# Patient Record
Sex: Male | Born: 1935 | Race: White | Hispanic: No | Smoking: Former smoker
Health system: Southern US, Community
[De-identification: ages and names within clinical notes are randomized; demographics above are authoritative.]

## PROBLEM LIST (undated history)

## (undated) DIAGNOSIS — I714 Abdominal aortic aneurysm, without rupture, unspecified: Secondary | ICD-10-CM

## (undated) DIAGNOSIS — R911 Solitary pulmonary nodule: Secondary | ICD-10-CM

## (undated) DIAGNOSIS — K219 Gastro-esophageal reflux disease without esophagitis: Secondary | ICD-10-CM

## (undated) DIAGNOSIS — N400 Enlarged prostate without lower urinary tract symptoms: Secondary | ICD-10-CM

## (undated) DIAGNOSIS — E785 Hyperlipidemia, unspecified: Secondary | ICD-10-CM

## (undated) DIAGNOSIS — I499 Cardiac arrhythmia, unspecified: Secondary | ICD-10-CM

## (undated) DIAGNOSIS — D649 Anemia, unspecified: Secondary | ICD-10-CM

## (undated) DIAGNOSIS — C4491 Basal cell carcinoma of skin, unspecified: Secondary | ICD-10-CM

## (undated) DIAGNOSIS — I2510 Atherosclerotic heart disease of native coronary artery without angina pectoris: Secondary | ICD-10-CM

## (undated) DIAGNOSIS — M1990 Unspecified osteoarthritis, unspecified site: Secondary | ICD-10-CM

## (undated) DIAGNOSIS — Z5189 Encounter for other specified aftercare: Secondary | ICD-10-CM

## (undated) DIAGNOSIS — Z950 Presence of cardiac pacemaker: Secondary | ICD-10-CM

## (undated) DIAGNOSIS — IMO0001 Reserved for inherently not codable concepts without codable children: Secondary | ICD-10-CM

## (undated) DIAGNOSIS — T4145XA Adverse effect of unspecified anesthetic, initial encounter: Secondary | ICD-10-CM

## (undated) DIAGNOSIS — Z923 Personal history of irradiation: Secondary | ICD-10-CM

## (undated) DIAGNOSIS — N2889 Other specified disorders of kidney and ureter: Secondary | ICD-10-CM

## (undated) DIAGNOSIS — K649 Unspecified hemorrhoids: Secondary | ICD-10-CM

## (undated) DIAGNOSIS — I350 Nonrheumatic aortic (valve) stenosis: Secondary | ICD-10-CM

## (undated) DIAGNOSIS — Z952 Presence of prosthetic heart valve: Secondary | ICD-10-CM

## (undated) DIAGNOSIS — K635 Polyp of colon: Secondary | ICD-10-CM

## (undated) DIAGNOSIS — T8859XA Other complications of anesthesia, initial encounter: Secondary | ICD-10-CM

## (undated) DIAGNOSIS — I10 Essential (primary) hypertension: Secondary | ICD-10-CM

## (undated) DIAGNOSIS — F32A Depression, unspecified: Secondary | ICD-10-CM

## (undated) HISTORY — DX: Personal history of irradiation: Z92.3

## (undated) HISTORY — PX: CARDIAC CATHETERIZATION: SHX172

## (undated) HISTORY — DX: Nonrheumatic aortic (valve) stenosis: I35.0

## (undated) HISTORY — DX: Basal cell carcinoma of skin, unspecified: C44.91

## (undated) HISTORY — DX: Encounter for other specified aftercare: Z51.89

## (undated) HISTORY — DX: Essential (primary) hypertension: I10

## (undated) HISTORY — DX: Atherosclerotic heart disease of native coronary artery without angina pectoris: I25.10

## (undated) HISTORY — DX: Gastro-esophageal reflux disease without esophagitis: K21.9

## (undated) HISTORY — DX: Reserved for inherently not codable concepts without codable children: IMO0001

## (undated) HISTORY — DX: Presence of cardiac pacemaker: Z95.0

## (undated) HISTORY — PX: BYPASS GRAFT ANGIOGRAPHY: CATH118229

## (undated) HISTORY — DX: Other specified disorders of kidney and ureter: N28.89

## (undated) HISTORY — DX: Depression, unspecified: F32.A

## (undated) HISTORY — DX: Unspecified osteoarthritis, unspecified site: M19.90

## (undated) HISTORY — DX: Abdominal aortic aneurysm, without rupture: I71.4

## (undated) HISTORY — DX: Hyperlipidemia, unspecified: E78.5

## (undated) HISTORY — DX: Polyp of colon: K63.5

## (undated) HISTORY — DX: Benign prostatic hyperplasia without lower urinary tract symptoms: N40.0

## (undated) HISTORY — PX: TONSILLECTOMY: SUR1361

## (undated) HISTORY — DX: Abdominal aortic aneurysm, without rupture, unspecified: I71.40

## (undated) HISTORY — DX: Solitary pulmonary nodule: R91.1

## (undated) HISTORY — DX: Anemia, unspecified: D64.9

---

## 1979-05-17 HISTORY — PX: NASAL HEMORRHAGE CONTROL: SHX287

## 1998-05-08 ENCOUNTER — Inpatient Hospital Stay (HOSPITAL_COMMUNITY): Admission: AD | Admit: 1998-05-08 | Discharge: 1998-05-10 | Payer: Self-pay | Admitting: Cardiology

## 1998-05-09 HISTORY — PX: CORONARY ANGIOPLASTY WITH STENT PLACEMENT: SHX49

## 1998-05-25 ENCOUNTER — Inpatient Hospital Stay (HOSPITAL_COMMUNITY): Admission: EM | Admit: 1998-05-25 | Discharge: 1998-05-27 | Payer: Self-pay | Admitting: Emergency Medicine

## 1998-12-07 ENCOUNTER — Ambulatory Visit (HOSPITAL_COMMUNITY): Admission: RE | Admit: 1998-12-07 | Discharge: 1998-12-08 | Payer: Self-pay | Admitting: Cardiology

## 1998-12-11 ENCOUNTER — Ambulatory Visit (HOSPITAL_COMMUNITY): Admission: RE | Admit: 1998-12-11 | Discharge: 1998-12-11 | Payer: Self-pay | Admitting: Pulmonary Disease

## 2001-01-14 ENCOUNTER — Encounter: Admission: RE | Admit: 2001-01-14 | Discharge: 2001-01-14 | Payer: Self-pay | Admitting: *Deleted

## 2002-01-08 ENCOUNTER — Emergency Department (HOSPITAL_COMMUNITY): Admission: EM | Admit: 2002-01-08 | Discharge: 2002-01-08 | Payer: Self-pay | Admitting: *Deleted

## 2002-01-08 ENCOUNTER — Encounter: Payer: Self-pay | Admitting: *Deleted

## 2002-09-15 HISTORY — PX: CARDIAC CATHETERIZATION: SHX172

## 2002-09-15 HISTORY — PX: INSERT / REPLACE / REMOVE PACEMAKER: SUR710

## 2003-04-20 ENCOUNTER — Inpatient Hospital Stay (HOSPITAL_COMMUNITY): Admission: EM | Admit: 2003-04-20 | Discharge: 2003-04-27 | Payer: Self-pay

## 2003-04-21 ENCOUNTER — Encounter: Payer: Self-pay | Admitting: Cardiology

## 2003-04-24 ENCOUNTER — Encounter: Payer: Self-pay | Admitting: Cardiology

## 2003-04-25 ENCOUNTER — Encounter: Payer: Self-pay | Admitting: Cardiology

## 2003-04-27 ENCOUNTER — Encounter: Payer: Self-pay | Admitting: Cardiology

## 2004-10-04 ENCOUNTER — Encounter: Admission: RE | Admit: 2004-10-04 | Discharge: 2004-10-04 | Payer: Self-pay | Admitting: Cardiology

## 2005-07-05 ENCOUNTER — Inpatient Hospital Stay (HOSPITAL_COMMUNITY): Admission: EM | Admit: 2005-07-05 | Discharge: 2005-07-06 | Payer: Self-pay | Admitting: Emergency Medicine

## 2006-01-14 ENCOUNTER — Encounter: Payer: Self-pay | Admitting: Emergency Medicine

## 2006-11-13 ENCOUNTER — Emergency Department (HOSPITAL_COMMUNITY): Admission: EM | Admit: 2006-11-13 | Discharge: 2006-11-13 | Payer: Self-pay | Admitting: Emergency Medicine

## 2008-05-03 ENCOUNTER — Emergency Department (HOSPITAL_COMMUNITY): Admission: EM | Admit: 2008-05-03 | Discharge: 2008-05-03 | Payer: Self-pay | Admitting: Emergency Medicine

## 2008-06-19 DIAGNOSIS — I2510 Atherosclerotic heart disease of native coronary artery without angina pectoris: Secondary | ICD-10-CM

## 2008-06-19 DIAGNOSIS — E785 Hyperlipidemia, unspecified: Secondary | ICD-10-CM

## 2008-06-19 DIAGNOSIS — D649 Anemia, unspecified: Secondary | ICD-10-CM | POA: Insufficient documentation

## 2008-06-19 DIAGNOSIS — E782 Mixed hyperlipidemia: Secondary | ICD-10-CM | POA: Insufficient documentation

## 2008-06-21 ENCOUNTER — Ambulatory Visit: Payer: Self-pay | Admitting: Internal Medicine

## 2008-06-21 DIAGNOSIS — Z860100 Personal history of colon polyps, unspecified: Secondary | ICD-10-CM | POA: Insufficient documentation

## 2008-06-21 DIAGNOSIS — R1012 Left upper quadrant pain: Secondary | ICD-10-CM | POA: Insufficient documentation

## 2008-06-21 LAB — CONVERTED CEMR LAB
Ferritin: 108.2 ng/mL (ref 22.0–322.0)
Iron: 99 ug/dL (ref 42–165)

## 2008-06-22 ENCOUNTER — Telehealth: Payer: Self-pay | Admitting: Internal Medicine

## 2008-06-27 ENCOUNTER — Ambulatory Visit: Payer: Self-pay | Admitting: Cardiovascular Disease

## 2009-02-20 ENCOUNTER — Inpatient Hospital Stay (HOSPITAL_COMMUNITY): Admission: EM | Admit: 2009-02-20 | Discharge: 2009-02-22 | Payer: Self-pay | Admitting: Emergency Medicine

## 2009-02-21 ENCOUNTER — Encounter: Payer: Self-pay | Admitting: Cardiology

## 2009-12-07 ENCOUNTER — Emergency Department (HOSPITAL_COMMUNITY)
Admission: EM | Admit: 2009-12-07 | Discharge: 2009-12-08 | Payer: Self-pay | Source: Home / Self Care | Admitting: Emergency Medicine

## 2010-04-30 ENCOUNTER — Ambulatory Visit: Payer: Self-pay | Admitting: Cardiology

## 2010-06-07 ENCOUNTER — Ambulatory Visit: Payer: Self-pay | Admitting: Cardiology

## 2010-07-20 ENCOUNTER — Encounter (INDEPENDENT_AMBULATORY_CARE_PROVIDER_SITE_OTHER): Payer: Self-pay | Admitting: *Deleted

## 2010-07-29 ENCOUNTER — Encounter: Payer: Self-pay | Admitting: Internal Medicine

## 2010-07-29 ENCOUNTER — Ambulatory Visit: Payer: Self-pay

## 2010-10-17 NOTE — Assessment & Plan Note (Signed)
Summary: fatigue   Current Medications (verified): 1)  Cardura 4 Mg Tabs (Doxazosin Mesylate) .Marland Kitchen.. 1 Tablet By Mouth Once Daily 2)  Plavix 75 Mg Tabs (Clopidogrel Bisulfate) .Marland Kitchen.. 1 Tablet By Mouth Once Daily 3)  Ferrous Sulfate 325 (65 Fe) Mg Tbec (Ferrous Sulfate) .Marland Kitchen.. 1 Tablet By Mouth Once Daily 4)  Crestor 5 Mg Tabs (Rosuvastatin Calcium) .Marland Kitchen.. 1 Tablet By Mouth Once Daily 5)  Norvasc 5 Mg Tabs (Amlodipine Besylate) .Marland Kitchen.. 1 Tablet By Mouth Once Daily 6)  Benicar 20 Mg Tabs (Olmesartan Medoxomil) .Marland Kitchen.. 1 Tablet By Mouth Once Daily 7)  Nexium 40 Mg Cpdr (Esomeprazole Magnesium) .... Take 1 Capsule By Mouth Once Daily  Allergies (verified): 1)  ! Aspirin   PPM Specifications Following MD:  [name], MD     Referring MD:  [name] PPM Vendor:  Medtronic     PPM Model Number:  [id number]     PPM Serial Number:  [id number] PPM DOI:  04/26/2003     PPM Implanting MD:  [name]  Lead 1    Location: RA     DOI: 04/26/2003     Model #: [id number]     Serial #: [id number]     Status: active Lead 2    Location: RV     DOI: 04/26/2003     Model #: 4470     Serial #: [id number]     Status: active  Magnet Response Rate:  BOL 85 ERI 65  Indications:  [name]   PPM Follow Up Battery Voltage:  2.69 V     Battery Est. Longevity:  14 mths       PPM Device Measurements Atrium  Amplitude: 2.00 mV, Impedance: 518 ohms, Threshold: 0.50 V at 0.40 msec Right Ventricle  Amplitude: 11.20 mV, Impedance: 487 ohms, Threshold: 0.750 V at 0.40 msec  Episodes MS Episodes:  138     Percent Mode Switch:  <0.1%     Ventricular High Rate:  0     Atrial Pacing:  46.1%     Ventricular Pacing:  99.2%  Parameters Mode:  DDDR     Lower Rate Limit:  60     Upper Rate Limit:  130 Paced AV Delay:  250     Sensed AV Delay:  140 Next Cardiology Appt Due:  10/16/2010 Tech Comments:  PT HAVING PROBLEMS W/FATIGUE TODAY.  PT HAD BEEN VERY ACTIVE DOING SEVERAL CHORES. PT FEELS LIKE BEFORE STENT WAS PLACED.  INFORMED PT  TO CALL [place] CARD IF SYMPTOMS PERSIST.  138 AHR EPISODES--LONGEST WAS 12 MINUTES 50 SECONDS.  NORMAL DEVICE FUNCTION.  NO CHANGES MADE. ROV IN 3 MTHS W/GT. PT HAS CARELINK TRANSMITTER AND WILL START USING AFTER GT Theadora Rama  July 29, 2010 2:17 PM

## 2010-10-17 NOTE — Cardiovascular Report (Signed)
Summary: Office Visit   Office Visit   Imported By: Roderic Ovens 07/30/2010 16:59:52  _____________________________________________________________________  External Attachment:    Type:   Image     Comment:   External Document

## 2010-10-17 NOTE — Miscellaneous (Signed)
Summary: Device preload  Clinical Lists Changes  Observations: Added new observation of PPM INDICATN: Brady (07/20/2010 11:15) Added new observation of MAGNET RTE: BOL 85 ERI 65 (07/20/2010 11:15) Added new observation of PPMLEADSTAT2: active (07/20/2010 11:15) Added new observation of PPMLEADSER2: [id number]  (07/20/2010 11:15) Added new observation of PPMLEADMOD2: 4470  (07/20/2010 11:15) Added new observation of PPMLEADDOI2: 04/26/2003  (07/20/2010 11:15) Added new observation of PPMLEADLOC2: RV  (07/20/2010 11:15) Added new observation of PPMLEADSTAT1: active  (07/20/2010 11:15) Added new observation of PPMLEADSER1: 811914  (07/20/2010 11:15) Added new observation of PPMLEADMOD1: 4469  (07/20/2010 11:15) Added new observation of PPMLEADDOI1: 04/26/2003  (07/20/2010 11:15) Added new observation of PPMLEADLOC1: RA  (07/20/2010 11:15) Added new observation of PPM DOI: 04/26/2003  (07/20/2010 11:15) Added new observation of PPM SERL#: [id number]  (07/20/2010 11:15) Added new observation of PPM MODL#: [id number]  (07/20/2010 46:96) Added new observation of PACEMAKERMFG: Medtronic  (07/20/2010 11:15) Added new observation of PPM IMP MD: Duffy Rhody Tennant,MD  (07/20/2010 11:15) Added new observation of PPM REFER MD: [name]  (07/20/2010 11:15) Added new observation of PACEMAKER MD: [name], MD  (07/20/2010 11:15)      PPM Specifications Following MD:  [name], MD     Referring MD:  [name] PPM Vendor:  Medtronic     PPM Model Number:  [id number]     PPM Serial Number:  [id number] H PPM DOI:  04/26/2003     PPM Implanting MD:  [name]  Lead 1    Location: RA     DOI: 04/26/2003     Model #: 4469     Serial #: [id number]     Status: active Lead 2    Location: RV     DOI: 04/26/2003     Model #: 4470     Serial #: [id number]     Status: active  Magnet Response Rate:  BOL 85 ERI 65  Indications:  Jordan [name]

## 2010-10-29 ENCOUNTER — Encounter: Payer: Self-pay | Admitting: Internal Medicine

## 2010-10-29 ENCOUNTER — Encounter (INDEPENDENT_AMBULATORY_CARE_PROVIDER_SITE_OTHER): Payer: Medicare Other | Admitting: Internal Medicine

## 2010-10-29 DIAGNOSIS — I495 Sick sinus syndrome: Secondary | ICD-10-CM

## 2010-10-29 DIAGNOSIS — E782 Mixed hyperlipidemia: Secondary | ICD-10-CM

## 2010-10-29 DIAGNOSIS — Z950 Presence of cardiac pacemaker: Secondary | ICD-10-CM | POA: Insufficient documentation

## 2010-10-29 DIAGNOSIS — I10 Essential (primary) hypertension: Secondary | ICD-10-CM

## 2010-11-06 NOTE — Assessment & Plan Note (Signed)
Summary: PC2 PER CK OUT/ FROM PACER ON 11/14/LG=MJ   Visit Type:  device check Primary Provider:  Jarome Matin, MD  CC:  pt has no complaints today.  History of Present Illness: Jordan [name] is referred today for ongoing PPM followup. He is a pleasant elderly man who remains quite active despite his advanced age. He has CAD and is s/p stenting. 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 He denies c/p or sob. No edema. He continues to shoot less than his age in golf. No syncope. He notes that he cannont go up a hill fast.  Current Medications (verified): 1)  Cardura 4 Mg Tabs (Doxazosin Mesylate) .Marland Kitchen.. 1 Tablet By Mouth Once Daily 2)  Plavix 75 Mg Tabs (Clopidogrel Bisulfate) .Marland Kitchen.. 1 Tablet By Mouth Once Daily 3)  Ferrous Sulfate 325 (65 Fe) Mg Tbec (Ferrous Sulfate) .Marland Kitchen.. 1 Tablet By Mouth Once Daily 4)  Crestor 5 Mg Tabs (Rosuvastatin Calcium) .Marland Kitchen.. 1 Tablet By Mouth Once Daily 5)  Norvasc 5 Mg Tabs (Amlodipine Besylate) .Marland Kitchen.. 1 Tablet By Mouth Once Daily 6)  Benicar 20 Mg Tabs (Olmesartan Medoxomil) .Marland Kitchen.. 1 Tablet By Mouth Once Daily 7)  Nexium 40 Mg Cpdr (Esomeprazole Magnesium) .... Take 1 Capsule By Mouth Once Daily  Allergies (verified): 1)  ! Aspirin  Past History:  Past Medical History: Last updated: 06/19/2008 Current Problems:  CAD (ICD-414.00) ANEMIA (ICD-285.9) HYPERLIPIDEMIA (ICD-272.4)  Past Surgical History: Last updated: 06/21/2008 Pacemaker PTCA-Stent  Family History: Last updated: 06/21/2008 Family History of Stomach Cancer:Father? Family History of Heart Disease: Brothers x2  Social History: Last updated: 06/21/2008 Occupation: Retired Patient is a former smoker. quit 1975 Alcohol Use - no Daily Caffeine Use 2 weekly Illicit Drug Use - no  Risk Factors: Smoking Status: quit (06/21/2008)  Review of Systems       All systems reviewed and negative except as noted in the HPI.  Vital Signs:  Patient profile:   77 year old male Height:      64  inches Weight:      170 pounds BMI:     29.29 Pulse rate:   96 / minute Resp:     18 per minute BP sitting:   132 / 71  (left arm) Cuff size:   regular  Vitals Entered By: Celestia Khat, CMA (October 29, 2010 4:01 PM)  Physical Exam  General:  Well developed, well nourished, no acute distress. Head:  Normocephalic and atraumatic. Eyes:  PERRLA, no icterus. Mouth:  No deformity or lesions, dentures present Neck:  Supple; no masses or thyromegaly. Chest Wall:  Well healed PPM incision. Lungs:  Clear throughout with no wheezes, rales, or rhonchi. Heart:  RRR with normal S1 and S2. He has both an aortic sclerosis and mitral regurge murmur. Abdomen:  Soft, nontender and nondistended. No masses, hepatosplenomegaly or hernias noted. Normal bowel sounds. Msk:  Symmetrical with no gross deformities. Normal posture. Pulses:  Normal pulses noted. Extremities:  No clubbing, cyanosis, edema or deformities noted. Neurologic:  Alert and  oriented x4;  grossly normal neurologically.   PPM Specifications Following MD:  [name], MD     Referring MD:  [name] PPM Vendor:  Medtronic     PPM Model Number:  [name]     PPM Serial Number:  [name] PPM DOI:  04/26/2003     PPM Implanting MD:  [name]  Lead 1    Location: RA     DOI: 04/26/2003     Model #: [id number]  Serial #: [id number]     Status: active Lead 2    Location: RV     DOI: 04/26/2003     Model #: 4470     Serial #: [id number]     Status: active  Magnet Response Rate:  BOL 85 ERI 65  Indications:  [name]   Parameters Mode:  DDDR     Lower Rate Limit:  60     Upper Rate Limit:  130 Paced AV Delay:  250     Sensed AV Delay:  140 MD Comments:  Normal device function.  Impression & Recommendations:  Problem # 1:  CARDIAC PACEMAKER IN SITU (ICD-V45.01) His device is working normally though he is approaching ERI in 6-12 months.  Problem # 2:  CAD (ICD-414.00) He denies anginal symptoms. Will follow. His  updated medication list for this problem includes:    Plavix 75 Mg Tabs (Clopidogrel bisulfate) .Marland Kitchen... 1 tablet by mouth once daily    Norvasc 5 Mg Tabs (Amlodipine besylate) .Marland Kitchen... 1 tablet by mouth once daily  Problem # 3:  HYPERLIPIDEMIA (ICD-272.4) He will continue on low dose crestor and a low fat diet. His updated medication list for this problem includes:    Crestor 5 Mg Tabs (Rosuvastatin calcium) .Marland Kitchen... 1 tablet by mouth once daily  Patient Instructions: 1)  Your physician wants you to follow-up in:  12 months with Dr Court Joy will receive a reminder letter in the mail two months in advance. If you don't receive a letter, please call our office to schedule the follow-up appointment. 2)  [facility] transmisssion on 01/30/11 3)  Your physician recommends that you continue on your current medications as directed. Please refer to the Current Medication list given to you today.

## 2010-11-07 ENCOUNTER — Ambulatory Visit (INDEPENDENT_AMBULATORY_CARE_PROVIDER_SITE_OTHER): Payer: Medicare Other | Admitting: Cardiology

## 2010-11-07 DIAGNOSIS — Z9861 Coronary angioplasty status: Secondary | ICD-10-CM

## 2010-11-07 DIAGNOSIS — I359 Nonrheumatic aortic valve disorder, unspecified: Secondary | ICD-10-CM

## 2010-11-07 DIAGNOSIS — I2510 Atherosclerotic heart disease of native coronary artery without angina pectoris: Secondary | ICD-10-CM

## 2010-11-12 NOTE — Cardiovascular Report (Signed)
Summary: Office Visit   Office Visit   Imported By: Roderic Ovens 11/06/2010 15:49:46  _____________________________________________________________________  External Attachment:    Type:   Image     Comment:   External Document

## 2010-12-23 LAB — POCT CARDIAC MARKERS
CKMB, poc: <1 ng/mL — ABNORMAL LOW (ref 1.0–8.0)
Troponin i, poc: <0.05 ng/mL (ref 0.00–0.09)

## 2010-12-23 LAB — CBC
HCT: 29.7 % — ABNORMAL LOW (ref 39.0–52.0)
HCT: 30.6 % — ABNORMAL LOW (ref 39.0–52.0)
Hemoglobin: 10.1 g/dL — ABNORMAL LOW (ref 13.0–17.0)
Hemoglobin: 11.4 g/dL — ABNORMAL LOW (ref 13.0–17.0)
MCHC: 33.7 g/dL (ref 30.0–36.0)
MCHC: 34.1 g/dL (ref 30.0–36.0)
MCV: 92.4 fL (ref 78.0–100.0)
MCV: 93.2 fL (ref 78.0–100.0)
MCV: 93.9 fL (ref 78.0–100.0)
Platelets: 158 K/uL (ref 150–400)
Platelets: 166 10*3/uL (ref 150–400)
Platelets: 182 10*3/uL (ref 150–400)
RBC: 3.18 MIL/uL — ABNORMAL LOW (ref 4.22–5.81)
RDW: 13.1 % (ref 11.5–15.5)
WBC: 6.1 10*3/uL (ref 4.0–10.5)
WBC: 8.2 K/uL (ref 4.0–10.5)

## 2010-12-23 LAB — DIFFERENTIAL
Basophils Relative: 0 % (ref 0–1)
Monocytes Absolute: 0.4 10*3/uL (ref 0.1–1.0)
Monocytes Relative: 6 % (ref 3–12)
Neutro Abs: 4.3 10*3/uL (ref 1.7–7.7)
Neutrophils Relative %: 65 % (ref 43–77)

## 2010-12-23 LAB — COMPREHENSIVE METABOLIC PANEL
ALT: 17 U/L (ref 0–53)
AST: 20 U/L (ref 0–37)
Albumin: 3.4 g/dL — ABNORMAL LOW (ref 3.5–5.2)
Alkaline Phosphatase: 39 U/L (ref 39–117)
Calcium: 8.9 mg/dL (ref 8.4–10.5)
GFR calc Af Amer: >60 mL/min (ref >60)
GFR calc non Af Amer: >60 mL/min (ref >60)
Potassium: 4.6 mEq/L (ref 3.5–5.1)
Sodium: 140 mEq/L (ref 135–145)
Total Bilirubin: 0.6 mg/dL (ref 0.3–1.2)
Total Protein: 7.3 g/dL (ref 6.0–8.3)

## 2010-12-23 LAB — CARDIAC PANEL(CRET KIN+CKTOT+MB+TROPI)
CK, MB: 1.1 ng/mL (ref 0.3–4.0)
Relative Index: INVALID (ref 0.0–2.5)
Total CK: 45 U/L (ref 7–232)

## 2010-12-23 LAB — POCT I-STAT, CHEM 8
BUN: 24 mg/dL — ABNORMAL HIGH (ref 6–23)
Creatinine, Ser: 1.4 mg/dL (ref 0.4–1.5)

## 2010-12-23 LAB — CK TOTAL AND CKMB (NOT AT ARMC)
CK, MB: 1.4 ng/mL (ref 0.3–4.0)
Relative Index: INVALID (ref 0.0–2.5)

## 2010-12-23 LAB — TSH: TSH: 0.747 u[IU]/mL (ref 0.350–4.500)

## 2010-12-23 LAB — LIPASE, BLOOD: Lipase: 16 U/L (ref 11–59)

## 2010-12-23 LAB — PROTIME-INR: Prothrombin Time: 14.5 seconds (ref 11.6–15.2)

## 2010-12-23 LAB — APTT: aPTT: 34 seconds (ref 24–37)

## 2010-12-23 LAB — MAGNESIUM: Magnesium: 2.3 mg/dL (ref 1.5–2.5)

## 2011-01-28 NOTE — H&P (Signed)
Jordan [name], Jordan [name]             ACCOUNT NO.:  [id number]   MEDICAL RECORD NO.:  [id number]          PATIENT TYPE:  INP   LOCATION:  3705                         FACILITY:  [facility]   PHYSICIAN:  Colleen Can. Deborah Chalk, M.D.DATE OF BIRTH:  05/29/1918   DATE OF ADMISSION:  02/20/2009  DATE OF DISCHARGE:                              HISTORY & PHYSICAL   CHIEF COMPLAINT:  Chest pain.   HISTORY OF PRESENT ILLNESS:  Mr. Holderness is a very pleasant 77 year old  white male who presents to the emergency room with chest pain.  He does  have multiple medical problems.  He presents to the emergency room  department with complaints of a pressure-like sensation over the left  side of his chest.  He notes that it started at about midnight and it  lasted for only 4-6 seconds.  He was able to sleep okay.  When he got up  to eat breakfast this morning, he felt a little nauseated.  He had to  put a cold rag to the back of his neck.  He was not really lightheaded  or dizzy.  He continued to have this chest discomfort and overall just  did not feel right.  He came to the emergency room where he was  subsequently seen and admitted for further evaluation.   PAST MEDICAL HISTORY:  1. Known coronary artery disease.  He had remote stent placement to      the LAD in August 1999.  He had catheterization in 1999 with      question of a thrombus versus a spasm of the LAD with a focal      dissection.  His last catheterization was in 2004.  At that time,      his ejection fraction was 65%.  The stent in the proximal LAD was      patent, the first diagonal was small with a 95% stenosis and not      suitable for PCI, there was a 30% proximal left circumflex stenosis      and a 50% proximal right coronary artery stenosis.  He has been      managed medically since that time.  He had his last stress study in      October 2006 which was normal.  2. Hypertension.  3. BPH.  4. Hyperlipidemia.  5. Aortic stenosis.  His  last 2-D echocardiogram was in August 2009      which showed an aortic valve area of 1.07 cm2.  6. Colonic polyps.  7. History of anemia, on iron replacement.  8. Basal cell skin cancer.   ALLERGIES:  ASPIRIN causes GI upset.   CURRENT MEDICINES:  1. Benicar 20 mg a day.  2. Iron tablet daily.  3. Crestor 5 mg a day.  4. Norvasc 5 mg a day.  5. Plavix 75 mg a day.  6. Cardura 4 mg a day.   FAMILY HISTORY:  His father died at 67 and had a pacemaker.  His mother  lived up into her 38s.  He has had 2 brothers, both have had bypass  surgery.   SOCIAL  HISTORY:  He is married.  He has had no tobacco products for over  35 years and no alcohol use.  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   REVIEW OF SYSTEMS:  Previously had been doing well with really no  complaints.  No recent fever, flu, chills, or cough.  He has had no  problems with his appetite.  No dysphasia.  He denies any belching or  burping.  No real abdominal discomfort.  No trouble passing his urine.  He is not really short of breath.  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  All other review of systems are  negative.   PHYSICAL EXAMINATION:  GENERAL:  He is a very pleasant elderly white  male who looks younger than his stated age.  He is in no acute distress  and he is currently pain free.  SKIN:  Warm and dry.  Color is unremarkable.  VITAL SIGNS:  His blood pressure was 130/70, heart rate was 66, O2  saturation was 99%.  HEENT:  Normocephalic, atraumatic.  Pupils are equal and reactive.  Oropharynx was clear.  NECK:  Supple without masses and no JVD.  LUNGS:  Clear.  He had a little congestion in the right base.  CARDIAC:  Regular rhythm with outflow murmur.  ABDOMEN:  Soft.  Positive bowel sounds.  Nontender.  EXTREMITIES:  Without edema.  NEUROLOGIC:  No gross focal deficits.  MUSCULOSKELETAL:  The strength to be equal and symmetric.  His range of  motion was intact.   Gait was not tested.   PERTINENT LABORATORY DATA:  CBC shows hemoglobin of 10, hematocrit is  30, white count is 6000, platelets are 166.  Chemistry showed sodium  135, potassium 3.5, chloride 96, BUN 6, creatinine 0.4, and a glucose of  70.  His EKG shows a paced rhythm.  His cardiac markers were negative.  His chest x-ray, which was a portable film, showed a small left pleural  effusion with basilar airspace disease, likely compressed atelectasis  with cardiomegaly and pulmonary vascular congestion.   OVERALL IMPRESSION:  1. Chest pain, atypical.  2. Coronary artery disease with remote stent placement.  3. Aortic stenosis.  4. Hyperlipidemia.  5. Hypertension.  6. Advanced age.   PLAN:  He will be admitted.  We will place him on IV nitroglycerin and  heparin.  We will check serial enzymes.  We will continue his home  medicines and will add proton pump inhibitor.  He will have a  gallbladder ultrasound as well as a 2-D echocardiogram.  Hopefully, we  will be able to avoid cardiac catheterization. Further treatment plan  per Dr. Deborah Chalk and Dr. Elvis Coil discretion.      Sharlee Blew, N.P.      Colleen Can. Deborah Chalk, M.D.  Electronically Signed    LC/MEDQ  D:  02/20/2009  T:  02/21/2009  Job:  [id number]

## 2011-01-30 ENCOUNTER — Other Ambulatory Visit: Payer: Self-pay | Admitting: Internal Medicine

## 2011-01-30 ENCOUNTER — Ambulatory Visit (INDEPENDENT_AMBULATORY_CARE_PROVIDER_SITE_OTHER): Payer: Medicare Other | Admitting: *Deleted

## 2011-01-30 DIAGNOSIS — I441 Atrioventricular block, second degree: Secondary | ICD-10-CM

## 2011-01-31 NOTE — H&P (Signed)
Jordan [name], Jordan [name]                         ACCOUNT NO.:  [id number]   MEDICAL RECORD NO.:  [id number]                   PATIENT TYPE:  INP   LOCATION:  3705                                 FACILITY:  [facility]   PHYSICIAN:  Gwen Pounds, M.D.                 DATE OF BIRTH:  [birth year]   DATE OF ADMISSION:  04/20/2003  DATE OF DISCHARGE:                                HISTORY & PHYSICAL   CHIEF COMPLAINT:  Unstable angina, fatigue, and dizzy.   PHYSICIANS:  1. Colleen Can. Deborah Chalk, M.D., primary cardiologist.  2. Ulyess Mort, M.D., primary gastroenterologist.   HISTORY OF PRESENT ILLNESS:  An 77 year old male with coronary artery  disease with percutaneous intervention in 1999 by Dr. Colleen Can. Deborah Chalk  with a LAD catheterization in March of 2000.  Patent LAD but there is a 60%  narrowing of the stent felt to be stable.  He had a stress Cardiolite which  was negative in September of 2002 with an EF of 69%.  He presented today to  the office with three to four weeks of worsening symptoms of easy fatigue.  He now describes getting tired with worsening feeling in his chest when he  exerts himself of approximately 10 minutes or more.  This is highly unusual  for him. The last time he had this he required the catheter percutaneous  intervention.  He also describes some dizziness.   PAST MEDICAL HISTORY:  1. Coronary artery disease, status post percutaneous intervention of the     left anterior descending artery.  2. Hyperlipidemia.  3. Benign prostatic hypertrophy.  4. Aortic outflow murmur.  5. Colon polyps.  6. Carotid bruit with negative workup in 2002.  7. Anemia.  8. Hypertension.  9. Basal cell skin cancer.   SOCIAL HISTORY:  He is married.  He is retired.  He quit smoking in 1975.  He quit chewing in May of 2004.   ALLERGIES:  ASPIRIN gives him a GI upset.   MEDICATIONS:  1. Prinivil 10 mg q.d.  2. Cardura 8.5 mg p.o. q.h.s.  3. Lipitor 10 mg q.d.  4. Imdur 60  mg q.d.   FAMILY HISTORY:  Colon cancer, longevity, and coronary disease.   REVIEW OF SYMPTOMS:  He denies any nausea, vomiting, diarrhea, melena.  He  has some left chest soreness with exertion.  Mild diaphoresis.  No shortness  of breath noted.  No dyspnea on exertion.  No depression.  No anxiety.  He  is getting his face worked on by Dr. Elinor Parkinson. Lupton for basal cell skin  cancer.   PHYSICAL EXAMINATION:  VITAL SIGNS:  Weight 169 pounds.  Blood pressure  96/50, heart rate is 100.  HEENT:  PERRL.  EOMI.  Oropharynx moist.  CARDIOVASCULAR:  Regular with a murmur noted.  NECK:  Positive bruit.  No JVD.  ABDOMEN:  Soft.  EXTREMITIES:  No edema.  PULSES:  Dorsalis pedis pulse is 2+ bilaterally.   LABORATORY DATA:  Currently pending.  EKG shows it is difficult to determine  the rhythm.  It appears either as a significant first degree AV block versus  Wenckebach.  No acute ST changes consistent with an acute MI.   ASSESSMENT:  1. This is an elderly male with a history of coronary disease who presents     to the office with progressive unstable angina.  We will send him to the     emergency room for evaluation by cardiology to see if he needs to go to     the catheter lab.  2. We will place him on heparin drip.  3. We will give Plavix.  4. We will give bed side service with cardiology consultation.  5. Rule out myocardial infarction.  6. May need heart catheterization.  7. Oxygen.                                                Gwen Pounds, M.D.    JMR/MEDQ  D:  04/20/2003  T:  04/20/2003  Job:  [id number]   cc:   Colleen Can. Deborah Chalk, M.D.  [address], [address]  [place]  [place] [address]  Fax: [phone]   Ulyess Mort, M.D. [facility]

## 2011-01-31 NOTE — Consult Note (Signed)
Jordan [name], Jordan [name] NO.:  [id number]   MEDICAL RECORD NO.:  [id number]                   PATIENT TYPE:  INP   LOCATION:  1844                                 FACILITY:  [facility]   PHYSICIAN:  Peter M. Swaziland, M.D.               DATE OF BIRTH:  Apr 30, 1918   DATE OF CONSULTATION:  04/20/2003  DATE OF DISCHARGE:                                   CONSULTATION   HISTORY:  Mr. Buntrock is an 77 year old white male with a history of  coronary artery disease, status post stenting of the proximal LAD, September  1999.  He had a repeat cardiac catheterization approximately one week later  after he presented with recurrent chest pain with mild ST elevation; there  was a focal edge detection noted but no flow-limiting lesions.  He  subsequently underwent repeat catheterization in March 2000, which showed a  40 to 60% restenosis within the stent; there was also a 50% stenosis in the  proximal right coronary artery.  He has been treated medically since then,  and his last stress Cardiolite study in September 2002 was negative.  He now  presents with a three-week history of progressive exertional symptoms with  tightness across his chest; this is relieved with rest.  He notices it when  playing golf, mowing his yard, or walking.  He had similar symptoms prior to  his stent.  No true pain.  ECG at [facility] today showed  Wenckebach AV block without acute ST- or T-wave changes.   PAST MEDICAL HISTORY:  1. ASCAD, status post stenting of the LAD.  2. Hypercholesterolemia.  3. Hypertension.  4. History of anemia.  5. BPH.  6. History of colon polyps.   ALLERGIES:  ASPIRIN, which causes GI upset with bleeding.   CURRENT MEDICATIONS:  1. Prinivil 10 mg per day.  2. Lipitor 10 mg per day.  3. Cardura 4 mg q.h.s.  4. Isosorbide 60 mg per day.   SOCIAL HISTORY:  The patient is married.  He is retired.  He is a remote  smoker, quitting  approximately 40 years ago.  No history of alcohol use.   FAMILY HISTORY:  Father died and had a pacemaker.  He has two brothers who  have had coronary artery bypass surgery.   REVIEW OF SYSTEMS:  Otherwise unremarkable.   PHYSICAL EXAMINATION:  GENERAL:  This is a pleasant white male in no  apparent distress.  VITAL SIGNS:  Blood pressure 150/100, pulse 71 and slightly irregular,  respirations are normal.  HEENT EXAM:  Conjunctivae are clear.  Oropharynx is clear.  NECK:  Supple without JVD, adenopathy, thyromegaly, or bruits.  LUNGS:  Clear.  CARDIAC EXAM:  Reveals a soft systolic ejection murmur in the aortic area  without S3.  ABDOMEN:  Obese, soft, and nontender without masses or bruits.  EXTREMITIES:  Without edema.  Pulses are 2+ and symmetric.  NEUROLOGIC EXAM:  Intact.   LABORATORY DATA:  ECG shows normal sinus rhythm with a second-degree AV  block in a Wenckebach pattern; otherwise normal.   IMPRESSION:  1. Progressive angina.  2. A second-degree atrioventricular block, Wenckebach.  3. Atherosclerotic coronary artery disease, status post stenting of the left     anterior descending.  4. Hypertension.  5. Hypercholesterolemia.  6. ASPIRIN intolerance.   PLAN:  Admit and rule out for myocardial infarction.  He will be treated  with IV heparin.  I would avoid beta blockers due to his AV block.  We will  start Plavix.  We may need to consider repeat cardiac catheterization  tomorrow.                                               Peter M. [place], M.D.    PMJ/MEDQ  D:  04/20/2003  T:  04/21/2003  Job:  [id number]   cc:   Colleen Can. Deborah Chalk, M.D.  [address], [address]  [place]  [place] [address]  Fax: [phone]   Barry Dienes. Eloise Harman, M.D.  [address]  [place]  [place] [address]  Fax: [phone]   Gwen Pounds, M.D.  [address]  [place]  [place] [address]  Fax: [phone]

## 2011-01-31 NOTE — H&P (Signed)
Jordan [name]             ACCOUNT NO.:  [id number]   MEDICAL RECORD NO.:  [id number]          PATIENT TYPE:  INP   LOCATION:  2027                         FACILITY:  [facility]   PHYSICIAN:  Colleen Can. Deborah Chalk, M.D.DATE OF BIRTH:  Aug 28, 1918   DATE OF ADMISSION:  07/05/2005  DATE OF DISCHARGE:  07/06/2005                                HISTORY & PHYSICAL   CHIEF COMPLAINT:  Chest pain.   HISTORY OF PRESENT ILLNESS:  Jordan [name] is an 77 year old white male.  He  has a known history of ischemic heart disease.  He has functioning pacemaker  in place and he has known mild aortic stenosis.  He presents to the [facility]  Emergency Room Department on July 05, 2005 per EMS after having an  episode of chest pain.  He has basically been in his usual state of health  up until earlier today when he went outside to get the newspaper.  At that  time he had chest tightness, diaphoresis.  There was no radiation of the  discomfort.  He did have some slight nausea.  He went back into the house  and felt the need to have a bowel movement and his symptoms basically  abated, yet only to return.  He subsequently called EMS.  He was noted to be  tachycardic upon their arrival.  He is currently painfree in the emergency  room.   PAST MEDICAL HISTORY:  1.  Known ischemic heart disease.  He had previous angioplasty to the LAD in      March 2000.  His last catheterization was in August of 2004 with a 40%      narrowing in the right coronary.  The left main was normal.  He had      irregularities of the LAD and left circumflex.  There was a 10 mm      gradient across the aortic valve.  2.  Hyperlipidemia.  3.  Mild aortic stenosis.  4.  Anemia.  5.  Hypertension.  6.  History of AV block status post dual chamber pacemaker implantation in      August of 2004.  7.  History of carotid bruit.  8.  History of colonic polyps.  9.  History of basal cell skin cancer.  10. BPH.  11. Lymphedema of the right  arm.   ALLERGIES:  ASPIRIN causes GI upset.   CURRENT MEDICATIONS:  1.  Lipitor 10 mg a day.  2.  Cardura 4 mg a day.  3.  Prinivil 10 mg a day.  4.  Plavix 75 mg a day.   FAMILY HISTORY:  Noncontributory.   SOCIAL HISTORY:  He is married.  He is retired.  He has had no smoking  history since 1975.   REVIEW OF SYSTEMS:  He has basically been doing well up until the day of  admission.  He did have a cortisone injection in his right knee on Friday.  He has had no fever, no chills, no abdominal pain, no constipation, no  diarrhea.   PHYSICAL EXAMINATION:  GENERAL:  He is currently in no acute distress.  He  is alert and oriented and painfree.  VITAL SIGNS:  Blood pressure 130/68, heart rate is in the 90s, temperature  is 98.5.  SKIN:  Warm and dry.  Color is unremarkable.  LUNGS:  Show a few rales.  CARDIAC:  Grade 2/6 murmur of aortic stenosis.  ABDOMEN:  Soft, positive bowel sounds, nontender.  EXTREMITIES:  Without edema.  NEUROLOGIC:  Intact.  There are no gross focal deficits.   His cardiac markers are basically negative; however, the myoglobins are  slightly elevated in the 300 range.  Chemistries show a BUN of 20,  creatinine is 1.5, potassium 3.7, glucose is 186.  CBC is normal except for  a white count of 16,000.   EKG shows paced rhythm.   OVERALL IMPRESSION:  1.  Atypical chest pain, currently painfree.  2.  Known history of ischemic heart disease.  3.  History of aortic stenosis.  4.  Hypertension.  5.  Hyperlipidemia.  6.  Benign prostatic hypertrophy.  7.  Gastroesophageal reflux disease.   PLAN:  He will be admitted to telemetry.  Will check serial cardiac panels.  Will start him on PPI therapy.  His home medicines will be continued.  The  further treatment plan to follow up per Dr. Ronnald [name] discretion.      Jordan [name], N.P.      Colleen Can. Deborah Chalk, M.D.  Electronically Signed    LC/MEDQ  D:  07/06/2005  T:  07/06/2005  Job:  [id number]    cc:   Gwen Pounds, MD  Fax: [phone]

## 2011-01-31 NOTE — Discharge Summary (Signed)
NAMEHULAN, SZUMSKI NO.:  [id number]   MEDICAL RECORD NO.:  [id number]          PATIENT TYPE:  INP   LOCATION:  3730                         FACILITY:  [facility]   PHYSICIAN:  Peter M. Swaziland, M.D.  DATE OF BIRTH:  [birth year]   DATE OF ADMISSION:  02/20/2009  DATE OF DISCHARGE:  02/22/2009                               DISCHARGE SUMMARY   HISTORY OF PRESENT ILLNESS:  [name] is an 77 year old white male  with multiple medical problems who presented for evaluation of chest  pain.  He describes as a pressure buildup.  It lasts only 4-5 seconds  and associated with some nausea.  For details of his past medical  history, social history, family history and physical exam please see  admission history and physical.   LABORATORY DATA:  White count of 6100, hemoglobin 10.6, hematocrit 30.6,  platelets 166,000.  Coags were normal.  Sodium 141, potassium 4.3,  chloride 106, glucose of 122, BUN 24, creatinine 1.12, calcium is 8.9.  Liver function studies were all normal.  Serial cardiac enzymes were  negative x3.  TSH was 0.747.  Chest x-ray showed stable cardiomegaly  with mild pulmonary vascular congestion.  ECG showed electronic  ventricular pacemaker with sinus rhythm.   HOSPITAL COURSE:  The patient was admitted to telemetry monitor.  He was  initially started on IV nitroglycerin and heparin.  He had no subsequent  chest pain.  He did have some atypical gas and belching.  After he ruled  out for myocardial infarctions nitroglycerin and heparin were  discontinued.  He did have an abdominal ultrasound which demonstrated no  gallstones or ductal dilatation.  His kidneys showed chronic changes  consistent with chronic renal medical disease.  There was no  hydronephrosis.  He had a stable infrarenal abdominal aortic aneurysm  measuring 3.7 x 3.5 cm.  He also had an echocardiogram which  demonstrated mild concentric LVH with normal systolic function ejection  fraction of  60-65%.  There was moderate aortic stenosis with a valve  area of 1.5 cm2, mean gradient was 20 mmHg.  This was unchanged from his  prior evaluation.  The patient was ambulated.  He had no subsequent  problems and was discharged home on February 22, 2009 in stable condition.  We recommended addition of Protonix and continuation of his other  medications.   IMPRESSION:  1. Atypical chest pain, myocardial infarction ruled out.  2. Coronary artery disease status post coronary artery bypass grafting      and prior coronary stents.  3. Moderate aortic stenosis.  4. Hypertension.  5. Benign prostatic hypertrophy.  6. Hypercholesterolemia.  7. History of chronic anemia.   DISCHARGE MEDICATIONS:  1. Benicar 20 mg per day.  2. Iron supplement daily.  3. Crestor 5 mg daily.  4. Norvasc 5 mg daily.  5. Plavix 75 mg per day.  6. Cardura 4 mg daily.  7. Protonix 40 mg daily.  8. Nitroglycerin sublingual p.r.n.   DISCHARGE INSTRUCTIONS:  The patient is to continue with a low-sodium  heart-healthy diet.  He is to increase his activity slowly.  He has a  followup visit in our office in 1 week.   DISCHARGE STATUS:  Improved.           ______________________________  Peter M. Swaziland, M.D.     PMJ/MEDQ  D:  03/29/2009  T:  03/30/2009  Job:  [id number]   cc:   Colleen Can. Deborah Chalk, M.D.

## 2011-01-31 NOTE — Discharge Summary (Signed)
Jordan [name], Jordan [name]                         ACCOUNT NO.:  [id number]   MEDICAL RECORD NO.:  [id number]                   PATIENT TYPE:  INP   LOCATION:  3705                                 FACILITY:  [facility]   PHYSICIAN:  Colleen Can. Deborah Chalk, M.D.            DATE OF BIRTH:  10/11/1917   DATE OF ADMISSION:  04/20/2003  DATE OF DISCHARGE:  04/27/2003                                 DISCHARGE SUMMARY   PRIMARY DISCHARGE DIAGNOSES:  1. Significant weakness and fatigue with subsequent elective cardiac     catheterization revealing 40% proximal irregularities of the right     coronary, normal left main, irregularities of the left anterior     descending artery with a small first diagonal, irregularities of the left     circumflex, a 10 mm gradient across the aortic valve.  The patient is     felt to best be treated medically.  2. Arteriovenous block with subsequent implantation of a Medtronic KAPPA KDR     901 dual-chamber, rate-responsive pacemaker, Serial Number [id number].  3. Fever with one positive blood culture for gram-positive cocci in     clusters.   SECONDARY DISCHARGE DIAGNOSES:  1. Atherosclerotic cardiovascular disease with previous pecutaneous coronary     intervention to the left anterior descending artery in March 2000.  2. Hyperlipidemia.  3. Mild aortic stenosis with the most recent 2-D echocardiogram this     admission.  4. History of anemia.  5. Hypertension.   HISTORY OF PRESENT ILLNESS:  The patient is an 77 year old white male who  has known coronary disease, hyperlipidemia, and hypertension.  He presented  to primary care with complaints of a three to four-week history of worsening  symptoms of easy fatigability, especially with exertion.  These symptoms  were highly unusual for him.  He had no frank syncope. They were somewhat  similar to his previous chest pain syndrome.  He was subsequently seen and  referred to the hospital for admission and for further  evaluation.   Please see dictated History and Physical per Dr. Gwen Pounds for further  patient presentation and profile.   ADMISSION LABORATORY DATA:  Cardiac enzymes were negative.  Chemistries were  normal.  CBC showed a hemoglobin of 11.  Hematocrit was 33.   Chest x-ray showed chronic bibasilar scarring.   A 12-lead electrocardiogram initially showed Wenckebach and then  subsequently sinus rhythm with a very prolonged first degree A-V block.   HOSPITAL COURSE:  The patient was admitted to the service of Dr. Roger Shelter.  His cardiac enzymes were negative.  It was felt that we needed to  proceed on with cardiac catheterization which was performed on 04/21/2003.  Those results are as noted above.  The patient was subsequently transferred  back to telemetry and was stable post catheterization.  He continued to  exhibit a very prolonged first degree A-V  block and possible second  degree  A-V block by the following day.  He was watched on telemetry throughout the  remainder of the weekend at which time he demonstrated significant  bradycardia with a heart rate down into the 30s.  Plans for discharge were  placed on hold.  His Plavix was discontinued, and we made plans to proceed  on with pacemaker implantation.  We originally planned to do that on  Tuesday,  August 10; however, the patient began suffering from fevers of  unknown etiology.  Significant cultures were drawn.  IV sites were changed.  By 04/26/2003, the patient was feeling better.  T-max at that time was just  99.  One blood culture was positive for gram-positive cocci in clusters.  The second one showed no growth.  Urine culture was unremarkable.  Followup  chest x-ray was negative as well.  CBC was satisfactory.  Chemistries were  satisfactory as well.  Clearly, he was clinically improved, and we proceeded  on 04/26/2003 with pacemaker implantation.   The patient was implanted with a dual-chamber, rate-responsive  permanent  pacemaker.  It was a Medtronic KAPPA KDR 901, Serial Number [id number].  The  procedure was tolerated well.  He received 2 doses of vancomycin  preoperatively and one dose postoperatively.  Today, 04/27/2003, he is doing  well without complaints.  His T-max has been 100.0.  His overall physical  exam is unremarkable, and he is felt to be a stable candidate for discharge.  He will be discharged on a short course of antibiotics.   CONDITION ON DISCHARGE:  Stable.   DISCHARGE MEDICATIONS:  We will resume Lipitor at 10 mg a day.  We will stop  the isosorbide.  Will decrease the Cardura to 2 mg a day, decrease Prinivil  to 5 mg a day.  He will be placed on Plavix 75 mg a day as well as Levaquin  500 mg for one week.   FOLLOW UP:  He is to call our office for any problems.  Otherwise, we will  plan on seeing him back in approximately 7 to 10 days.      Jordan [name], N.P.                 Colleen Can. Deborah Chalk, M.D.    LCO/MEDQ  D:  04/27/2003  T:  04/27/2003  Job:  [id number]   cc:   Gwen Pounds, M.D.  [address]  [place]  [place] [address]  Fax: [phone]

## 2011-01-31 NOTE — Discharge Summary (Signed)
NAMEAZZAM, MEHRA             ACCOUNT NO.:  [id number]   MEDICAL RECORD NO.:  [id number]          PATIENT TYPE:  INP   LOCATION:  2027                         FACILITY:  [facility]   PHYSICIAN:  Colleen Can. Deborah Chalk, M.D.DATE OF BIRTH:  [birth year]   DATE OF ADMISSION:  07/05/2005  DATE OF DISCHARGE:  07/06/2005                                 DISCHARGE SUMMARY   PRIMARY DISCHARGE DIAGNOSIS:  Atypical chest pain with negative cardiac  enzymes.   SECONDARY DISCHARGE DIAGNOSES:  1.  Known atherosclerotic cardiovascular disease with previous intervention      to the LAD in 2000.  2.  Known aortic stenosis felt to be mild in nature.  3.  History of anemia.  4.  Hypertension.  5.  Functioning dual-chamber pacemaker.  6.  Hyperlipidemia.  7.  Elevated white count of questionable etiology.   HISTORY OF PRESENT ILLNESS:  The patient is a very pleasant 77 year old  white male who presents to the emergency room per EMS with complaints of  chest discomfort after he went outside to get the newspaper. He had some  diaphoresis and some slight nausea. He felt better after having a bowel  movement. He did call EMS and was brought in for further evaluation.   Please see dictated history and physical for further patient presentation  and profile.   LABORATORY DATA:  EKG showed paced rhythm. Chemistries were normal except  for BUN of 29, creatinine of 1.5, glucose 186. White count was elevated at  16,000, but otherwise unremarkable.   HOSPITAL COURSE:  The patient was admitted and he was placed on IV  nitroglycerin and IV heparin. He ruled out negative for myocardial  infarction. Blood pressure did drop with IV nitroglycerin. This was  subsequently discontinued and he remained pain free. He had a repeat CBC the  following morning which showed a white count of 14,000. His chemistries have  remained stable. His TSH is normal. Urinalysis is unremarkable. It is our  plan at this time to discharge  home with further outpatient evaluation to  occur.   DISCHARGE CONDITION:  Stable.   DISCHARGE MEDICATIONS:  Plavix 75 mg daily, Lipitor 10 mg daily, Cardura 4  mg, and Prinivil 10 mg, all he was taking before. We will add Protonix 40 mg  daily.   Our office will call him on Monday for further instructions regarding plans  for 2-D echocardiogram as well as Cardiolite testing. He is to call if any  problems arise in the interim.      Sharlee Blew, N.P.      Colleen Can. Deborah Chalk, M.D.  Electronically Signed    LC/MEDQ  D:  07/06/2005  T:  07/06/2005  Job:  [id number]   cc:   Gwen Pounds, MD  Fax: [phone]

## 2011-01-31 NOTE — Cardiovascular Report (Signed)
   NAMECYAN, MOULTRIE                         ACCOUNT NO.:  [id number]   MEDICAL RECORD NO.:  [id number]                   PATIENT TYPE:  INP   LOCATION:  3705                                 FACILITY:  [facility]   PHYSICIAN:  Colleen Can. Deborah Chalk, M.D.            DATE OF BIRTH:  12/18/1917   DATE OF PROCEDURE:  04/26/2003  DATE OF DISCHARGE:                              CARDIAC CATHETERIZATION   PROCEDURE:  Implantation of dual chamber pacing system with atrial and  ventricular leads and pulse generator under fluoroscopy.   INDICATIONS:  Second degree AV block.   PROCEDURE:  The patient was prepped and draped.  The right subclavicular  area was infiltrated with 1% Xylocaine.  Two punctures were made into the  right subclavian vein.  Using 7-[employer] [facility] introducers, the atrial and  ventricular lead was introduced.  The ventricular lead the guiding model  4470 serial [phone].  The lead was a bipolar active fixation lead 52 cm  in length.  The measurements showed R-waves of 19.4 millivolts with a slew  rate of 4 volts per second.  The amp was two thresholds measured at 0.5  volts, a pulse width of 0.5 milliseconds of current and 0.7 MA.  The  impedence was 774 ohms.  Diaphragmatic testing was negative.  The atrial  lead was a guiding model 4469 serial [phone].  Lead was bipolar active  fixation lead 45 cm in length.  The atrial thresholds were P-waves of 2.8  millivolts with a slew rate of 1.1 volts per second.  The amplitude was 0.4  volts at a pulse width of 0.5 milliseconds with a current of 1.1 MA.  Impedence was 417 ohms.  Diaphragmatic testing was negative.  Leads were  sutured in place.  The wound was flushed with kanamycin solution.  The leads  were connected to a Medtronic Kappa KVR 91 pulse generator system serial  #[id number].  The unit was sutured in place.  Wound was closed with 2-0 and  subsequently 5-0 Dexon and Steri-Strips were applied.  The patient tolerated  procedure well.                                                Colleen Can. Deborah Chalk, M.D.    SNT/MEDQ  D:  04/26/2003  T:  04/27/2003  Job:  [id number]

## 2011-02-04 ENCOUNTER — Encounter: Payer: Self-pay | Admitting: *Deleted

## 2011-02-05 ENCOUNTER — Ambulatory Visit (INDEPENDENT_AMBULATORY_CARE_PROVIDER_SITE_OTHER): Payer: Medicare Other | Admitting: Internal Medicine

## 2011-02-05 ENCOUNTER — Encounter: Payer: Self-pay | Admitting: *Deleted

## 2011-02-05 ENCOUNTER — Telehealth: Payer: Self-pay | Admitting: Internal Medicine

## 2011-02-05 ENCOUNTER — Encounter: Payer: Self-pay | Admitting: Internal Medicine

## 2011-02-05 DIAGNOSIS — E785 Hyperlipidemia, unspecified: Secondary | ICD-10-CM

## 2011-02-05 DIAGNOSIS — I10 Essential (primary) hypertension: Secondary | ICD-10-CM

## 2011-02-05 DIAGNOSIS — I2510 Atherosclerotic heart disease of native coronary artery without angina pectoris: Secondary | ICD-10-CM

## 2011-02-05 DIAGNOSIS — I442 Atrioventricular block, complete: Secondary | ICD-10-CM

## 2011-02-05 DIAGNOSIS — Z950 Presence of cardiac pacemaker: Secondary | ICD-10-CM

## 2011-02-05 DIAGNOSIS — Z79899 Other long term (current) drug therapy: Secondary | ICD-10-CM

## 2011-02-05 LAB — CBC WITH DIFFERENTIAL/PLATELET
Basophils Relative: 0.3 % (ref 0.0–3.0)
Eosinophils Absolute: 0.2 10*3/uL (ref 0.0–0.7)
HCT: 31 % — ABNORMAL LOW (ref 39.0–52.0)
Lymphs Abs: 2.8 10*3/uL (ref 0.7–4.0)
MCHC: 33.7 g/dL (ref 30.0–36.0)
MCV: 92.2 fl (ref 78.0–100.0)
Monocytes Absolute: 0.5 10*3/uL (ref 0.1–1.0)
Neutrophils Relative %: 51.6 % (ref 43.0–77.0)
Platelets: 168 10*3/uL (ref 150.0–400.0)

## 2011-02-05 LAB — BASIC METABOLIC PANEL
BUN: 28 mg/dL — ABNORMAL HIGH (ref 6–23)
Calcium: 9 mg/dL (ref 8.4–10.5)
GFR: 61.57 mL/min (ref >60.00)
Glucose, Bld: 116 mg/dL — ABNORMAL HIGH (ref 70–99)

## 2011-02-05 NOTE — Telephone Encounter (Signed)
Pt calling to set up procedure discussed with dr taylor today

## 2011-02-05 NOTE — Telephone Encounter (Signed)
Set up for 02/14/11

## 2011-02-05 NOTE — Patient Instructions (Addendum)
Please call our office to schedule procedure after reviewing your schedule.  Possible days for procedure are: June 1, June 4, June 18, June 21, June 25 and June 28.

## 2011-02-05 NOTE — Progress Notes (Signed)
Pacer remote check  

## 2011-02-06 NOTE — Assessment & Plan Note (Signed)
He will continue his current medications and maintain a low-sodium, low-fat diet.

## 2011-02-06 NOTE — Progress Notes (Signed)
HPI Jordan [name] he is referred today for ongoing evaluation and management of his permanent pacemaker. He is a patient of Dr. Ronnald Nian. He has a long history of bradycardia and status post permanent pacemaker insertion in 2004. The patient has done well since then despite his advanced age. He continues to play golf. He denies chest pain, shortness of breath, or peripheral edema. He walks on a regular basis. He has not had syncope. He has a history of coronary disease but denies any symptoms with this at the present time. Allergies  Allergen Reactions  . Aspirin      Current Outpatient Prescriptions  Medication Sig Dispense Refill  . clopidogrel (PLAVIX) 75 MG tablet Take 75 mg by mouth daily.        Marland Kitchen doxazosin (CARDURA) 4 MG tablet Take 4 mg by mouth at bedtime.        Marland Kitchen esomeprazole (NEXIUM) 40 MG capsule Take 40 mg by mouth daily before breakfast.        . ferrous sulfate 325 (65 FE) MG tablet Take 325 mg by mouth daily with breakfast.        . olmesartan (BENICAR) 20 MG tablet Take 20 mg by mouth daily.        . rosuvastatin (CRESTOR) 5 MG tablet Take 5 mg by mouth daily.           Past Medical History  Diagnosis Date  . CAD (coronary artery disease)   . Anemia   . Hyperlipidemia   . Pacemaker     ROS:   All systems reviewed and negative except as noted in the HPI.   Past Surgical History  Procedure Date  . Insert / replace / remove pacemaker     Medtronic  . Ptca     stent     Family History  Problem Relation Age of Onset  . Cancer Father     stomach  . Heart disease Brother   . Heart disease Brother      History   Social History  . Marital Status: Married    Spouse Name: N/A    Number of Children: N/A  . Years of Education: N/A   Occupational History  . Retired    Social History Main Topics  . Smoking status: Former Smoker    Types: Cigarettes    Quit date: 09/15/1973  . Smokeless tobacco: Not on file  . Alcohol Use: No  . Drug Use: No  .  Sexually Active: Not on file   Other Topics Concern  . Not on file   Social History Narrative  . No narrative on file     BP 132/66  Pulse 65  Ht 5\' 5"  (1.651 m)  Physical Exam:  Well appearing elderly man NAD HEENT: Unremarkable Neck:  No JVD, no thyromegally Lymphatics:  No adenopathy Back:  No CVA tenderness Lungs:  Clear. Well-healed pacemaker incision HEART:  Regular rate rhythm, no murmurs, no rubs, no clicks Abd:  Flat, positive bowel sounds, no organomegally, no rebound, no guarding Ext:  2 plus pulses, no edema, no cyanosis, no clubbing Skin:  No rashes no nodules Neuro:  CN II through XII intact, motor grossly intact DEVICE  Normal device function.  See PaceArt for details. Device is at [facility].  Assess/Plan:

## 2011-02-06 NOTE — Assessment & Plan Note (Signed)
His device is working normally but is at [employer]. We'll schedule pacemaker generator change in the next several weeks. I have discussed the risks, benefits, goals, and expectations of pacemaker generator change and he wishes to proceed.

## 2011-02-06 NOTE — Assessment & Plan Note (Signed)
He denies anginal symptoms. He will continue his current medications. 

## 2011-02-08 ENCOUNTER — Other Ambulatory Visit: Payer: Self-pay | Admitting: Cardiology

## 2011-02-12 ENCOUNTER — Other Ambulatory Visit: Payer: Self-pay | Admitting: *Deleted

## 2011-02-12 ENCOUNTER — Telehealth: Payer: Self-pay | Admitting: Internal Medicine

## 2011-02-12 MED ORDER — DOXAZOSIN MESYLATE 4 MG PO TABS: 4.0000 mg | ORAL_TABLET | Freq: Every day | ORAL | Status: DC

## 2011-02-12 NOTE — Telephone Encounter (Signed)
Pt having pacemaker replaced Friday-has questions re eating and taking meds

## 2011-02-12 NOTE — Telephone Encounter (Signed)
Spoke with patient  He is aware of what to take and dot take  We went over his instruction sheet again

## 2011-02-12 NOTE — Telephone Encounter (Signed)
Refill done.  

## 2011-02-14 ENCOUNTER — Ambulatory Visit (HOSPITAL_COMMUNITY): Payer: Medicare Other

## 2011-02-14 ENCOUNTER — Ambulatory Visit (HOSPITAL_COMMUNITY)
Admission: RE | Admit: 2011-02-14 | Discharge: 2011-02-14 | Disposition: A | Discharge status: Home / Self Care | Payer: Medicare Other | Source: Ambulatory Visit | Attending: Internal Medicine | Admitting: Internal Medicine

## 2011-02-14 DIAGNOSIS — I498 Other specified cardiac arrhythmias: Secondary | ICD-10-CM

## 2011-02-14 DIAGNOSIS — Z7902 Long term (current) use of antithrombotics/antiplatelets: Secondary | ICD-10-CM | POA: Insufficient documentation

## 2011-02-14 DIAGNOSIS — Z79899 Other long term (current) drug therapy: Secondary | ICD-10-CM | POA: Insufficient documentation

## 2011-02-14 DIAGNOSIS — I2510 Atherosclerotic heart disease of native coronary artery without angina pectoris: Secondary | ICD-10-CM | POA: Insufficient documentation

## 2011-02-14 DIAGNOSIS — Z45018 Encounter for adjustment and management of other part of cardiac pacemaker: Secondary | ICD-10-CM | POA: Insufficient documentation

## 2011-02-14 DIAGNOSIS — E785 Hyperlipidemia, unspecified: Secondary | ICD-10-CM | POA: Insufficient documentation

## 2011-02-14 HISTORY — PX: INSERT / REPLACE / REMOVE PACEMAKER: SUR710

## 2011-02-14 LAB — SURGICAL PCR SCREEN
MRSA, PCR: NEGATIVE
Staphylococcus aureus: NEGATIVE

## 2011-02-17 ENCOUNTER — Other Ambulatory Visit: Payer: Self-pay | Admitting: Internal Medicine

## 2011-02-26 ENCOUNTER — Ambulatory Visit (INDEPENDENT_AMBULATORY_CARE_PROVIDER_SITE_OTHER): Payer: Medicare Other | Admitting: *Deleted

## 2011-02-26 DIAGNOSIS — I441 Atrioventricular block, second degree: Secondary | ICD-10-CM

## 2011-03-01 ENCOUNTER — Other Ambulatory Visit: Payer: Self-pay | Admitting: Cardiology

## 2011-03-04 ENCOUNTER — Other Ambulatory Visit: Payer: Self-pay | Admitting: Cardiology

## 2011-03-04 NOTE — Telephone Encounter (Signed)
Pt called he wanted refill of crestor

## 2011-04-03 NOTE — Op Note (Signed)
  NAMEWALDON, SHEERIN             ACCOUNT NO.:  [id number]  MEDICAL RECORD NO.:  [id number]           PATIENT TYPE:  O  LOCATION:  [facility]                         FACILITY:  [facility]  PHYSICIAN:  Doylene Canning. Ladona Ridgel, MD    DATE OF BIRTH:  1918/02/01  DATE OF PROCEDURE:  02/14/2011 DATE OF DISCHARGE:  02/14/2011                              OPERATIVE REPORT   PROCEDURE PERFORMED:  Removal of a previously implanted dual-chamber pacemaker which had reached elective replacement indication and insertion of a new dual-chamber pacemaker.  INTRODUCTION:  The patient is a 77 year old man who has done amazingly well over  the years.  He has remained active.  He has a history of symptomatic bradycardia status post pacemaker insertion and has reached device elective replacement indication.  He is now referred for pacemaker generator change.  PROCEDURE:  After informed consent was obtained, the patient was taken to the diagnostic EP lab in a fasting state.  After usual preparation and draping, intravenous fentanyl and midazolam was given for sedation. 30 mL of lidocaine was infiltrated into the right infraclavicular region.  A 5-cm incision was carried out over this region. Electrocautery was utilized to dissect down to the pacemaker pocket. The device was removed without difficulty.  The leads were evaluated. These were Guidant leads.  The atrial lead was a 4469 - 45 cm lead, serial number [id number] and the ventricular lead was a Guidant 4470-52 lead, serial number [id number] both were found to be working satisfactorily. The R-waves were decreased chronically on the RV lead at approximately 3 mV.  Pacing thresholds impedances were however stable.  With these satisfactory parameters the new Medtronic Sensia dual-chamber pacemaker serial number [id number] was connected to the atrial and ventricular leads and placed back in the subcutaneous pocket.  Pocket was irrigated with antibiotic irrigation and the  incision was closed with 2-0 and 3-0 Vicryl.  Benzoin and Steri-Strips were painted on the skin.  Pressure dressing was applied and the patient was returned to his room in satisfactory condition.  COMPLICATIONS:  There were no immediate procedure complications.  RESULTS:  Demonstrate successful removal of a previously implanted dual- chamber pacemaker and insertion of a new dual-chamber device in a patient with symptomatic bradycardia.     Doylene Canning. Ladona Ridgel, MD     GWT/MEDQ  D:  02/14/2011  T:  02/14/2011  Job:  [id number]  Electronically Signed by [name] MD on 04/03/2011 08:30:47 AM

## 2011-05-06 ENCOUNTER — Ambulatory Visit (INDEPENDENT_AMBULATORY_CARE_PROVIDER_SITE_OTHER): Payer: Medicare Other | Admitting: Nurse Practitioner

## 2011-05-06 ENCOUNTER — Encounter: Payer: Self-pay | Admitting: Nurse Practitioner

## 2011-05-06 VITALS — BP 148/80 | HR 90 | Ht 65.0 in | Wt 169.8 lb

## 2011-05-06 DIAGNOSIS — I350 Nonrheumatic aortic (valve) stenosis: Secondary | ICD-10-CM | POA: Insufficient documentation

## 2011-05-06 DIAGNOSIS — I2510 Atherosclerotic heart disease of native coronary artery without angina pectoris: Secondary | ICD-10-CM

## 2011-05-06 DIAGNOSIS — Z950 Presence of cardiac pacemaker: Secondary | ICD-10-CM

## 2011-05-06 DIAGNOSIS — I359 Nonrheumatic aortic valve disorder, unspecified: Secondary | ICD-10-CM

## 2011-05-06 DIAGNOSIS — E785 Hyperlipidemia, unspecified: Secondary | ICD-10-CM

## 2011-05-06 NOTE — Assessment & Plan Note (Signed)
Doing well. He is to continue with medical management. We will see him again in 6 months. Patient is agreeable to this plan and will call if any problems develop in the interim.

## 2011-05-06 NOTE — Patient Instructions (Signed)
Continue with your current medicines I think you look good from your heart standpoint. We will see you back in 6 months. Call for any problems.

## 2011-05-06 NOTE — Progress Notes (Signed)
    Jordan [name] Date of Birth: 04-14-1918   History of Present Illness: Jordan [name] is seen today for his 6 month visit. He is seen for Dr. Ladona Ridgel. He is a former patient of Dr. Ronnald Nian. He has had a good 6 months. He will be 89 this November. He continues to golf every Saturday. No chest pain, shortness of breath or syncope reported. He feels good on his medicines. No falls. Labs are checked by Dr. Jarold Motto.    Current Outpatient Prescriptions on File Prior to Visit  Medication Sig Dispense Refill  . amLODipine (NORVASC) 5 MG tablet Take 5 mg by mouth daily.        . clopidogrel (PLAVIX) 75 MG tablet Take 75 mg by mouth daily.        . CRESTOR 5 MG tablet TAKE 1 TABLET BY MOUTH EVERY DAY  30 tablet  6  . doxazosin (CARDURA) 4 MG tablet Take 1 tablet (4 mg total) by mouth at bedtime.  30 tablet  5  . esomeprazole (NEXIUM) 40 MG capsule Take 40 mg by mouth daily before breakfast.        . ferrous sulfate 325 (65 FE) MG tablet Take 325 mg by mouth daily with breakfast.        . olmesartan (BENICAR) 20 MG tablet Take 20 mg by mouth daily.          Allergies  Allergen Reactions  . Aspirin     Past Medical History  Diagnosis Date  . CAD (coronary artery disease)     Remote stent to LAD in 1999. Last cath in 2004 and he is managed medically  . Anemia   . Hyperlipidemia   . Pacemaker     due to bradycardia; placed in 2004  . Aortic stenosis, moderate     Per echo in 2010  . BPH (benign prostatic hyperplasia)   . HTN (hypertension)   . Colon polyps   . Basal cell carcinoma of skin     Past Surgical History  Procedure Date  . Pacemaker insertion 2004    Medtronic  . Coronary stent placement 1999    LAD  . Cardiac catheterization 2004    Managed medically    History  Smoking status  . Former Smoker  . Types: Cigarettes  . Quit date: 09/15/1973  Smokeless tobacco  . Not on file    History  Alcohol Use No    Family History  Problem Relation Age of Onset  .  Cancer Father     stomach  . Heart disease Brother   . Heart disease Brother     Review of Systems: The review of systems is positive for mild fatigue.  He is followed in the pacer clinic. All other systems were reviewed and are negative.  Physical Exam: BP 148/80  Pulse 90  Ht 5\' 5"  (1.651 m)  Wt 169 lb 12.8 oz (77.021 kg)  BMI 28.26 kg/m2 Patient is very pleasant and in no acute distress. He looks younger than his stated age. Skin is warm and dry. Color is normal.  HEENT is unremarkable. Normocephalic/atraumatic. PERRL. Sclera are nonicteric. Neck is supple. No masses. No JVD. Lungs are clear. Cardiac exam shows a regular rate and rhythm. He has a 2/6 outflow murmur. Abdomen is soft. Extremities are without edema. Gait and ROM are intact. No gross neurologic deficits noted.   LABORATORY DATA:   Assessment / Plan:

## 2011-05-06 NOTE — Assessment & Plan Note (Signed)
Followed in device clinic. 

## 2011-05-06 NOTE — Assessment & Plan Note (Signed)
Labs are checked by primary care.

## 2011-05-06 NOTE — Assessment & Plan Note (Signed)
No cardinal symptoms reported. Last echo was in June of 2010.

## 2011-05-27 ENCOUNTER — Ambulatory Visit (INDEPENDENT_AMBULATORY_CARE_PROVIDER_SITE_OTHER): Payer: Medicare Other | Admitting: Internal Medicine

## 2011-05-27 ENCOUNTER — Encounter: Payer: Self-pay | Admitting: Internal Medicine

## 2011-05-27 DIAGNOSIS — I359 Nonrheumatic aortic valve disorder, unspecified: Secondary | ICD-10-CM

## 2011-05-27 DIAGNOSIS — Z950 Presence of cardiac pacemaker: Secondary | ICD-10-CM

## 2011-05-27 DIAGNOSIS — I350 Nonrheumatic aortic (valve) stenosis: Secondary | ICD-10-CM

## 2011-05-27 DIAGNOSIS — I2510 Atherosclerotic heart disease of native coronary artery without angina pectoris: Secondary | ICD-10-CM

## 2011-05-27 DIAGNOSIS — I441 Atrioventricular block, second degree: Secondary | ICD-10-CM

## 2011-05-27 LAB — PACEMAKER DEVICE OBSERVATION
AL IMPEDENCE PM: 453 Ohm
BAMS-0001: 150 {beats}/min
BATTERY VOLTAGE: 2.78 V
RV LEAD AMPLITUDE: 4 mv
VENTRICULAR PACING PM: 78

## 2011-05-27 NOTE — Patient Instructions (Signed)
Your physician wants you to follow-up in: 02/2012 with Dr Court Joy will receive a reminder letter in the mail two months in advance. If you don't receive a letter, please call our office to schedule the follow-up appointment.

## 2011-05-27 NOTE — Assessment & Plan Note (Signed)
He has at least modest aortic stenosis. He will be managed conservatively based on his advanced age.

## 2011-05-27 NOTE — Assessment & Plan Note (Signed)
His device is working normally. We'll plan to recheck in several months. 

## 2011-05-27 NOTE — Progress Notes (Signed)
HPI Jordan [name] returns today for followup. 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 He has hypertension. He has dyslipidemia. He has aortic stenosis which has been moderate in the past. The patient complains of weakness and fatigue though this is not severe. He is able to complete his activities of daily living. He has not had frank syncope and denies chest pain. Allergies  Allergen Reactions  . Aspirin      Current Outpatient Prescriptions  Medication Sig Dispense Refill  . amLODipine (NORVASC) 5 MG tablet Take 5 mg by mouth daily.        . clopidogrel (PLAVIX) 75 MG tablet Take 75 mg by mouth daily.        . CRESTOR 5 MG tablet TAKE 1 TABLET BY MOUTH EVERY DAY  30 tablet  6  . doxazosin (CARDURA) 4 MG tablet Take 1 tablet (4 mg total) by mouth at bedtime.  30 tablet  5  . esomeprazole (NEXIUM) 40 MG capsule Take 40 mg by mouth daily before breakfast.        . ferrous sulfate 325 (65 FE) MG tablet Take 325 mg by mouth daily with breakfast.        . olmesartan (BENICAR) 20 MG tablet Take 20 mg by mouth daily.           Past Medical History  Diagnosis Date  . CAD (coronary artery disease)     Remote stent to LAD in 1999. Last cath in 2004 and he is managed medically  . Anemia   . Hyperlipidemia   . Pacemaker     due to bradycardia; placed in 2004  . Aortic stenosis, moderate     Per echo in 2010  . BPH (benign prostatic hyperplasia)   . HTN (hypertension)   . Colon polyps   . Basal cell carcinoma of skin     ROS:   All systems reviewed and negative except as noted in the HPI.   Past Surgical History  Procedure Date  . Pacemaker insertion 2004    Medtronic  . Coronary stent placement 1999    LAD  . Cardiac catheterization 2004    Managed medically     Family History  Problem Relation Age of Onset  . Cancer Father     stomach  . Heart disease Brother   . Heart disease Brother       History   Social History  . Marital Status: Married    Spouse Name: N/A    Number of Children: N/A  . Years of Education: N/A   Occupational History  . Retired    Social History Main Topics  . Smoking status: Former Smoker    Types: Cigarettes    Quit date: 09/15/1973  . Smokeless tobacco: Not on file  . Alcohol Use: No  . Drug Use: No  . Sexually Active: Not on file   Other Topics Concern  . Not on file   Social History Narrative  . No narrative on file     BP 122/58  Pulse 71  Ht 5\' 5"  (1.651 m)  Wt 169 lb 6.4 oz (76.839 kg)  BMI 28.19 kg/m2  Physical Exam:  Well appearing elderly man,  NAD HEENT: Unremarkable Neck:  No JVD, no thyromegally Lymphatics:  No adenopathy Back:  No CVA tenderness Lungs:  Clear. Well-healed pacemaker incision. HEART:  Regular rate rhythm, with a grade 2/6 systolic murmur at the right  upper sternal border. S2 is reduced but present Abd:  soft, positive bowel sounds, no organomegally, no rebound, no guarding Ext:  2 plus pulses, no edema, no cyanosis, no clubbing Skin:  No rashes no nodules Neuro:  CN II through XII intact, motor grossly intact  DEVICE  Normal device function.  See PaceArt for details.   Assess/Plan:

## 2011-05-27 NOTE — Assessment & Plan Note (Signed)
He has minimal anginal symptoms. He will continue his current medical therapy and maintain as much activity as possible.

## 2011-05-31 ENCOUNTER — Other Ambulatory Visit: Payer: Self-pay | Admitting: Cardiology

## 2011-06-02 ENCOUNTER — Telehealth: Payer: Self-pay | Admitting: Internal Medicine

## 2011-06-02 MED ORDER — AMLODIPINE BESYLATE 5 MG PO TABS: 5.0000 mg | ORAL_TABLET | Freq: Every day | ORAL | Status: DC

## 2011-06-02 NOTE — Telephone Encounter (Signed)
Pt wants refill norvasc to go [facility] on [address] [phone]

## 2011-06-02 NOTE — Telephone Encounter (Signed)
Refilled Meds from fax  

## 2011-06-26 ENCOUNTER — Encounter: Payer: Self-pay | Admitting: Internal Medicine

## 2011-07-29 ENCOUNTER — Encounter: Payer: Self-pay | Admitting: Internal Medicine

## 2011-08-02 ENCOUNTER — Other Ambulatory Visit: Payer: Self-pay | Admitting: Cardiology

## 2011-08-04 ENCOUNTER — Ambulatory Visit (INDEPENDENT_AMBULATORY_CARE_PROVIDER_SITE_OTHER): Payer: Medicare Other | Admitting: Internal Medicine

## 2011-08-04 ENCOUNTER — Encounter: Payer: Self-pay | Admitting: Internal Medicine

## 2011-08-04 VITALS — BP 108/60 | HR 60 | Ht 65.0 in | Wt 165.4 lb

## 2011-08-04 DIAGNOSIS — Z860100 Personal history of colon polyps, unspecified: Secondary | ICD-10-CM

## 2011-08-04 DIAGNOSIS — D509 Iron deficiency anemia, unspecified: Secondary | ICD-10-CM

## 2011-08-04 DIAGNOSIS — K219 Gastro-esophageal reflux disease without esophagitis: Secondary | ICD-10-CM

## 2011-08-04 DIAGNOSIS — R195 Other fecal abnormalities: Secondary | ICD-10-CM

## 2011-08-04 MED ORDER — PEG-KCL-NACL-NASULF-NA ASC-C 100 G PO SOLR: 1.0000 | Freq: Once | ORAL | Status: DC

## 2011-08-04 NOTE — Patient Instructions (Signed)
You have been scheduled for an endoscopy and colonoscopy with propofol. Please follow the written instructions given to you at your visit today. Please pick up yourprep at the pharmacy within the next 2-3 days.  We will contact you to let you know about holding your plavix.

## 2011-08-04 NOTE — Progress Notes (Signed)
HISTORY OF PRESENT ILLNESS:  Jordan [name] is a 77 y.o. male with multiple medical problems including hypertension, hyperlipidemia, coronary artery disease status post remote coronary artery stent placement, prior pacemaker placement, aortic stenosis, adenomatous colon polyps, and chronic anemia for which he remains on chronic iron therapy. The patient was last evaluated in the office October 2009 regarding transient abdominal pain. See that dictation for details. He has undergone multiple prior colonoscopies with Dr. Terrial Rhodes, including 1991, 1992, 1994, in 1999. There is a family history of colon cancer in his father at age 53. Patient is sent at this time regarding Hemoccult-positive stool performed on routine testing as part of his annual physical exam and evaluation. He remains anemic with hemoglobin of 10.4. Normal MCV. Normal white blood cell count and platelets. His only GI complaint is GERD for which she takes Nexium. Asymptomatic on medication. His prior history of NSAID-induced ulcers. He does take Plavix therapy daily. His GI review of systems is otherwise negative. He is quite active and plays golf at least one day per week, shooting his age or better!  REVIEW OF SYSTEMS:  All non-GI ROS negative except for arthritis. Recent pacemaker ReVision without difficulty  Past Medical History  Diagnosis Date  . CAD (coronary artery disease)     Remote stent to LAD in 1999. Last cath in 2004 and he is managed medically  . Anemia   . Hyperlipidemia   . Pacemaker     due to bradycardia; placed in 2004  . Aortic stenosis, moderate     Per echo in 2010  . BPH (benign prostatic hyperplasia)   . HTN (hypertension)   . Colon polyps   . Basal cell carcinoma of skin     Past Surgical History  Procedure Date  . Pacemaker insertion 2004    Medtronic  . Coronary stent placement 1999    LAD  . Cardiac catheterization 2004    Managed medically    Social History Jordan [name]   reports that he quit smoking about 37 years ago. His smoking use included Cigarettes. He has never used smokeless tobacco. He reports that he does not drink alcohol or use illicit drugs.  family history includes Cancer in his father; Colon cancer in his father; and Heart disease in his brothers.  Allergies  Allergen Reactions  . Aspirin        PHYSICAL EXAMINATION: Vital signs: BP 108/60  Pulse 60  Ht 5\' 5"  (1.651 m)  Wt 165 lb 6.4 oz (75.025 kg)  BMI 27.52 kg/m2  Constitutional: generally well-appearing, no acute distress Psychiatric: alert and oriented x3, cooperative Eyes: extraocular movements intact, anicteric, conjunctiva pink Mouth: oral pharynx moist, no lesions Neck: supple no lymphadenopathy Cardiovascular: heart regular rate and rhythm, 3/6 holosystolic murmur Lungs: clear to auscultation bilaterally Abdomen: soft, nontender, nondistended, no obvious ascites, no peritoneal signs, normal bowel sounds, no organomegaly Rectal: Deferred until colonoscopy Extremities: no lower extremity edema bilaterally Skin: no lesions on visible extremities Neuro: No focal deficits.   ASSESSMENT:  #1. Hemoccult-positive stool in a 77 year old with chronic anemia, chronic Plavix therapy, and a personal history of ulcer disease and colonic adenomas. Rule out underlying mucosal disease #2. GERD #3. History of colon polyps #4. Family history of colon cancer #5. Chronic anemia, normocytic. On iron #6. Multiple medical problems   PLAN:  #1. I had a long and candid discussions with the patient regarding the options for evaluating his Hemoccult-positive stool. These include observation only, radiology studies, or  endoscopic evaluation. We discussed the possibility of no significant underlying mucosal pathology or more significant pathology, such as colonic neoplasia. We also discussed the risks and benefits of the different strategies. To this end, with patient being well informed, it was  his preference to undergo endoscopic evaluations in the form of colonoscopy and upper endoscopy. His feeling was that he is quite active, his parents lived to be about 100, and he would like to be proactive. As well, he pointed out that it has been over 10 years since his last colonoscopy.The nature of the procedure, as well as the risks, benefits, and alternatives were carefully and thoroughly reviewed with the patient. Ample time for discussion and questions allowed. The patient understood, was satisfied, and agreed to proceed. The prep prescribed was Movi prep. The patient instructed on its use. #2. CRNA supervise propofol administration given his age and comorbidities #3. Check with his cardiologist, Dr. Ladona Ridgel, the feasibility of holding Plavix 1 week prior to the examinations, should he need endoscopic therapy.

## 2011-08-05 NOTE — Progress Notes (Signed)
Ok

## 2011-08-06 ENCOUNTER — Telehealth: Payer: Self-pay

## 2011-08-06 NOTE — Telephone Encounter (Signed)
Spoke to pt and told them they could hold their plavix 1 week before their procedure per Dr. Ladona Ridgel and Dr. Marina Goodell

## 2011-08-06 NOTE — Telephone Encounter (Signed)
Message copied by Jeanine Luz on Wed Aug 06, 2011 11:03 AM ------      Message from: Hilarie Fredrickson      Created: Wed Aug 06, 2011 10:14 AM       Verlon Au, Dr. Ladona Ridgel approved holding Mr. Grainger Plavix 1 week prior to his procedures. Please confirm this with the patient. Thank you      ----- Message -----         From: [name], MD         Sent: 08/05/2011  10:06 PM           To: Yancey Flemings, MD            Tammy Sours, would like to hold Mr. Gillson Plavix 1 week prior to his procedures, if deemed medically reasonable by you. Thank you

## 2011-09-04 ENCOUNTER — Telehealth: Payer: Self-pay

## 2011-09-04 NOTE — Telephone Encounter (Signed)
patient wanted to confirm when to hold his Plavix before his procedure;  i reminded him to hold it for one week before procedure; he acknowledged understanding

## 2011-09-15 ENCOUNTER — Ambulatory Visit (AMBULATORY_SURGERY_CENTER): Payer: Medicare Other | Admitting: Internal Medicine

## 2011-09-15 ENCOUNTER — Encounter: Payer: Self-pay | Admitting: Internal Medicine

## 2011-09-15 DIAGNOSIS — D509 Iron deficiency anemia, unspecified: Secondary | ICD-10-CM

## 2011-09-15 DIAGNOSIS — D126 Benign neoplasm of colon, unspecified: Secondary | ICD-10-CM

## 2011-09-15 DIAGNOSIS — Z860100 Personal history of colon polyps, unspecified: Secondary | ICD-10-CM

## 2011-09-15 DIAGNOSIS — D649 Anemia, unspecified: Secondary | ICD-10-CM

## 2011-09-15 DIAGNOSIS — Z1211 Encounter for screening for malignant neoplasm of colon: Secondary | ICD-10-CM

## 2011-09-15 DIAGNOSIS — R195 Other fecal abnormalities: Secondary | ICD-10-CM

## 2011-09-15 DIAGNOSIS — K219 Gastro-esophageal reflux disease without esophagitis: Secondary | ICD-10-CM

## 2011-09-15 MED ORDER — SODIUM CHLORIDE 0.9 % IV SOLN: 500.0000 mL | INTRAVENOUS | Status: DC

## 2011-09-15 NOTE — Op Note (Signed)
[facility] [address] [place], [place]  [address]  ENDOSCOPY PROCEDURE REPORT  PATIENT:  Jordan, [name]  MR#:  [id number] BIRTHDATE:  Mar 04, 1918, 89 yrs. old  GENDER:  male  ENDOSCOPIST:  Wilhemina Bonito. Eda Keys, MD Referred by:  Jarome Matin, M.D.  PROCEDURE DATE:  09/15/2011 PROCEDURE:  EGD, diagnostic 43235 ASA CLASS:  Class II INDICATIONS:  GERD, hemoccult positive stool, anemia  MEDICATIONS:   MAC sedation, administered by CRNA, propofol (Diprivan) 50 mg IV TOPICAL ANESTHETIC:  none  DESCRIPTION OF PROCEDURE:   After the risks benefits and alternatives of the procedure were thoroughly explained, informed consent was obtained.  The LB GIF-H180 [id number] endoscope was introduced through the mouth and advanced to the third portion of the duodenum, without limitations.  The instrument was slowly withdrawn as the mucosa was fully examined. <<PROCEDUREIMAGES>>  The upper, middle, and distal third of the esophagus were carefully inspected and no abnormalities were noted. The z-line was well seen at the GEJ. The endoscope was pushed into the fundus which was normal including a retroflexed view. The antrum,gastric body, first and second part of the duodenum were unremarkable. Retroflexed views revealed no abnormalities.    The scope was then withdrawn from the patient and the procedure completed.  COMPLICATIONS:  None  ENDOSCOPIC IMPRESSION: 1) Normal EGD 2) Gerd  RECOMMENDATIONS: 1) continue current medications (RESUME PLAVIX TODAY) 2) FOLLOW UP PRN  ______________________________ Wilhemina Bonito. Eda Keys, MD  CC:  Jarome Matin, MD;  The Patient  n. eSIGNED:   Wilhemina Bonito. Eda Keys at 09/15/2011 10:01 AM  Gracy Racer, [phone]

## 2011-09-15 NOTE — Op Note (Signed)
[facility] [address] [place], [place]  [address]  COLONOSCOPY PROCEDURE REPORT  PATIENT:  Jordan [name], Jordan [name]  MR#:  [id number] BIRTHDATE:  [birth year], 89 yrs. old  GENDER:  male ENDOSCOPIST:  Wilhemina Bonito. Eda Keys, MD REF. BY:  Jarome Matin, M.D. PROCEDURE DATE:  09/15/2011 PROCEDURE:  Colonoscopy with snare polypectomy x 3 ASA CLASS:  Class II INDICATIONS:  history of pre-cancerous (adenomatous) colon polyps, FOBT positive stool, family history of colon cancer, surveillance and high-risk screening ; multiple prior exams - last 1999 MEDICATIONS:   MAC sedation, administered by CRNA, propofol (Diprivan) 100 mg IV  DESCRIPTION OF PROCEDURE:   After the risks benefits and alternatives of the procedure were thoroughly explained, informed consent was obtained.  Digital rectal exam was performed and revealed no abnormalities.   The LB [id number] endoscope was introduced through the anus and advanced to the cecum, which was identified by both the appendix and ileocecal valve, without limitations.  The quality of the prep was excellent, using MoviPrep.  The instrument was then slowly withdrawn as the colon was fully examined. <<PROCEDUREIMAGES>>  FINDINGS:  Three polyps, < 5mm, were found in the ascending, transverse, and descending colon. Polyps were snared without cautery. Retrieval was successful. A nonbleeding single arteriovenous malformations was seen in the in the ascending colon. Moderate diverticulosis was found in the sigmoid colon. Retroflexed views in the rectum revealed no abnormalities.    The time to cecum = 2:08  minutes. The scope was then withdrawn in 10:57  minutes from the cecum and the procedure completed.  COMPLICATIONS:  None  ENDOSCOPIC IMPRESSION: 1) Three polyps in the  colon - removed 2) AVM in the ascending colon 3) Moderate diverticulosis in the sigmoid colon  RECOMMENDATIONS: 1) Upper endoscopy today 2) RESUME PLAVIX TODAY 3) NO  ROUTINE FOLLOW UP COLONOSCOPY RECOMMENDED AT YOUR AGE  ______________________________ Wilhemina Bonito. Eda Keys, MD  CC:  Jarome Matin, MD; The Patient  n. eSIGNED:   Wilhemina Bonito. Eda Keys at 09/15/2011 09:57 AM  Gracy Racer, [phone]

## 2011-09-15 NOTE — Patient Instructions (Signed)
FOLLOW THE DISCHARGE INSTRUCTIONS ON THE GREEN AND BLUE DISCHARGE INSTRUCTION SHEETS.  RESUME YOUR MEDICATIONS INCLUDING PLAVIX TODAY.  AWAIT PATHOLOGY RESULTS.

## 2011-09-15 NOTE — Progress Notes (Signed)
Patient did not experience any of the following events: a burn prior to discharge; a fall within the facility; wrong site/side/patient/procedure/implant event; or a hospital transfer or hospital admission upon discharge from the facility. (G8907) Patient did not have preoperative order for IV antibiotic SSI prophylaxis. (G8918)  

## 2011-09-17 ENCOUNTER — Telehealth: Payer: Self-pay

## 2011-09-17 NOTE — Telephone Encounter (Signed)
Follow up Call- Patient questions:  Do you have a fever, pain , or abdominal swelling? no Pain Score  0 *  Have you tolerated food without any problems? yes  Have you been able to return to your normal activities? yes  Do you have any questions about your discharge instructions: Diet   no Medications  no Follow up visit  no  Do you have questions or concerns about your Care? no  Actions: * If pain score is 4 or above: No action needed, pain <4.  Per the pt the first bowel movement he had he did have some little blood clots pass with the bowel movement.  He has not seen any since.  No other sx noted.  I advised him to call if he sees any more clots of blood or if any other sx or concerns. maw

## 2011-09-18 ENCOUNTER — Inpatient Hospital Stay (HOSPITAL_COMMUNITY)
Admission: EM | Admit: 2011-09-18 | Discharge: 2011-09-20 | DRG: 287 | Disposition: A | Discharge status: Home / Self Care | Payer: Medicare Other | Attending: Cardiology | Admitting: Cardiology

## 2011-09-18 ENCOUNTER — Emergency Department (HOSPITAL_COMMUNITY): Payer: Medicare Other

## 2011-09-18 ENCOUNTER — Other Ambulatory Visit: Payer: Self-pay

## 2011-09-18 ENCOUNTER — Other Ambulatory Visit: Payer: Self-pay | Admitting: Physician Assistant

## 2011-09-18 ENCOUNTER — Encounter (HOSPITAL_COMMUNITY): Payer: Self-pay | Admitting: Emergency Medicine

## 2011-09-18 DIAGNOSIS — I350 Nonrheumatic aortic (valve) stenosis: Secondary | ICD-10-CM

## 2011-09-18 DIAGNOSIS — Z87891 Personal history of nicotine dependence: Secondary | ICD-10-CM

## 2011-09-18 DIAGNOSIS — Z950 Presence of cardiac pacemaker: Secondary | ICD-10-CM

## 2011-09-18 DIAGNOSIS — R079 Chest pain, unspecified: Secondary | ICD-10-CM

## 2011-09-18 DIAGNOSIS — I359 Nonrheumatic aortic valve disorder, unspecified: Principal | ICD-10-CM | POA: Diagnosis present

## 2011-09-18 DIAGNOSIS — N400 Enlarged prostate without lower urinary tract symptoms: Secondary | ICD-10-CM | POA: Diagnosis present

## 2011-09-18 DIAGNOSIS — I2510 Atherosclerotic heart disease of native coronary artery without angina pectoris: Secondary | ICD-10-CM | POA: Diagnosis present

## 2011-09-18 DIAGNOSIS — E785 Hyperlipidemia, unspecified: Secondary | ICD-10-CM | POA: Diagnosis present

## 2011-09-18 DIAGNOSIS — M1990 Unspecified osteoarthritis, unspecified site: Secondary | ICD-10-CM

## 2011-09-18 DIAGNOSIS — D509 Iron deficiency anemia, unspecified: Secondary | ICD-10-CM | POA: Diagnosis present

## 2011-09-18 DIAGNOSIS — R9431 Abnormal electrocardiogram [ECG] [EKG]: Secondary | ICD-10-CM

## 2011-09-18 HISTORY — DX: Other complications of anesthesia, initial encounter: T88.59XA

## 2011-09-18 HISTORY — DX: Unspecified hemorrhoids: K64.9

## 2011-09-18 HISTORY — DX: Adverse effect of unspecified anesthetic, initial encounter: T41.45XA

## 2011-09-18 HISTORY — DX: Unspecified osteoarthritis, unspecified site: M19.90

## 2011-09-18 HISTORY — PX: SKIN CANCER EXCISION: SHX779

## 2011-09-18 HISTORY — DX: Cardiac arrhythmia, unspecified: I49.9

## 2011-09-18 LAB — DIFFERENTIAL
Basophils Absolute: 0 10*3/uL (ref 0.0–0.1)
Basophils Relative: 1 % (ref 0–1)
Lymphocytes Relative: 22 % (ref 12–46)
Monocytes Absolute: 0.6 10*3/uL (ref 0.1–1.0)
Neutro Abs: 5 10*3/uL (ref 1.7–7.7)

## 2011-09-18 LAB — COMPREHENSIVE METABOLIC PANEL
ALT: 16 U/L (ref 0–53)
Alkaline Phosphatase: 55 U/L (ref 39–117)
BUN: 23 mg/dL (ref 6–23)
CO2: 26 mEq/L (ref 19–32)
Chloride: 104 mEq/L (ref 96–112)
GFR calc Af Amer: 54 mL/min — ABNORMAL LOW (ref >90)
GFR calc non Af Amer: 46 mL/min — ABNORMAL LOW (ref >90)
Glucose, Bld: 125 mg/dL — ABNORMAL HIGH (ref 70–99)
Potassium: 4.3 mEq/L (ref 3.5–5.1)
Sodium: 139 mEq/L (ref 135–145)
Total Bilirubin: 0.2 mg/dL — ABNORMAL LOW (ref 0.3–1.2)
Total Protein: 7.1 g/dL (ref 6.0–8.3)

## 2011-09-18 LAB — CBC
HCT: 28.5 % — ABNORMAL LOW (ref 39.0–52.0)
MCHC: 32.3 g/dL (ref 30.0–36.0)
Platelets: 177 10*3/uL (ref 150–400)
RDW: 12.7 % (ref 11.5–15.5)
WBC: 7.5 10*3/uL (ref 4.0–10.5)

## 2011-09-18 LAB — PROTIME-INR: INR: 1.1 (ref 0.00–1.49)

## 2011-09-18 LAB — GLUCOSE, CAPILLARY: Glucose-Capillary: 129 mg/dL — ABNORMAL HIGH (ref 70–99)

## 2011-09-18 LAB — MAGNESIUM: Magnesium: 1.9 mg/dL (ref 1.5–2.5)

## 2011-09-18 LAB — CARDIAC PANEL(CRET KIN+CKTOT+MB+TROPI): CK, MB: 2.3 ng/mL (ref 0.3–4.0)

## 2011-09-18 MED ORDER — ALPRAZOLAM 0.25 MG PO TABS: 0.2500 mg | ORAL_TABLET | Freq: Two times a day (BID) | ORAL | Status: DC | PRN

## 2011-09-18 MED ORDER — DIAZEPAM 5 MG PO TABS
5.0000 mg | ORAL_TABLET | ORAL | Status: DC
  Filled 2011-09-18: qty 1

## 2011-09-18 MED ORDER — MUPIROCIN 2 % EX OINT
TOPICAL_OINTMENT | Freq: Two times a day (BID) | CUTANEOUS | Status: DC
  Administered 2011-09-19: 01:00:00 via NASAL
  Filled 2011-09-18: qty 22

## 2011-09-18 MED ORDER — ASPIRIN 81 MG PO CHEW: 324.0000 mg | CHEWABLE_TABLET | ORAL | Status: DC

## 2011-09-18 MED ORDER — MORPHINE SULFATE 2 MG/ML IJ SOLN: 2.0000 mg | INTRAMUSCULAR | Status: DC | PRN

## 2011-09-18 MED ORDER — SODIUM CHLORIDE 0.9 % IJ SOLN: 3.0000 mL | INTRAMUSCULAR | Status: DC | PRN

## 2011-09-18 MED ORDER — SODIUM CHLORIDE 0.9 % IV SOLN: 250.0000 mL | INTRAVENOUS | Status: DC | PRN

## 2011-09-18 MED ORDER — HEPARIN BOLUS VIA INFUSION
3000.0000 [IU] | Freq: Once | INTRAVENOUS | Status: AC
  Administered 2011-09-18: 3000 [IU] via INTRAVENOUS
  Filled 2011-09-18: qty 3000

## 2011-09-18 MED ORDER — ASPIRIN 300 MG RE SUPP: 300.0000 mg | RECTAL | Status: AC

## 2011-09-18 MED ORDER — SODIUM CHLORIDE 0.9 % IV SOLN: INTRAVENOUS | Status: DC

## 2011-09-18 MED ORDER — ACETAMINOPHEN 325 MG PO TABS
650.0000 mg | ORAL_TABLET | ORAL | Status: DC | PRN
  Administered 2011-09-19: 650 mg via ORAL

## 2011-09-18 MED ORDER — SODIUM CHLORIDE 0.9 % IJ SOLN
3.0000 mL | Freq: Two times a day (BID) | INTRAMUSCULAR | Status: DC
  Administered 2011-09-19: 3 mL via INTRAVENOUS

## 2011-09-18 MED ORDER — ASPIRIN 81 MG PO CHEW: 324.0000 mg | CHEWABLE_TABLET | ORAL | Status: AC

## 2011-09-18 MED ORDER — HEPARIN SOD (PORCINE) IN D5W 100 UNIT/ML IV SOLN
1000.0000 [IU]/h | INTRAVENOUS | Status: DC
  Administered 2011-09-18: 1000 [IU]/h via INTRAVENOUS
  Filled 2011-09-18 (×2): qty 250

## 2011-09-18 MED ORDER — ONDANSETRON HCL 4 MG/2ML IJ SOLN: 4.0000 mg | Freq: Four times a day (QID) | INTRAMUSCULAR | Status: DC | PRN

## 2011-09-18 MED ORDER — ZOLPIDEM TARTRATE 5 MG PO TABS: 5.0000 mg | ORAL_TABLET | Freq: Every evening | ORAL | Status: DC | PRN

## 2011-09-18 MED ORDER — ASPIRIN EC 81 MG PO TBEC
81.0000 mg | DELAYED_RELEASE_TABLET | Freq: Every day | ORAL | Status: DC
  Filled 2011-09-18 (×2): qty 1

## 2011-09-18 NOTE — ED Notes (Signed)
[id number] Ready 

## 2011-09-18 NOTE — ED Provider Notes (Signed)
History     CSN: [id number]  Arrival date & time 09/18/11  1520   First MD Initiated Contact with Patient 09/18/11 1530      Chief Complaint  Patient presents with  . Chest Pain    (Consider location/radiation/quality/duration/timing/severity/associated sxs/prior treatment) HPI Comments: Patient presents to the ED from [facility] cardiology office.Patient has a history of CAD with stent placement, aortic stenosis, hyperlipidemia and a pacemaker. Patient was found to have inferior lead changes on EKG while in office.  Patient was complaining of chest pain that began about 10 o'clock this morning and lasted about 2 hours of location of pain is substernal.  Patient denies radiation.  Patient currently is not having any chest pain.  Patient received nitroglycerin times one by EMS in route and blood pressure dropped to systolic 90.  Patient did not receive aspirin because he is allergic.    Cardiologist is Dr. Ladona Ridgel from [facility]  ASA allergy   Patient is a 78 y.o. male presenting with chest pain.  Chest Pain Pertinent negatives for primary symptoms include no fever, no fatigue, no shortness of breath, no cough, no wheezing, no palpitations, no abdominal pain, no nausea, no vomiting and no dizziness.  Pertinent negatives for associated symptoms include no diaphoresis.     Past Medical History  Diagnosis Date  . CAD (coronary artery disease)     Remote stent to LAD in 1999. Last cath in 2004 and he is managed medically  . Anemia   . Hyperlipidemia   . Pacemaker     due to bradycardia; placed in 2004  . Aortic stenosis, moderate     Per echo in 2010  . BPH (benign prostatic hyperplasia)   . HTN (hypertension)   . Colon polyps   . Basal cell carcinoma of skin   . Arthritis   . Blood transfusion   . GERD (gastroesophageal reflux disease)   . Heart murmur     Past Surgical History  Procedure Date  . Pacemaker insertion 2004    [employer]tronic  . Coronary stent placement 1999    LAD   . Cardiac catheterization 2004    Managed medically  . Nasal hemorrhage control     Family History  Problem Relation Age of Onset  . Cancer Father     stomach  . Colon cancer Father   . Heart disease Brother   . Heart disease Brother     History  Substance Use Topics  . Smoking status: Former Smoker    Types: Cigarettes    Quit date: 09/15/1973  . Smokeless tobacco: Never Used  . Alcohol Use: No      Review of Systems  Constitutional: Negative for fever, chills, diaphoresis, activity change, fatigue and unexpected weight change.  HENT: Negative for congestion, neck pain and neck stiffness.   Eyes: Negative for visual disturbance.  Respiratory: Positive for chest tightness. Negative for apnea, cough, shortness of breath, wheezing and stridor.   Cardiovascular: Positive for chest pain. Negative for palpitations and leg swelling.  Gastrointestinal: Negative for nausea, vomiting, abdominal pain, diarrhea and blood in stool.  Genitourinary: Negative for dysuria, urgency, hematuria and flank pain.  Musculoskeletal: Negative for myalgias, back pain and gait problem.  Skin: Negative for pallor.  Neurological: Negative for dizziness, syncope, light-headedness and headaches.  All other systems reviewed and are negative.    Allergies  Aspirin  Home Medications   Current Outpatient Rx  Name Route Sig Dispense Refill  . ACETAMINOPHEN 325 MG  PO TABS Oral Take 650 mg by mouth every 6 (six) hours as needed.      Marland Kitchen AMLODIPINE BESYLATE 5 MG PO TABS Oral Take 1 tablet (5 mg total) by mouth daily. 30 tablet 4    Further refills by Dr. [name]  . CARDURA 4 MG PO TABS  TAKE 1 TABLET (4 MG TOTAL) BY MOUTH AT BEDTIME. 30 tablet 5  . CLOPIDOGREL BISULFATE 75 MG PO TABS Oral Take 75 mg by mouth daily.      . CRESTOR 5 MG PO TABS  TAKE 1 TABLET BY MOUTH EVERY DAY 30 tablet 6  . ESOMEPRAZOLE MAGNESIUM 40 MG PO CPDR Oral Take 40 mg by mouth daily before breakfast.      . FERROUS  SULFATE 325 (65 FE) MG PO TABS Oral Take 325 mg by mouth daily with breakfast.      . OLMESARTAN MEDOXOMIL 20 MG PO TABS Oral Take 20 mg by mouth daily.        BP 121/66  Pulse 77  Temp(Src) 96.7 F (35.9 C) (Oral)  Resp 19  SpO2 100%  Physical Exam  Nursing note and vitals reviewed. Constitutional: He is oriented to person, place, and time. He appears well-developed and well-nourished. No distress.  HENT:  Head: Normocephalic and atraumatic.  Eyes: EOM are normal. Pupils are equal, round, and reactive to light.       Normal appearance  Neck: Normal range of motion.  Cardiovascular: Normal rate, regular rhythm, intact distal pulses and normal pulses.  PMI is not displaced.  Exam reveals no S3, no S4, no distant heart sounds and no friction rub.   Murmur heard.  Systolic murmur is present with a grade of 4/6  Pulmonary/Chest: Effort normal and breath sounds normal. No respiratory distress. He exhibits no tenderness.  Abdominal: Soft. Bowel sounds are normal. He exhibits no distension. There is no tenderness. There is no guarding.  Musculoskeletal: He exhibits no edema and no tenderness.       2+ Dorsalis Pedis pulses  Neurological: He is alert and oriented to person, place, and time.  Skin: Skin is warm and dry. No rash noted. He is not diaphoretic.  Psychiatric: He has a normal mood and affect. His behavior is normal.    ED Course  Procedures (including critical care time)   Labs Reviewed  CARDIAC PANEL(CRET KIN+CKTOT+MB+TROPI)  COMPREHENSIVE METABOLIC PANEL  CBC  DIFFERENTIAL   No results found.   No diagnosis found.   Date: 09/18/2011  Rate: 84  Rhythm: normal sinus rhythm  QRS Axis: normal  Intervals: normal  ST/T Wave abnormalities: nonspecific T wave changes of inferior leads, no ST elevation   Conduction Disutrbances:none  Narrative Interpretation:   Old EKG Reviewed: changes noted; comparison EKG from 2000 does not show depressed T waves   Pt being  admitted by [facility]ology   MDM  Chest Pain to admit for new t wave inversion located in inferior leads        [place], [place] 09/18/11 1607

## 2011-09-18 NOTE — ED Provider Notes (Signed)
History     CSN: [id number]  Arrival date & time 09/18/11  1520   First MD Initiated Contact with Patient 09/18/11 1530      Chief Complaint  Patient presents with  . Chest Pain    (Consider location/radiation/quality/duration/timing/severity/associated sxs/prior treatment) HPI  Past Medical History  Diagnosis Date  . CAD (coronary artery disease)     Remote stent to LAD in 1999. Last cath in 2004 and he is managed medically  . Anemia   . Hyperlipidemia   . Pacemaker     due to bradycardia; placed in 2004  . Aortic stenosis, moderate     Per echo in 2010  . BPH (benign prostatic hyperplasia)   . HTN (hypertension)   . Colon polyps   . Basal cell carcinoma of skin   . Blood transfusion   . GERD (gastroesophageal reflux disease)   . Heart murmur   . Hemorrhoid   . Complication of anesthesia   . Angina   . Dysrhythmia     paced  . Arthritis 09/18/11    "in my knees"    Past Surgical History  Procedure Date  . Nasal hemorrhage control 1980's  . Insert / replace / remove pacemaker 2004    initial placement; [employer]tr[employer]  . Insert / replace / remove pacemaker 02/14/2011  . Cardiac catheterization 2004    Managed medically  . Coronary angioplasty with stent placement 05/09/1998    "1"; LAD  . Tonsillectomy     "when I was a teenager"  . Skin cancer excision 09/18/11    "have had a couple hundred of them removed since I was in my 40's"    Family History  Problem Relation Age of Onset  . Cancer Father     stomach  . Colon cancer Father   . Heart disease Brother   . Heart disease Brother     History  Substance Use Topics  . Smoking status: Former Smoker -- 1.0 packs/day for 42 years    Types: Cigarettes    Quit date: 09/15/1973  . Smokeless tobacco: Former Neurosurgeon    Types: Chew  . Alcohol Use: No      Review of Systems  Allergies  Aspirin  Home Medications  No current outpatient prescriptions on file.  BP 124/74  Pulse 60  Temp(Src) 98.2 F (36.8 C)  (Oral)  Resp 13  Ht 5\' 5"  (1.651 m)  Wt 165 lb (74.844 kg)  BMI 27.46 kg/m2  SpO2 98%  Physical Exam  ED Course  Procedures (including critical care time)  Labs Reviewed  COMPREHENSIVE METABOLIC PANEL - Abnormal; Notable for the following:    Glucose, Bld 125 (*)    Albumin 3.3 (*)    Total Bilirubin 0.2 (*)    GFR calc non Af Amer 46 (*)    GFR calc Af Amer 54 (*)    All other components within normal limits  CBC - Abnormal; Notable for the following:    RBC 3.06 (*)    Hemoglobin 9.2 (*)    HCT 28.5 (*)    All other components within normal limits  GLUCOSE, CAPILLARY - Abnormal; Notable for the following:    Glucose-Capillary 129 (*)    All other components within normal limits  CARDIAC PANEL(CRET KIN+CKTOT+MB+TROPI)  DIFFERENTIAL  PROTIME-INR  APTT  TSH  MAGNESIUM  BRAIN NATRIURETIC PEPTIDE  HEMOGLOBIN A1C  HEPARIN LEVEL (UNFRACTIONATED)  CBC  BASIC METABOLIC PANEL  LIPID PANEL  MRSA PCR SCREENING  MRSA  PCR SCREENING   X-ray Chest Pa And Lateral  09/18/2011  *RADIOLOGY REPORT*  Clinical Data: Chest pain.  Symptoms since 10 o'clock this morning.  CHEST - 2 VIEW  Comparison: 02/14/2011  Findings: The patient has right-sided transvenous pacemaker.  Leads overlie the right atrium and right ventricle.  The heart is mildly enlarged.  The film is made with shallow inflation.  The there is subsegmental atelectasis at both lung bases, left greater than right.  No focal consolidations or pleural effusions.  No overt edema.  IMPRESSION:  1.  Cardiomegaly. 2.  Bibasilar atelectasis, left greater than right.  Original Report Authenticated By: Patterson Hammersmith, M.D.     1. Chest pain   2. ECG abnormal       MDM   Medical screening examination/treatment/procedure(s) were conducted as a shared visit with non-physician practitioner(s) and myself.  I personally evaluated the patient during the encounter Patient with new-onset chest pain he was seen in the cardiology clinic  with T-wave inversion inferiorly. Well-appearing and cardiology coming to see patient        Gwyneth Sprout, MD 09/18/11 [id number]

## 2011-09-18 NOTE — Progress Notes (Signed)
Patient ID: Jordan [name], male   DOB: 12-14-1917, 78 y.o.   MRN: [id number] HPI Jordan [name] is a 78 year old gentleman who came to the office today with substernal chest pressure since 10 AM. When he arrived the office, he was still having discomfort.  We sat him down and gave him a nitroglycerin. He thought that his blood pressure. He was resuscitated with fluids and oxygen. His symptoms are relieved at present.  His past medical history is significant for coronary artery disease. Remote stent placed in his LAD in 1999. His last catheterization was in 2004. He has been managed medically.  He also has moderate aortic stenosis. His last echocardiogram was in 2010. He also is a pacemaker placed by Dr. Ladona Ridgel for bradycardia. This was put in and 2004.  Though he is 78, he is remarkably active and still plays golf every Saturday.  Past Medical History  Diagnosis Date  . CAD (coronary artery disease)     Remote stent to LAD in 1999. Last cath in 2004 and he is managed medically  . Anemia   . Hyperlipidemia   . Pacemaker     due to bradycardia; placed in 2004  . Aortic stenosis, moderate     Per echo in 2010  . BPH (benign prostatic hyperplasia)   . HTN (hypertension)   . Colon polyps   . Basal cell carcinoma of skin   . Blood transfusion   . GERD (gastroesophageal reflux disease)   . Heart murmur   . Hemorrhoid   . Complication of anesthesia   . Angina   . Dysrhythmia     paced  . Arthritis 09/18/11    "in my knees"    Current Facility-Administered Medications  Medication Dose Route Frequency Provider Last Rate Last Dose  . acetaminophen (TYLENOL) tablet 650 mg  650 mg Oral Q4H PRN Darrol Jump, PA      . ALPRAZolam Prudy Feeler) tablet 0.25 mg  0.25 mg Oral BID PRN Darrol Jump, PA      . aspirin chewable tablet 324 mg  324 mg Oral NOW Darrol Jump, PA       Or  . aspirin suppository 300 mg  300 mg Rectal NOW Darrol Jump, PA      . aspirin EC tablet 81 mg   81 mg Oral Daily Joline Salt Barrett, PA      . heparin ADULT infusion 100 units/ml (25000 units/250 ml)  1,000 Units/hr Intravenous Continuous Eugene Garnet, PHARMD      . heparin bolus via infusion 3,000 Units  3,000 Units Intravenous Once Eugene Garnet, PHARMD      . morphine 2 MG/ML injection 2 mg  2 mg Intravenous Q1H PRN Darrol Jump, PA      . ondansetron (ZOFRAN) injection 4 mg  4 mg Intravenous Q6H PRN Darrol Jump, PA      . zolpidem (AMBIEN) tablet 5 mg  5 mg Oral QHS PRN Darrol Jump, PA       Current Outpatient Prescriptions  Medication Sig Dispense Refill  . acetaminophen (TYLENOL) 325 MG tablet Take 650 mg by mouth every 6 (six) hours as needed. For fever      . amLODipine (NORVASC) 5 MG tablet Take 1 tablet (5 mg total) by mouth daily.  30 tablet  4  . clopidogrel (PLAVIX) 75 MG tablet Take 75 mg by mouth daily.        Marland Kitchen doxazosin (CARDURA) 4 MG tablet  Take 4 mg by mouth at bedtime.        Marland Kitchen esomeprazole (NEXIUM) 40 MG capsule Take 40 mg by mouth daily before breakfast.        . ferrous sulfate 325 (65 FE) MG tablet Take 325 mg by mouth daily with breakfast.        . olmesartan (BENICAR) 20 MG tablet Take 20 mg by mouth daily.        . rosuvastatin (CRESTOR) 5 MG tablet Take 5 mg by mouth at bedtime.          Allergies  Allergen Reactions  . Aspirin     Stomach bleeds    Family History  Problem Relation Age of Onset  . Cancer Father     stomach  . Colon cancer Father   . Heart disease Brother   . Heart disease Brother     History   Social History  . Marital Status: Married    Spouse Name: N/A    Number of Children: 2  . Years of Education: N/A   Occupational History  . Retired    Social History Main Topics  . Smoking status: Former Smoker -- 1.0 packs/day for 42 years    Types: Cigarettes    Quit date: 09/15/1973  . Smokeless tobacco: Former Neurosurgeon    Types: Chew  . Alcohol Use: No  . Drug Use: No  . Sexually Active: Yes    Other Topics Concern  . Not on file   Social History Narrative  . No narrative on file    ROS ALL NEGATIVE EXCEPT THOSE NOTED IN HPI  PE  General Appearance: well developed, well nourished in distress., he is pale, looks younger than stated age. HEENT: symmetrical face, PERRLA, upper plate  Neck: no JVD, thyromegaly, or adenopathy, trachea midline Chest: symmetric without deformity Cardiac: PMI non-displaced, RRR, normal S1, S2, no gallopcomment 3/6 aortic stenosis murmur Lung: clear to ausculation and percussion Vascular: all pulses full without bruits  Abdominal: nondistended, nontender, good bowel sounds, no HSM, no bruits Extremities: no cyanosis, clubbing or edema, no sign of DVT, no varicosities  Skin: normal color, no rashes Neuro: alert and oriented x 3, non-focal Pysch: normal affect  EKG Normal sinus rhythm, first-degree AV block, new T-wave inversion in 2, 3 and aVF. BMET    Component Value Date/Time   NA 139 09/18/2011 1609   K 4.3 09/18/2011 1609   CL 104 09/18/2011 1609   CO2 26 09/18/2011 1609   GLUCOSE 125* 09/18/2011 1609   BUN 23 09/18/2011 1609   CREATININE 1.30 09/18/2011 1609   CALCIUM 8.9 09/18/2011 1609   GFRNONAA 46* 09/18/2011 1609   GFRAA 54* 09/18/2011 1609    Lipid Panel  No results found for this basename: chol, trig, hdl, cholhdl, vldl, ldlcalc    CBC    Component Value Date/Time   WBC 7.5 09/18/2011 1609   RBC 3.06* 09/18/2011 1609   HGB 9.2* 09/18/2011 1609   HCT 28.5* 09/18/2011 1609   PLT 177 09/18/2011 1609   MCV 93.1 09/18/2011 1609   MCH 30.1 09/18/2011 1609   MCHC 32.3 09/18/2011 1609   RDW 12.7 09/18/2011 1609   LYMPHSABS 1.7 09/18/2011 1609   MONOABS 0.6 09/18/2011 1609   EOSABS 0.2 09/18/2011 1609   BASOSABS 0.0 09/18/2011 1609   Assessment and Plan:  1) Acute Coronary Syndrome, rule out non-Q-wave infarction. 2) History of Coronary Artery Disease, status post LAD stent in 1999 3) Moderate aortic stenosis by  echocardiogram 2000 , suspect  progression 4) Chronic Anemia, with history of iron deficiency. 5) History of Bradycardia, status post pacemaker in 2004.  The patient will be [facility] to [facility] CCU. I transferred him from the office by EMS. Will place on IV heparin, check serial enzymes, and tentatively placed on the catheter schedule for tomorrow. He has been managed medically at this point however we may be able to find the culprit lesion that we can help him with. We'll also obtain a 2-D echocardiogram to reassess his aortic stenosis.  I discussed this with the patient. He agrees with being admitted and understands the circumstances. He left the office in stable condition.

## 2011-09-18 NOTE — Progress Notes (Signed)
ANTICOAGULATION CONSULT NOTE - Initial Consult  Pharmacy Consult for heparin Indication: chest pain/ACS  Allergies  Allergen Reactions  . Aspirin     Stomach bleeds    Patient Measurements: Height: 5\' 5"  (165.1 cm) Weight: 165 lb (74.844 kg) IBW/kg (Calculated) : 61.5  Adjusted Body Weight:   Vital Signs: Temp: 96.7 F (35.9 C) (01/03 1540) Temp src: Oral (01/03 1540) BP: 121/66 mmHg (01/03 1540) Pulse Rate: 77  (01/03 1540)  Labs:  Basename 09/18/11 1609  HGB 9.2*  HCT 28.5*  PLT 177  APTT --  LABPROT --  INR --  HEPARINUNFRC --  CREATININE --  CKTOTAL --  CKMB --  TROPONINI --   Estimated Creatinine Clearance: 37.9 ml/min (by C-G formula based on Cr of 1.2).  Medical History: Past Medical History  Diagnosis Date  . CAD (coronary artery disease)     Remote stent to LAD in 1999. Last cath in 2004 and he is managed medically  . Anemia   . Hyperlipidemia   . Pacemaker     due to bradycardia; placed in 2004  . Aortic stenosis, moderate     Per echo in 2010  . BPH (benign prostatic hyperplasia)   . HTN (hypertension)   . Colon polyps   . Basal cell carcinoma of skin   . Blood transfusion   . GERD (gastroesophageal reflux disease)   . Heart murmur   . Hemorrhoid   . Complication of anesthesia   . Angina   . Dysrhythmia     paced  . Arthritis 09/18/11    "in my knees"    Medications:  Scheduled:    . aspirin  324 mg Oral NOW   Or  . aspirin  300 mg Rectal NOW  . aspirin EC  81 mg Oral Daily  . heparin  3,000 Units Intravenous Once    Assessment: 78 yr old male admitted from MD office with chest pain. Goal of Therapy:  Heparin level 0.3-0.7 units/ml   Plan:  3000 units bolus, 1000 units/hr. Heparin level in 8 hrs, then daily heparin level and CBC.  Eugene Garnet 09/18/2011,4:52 PM

## 2011-09-18 NOTE — ED Notes (Signed)
Environmental education officer paged

## 2011-09-18 NOTE — ED Notes (Signed)
Pt has had indigestion for the last 4 days and then at 1000 started having chest pain and went to office.  Treated with nitro times one and dropped bp to 90s.  Pt is allergic to asa.  Sent here for inferior lateral ekg changes.  Pt is alert and pain free.

## 2011-09-18 NOTE — ED Notes (Signed)
[id number] Ready

## 2011-09-19 ENCOUNTER — Encounter (HOSPITAL_COMMUNITY)
Admission: EM | Disposition: A | Discharge status: Home / Self Care | Payer: Self-pay | Source: Home / Self Care | Attending: Cardiology

## 2011-09-19 DIAGNOSIS — I2510 Atherosclerotic heart disease of native coronary artery without angina pectoris: Secondary | ICD-10-CM

## 2011-09-19 HISTORY — PX: CORONARY ANGIOGRAM: SHX5466

## 2011-09-19 LAB — GLUCOSE, CAPILLARY: Glucose-Capillary: 119 mg/dL — ABNORMAL HIGH (ref 70–99)

## 2011-09-19 LAB — HEMOGLOBIN A1C
Hgb A1c MFr Bld: 5.5 % (ref <5.7)
Mean Plasma Glucose: 111 mg/dL (ref <117)

## 2011-09-19 LAB — LIPID PANEL
HDL: 38 mg/dL — ABNORMAL LOW (ref >39)
LDL Cholesterol: 53 mg/dL (ref 0–99)
Triglycerides: 66 mg/dL (ref <150)

## 2011-09-19 LAB — BASIC METABOLIC PANEL
Calcium: 9 mg/dL (ref 8.4–10.5)
Creatinine, Ser: 1.17 mg/dL (ref 0.50–1.35)
GFR calc Af Amer: 61 mL/min — ABNORMAL LOW (ref >90)
Sodium: 139 mEq/L (ref 135–145)

## 2011-09-19 LAB — CBC
MCH: 29.4 pg (ref 26.0–34.0)
MCV: 93 fL (ref 78.0–100.0)
Platelets: 166 10*3/uL (ref 150–400)
RDW: 12.7 % (ref 11.5–15.5)
WBC: 8.2 10*3/uL (ref 4.0–10.5)

## 2011-09-19 LAB — ABO/RH: ABO/RH(D): O NEG

## 2011-09-19 SURGERY — CORONARY ANGIOGRAM

## 2011-09-19 MED ORDER — ACETAMINOPHEN 325 MG PO TABS
ORAL_TABLET | ORAL | Status: AC
  Filled 2011-09-19: qty 2

## 2011-09-19 MED ORDER — FUROSEMIDE 10 MG/ML IJ SOLN
INTRAMUSCULAR | Status: AC
  Filled 2011-09-19: qty 4

## 2011-09-19 MED ORDER — DIAZEPAM 5 MG PO TABS
5.0000 mg | ORAL_TABLET | Freq: Once | ORAL | Status: AC
  Administered 2011-09-19: 5 mg via ORAL

## 2011-09-19 MED ORDER — ROSUVASTATIN CALCIUM 5 MG PO TABS
5.0000 mg | ORAL_TABLET | Freq: Every day | ORAL | Status: DC
  Administered 2011-09-19: 5 mg via ORAL
  Filled 2011-09-19 (×2): qty 1

## 2011-09-19 MED ORDER — SODIUM CHLORIDE 0.9 % IJ SOLN
3.0000 mL | Freq: Two times a day (BID) | INTRAMUSCULAR | Status: DC
  Administered 2011-09-19 – 2011-09-20 (×2): 3 mL via INTRAVENOUS

## 2011-09-19 MED ORDER — FUROSEMIDE 10 MG/ML IJ SOLN
20.0000 mg | Freq: Once | INTRAMUSCULAR | Status: AC
  Administered 2011-09-19: 20 mg via INTRAVENOUS
  Filled 2011-09-19: qty 2

## 2011-09-19 MED ORDER — ONDANSETRON HCL 4 MG/2ML IJ SOLN: 4.0000 mg | Freq: Four times a day (QID) | INTRAMUSCULAR | Status: DC | PRN

## 2011-09-19 MED ORDER — FERROUS SULFATE 325 (65 FE) MG PO TABS
325.0000 mg | ORAL_TABLET | Freq: Every day | ORAL | Status: DC
  Administered 2011-09-20: 325 mg via ORAL
  Filled 2011-09-19 (×2): qty 1

## 2011-09-19 MED ORDER — CLOPIDOGREL BISULFATE 75 MG PO TABS
75.0000 mg | ORAL_TABLET | Freq: Every day | ORAL | Status: DC
Start: 2011-09-19 — End: 2011-09-20
  Administered 2011-09-19 – 2011-09-20 (×2): 75 mg via ORAL
  Filled 2011-09-19 (×2): qty 1

## 2011-09-19 MED ORDER — SODIUM CHLORIDE 0.9 % IJ SOLN: 3.0000 mL | INTRAMUSCULAR | Status: DC | PRN

## 2011-09-19 MED ORDER — MUPIROCIN 2 % EX OINT
1.0000 "application " | TOPICAL_OINTMENT | Freq: Two times a day (BID) | CUTANEOUS | Status: DC
  Administered 2011-09-19 (×2): 1 via NASAL
  Filled 2011-09-19: qty 22

## 2011-09-19 MED ORDER — HEPARIN (PORCINE) IN NACL 2-0.9 UNIT/ML-% IJ SOLN
INTRAMUSCULAR | Status: AC
  Filled 2011-09-19: qty 2000

## 2011-09-19 MED ORDER — CHLORHEXIDINE GLUCONATE CLOTH 2 % EX PADS
6.0000 | MEDICATED_PAD | Freq: Every day | CUTANEOUS | Status: DC
  Administered 2011-09-20: 6 via TOPICAL

## 2011-09-19 MED ORDER — AMLODIPINE BESYLATE 5 MG PO TABS
5.0000 mg | ORAL_TABLET | Freq: Every day | ORAL | Status: DC
  Administered 2011-09-19 – 2011-09-20 (×2): 5 mg via ORAL
  Filled 2011-09-19 (×2): qty 1

## 2011-09-19 MED ORDER — PANTOPRAZOLE SODIUM 40 MG PO TBEC
40.0000 mg | DELAYED_RELEASE_TABLET | Freq: Every day | ORAL | Status: DC
  Administered 2011-09-19 – 2011-09-20 (×2): 40 mg via ORAL
  Filled 2011-09-19 (×2): qty 1

## 2011-09-19 MED ORDER — NITROGLYCERIN 0.2 MG/ML ON CALL CATH LAB
INTRAVENOUS | Status: AC
  Filled 2011-09-19: qty 1

## 2011-09-19 MED ORDER — FENTANYL CITRATE 0.05 MG/ML IJ SOLN
INTRAMUSCULAR | Status: AC
  Filled 2011-09-19: qty 2

## 2011-09-19 MED ORDER — LIDOCAINE HCL (PF) 1 % IJ SOLN
INTRAMUSCULAR | Status: AC
  Filled 2011-09-19: qty 30

## 2011-09-19 MED ORDER — SODIUM CHLORIDE 0.9 % IV SOLN: 250.0000 mL | INTRAVENOUS | Status: DC | PRN

## 2011-09-19 MED ORDER — OXYCODONE-ACETAMINOPHEN 5-325 MG PO TABS: 1.0000 | ORAL_TABLET | ORAL | Status: DC | PRN

## 2011-09-19 NOTE — Progress Notes (Signed)
ANTICOAGULATION CONSULT NOTE - Follow Up Consult  Pharmacy Consult for heparin Indication: chest pain/ACS  Allergies  Allergen Reactions  . Aspirin     Stomach bleeds    Patient Measurements: Height: 5\' 5"  (165.1 cm) Weight: 165 lb (74.844 kg) IBW/kg (Calculated) : 61.5   Vital Signs: Temp: 97.5 F (36.4 C) (01/04 0016) Temp src: Oral (01/04 0016) BP: 139/60 mmHg (01/04 0200) Pulse Rate: 60  (01/04 0200)  Labs:  Basename 09/19/11 0135 09/18/11 1609 09/18/11 1603  HGB 8.8* 9.2* --  HCT 27.8* 28.5* --  PLT 166 177 --  APTT -- 36 --  LABPROT -- 14.4 --  INR -- 1.10 --  HEPARINUNFRC 0.50 -- --  CREATININE 1.17 1.30 --  CKTOTAL -- -- 57  CKMB -- -- 2.3  TROPONINI -- -- <0.30   Estimated Creatinine Clearance: 38.9 ml/min (by C-G formula based on Cr of 1.17).   Medications:  Scheduled:    . aspirin  324 mg Oral NOW   Or  . aspirin  300 mg Rectal NOW  . aspirin  324 mg Oral Pre-Cath  . aspirin EC  81 mg Oral Daily  . diazepam  5 mg Oral On Call  . heparin  3,000 Units Intravenous Once  . mupirocin ointment   Nasal BID  . sodium chloride  3 mL Intravenous Q12H   Infusions:    . sodium chloride    . heparin 1,000 Units/hr (09/18/11 2300)    Assessment: 78yo male therapeutic on heparin with initial dosing for CP.  Goal of Therapy:  Heparin level 0.3-0.7 units/ml   Plan:  Will continue heparin at current rate and confirm stable with additional level.  Colleen Can PharmD BCPS 09/19/2011,2:49 AM

## 2011-09-19 NOTE — Progress Notes (Signed)
Pt received 2 units of PRBCs in Cath lab. Upon arrival to 2900, pt had 2nd unit of blood infusing. Previous documentation of blood administration was done via cath lab documentation system according to Abby, RN (nurse from cath lab). 2nd unit of PRBC completed at 1800. Ending V/S: T 97.8, 142/68, HR 75, RR 18. No s/s of blood-transfusion reaction. Total of 350cc infused. Jordan [name]

## 2011-09-19 NOTE — Progress Notes (Signed)
Patient ID: Jordan [name], male   DOB: June 02, 1918, 78 y.o.   MRN: [id number] @ Subjective:  Denies SSCP, palpitations or Dyspnea Had fall out in office after getting nitro.  Increasing SSCP at home Not a surgical candidate for severe AS  Objective:  Filed Vitals:   09/19/11 0500 09/19/11 0600 09/19/11 0700 09/19/11 0735  BP: 133/60 122/50 131/55   Pulse: 65 60 65   Temp:    97.9 F (36.6 C)  TempSrc:    Oral  Resp: 15 13 16    Height:      Weight:      SpO2: 99% 99% 99%     Intake/Output from previous day:  Intake/Output Summary (Last 24 hours) at 09/19/11 0830 Last data filed at 09/19/11 0700  Gross per 24 hour  Intake    240 ml  Output   1250 ml  Net  -1010 ml    Physical Exam: General appearance: alert and no distress Lungs: clear to auscultation bilaterally Heart: Severe AS Abdomen: soft, non-tender; bowel sounds normal; no masses,  no organomegaly Extremities: extremities normal, atraumatic, no cyanosis or edema Pulses: 2+ and symmetric Neurologic: Grossly normal  Lab Results: Basic Metabolic Panel:  Basename 09/19/11 0135 09/18/11 1609  NA 139 139  K 4.1 4.3  CL 105 104  CO2 29 26  GLUCOSE 97 125*  BUN 20 23  CREATININE 1.17 1.30  CALCIUM 9.0 8.9  MG -- 1.9  PHOS -- --   Liver Function Tests:  Basename 09/18/11 1609  AST 22  ALT 16  ALKPHOS 55  BILITOT 0.2*  PROT 7.1  ALBUMIN 3.3*   No results found for this basename: LIPASE:2,AMYLASE:2 in the last 72 hours CBC:  Basename 09/19/11 0135 09/18/11 1609  WBC 8.2 7.5  NEUTROABS -- 5.0  HGB 8.8* 9.2*  HCT 27.8* 28.5*  MCV 93.0 93.1  PLT 166 177   Cardiac Enzymes:  Basename 09/18/11 1603  CKTOTAL 57  CKMB 2.3  CKMBINDEX --  TROPONINI <0.30   BNP: No components found with this basename: POCBNP:3 D-Dimer: No results found for this basename: DDIMER:2 in the last 72 hours Hemoglobin A1C: No results found for this basename: HGBA1C in the last 72 hours Fasting Lipid  Panel:  Basename 09/19/11 0135  CHOL 104  HDL 38*  LDLCALC 53  TRIG 66  CHOLHDL 2.7  LDLDIRECT --   Thyroid Function Tests:  Basename 09/18/11 1609  TSH 0.797  T4TOTAL --  T3FREE --  THYROIDAB --   Anemia Panel: No results found for this basename: VITAMINB12,FOLATE,FERRITIN,TIBC,IRON,RETICCTPCT in the last 72 hours  Imaging: X-ray Chest Pa And Lateral  09/18/2011  *RADIOLOGY REPORT*  Clinical Data: Chest pain.  Symptoms since 10 o'clock this morning.  CHEST - 2 VIEW  Comparison: 02/14/2011  Findings: The patient has right-sided transvenous pacemaker.  Leads overlie the right atrium and right ventricle.  The heart is mildly enlarged.  The film is made with shallow inflation.  The there is subsegmental atelectasis at both lung bases, left greater than right.  No focal consolidations or pleural effusions.  No overt edema.  IMPRESSION:  1.  Cardiomegaly. 2.  Bibasilar atelectasis, left greater than right.  Original Report Authenticated By: Patterson Hammersmith, M.D.    Cardiac Studies:   Telemetry:  V pacing    Medications:     . aspirin  324 mg Oral NOW   Or  . aspirin  300 mg Rectal NOW  . aspirin  324 mg Oral Pre-Cath  .  aspirin EC  81 mg Oral Daily  . diazepam  5 mg Oral On Call  . heparin  3,000 Units Intravenous Once  . mupirocin ointment   Nasal BID  . sodium chloride  3 mL Intravenous Q12H       . sodium chloride    . heparin 1,000 Units/hr (09/18/11 2300)    Assessment/Plan:  AS:  Not a surgical candidate  Likely contributed to passing out when given nitro in office SSCP:  History of CAD  Cath today  Cors only No need to cross valve  Charlton Haws 09/19/2011, 8:30 AM

## 2011-09-19 NOTE — Op Note (Signed)
Catheterization  Indication: Aortric stenosis with chest pain.  Given nitro in office yesterday and passed out.  Not a candidate for AVR  Procedure: After informed consent and clinical "time out" the right groin was prepped and draped in a sterile fashion.  A 5Fr sheath was placed in the right femoral artery using seldinger technique and local lidocaine.  There was a sheath disection so we had to put a sheath in the left femoral artery as well  Standard JL4,  And JR4 catheters were used to engage the coronary arteries.  Coronary arteries were visualized in orthogonal views using caudal and cranial angulation.    Medications:   Versed: 0 mg's  Fentanyl: 12.5  ug's  Coronary Arteries: Right dominant with no anomalies  LM: 20% distal  LAD:  Calcified.  30% proximal  40% mid  Normal distal wraps apex  IM: large and normal  D1: 80% ostial calcified and small vessel  D2: normal large vessel  Circumflex:  Normal    OM1- normal  OM2: Normal  RCA: Dominant 30% proximal   Hemodynamics:  Aortic Pressure: 1178 65  mmHg    Impression:  Coronary arteries look fairly good.  Ostial lesion in small diagonal is not amenable to PCI/Stent Medical Rx  Chest pains more likely from AS.  Avoid nitro in future with severe AS and history of "syncope" When using it  Charlton Haws 09/19/2011 11:09 AM

## 2011-09-19 NOTE — Brief Op Note (Signed)
See operative note  Chrisean Kloth  

## 2011-09-19 NOTE — Progress Notes (Signed)
MD Turner notified of patient allergy to ASA, per MD ok to administer morning dose pre cath lab.

## 2011-09-20 ENCOUNTER — Encounter (HOSPITAL_COMMUNITY): Payer: Self-pay | Admitting: Internal Medicine

## 2011-09-20 DIAGNOSIS — I2510 Atherosclerotic heart disease of native coronary artery without angina pectoris: Secondary | ICD-10-CM

## 2011-09-20 LAB — CBC
Hemoglobin: 10.8 g/dL — ABNORMAL LOW (ref 13.0–17.0)
MCH: 29.6 pg (ref 26.0–34.0)
MCV: 90.4 fL (ref 78.0–100.0)
RBC: 3.65 MIL/uL — ABNORMAL LOW (ref 4.22–5.81)
WBC: 9.4 10*3/uL (ref 4.0–10.5)

## 2011-09-20 LAB — TYPE AND SCREEN
ABO/RH(D): O NEG
Unit division: 0

## 2011-09-20 LAB — BASIC METABOLIC PANEL
CO2: 29 mEq/L (ref 19–32)
Calcium: 9 mg/dL (ref 8.4–10.5)
Chloride: 101 mEq/L (ref 96–112)
Glucose, Bld: 91 mg/dL (ref 70–99)
Sodium: 138 mEq/L (ref 135–145)

## 2011-09-20 MED ORDER — ASPIRIN 81 MG PO TBEC: 81.0000 mg | DELAYED_RELEASE_TABLET | Freq: Every day | ORAL | Status: DC

## 2011-09-20 NOTE — Discharge Summary (Signed)
Physician Discharge Summary  Patient ID: Jordan [name] MRN: [id number] DOB/AGE: [birth year] 76 y.o.  Admit date: 09/18/2011 Discharge date: 09/20/2011  Primary Cardiologist: Jordan Dach, MD  Primary Discharge Diagnosis: 1  CAD  A  nonobstructive disease by cardiac catheterization 2  Presyncope/syncope  A  secondary to nitroglycerin  3  Aortic stenosis, severe  A  not suitable surgical candidate  4  AVM, ascending colon  A  by recent upper endoscopy   Secondary Discharge Diagnoses: CAD (coronary artery disease)   Remote stent to LAD in 1999. 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  Procedures: Cardiac catheterization  Hospital Course: Following direct admission, patient had no further complaint of chest pain. Recommendation was to proceed with coronary angiography (cors only), which yielded nonobstructive CAD with a small 80% ostial diagonal lesion, not amenable to PCI/stenting. Continue medical therapy was recommended. Dr. Eden [name] concluded that the patient's CP with most likely secondary to severe AS, and recommended not using NTG in the future, given history of "syncope". Continue medical therapy was recommended.  At time of discharge, Dr. Graciela [name] question as to why patient remains on Plavix, now nearly 10 years post stenting. Patient does have documented history of aspirin "allergy", secondary to history of upper  GIB. It was also noted that patient had a recent upper endoscopy, revealing AVMs.   Patient was cleared for discharge in hemodynamically stable condition, with no noted complications of the right groin incision site. He was cleared with instructions to resume all previous home medications.  Discharge Vitals: Blood pressure 125/54, pulse 71, temperature 98.1 F (36.7 C), temperature source Oral, resp. rate 19, height 5\' 5"  (1.651 m), weight 160 lb 0.9 oz (72.6 kg), SpO2 92.00%.  Labs:   Lab Results  Component Value Date   WBC 9.4 09/20/2011   HGB 10.8* 09/20/2011   HCT 33.0* 09/20/2011   MCV 90.4 09/20/2011   PLT 170 09/20/2011    Lab 09/20/11 0625 09/18/11 1609  NA 138 --  K 4.1 --  CL 101 --  CO2 29 --  BUN 19 --  CREATININE 1.34 --  CALCIUM 9.0 --  PROT -- 7.1  BILITOT -- 0.2*  ALKPHOS -- 55  ALT -- 16  AST -- 22  GLUCOSE 91 --   Lab Results  Component Value Date   CKTOTAL 57 09/18/2011   CKMB 2.3 09/18/2011   TROPONINI <0.30 09/18/2011    Lab Results  Component Value Date   CHOL 104 09/19/2011   Lab Results  Component Value Date   HDL 38* 09/19/2011   Lab Results  Component Value Date   LDLCALC 53 09/19/2011   Lab Results  Component Value Date   TRIG 66 09/19/2011   Lab Results  Component Value Date   CHOLHDL 2.7 09/19/2011   No results found for this basename: LDLDIRECT  DISPOSITION: Stable condition  FOLLOW UP PLANS AND APPOINTMENTS: Discharge Orders    Future Orders Please Complete By Expires   Diet - low sodium heart healthy      Increase activity slowly        Follow-up Information    Follow up with [name], Jordan. Make an appointment in 1 week.      Follow up with Jordan Bollman, MD in 2 weeks. (office will call with appointment)    Contact information:   [address], [address] [place] [address] [phone]          DISCHARGE MEDICATIONS: Current Discharge Medication List    START taking these  medications   Details  aspirin EC 81 MG EC tablet Take 1 tablet (81 mg total) by mouth daily.      CONTINUE these medications which have NOT CHANGED   Details  acetaminophen (TYLENOL) 325 MG tablet Take 650 mg by mouth every 6 (six) hours as needed. For fever    amLODipine (NORVASC) 5 MG tablet Take 1 tablet (5 mg total) by mouth daily. Qty: 30 tablet, Refills: 4    clopidogrel (PLAVIX) 75 MG tablet Take 75 mg by mouth daily.      esomeprazole (NEXIUM) 40 MG capsule Take 40 mg by mouth daily before breakfast.      ferrous sulfate 325 (65 FE) MG tablet Take 325 mg by mouth daily with breakfast.      rosuvastatin (CRESTOR) 5 MG tablet Take 5 mg by mouth at bedtime.        STOP taking these medications     doxazosin (CARDURA) 4 MG tablet      olmesartan (BENICAR) 20 MG tablet         BRING ALL MEDICATIONS WITH YOU TO FOLLOW UP APPOINTMENTS  Time spent with patient to include physician time: Greater than 30 minutes, including physician time.  Signed: Gene [name] 09/20/2011, 12:10 PM Co-Sign MD  I have reviewed assessment and discharge plans. No additional input or changes.

## 2011-09-20 NOTE — Progress Notes (Signed)
Patient Name: Jordan [name]      SUBJECTIVE: Without complaints.  Past Medical History  Diagnosis Date  . CAD (coronary artery disease)     Remote stent to LAD in 1999. Last cath in 2004 and he is managed medically  . Anemia   . Hyperlipidemia   . Pacemaker     due to bradycardia; placed in 2004  . Aortic stenosis, moderate     Per echo in 2010  . BPH (benign prostatic hyperplasia)   . HTN (hypertension)   . Colon polyps   . Basal cell carcinoma of skin   . Blood transfusion   . GERD (gastroesophageal reflux disease)   . Heart murmur   . Hemorrhoid   . Complication of anesthesia   . Angina   . Dysrhythmia     paced  . Arthritis 09/18/11    "in my knees"    PHYSICAL EXAM Filed Vitals:   09/20/11 0400 09/20/11 0500 09/20/11 0730 09/20/11 0731  BP: 113/54   125/54  Pulse: 71   71  Temp: 98.1 F (36.7 C)  99.4 F (37.4 C)   TempSrc: Oral  Oral   Resp: 18   19  Height:      Weight:  160 lb 0.9 oz (72.6 kg)    SpO2: 95%   92%    Well developed and nourished in no acute distress HENT normal Neck supple with JVP-flat Carotids delayed before with transmitted murmur  Clear Regular rate and rhythm, 2/6 musical murmur with radiation into the neck  Abd-soft with active BS without hepatomegaly No Clubbing cyanosis edema Skin-warm and dry A & Oriented  Grossly normal sensory and motor function   TELEMETRY: Reviewed telemetry pt in nsr :    Intake/Output Summary (Last 24 hours) at 09/20/11 1040 Last data filed at 09/20/11 0800  Gross per 24 hour  Intake    480 ml  Output   1000 ml  Net   -520 ml    LABS: Basic Metabolic Panel:  Lab 09/20/11 7829 09/19/11 0135 09/18/11 1609  NA 138 139 139  K 4.1 4.1 4.3  CL 101 105 104  CO2 29 29 26   GLUCOSE 91 97 125*  BUN 19 20 23   CREATININE 1.34 1.17 1.30  CALCIUM 9.0 9.0 --  MG -- -- 1.9  PHOS -- -- --   Cardiac Enzymes:  Basename 09/18/11 1603  CKTOTAL 57  CKMB 2.3  CKMBINDEX --  TROPONINI <0.30     CBC:  Lab 09/20/11 0625 09/19/11 0135 09/18/11 1609  WBC 9.4 8.2 7.5  NEUTROABS -- -- 5.0  HGB 10.8* 8.8* 9.2*  HCT 33.0* 27.8* 28.5*  MCV 90.4 93.0 93.1  PLT 170 166 177   PROTIME:  Basename 09/18/11 1609  LABPROT 14.4  INR 1.10   Liver Function Tests:  Basename 09/18/11 1609  AST 22  ALT 16  ALKPHOS 55  BILITOT 0.2*  PROT 7.1  ALBUMIN 3.3*   No results found for this basename: LIPASE:2,AMYLASE:2 in the last 72 hours BNP: No components found with this basename: POCBNP:3 D-Dimer: No results found for this basename: DDIMER:2 in the last 72 hours Hemoglobin A1C:  Basename 09/18/11 1609  HGBA1C 5.5   Fasting Lipid Panel:  Basename 09/19/11 0135  CHOL 104  HDL 38*  LDLCALC 53  TRIG 66  CHOLHDL 2.7  LDLDIRECT --   Thyroid Function Tests:  Basename 09/18/11 1609  TSH 0.797  T4TOTAL --  T3FREE --  THYROIDAB --  ASSESSMENT AND PLAN: Patient with inoperable severe aortic stenosis who presents to the office with chest pain. He received nitroglycerin on the nose to that his aortic stenosis has severely worsened in the interim. There is loss of blood pressure and presyncope/syncope. He was admitted, and ruled out for myocardial infarction. He underwent catheterization demonstrating no significant major epicardial disease  He is feeling quite well. We'll plan to discharge him. I recommend that we set up an appointment to have him see Dr. Excell Seltzer to consider options regarding his valve.. It is worth asking primary cardiology as to why he remains on Plavix now almost 10 years status post stenting. His endoscopy showed AVMs.   Signed, Sherryl Manges MD  09/20/2011

## 2011-09-20 NOTE — Progress Notes (Signed)
Pt dc home with son via wc. Pt and son state understanding of dc instructions and followup.

## 2011-09-21 ENCOUNTER — Emergency Department (HOSPITAL_COMMUNITY)
Admission: EM | Admit: 2011-09-21 | Discharge: 2011-09-21 | Disposition: A | Discharge status: Home / Self Care | Payer: Medicare Other | Attending: Emergency Medicine | Admitting: Emergency Medicine

## 2011-09-21 ENCOUNTER — Encounter (HOSPITAL_COMMUNITY): Payer: Self-pay | Admitting: Emergency Medicine

## 2011-09-21 ENCOUNTER — Other Ambulatory Visit: Payer: Self-pay

## 2011-09-21 DIAGNOSIS — I10 Essential (primary) hypertension: Secondary | ICD-10-CM | POA: Insufficient documentation

## 2011-09-21 DIAGNOSIS — R011 Cardiac murmur, unspecified: Secondary | ICD-10-CM | POA: Insufficient documentation

## 2011-09-21 DIAGNOSIS — R9431 Abnormal electrocardiogram [ECG] [EKG]: Secondary | ICD-10-CM | POA: Insufficient documentation

## 2011-09-21 DIAGNOSIS — K5900 Constipation, unspecified: Secondary | ICD-10-CM | POA: Insufficient documentation

## 2011-09-21 DIAGNOSIS — Z950 Presence of cardiac pacemaker: Secondary | ICD-10-CM | POA: Insufficient documentation

## 2011-09-21 DIAGNOSIS — E785 Hyperlipidemia, unspecified: Secondary | ICD-10-CM | POA: Insufficient documentation

## 2011-09-21 DIAGNOSIS — R300 Dysuria: Secondary | ICD-10-CM | POA: Insufficient documentation

## 2011-09-21 DIAGNOSIS — K219 Gastro-esophageal reflux disease without esophagitis: Secondary | ICD-10-CM | POA: Insufficient documentation

## 2011-09-21 DIAGNOSIS — K649 Unspecified hemorrhoids: Secondary | ICD-10-CM | POA: Insufficient documentation

## 2011-09-21 DIAGNOSIS — F17200 Nicotine dependence, unspecified, uncomplicated: Secondary | ICD-10-CM | POA: Insufficient documentation

## 2011-09-21 DIAGNOSIS — I2510 Atherosclerotic heart disease of native coronary artery without angina pectoris: Secondary | ICD-10-CM | POA: Insufficient documentation

## 2011-09-21 DIAGNOSIS — I440 Atrioventricular block, first degree: Secondary | ICD-10-CM | POA: Insufficient documentation

## 2011-09-21 DIAGNOSIS — R339 Retention of urine, unspecified: Secondary | ICD-10-CM | POA: Insufficient documentation

## 2011-09-21 LAB — URINALYSIS, ROUTINE W REFLEX MICROSCOPIC
Glucose, UA: NEGATIVE mg/dL
Specific Gravity, Urine: 1.025 (ref 1.005–1.030)

## 2011-09-21 MED ORDER — MAGNESIUM CITRATE PO SOLN
1.0000 | Freq: Once | ORAL | Status: AC
  Administered 2011-09-21: 1 via ORAL
  Filled 2011-09-21: qty 296

## 2011-09-21 MED ORDER — HYDROCORTISONE ACETATE 25 MG RE SUPP: 25.0000 mg | Freq: Two times a day (BID) | RECTAL | Status: AC

## 2011-09-21 NOTE — ED Notes (Signed)
Pt undressed, in gown, on monitor, continuous pulse oximetry and blood pressure cuff; sheet provided; EKG performed

## 2011-09-21 NOTE — ED Notes (Signed)
Urine specimen collected and sent to lab for testing; 100cc sent

## 2011-09-21 NOTE — ED Notes (Signed)
MD at bedside. 

## 2011-09-21 NOTE — ED Notes (Signed)
Pt from home. C/o difficulty urinating and hard time having bowel movements. Pt discharge from here yesterday after having a heart catheretization.

## 2011-09-21 NOTE — ED Provider Notes (Signed)
History     CSN: [id number]  Arrival date & time 09/21/11  0840   First MD Initiated Contact with Patient 09/21/11 [id number]      Chief Complaint  Patient presents with  .  urinary retention         (Consider location/radiation/quality/duration/timing/severity/associated sxs/prior treatment) The history is provided by the patient.   78 year old male had a cardiac catheterization 2 days ago. Since then, he has not been able to have a bowel movement. He has not been able to urinate since yesterday morning. He at one prior episode of urinary retention about 25 years ago. He states he is not on any painkillers following the heart catheterization. Symptoms are severe. Nothing makes it better, nothing makes them worse. He has not taken anything at home for either problem. He denies any abdominal pain, nausea, vomiting, fever, chills, sweats  Past Medical History  Diagnosis Date  . CAD (coronary artery disease)     Remote stent to LAD in 1999. Last cath in 2004 and he is managed medically  . Anemia   . Hyperlipidemia   . Pacemaker     due to bradycardia; placed in 2004  . Aortic stenosis, severe     Per echo in 2012  . BPH (benign prostatic hyperplasia)   . HTN (hypertension)   . Colon polyps   . Basal cell carcinoma of skin   . Blood transfusion   . GERD (gastroesophageal reflux disease)   . Heart murmur   . Hemorrhoid   . Complication of anesthesia   . Dysrhythmia     paced  . Arthritis 09/18/11    "in my knees"  . Pacemaker     [employer]ONIC   DDD    Past Surgical History  Procedure Date  . Nasal hemorrhage control 1980's  . Insert / replace / remove pacemaker 2004    initial placement; [employer]  . Insert / replace / remove pacemaker 02/14/2011  . Cardiac catheterization 2004    Managed medically  . Coronary angioplasty with stent placement 05/09/1998    "1"; LAD  . Tonsillectomy     "when I was a teenager"  . Skin cancer excision 09/18/11    "have had a couple hundred of them  removed since I was in my 40's"  . Cardiac catheterization     Family History  Problem Relation Age of Onset  . Cancer Father     stomach  . Colon cancer Father   . Heart disease Brother   . Heart disease Brother     History  Substance Use Topics  . Smoking status: Former Smoker -- 1.0 packs/day for 42 years    Types: Cigarettes    Quit date: 09/15/1973  . Smokeless tobacco: Former Neurosurgeon    Types: Chew  . Alcohol Use: No      Review of Systems  Gastrointestinal: Positive for constipation.  Genitourinary: Positive for dysuria.  All other systems reviewed and are negative.    Allergies  Aspirin  Home Medications   Current Outpatient Rx  Name Route Sig Dispense Refill  . ACETAMINOPHEN 325 MG PO TABS Oral Take 650 mg by mouth every 6 (six) hours as needed. For fever    . AMLODIPINE BESYLATE 5 MG PO TABS Oral Take 1 tablet (5 mg total) by mouth daily. 30 tablet 4    Further refills by Dr. [name]  . ASPIRIN 81 MG PO TBEC Oral Take 1 tablet (81 mg total) by mouth daily.    Marland Kitchen  CLOPIDOGREL BISULFATE 75 MG PO TABS Oral Take 75 mg by mouth daily.      Marland Kitchen ESOMEPRAZOLE MAGNESIUM 40 MG PO CPDR Oral Take 40 mg by mouth daily before breakfast.      . FERROUS SULFATE 325 (65 FE) MG PO TABS Oral Take 325 mg by mouth daily with breakfast.      . ROSUVASTATIN CALCIUM 5 MG PO TABS Oral Take 5 mg by mouth at bedtime.        BP 122/59  Pulse 92  Temp(Src) 98 F (36.7 C) (Oral)  Resp 16  SpO2 94%  Physical Exam  Nursing note and vitals reviewed.  78 year old male who is resting comfortably and in no acute distress. Vital signs are normal. Oxygen saturation is 98% which is normal. Head is normocephalic and atraumatic. PERRLA, EOMI. Oropharynx is clear. Neck is supple without adenopathy or JVD. Lungs are clear without rales, wheezes, rhonchi. Heart has regular rate and rhythm with a 3/6 harsh systolic ejection type murmur heard at the cardiac base consistent with aortic stenosis.  Abdomen is soft with a distended bladder noted. There is no hepatosplenomegaly. Extremities have no cyanosis or edema. Skin is warm and moist without rash. Neurologic: Mental status is normal, cranial nerves are intact, there are no focal motor or sensory deficits. Psychiatric: No abnormalities of mood or affect.  ED Course  Procedures (including critical care time)   Labs Reviewed  URINALYSIS, ROUTINE W REFLEX MICROSCOPIC  URINE CULTURE   Results for orders placed during the hospital encounter of 09/21/11  URINALYSIS, ROUTINE W REFLEX MICROSCOPIC      Component Value Range   Color, Urine YELLOW  YELLOW    APPearance CLEAR  CLEAR    Specific Gravity, Urine 1.025  1.005 - 1.030    pH 5.5  5.0 - 8.0    Glucose, UA NEGATIVE  NEGATIVE (mg/dL)   Hgb urine dipstick MODERATE (*) NEGATIVE    Bilirubin Urine NEGATIVE  NEGATIVE    Ketones, ur NEGATIVE  NEGATIVE (mg/dL)   Protein, ur NEGATIVE  NEGATIVE (mg/dL)   Urobilinogen, UA 0.2  0.0 - 1.0 (mg/dL)   Nitrite NEGATIVE  NEGATIVE    Leukocytes, UA NEGATIVE  NEGATIVE   URINE MICROSCOPIC-ADD ON      Component Value Range   Squamous Epithelial / LPF RARE  RARE    WBC, UA 0-2  <3 (WBC/hpf)   RBC / HPF 21-50  <3 (RBC/hpf)   X-ray Chest Pa And Lateral  09/18/2011  *RADIOLOGY REPORT*  Clinical Data: Chest pain.  Symptoms since 10 o'clock this morning.  CHEST - 2 VIEW  Comparison: 02/14/2011  Findings: The patient has right-sided transvenous pacemaker.  Leads overlie the right atrium and right ventricle.  The heart is mildly enlarged.  The film is made with shallow inflation.  The there is subsegmental atelectasis at both lung bases, left greater than right.  No focal consolidations or pleural effusions.  No overt edema.  IMPRESSION:  1.  Cardiomegaly. 2.  Bibasilar atelectasis, left greater than right.  Original Report Authenticated By: Patterson Hammersmith, M.D.     No results found.  Date: 09/21/2011  Rate: 90  Rhythm: normal sinus rhythm  QRS  Axis: normal  Intervals: PR prolonged  ST/T Wave abnormalities: nonspecific ST/T changes  Conduction Disutrbances:first-degree A-V block   Narrative Interpretation: First degree AV block, nonspecific ST-T changes. When compared with ECG of 09/19/2010, no significant changes are seen.  Old EKG Reviewed: unchanged    No  diagnosis found.  Foley catheter was placed and he had about 1500 mL of urine drained from his bladder. Because of the large amount of urine, it is elected to keep his Foley catheter in place. He is to follow up with urology tomorrow. He is also complaining of hemorrhoids and will be given a prescription for Anusol HC suppositories and will be given a dose of magnesium citrate for his constipation.  Impressions: 1. Urinary retention 2. Constipation 3. Hemorrhoids  MDM  Urinary retention and constipation possibly related to medications given during cardiac catheterization. I reviewed his ED and hospital records from his recent hospitalization. He was admitted for chest pain which was felt to be due to his aortic stenosis and not to coronary disease, although he did have an 80% lesion of his first diagonal off of the LAD at the ostium.        Dione Booze, MD 09/21/11 1044

## 2011-09-22 ENCOUNTER — Encounter (HOSPITAL_COMMUNITY): Payer: Self-pay

## 2011-09-22 LAB — URINE CULTURE
Colony Count: NO GROWTH
Culture: NO GROWTH

## 2011-09-23 ENCOUNTER — Telehealth: Payer: Self-pay | Admitting: Internal Medicine

## 2011-09-23 ENCOUNTER — Telehealth: Payer: Self-pay | Admitting: Nurse Practitioner

## 2011-09-23 NOTE — Telephone Encounter (Signed)
New problem Pt's son wanted to talk to you about several issues he is having. Please call back at this number. He said he hasnt seen physician since seeing Dr Deborah Chalk.

## 2011-09-23 NOTE — Telephone Encounter (Signed)
[id number] Spoke with pt regarding several issues. As noted in the pts chart and provided also by the pt. He has recently been hospitalized and underwent a cardiac catheterization.  He recalls seeing Dr Graciela Husbands during his hospitalization at the time of discharge and at that time he was told to stop his Benicar and Cardura. The pt has since had some urinary retention that he was seen in the emergency room for and a foley cath was placed this past Sunday. The pt followed-up with Dr Margarita Grizzle regarding his urinary retention earlier today. He was given samples of Rapaflow 8mg  and asked by the urologist to clarify if this medication is OK for him to take per cardiology. The pt was also inquiring when he will be notified with his 2 wk follow-up with a cardiologist since he is a former Dr. Deborah Chalk pt. He was told to see Dr Excell Seltzer but his wife sees Dr Swaziland.  After speaking to Dr Graciela Husbands about cardura vs rapaflow he stated he would be glad to speak with Dr Margarita Grizzle. [facility] notified at [phone]. Spoke to [employer]. Dr Margarita Grizzle is at the hospital but will be back later this afternoon. Phone number provided so he may speak with Dr Graciela Husbands.  Dr Swaziland has agreed to see this pt and scheduling made aware that pt needs a two week follow-up. Melissa Hethcox to call pt with appt.   1350 The pt has been informed that Dr Graciela Husbands is waiting to speak with Dr Margarita Grizzle before determining which medication the pt should start cardura or rapaflow. Pt also aware that Dr Swaziland will see him and he will be notified by scheduling for this appt.

## 2011-09-23 NOTE — Telephone Encounter (Signed)
Per Dr Graciela Husbands. He spoke to Dr Margarita Grizzle regarding the cardura vs rapaflow. Dr Margarita Grizzle will call the pt and will advise the pt to start Rapaflow 4mg .

## 2011-09-24 ENCOUNTER — Telehealth: Payer: Self-pay | Admitting: Cardiology

## 2011-09-24 NOTE — Telephone Encounter (Signed)
Patient's son was called and told patient has appointment with Dr.Jordan 10/10/11 at 10:00 am.

## 2011-09-24 NOTE — Telephone Encounter (Signed)
New Problem  Patient son Akon Reinoso returning 09/23/11 call from Nurse Elnita Maxwell.

## 2011-09-27 ENCOUNTER — Other Ambulatory Visit: Payer: Self-pay | Admitting: Nurse Practitioner

## 2011-10-10 ENCOUNTER — Ambulatory Visit: Payer: Medicare Other | Admitting: Cardiology

## 2011-10-16 ENCOUNTER — Encounter: Payer: Self-pay | Admitting: *Deleted

## 2011-10-21 ENCOUNTER — Ambulatory Visit (INDEPENDENT_AMBULATORY_CARE_PROVIDER_SITE_OTHER): Payer: Medicare Other | Admitting: Cardiology

## 2011-10-21 ENCOUNTER — Encounter: Payer: Self-pay | Admitting: Cardiology

## 2011-10-21 VITALS — BP 142/80 | HR 60 | Ht 65.0 in | Wt 160.6 lb

## 2011-10-21 DIAGNOSIS — I498 Other specified cardiac arrhythmias: Secondary | ICD-10-CM

## 2011-10-21 DIAGNOSIS — I350 Nonrheumatic aortic (valve) stenosis: Secondary | ICD-10-CM

## 2011-10-21 DIAGNOSIS — I359 Nonrheumatic aortic valve disorder, unspecified: Secondary | ICD-10-CM

## 2011-10-21 DIAGNOSIS — I2510 Atherosclerotic heart disease of native coronary artery without angina pectoris: Secondary | ICD-10-CM

## 2011-10-21 DIAGNOSIS — R001 Bradycardia, unspecified: Secondary | ICD-10-CM

## 2011-10-21 NOTE — Patient Instructions (Signed)
We will schedule you for an echocardiogram.  I will see you again in 3 months.

## 2011-10-21 NOTE — Assessment & Plan Note (Signed)
He status post remote stenting of the LAD in 1999. Recent cardiac catheterization showed nonobstructive disease. We will continue medical management.

## 2011-10-21 NOTE — Assessment & Plan Note (Signed)
His last echocardiogram in June of 2010 showed moderate aortic stenosis. I have no documentation that this has gotten worse. We will repeat an echocardiogram. At his advanced age he would probably not be a good surgical candidate and we could consider TAVR but at this point I don't feel he is significantly symptomatic.

## 2011-10-21 NOTE — Progress Notes (Signed)
Jordan [name] Date of Birth: 05/24/1918   History of Present Illness: Jordan [name] is seen today for a followup visit. He was hospitalized in early January with chest pain and had a syncopal episode after receiving sublingual nitroglycerin. He underwent a cardiac catheterization with coronary angiography only which showed no significant obstruction. His Cardura and Benicar were held. 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 He does have a history of moderate aortic stenosis by echocardiogram with his last study in June of 2010. There is mention in his hospital records with him having severe aortic stenosis by echocardiogram in 2012. However, I reviewed all her data basis including E. chart, Epic, and his old paper chart and I see no evidence of an echocardiogram since 2010. Following his hospitalization he did develop a urinary retention. He is now on Rapaflo with improvement.  Current Outpatient Prescriptions on File Prior to Visit  Medication Sig Dispense Refill  . acetaminophen (TYLENOL) 325 MG tablet Take 650 mg by mouth every 6 (six) hours as needed. For fever      . amLODipine (NORVASC) 5 MG tablet Take 1 tablet (5 mg total) by mouth daily.  30 tablet  4  . clopidogrel (PLAVIX) 75 MG tablet Take 75 mg by mouth daily.        . CRESTOR 5 MG tablet TAKE 1 TABLET BY MOUTH EVERY DAY  30 tablet  6  . esomeprazole (NEXIUM) 40 MG capsule Take 40 mg by mouth daily before breakfast.        . ferrous sulfate 325 (65 FE) MG tablet Take 325 mg by mouth daily with breakfast.         Allergies  Allergen Reactions  . Aspirin     Stomach bleeds    Past Medical History  Diagnosis Date  . CAD (coronary artery disease)     Remote stent to LAD in 1999. Last cath in 2004 and he is managed medically  . Anemia   . Hyperlipidemia   . Pacemaker     due to bradycardia; placed in 2004  . Aortic stenosis   . BPH (benign prostatic hyperplasia)   .  HTN (hypertension)   . Colon polyps   . Blood transfusion   . GERD (gastroesophageal reflux disease)   . Hemorrhoid   . Complication of anesthesia   . Dysrhythmia     paced  . Arthritis 09/18/11    "in my knees"  . Basal cell carcinoma of skin     Past Surgical History  Procedure Date  . Nasal hemorrhage control 1980's  . Insert / replace / remove pacemaker 2004    initial placement; Medtronic  . Insert / replace / remove pacemaker 02/14/2011  . Cardiac catheterization 2004    Managed medically  . Coronary angioplasty with stent placement 05/09/1998    "1"; LAD  . Tonsillectomy     "when I was a teenager"  . Skin cancer excision 09/18/11    "have had a couple hundred of them removed since I was in my 40's"  . Cardiac catheterization     History  Smoking status  . Former Smoker -- 1.0 packs/day for 42 years  . Types: Cigarettes  . Quit date: 09/15/1973  Smokeless tobacco  . Former Neurosurgeon  . Types: Chew    History  Alcohol Use No    Family History  Problem Relation Age of Onset  . Cancer Father  stomach  . Colon cancer Father   . Heart disease Brother   . Heart disease Brother     Review of Systems: The review of systems is positive for mild fatigue.  He is followed in the pacer clinic. He denies any symptoms of dyspnea, chest pain, or dizziness currently. All other systems were reviewed and are negative.  Physical Exam: BP 142/80  Pulse 60  Ht 5\' 5"  (1.651 m)  Wt 160 lb 9.6 oz (72.848 kg)  BMI 26.73 kg/m2 Patient is very pleasant and in no acute distress. He looks younger than his stated age. Skin is warm and dry. Color is normal.  HEENT is unremarkable. Normocephalic/atraumatic. PERRL. Sclera are nonicteric. Neck is supple. No masses. No JVD. Lungs are clear. Cardiac exam shows a regular rate and rhythm. He has a harsh 3/6 outflow murmur. Abdomen is soft. Extremities are without edema. Gait and ROM are intact. No gross neurologic deficits noted.   LABORATORY  DATA:   Assessment / Plan:

## 2011-10-28 ENCOUNTER — Telehealth: Payer: Self-pay | Admitting: Cardiology

## 2011-10-28 ENCOUNTER — Ambulatory Visit (HOSPITAL_COMMUNITY): Payer: Medicare Other | Attending: Cardiology

## 2011-10-28 ENCOUNTER — Other Ambulatory Visit: Payer: Self-pay

## 2011-10-28 DIAGNOSIS — R001 Bradycardia, unspecified: Secondary | ICD-10-CM

## 2011-10-28 DIAGNOSIS — R55 Syncope and collapse: Secondary | ICD-10-CM | POA: Insufficient documentation

## 2011-10-28 DIAGNOSIS — I2510 Atherosclerotic heart disease of native coronary artery without angina pectoris: Secondary | ICD-10-CM

## 2011-10-28 DIAGNOSIS — I498 Other specified cardiac arrhythmias: Secondary | ICD-10-CM | POA: Insufficient documentation

## 2011-10-28 DIAGNOSIS — I350 Nonrheumatic aortic (valve) stenosis: Secondary | ICD-10-CM

## 2011-10-28 DIAGNOSIS — I359 Nonrheumatic aortic valve disorder, unspecified: Secondary | ICD-10-CM

## 2011-10-28 NOTE — Telephone Encounter (Signed)
Follow-up:     Patient's sone is calling to speak with you about what kind of test was performed on his father and if he could receive the results of that test. Please call back.

## 2011-10-28 NOTE — Telephone Encounter (Signed)
FU Call: pt returning call to our office. Please return pt call to discuss further.

## 2011-10-28 NOTE — Telephone Encounter (Signed)
Patient's son called and was told of patient's echo results.

## 2011-11-04 ENCOUNTER — Other Ambulatory Visit: Payer: Self-pay | Admitting: Cardiology

## 2011-11-04 MED ORDER — AMLODIPINE BESYLATE 5 MG PO TABS: 5.0000 mg | ORAL_TABLET | Freq: Every day | ORAL | Status: DC

## 2011-11-13 ENCOUNTER — Telehealth: Payer: Self-pay | Admitting: Cardiology

## 2011-11-13 NOTE — Telephone Encounter (Signed)
Pt calling to see if ok that he get a shingle shot?

## 2011-11-13 NOTE — Telephone Encounter (Signed)
He is ok to get the shingles vaccine Ocean Schildt [place] MD, [facility]

## 2011-11-13 NOTE — Telephone Encounter (Signed)
Pt was notified that Dr [place] is not in the [address] office today but message will be forwarded to him.

## 2011-11-14 NOTE — Telephone Encounter (Signed)
Patient called was told ok with Dr.Jordan to get shingles vaccine.

## 2011-11-19 ENCOUNTER — Other Ambulatory Visit: Payer: Self-pay | Admitting: Dermatology

## 2011-11-27 ENCOUNTER — Telehealth: Payer: Self-pay | Admitting: Cardiology

## 2011-11-28 ENCOUNTER — Other Ambulatory Visit: Payer: Self-pay

## 2011-11-28 ENCOUNTER — Other Ambulatory Visit: Payer: Self-pay | Admitting: Cardiology

## 2011-11-28 MED ORDER — ESOMEPRAZOLE MAGNESIUM 40 MG PO CPDR: 40.0000 mg | DELAYED_RELEASE_CAPSULE | Freq: Every day | ORAL | Status: DC

## 2011-11-28 NOTE — Telephone Encounter (Signed)
Refill - Nexium   Verified Pref[facility] as [facility] [address]

## 2011-12-20 ENCOUNTER — Other Ambulatory Visit: Payer: Self-pay | Admitting: Cardiology

## 2012-01-28 ENCOUNTER — Ambulatory Visit (INDEPENDENT_AMBULATORY_CARE_PROVIDER_SITE_OTHER): Payer: Medicare Other | Admitting: Cardiology

## 2012-01-28 ENCOUNTER — Encounter: Payer: Self-pay | Admitting: Cardiology

## 2012-01-28 VITALS — BP 147/83 | HR 86 | Ht 65.0 in | Wt 163.2 lb

## 2012-01-28 DIAGNOSIS — E785 Hyperlipidemia, unspecified: Secondary | ICD-10-CM

## 2012-01-28 DIAGNOSIS — I498 Other specified cardiac arrhythmias: Secondary | ICD-10-CM

## 2012-01-28 DIAGNOSIS — I350 Nonrheumatic aortic (valve) stenosis: Secondary | ICD-10-CM

## 2012-01-28 DIAGNOSIS — M25422 Effusion, left elbow: Secondary | ICD-10-CM

## 2012-01-28 DIAGNOSIS — I2510 Atherosclerotic heart disease of native coronary artery without angina pectoris: Secondary | ICD-10-CM

## 2012-01-28 DIAGNOSIS — R001 Bradycardia, unspecified: Secondary | ICD-10-CM

## 2012-01-28 DIAGNOSIS — I359 Nonrheumatic aortic valve disorder, unspecified: Secondary | ICD-10-CM

## 2012-01-28 NOTE — Patient Instructions (Signed)
Continue your current therapy.  Reduce your salt intake.  I will see you again in 6 months.

## 2012-01-28 NOTE — Assessment & Plan Note (Signed)
Related to prolonged leaning on his elbow yesterday. It is not inflamed. We'll monitor closely.

## 2012-01-28 NOTE — Progress Notes (Signed)
Jordan [name] Date of Birth: 1918-05-10   History of Present Illness: Jordan [name] is seen today for a followup visit. He was hospitalized in early January with chest pain and had a syncopal episode after receiving sublingual nitroglycerin. He underwent a cardiac catheterization with coronary angiography only which showed no significant obstruction.  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 He reports that he is doing very well from a cardiac standpoint. He's had no recurrent dizziness or syncope. He denies any chest pain or shortness of breath. He remains active. He does note swelling in his left elbow region today after leaning on his elbow yesterday to plant flowers.  Current Outpatient Prescriptions on File Prior to Visit  Medication Sig Dispense Refill  . acetaminophen (TYLENOL) 325 MG tablet Take 650 mg by mouth every 6 (six) hours as needed. For fever      . amLODipine (NORVASC) 5 MG tablet Take 1 tablet (5 mg total) by mouth daily.  30 tablet  5  . clopidogrel (PLAVIX) 75 MG tablet Take 75 mg by mouth daily.        . CRESTOR 5 MG tablet TAKE 1 TABLET BY MOUTH EVERY DAY  30 tablet  6  . ferrous sulfate 325 (65 FE) MG tablet Take 325 mg by mouth daily with breakfast.       . NEXIUM 40 MG capsule TAKE 1 CAPSULE BY MOUTH EVERY DAY  30 capsule  6  . silodosin (RAPAFLO) 4 MG CAPS capsule Take 4 mg by mouth daily with breakfast.        Allergies  Allergen Reactions  . Aspirin     Stomach bleeds    Past Medical History  Diagnosis Date  . CAD (coronary artery disease)     Remote stent to LAD in 1999. Last cath in 2004 and he is managed medically  . Anemia   . Hyperlipidemia   . Pacemaker     due to bradycardia; placed in 2004  . Aortic stenosis   . BPH (benign prostatic hyperplasia)   . HTN (hypertension)   . Colon polyps   . Blood transfusion   . GERD (gastroesophageal reflux disease)   . Hemorrhoid   . Complication of  anesthesia   . Dysrhythmia     paced  . Arthritis 09/18/11    "in my knees"  . Basal cell carcinoma of skin     Past Surgical History  Procedure Date  . Nasal hemorrhage control 1980's  . Insert / replace / remove pacemaker 2004    initial placement; Medtronic  . Insert / replace / remove pacemaker 02/14/2011  . Cardiac catheterization 2004    Managed medically  . Coronary angioplasty with stent placement 05/09/1998    "1"; LAD  . Tonsillectomy     "when I was a teenager"  . Skin cancer excision 09/18/11    "have had a couple hundred of them removed since I was in my 40's"  . Cardiac catheterization     History  Smoking status  . Former Smoker -- 1.0 packs/day for 42 years  . Types: Cigarettes  . Quit date: 09/15/1973  Smokeless tobacco  . Former Neurosurgeon  . Types: Chew    History  Alcohol Use No    Family History  Problem Relation Age of Onset  . Cancer Father     stomach  . Colon cancer Father   . Heart disease Brother   .  Heart disease Brother     Review of Systems: The review of systems is positive for mild fatigue.  He denies any symptoms of dyspnea, chest pain, or dizziness currently. All other systems were reviewed and are negative.  Physical Exam: BP 147/83  Pulse 86  Ht 5\' 5"  (1.651 m)  Wt 163 lb 3.2 oz (74.027 kg)  BMI 27.16 kg/m2 Patient is very pleasant and in no acute distress. He looks younger than his stated age. Skin is warm and dry. Color is normal.  HEENT is unremarkable. Normocephalic/atraumatic. PERRL. Sclera are nonicteric. Neck is supple. No masses. No JVD. Lungs are clear. Cardiac exam shows a regular rate and rhythm. He has a harsh 3/6 outflow murmur. Abdomen is soft. Extremities are without edema. The left olecranon bursa is full fluid. There is no tenderness or erythema. There is no increased warmth. Gait and ROM are intact. No gross neurologic deficits noted.   LABORATORY DATA: Echocardiogram was reviewed from February. This demonstrated  normal left ventricular function with moderate LVH. He has moderate aortic stenosis with a mean gradient of 31 mm of mercury. His last echocardiogram in 2010 showed a mean gradient of 20 mm of mercury.  Assessment / Plan:

## 2012-01-28 NOTE — Assessment & Plan Note (Signed)
He remains asymptomatic. We will continue with risk factor modification and medical management.

## 2012-01-28 NOTE — Assessment & Plan Note (Signed)
He has moderate aortic stenosis based on his echocardiogram. This has progressed some since 2010. At his advanced age and without having significant symptoms we will continue to monitor.

## 2012-03-09 ENCOUNTER — Encounter: Payer: Self-pay | Admitting: Internal Medicine

## 2012-03-09 ENCOUNTER — Ambulatory Visit (INDEPENDENT_AMBULATORY_CARE_PROVIDER_SITE_OTHER): Payer: Medicare Other | Admitting: Internal Medicine

## 2012-03-09 VITALS — BP 140/80 | HR 82 | Resp 18 | Ht 65.0 in | Wt 164.0 lb

## 2012-03-09 DIAGNOSIS — I2510 Atherosclerotic heart disease of native coronary artery without angina pectoris: Secondary | ICD-10-CM

## 2012-03-09 DIAGNOSIS — I441 Atrioventricular block, second degree: Secondary | ICD-10-CM | POA: Insufficient documentation

## 2012-03-09 DIAGNOSIS — I359 Nonrheumatic aortic valve disorder, unspecified: Secondary | ICD-10-CM

## 2012-03-09 DIAGNOSIS — Z950 Presence of cardiac pacemaker: Secondary | ICD-10-CM

## 2012-03-09 DIAGNOSIS — I350 Nonrheumatic aortic (valve) stenosis: Secondary | ICD-10-CM

## 2012-03-09 LAB — PACEMAKER DEVICE OBSERVATION
ATRIAL PACING PM: 53
BAMS-0001: 150 {beats}/min
BATTERY VOLTAGE: 2.78 V
VENTRICULAR PACING PM: 78

## 2012-03-09 NOTE — Assessment & Plan Note (Signed)
He denies anginal symptoms. No change in medical therapy. 

## 2012-03-09 NOTE — Progress Notes (Signed)
HPI Jordan [name] returns today for followup. He is a very pleasant 78 year old man with symptomatic bradycardia, status post permanent pacemaker insertion. The patient denies chest pain or shortness of breath. Despite his advanced age, he continues to do very well. No syncope. He is independent and cares for himself. Allergies  Allergen Reactions  . Aspirin     Stomach bleeds     Current Outpatient Prescriptions  Medication Sig Dispense Refill  . acetaminophen (TYLENOL) 325 MG tablet Take 650 mg by mouth every 6 (six) hours as needed. For fever      . amLODipine (NORVASC) 5 MG tablet Take 1 tablet (5 mg total) by mouth daily.  30 tablet  5  . clopidogrel (PLAVIX) 75 MG tablet Take 75 mg by mouth daily.        . CRESTOR 5 MG tablet TAKE 1 TABLET BY MOUTH EVERY DAY  30 tablet  6  . ferrous sulfate 325 (65 FE) MG tablet Take 325 mg by mouth daily with breakfast.       . NEXIUM 40 MG capsule TAKE 1 CAPSULE BY MOUTH EVERY DAY  30 capsule  6  . silodosin (RAPAFLO) 4 MG CAPS capsule Take 4 mg by mouth daily with breakfast.         Past Medical History  Diagnosis Date  . CAD (coronary artery disease)     Remote stent to LAD in 1999. Last cath in 2004 and he is managed medically  . Anemia   . Hyperlipidemia   . Pacemaker     due to bradycardia; placed in 2004  . Aortic stenosis   . BPH (benign prostatic hyperplasia)   . HTN (hypertension)   . Colon polyps   . Blood transfusion   . GERD (gastroesophageal reflux disease)   . Hemorrhoid   . Complication of anesthesia   . Dysrhythmia     paced  . Arthritis 09/18/11    "in my knees"  . Basal cell carcinoma of skin     ROS:   All systems reviewed and negative except as noted in the HPI.   Past Surgical History  Procedure Date  . Nasal hemorrhage control 1980's  . Insert / replace / remove pacemaker 2004    initial placement; Medtronic  . Insert / replace / remove pacemaker 02/14/2011  . Cardiac catheterization 2004    Managed  medically  . Coronary angioplasty with stent placement 05/09/1998    "1"; LAD  . Tonsillectomy     "when I was a teenager"  . Skin cancer excision 09/18/11    "have had a couple hundred of them removed since I was in my 40's"  . Cardiac catheterization      Family History  Problem Relation Age of Onset  . Cancer Father     stomach  . Colon cancer Father   . Heart disease Brother   . Heart disease Brother      History   Social History  . Marital Status: Married    Spouse Name: N/A    Number of Children: 2  . Years of Education: N/A   Occupational History  . Retired    Social History Main Topics  . Smoking status: Former Smoker -- 1.0 packs/day for 42 years    Types: Cigarettes    Quit date: 09/15/1973  . Smokeless tobacco: Former Neurosurgeon    Types: Chew  . Alcohol Use: No  . Drug Use: No  . Sexually Active: Yes   Other  Topics Concern  . Not on file   Social History Narrative  . No narrative on file     BP 140/80  Pulse 82  Resp 18  Ht 5\' 5"  (1.651 m)  Wt 164 lb (74.39 kg)  BMI 27.29 kg/m2  Physical Exam:  Well appearing 78 year old man, NAD HEENT: Unremarkable Neck:  No JVD, no thyromegally Lungs:  Clear with no wheezes, rales, or rhonchi. HEART:  Regular rate rhythm, grade 2/6 systolic murmur, consistent with aortic stenosis, no rubs, no clicks Abd:  soft, positive bowel sounds, no organomegally, no rebound, no guarding Ext:  2 plus pulses, no edema, no cyanosis, no clubbing Skin:  No rashes no nodules Neuro:  CN II through XII intact, motor grossly intact  DEVICE  Normal device function.  See PaceArt for details.   Assess/Plan:

## 2012-03-09 NOTE — Patient Instructions (Signed)
.  Your physician wants you to follow-up in: 12 months w/ Dr. Court Joy will receive a reminder letter in the mail two months in advance. If you don't receive a letter, please call our office to schedule the follow-up appointment.  .Remote monitoring is used to monitor your Pacemaker of ICD from home. This monitoring reduces the number of office visits required to check your device to one time per year. It allows [place] to keep an eye on the functioning of your device to ensure it is working properly. You are scheduled for a device check from home on 06/14/12. You may send your transmission at any time that day. If you have a wireless device, the transmission will be sent automatically. After your physician reviews your transmission, you will receive a postcard with your next transmission date.

## 2012-03-09 NOTE — Assessment & Plan Note (Signed)
His device is working normally. We'll plan to recheck in several months. 

## 2012-03-09 NOTE — Assessment & Plan Note (Signed)
He is asymptomatic. We'll plan to follow this cautiously and conservatively.

## 2012-04-24 ENCOUNTER — Other Ambulatory Visit: Payer: Self-pay | Admitting: Cardiology

## 2012-05-11 ENCOUNTER — Other Ambulatory Visit: Payer: Self-pay | Admitting: Cardiology

## 2012-05-11 MED ORDER — AMLODIPINE BESYLATE 5 MG PO TABS: 5.0000 mg | ORAL_TABLET | Freq: Every day | ORAL | Status: DC

## 2012-06-14 ENCOUNTER — Encounter: Payer: Medicare Other | Admitting: *Deleted

## 2012-06-18 ENCOUNTER — Encounter: Payer: Self-pay | Admitting: *Deleted

## 2012-06-26 ENCOUNTER — Encounter: Payer: Self-pay | Admitting: Internal Medicine

## 2012-06-29 ENCOUNTER — Ambulatory Visit (INDEPENDENT_AMBULATORY_CARE_PROVIDER_SITE_OTHER): Payer: Medicare Other | Admitting: *Deleted

## 2012-06-29 ENCOUNTER — Other Ambulatory Visit: Payer: Self-pay | Admitting: Dermatology

## 2012-06-29 DIAGNOSIS — Z950 Presence of cardiac pacemaker: Secondary | ICD-10-CM

## 2012-06-29 DIAGNOSIS — I441 Atrioventricular block, second degree: Secondary | ICD-10-CM

## 2012-07-02 LAB — REMOTE PACEMAKER DEVICE
AL AMPLITUDE: 2.8 mv
BAMS-0001: 150 {beats}/min
RV LEAD IMPEDENCE PM: 484 Ohm
VENTRICULAR PACING PM: 72

## 2012-07-07 ENCOUNTER — Other Ambulatory Visit: Payer: Self-pay

## 2012-07-15 ENCOUNTER — Encounter: Payer: Self-pay | Admitting: *Deleted

## 2012-07-17 ENCOUNTER — Other Ambulatory Visit: Payer: Self-pay | Admitting: Cardiology

## 2012-09-06 ENCOUNTER — Encounter: Payer: Self-pay | Admitting: Cardiology

## 2012-09-06 ENCOUNTER — Ambulatory Visit (INDEPENDENT_AMBULATORY_CARE_PROVIDER_SITE_OTHER): Payer: Medicare Other | Admitting: Cardiology

## 2012-09-06 VITALS — BP 147/83 | HR 93 | Ht 65.0 in | Wt 166.1 lb

## 2012-09-06 DIAGNOSIS — I2510 Atherosclerotic heart disease of native coronary artery without angina pectoris: Secondary | ICD-10-CM

## 2012-09-06 DIAGNOSIS — I359 Nonrheumatic aortic valve disorder, unspecified: Secondary | ICD-10-CM

## 2012-09-06 DIAGNOSIS — I350 Nonrheumatic aortic (valve) stenosis: Secondary | ICD-10-CM

## 2012-09-06 DIAGNOSIS — I441 Atrioventricular block, second degree: Secondary | ICD-10-CM

## 2012-09-06 DIAGNOSIS — E785 Hyperlipidemia, unspecified: Secondary | ICD-10-CM

## 2012-09-06 NOTE — Progress Notes (Signed)
Jordan [name] Date of Birth: 1917/12/09   History of Present Illness: Jordan [name] is seen today for a followup visit. He has a history of coronary disease with remote stenting of the LAD in 1999. Cardiac catheterization January 2013 showed nonobstructive disease. He has a history of bradycardia requiring pacemaker implant. He has a history of moderate aortic stenosis. On followup today he is feeling very well. He denies any chest pain or shortness of breath. He stays active playing golf and walking. He's had no dizziness or syncope.  Current Outpatient Prescriptions on File Prior to Visit  Medication Sig Dispense Refill  . acetaminophen (TYLENOL) 325 MG tablet Take 650 mg by mouth every 6 (six) hours as needed. For fever      . amLODipine (NORVASC) 5 MG tablet Take 1 tablet (5 mg total) by mouth daily.  30 tablet  5  . clopidogrel (PLAVIX) 75 MG tablet Take 75 mg by mouth daily.        . CRESTOR 5 MG tablet TAKE 1 TABLET BY MOUTH EVERY DAY  30 tablet  6  . ferrous sulfate 325 (65 FE) MG tablet Take 325 mg by mouth daily with breakfast.       . NEXIUM 40 MG capsule TAKE 1 CAPSULE BY MOUTH EVERY DAY  30 capsule  6  . silodosin (RAPAFLO) 4 MG CAPS capsule Take 4 mg by mouth daily with breakfast.        Allergies  Allergen Reactions  . Aspirin     Stomach bleeds    Past Medical History  Diagnosis Date  . CAD (coronary artery disease)     Remote stent to LAD in 1999. Last cath in 2004 and he is managed medically  . Anemia   . Hyperlipidemia   . Pacemaker     due to bradycardia; placed in 2004  . Aortic stenosis   . BPH (benign prostatic hyperplasia)   . HTN (hypertension)   . Colon polyps   . Blood transfusion   . GERD (gastroesophageal reflux disease)   . Hemorrhoid   . Complication of anesthesia   . Dysrhythmia     paced  . Arthritis 09/18/11    "in my knees"  . Basal cell carcinoma of skin     Past Surgical History  Procedure Date  . Nasal hemorrhage control 1980's   . Insert / replace / remove pacemaker 2004    initial placement; [employer]tronic  . Insert / replace / remove pacemaker 02/14/2011  . Cardiac catheterization 2004    Managed medically  . Coronary angioplasty with stent placement 05/09/1998    "1"; LAD  . Tonsillectomy     "when I was a teenager"  . Skin cancer excision 09/18/11    "have had a couple hundred of them removed since I was in my 40's"  . Cardiac catheterization     History  Smoking status  . Former Smoker -- 1.0 packs/day for 42 years  . Types: Cigarettes  . Quit date: 09/15/1973  Smokeless tobacco  . Former Neurosurgeon  . Types: Chew    History  Alcohol Use No    Family History  Problem Relation Age of Onset  . Cancer Father     stomach  . Colon cancer Father   . Heart disease Brother   . Heart disease Brother     Review of Systems: As noted in history of present illness. All other systems were reviewed and are negative.  Physical Exam: BP  147/83  Pulse 93  Ht 5\' 5"  (1.651 m)  Wt 166 lb 1.9 oz (75.352 kg)  BMI 27.64 kg/m2 Patient is very pleasant and in no acute distress. He looks younger than his stated age. Skin is warm and dry. Color is normal.  HEENT is unremarkable. Normocephalic/atraumatic. PERRL. Sclera are nonicteric. Neck is supple. No masses. No JVD. Lungs are clear. Cardiac exam shows a regular rate and rhythm. He has a harsh 3/6 outflow murmur.  Abdomen is soft. Extremities are without edema.  Gait and ROM are intact. No gross neurologic deficits noted.   LABORATORY DATA: ECG today demonstrates AV sequential pacing with a rate of 87 beats per minute.  Assessment / Plan: 1. Coronary disease with remote stenting of the LAD in 1999. Cardiac catheterization one year ago showed nonobstructive disease. He remains asymptomatic. We will continue with aspirin and statin therapy.  2. Moderate aortic stenosis. Last echocardiogram in February 2013. He remains asymptomatic.  3. Sinus node dysfunction with  bradycardia. Status post pacemaker implant. Pacemaker followup has been satisfactory.  4. Hyperlipidemia. On chronic Crestor therapy. Lab work followed by his primary.

## 2012-09-06 NOTE — Patient Instructions (Signed)
Continue your current therapy  I will see you again in 6 months.   

## 2012-10-04 ENCOUNTER — Encounter: Payer: Medicare Other | Admitting: *Deleted

## 2012-10-08 ENCOUNTER — Encounter: Payer: Self-pay | Admitting: *Deleted

## 2012-11-14 ENCOUNTER — Other Ambulatory Visit: Payer: Self-pay | Admitting: Internal Medicine

## 2012-11-14 ENCOUNTER — Encounter: Payer: Self-pay | Admitting: Internal Medicine

## 2012-11-15 ENCOUNTER — Ambulatory Visit (INDEPENDENT_AMBULATORY_CARE_PROVIDER_SITE_OTHER): Payer: Medicare Other | Admitting: *Deleted

## 2012-11-15 DIAGNOSIS — Z950 Presence of cardiac pacemaker: Secondary | ICD-10-CM

## 2012-11-15 DIAGNOSIS — I441 Atrioventricular block, second degree: Secondary | ICD-10-CM

## 2012-11-17 LAB — REMOTE PACEMAKER DEVICE
ATRIAL PACING PM: 33
BAMS-0001: 150 {beats}/min
RV LEAD IMPEDENCE PM: 461 Ohm
VENTRICULAR PACING PM: 77

## 2012-11-20 ENCOUNTER — Other Ambulatory Visit: Payer: Self-pay | Admitting: Cardiology

## 2012-11-24 ENCOUNTER — Encounter: Payer: Self-pay | Admitting: *Deleted

## 2013-02-08 ENCOUNTER — Encounter: Payer: Medicare Other | Admitting: Vascular Surgery

## 2013-02-09 ENCOUNTER — Encounter: Payer: Self-pay | Admitting: Emergency Medicine

## 2013-02-10 ENCOUNTER — Encounter: Payer: Self-pay | Admitting: Emergency Medicine

## 2013-02-10 ENCOUNTER — Ambulatory Visit (INDEPENDENT_AMBULATORY_CARE_PROVIDER_SITE_OTHER): Payer: Medicare Other | Admitting: Emergency Medicine

## 2013-02-10 VITALS — BP 150/78 | HR 80 | Temp 97.4°F | Ht 64.0 in | Wt 169.8 lb

## 2013-02-10 DIAGNOSIS — R911 Solitary pulmonary nodule: Secondary | ICD-10-CM

## 2013-02-10 DIAGNOSIS — J984 Other disorders of lung: Secondary | ICD-10-CM | POA: Insufficient documentation

## 2013-02-10 NOTE — Patient Instructions (Addendum)
We will defer a CT scan of the chest right now.  We will follow your right lower lobe nodule on your abdominal CT scans that are planned with Dr Margarita Grizzle.  If the lung nodule changes in size, then we will consider whether you would benefit from another procedure to look at it in more detail.  Follow with Dr Delton Coombes if the nodule changes in size or if you have any other issues

## 2013-02-10 NOTE — Assessment & Plan Note (Signed)
RLL nodule that has changed minimally in 5 years. This makes malignancy unlikely. We discussed the pro's and con's of getting a CT chest. I am not sure it will offer much here. Given his age and his other issues I do not think we will necessarily improve his QOL by identifying additional nodules that will need to be followed. I do not think he is a good bx candidate. At this point we agreed to do surveillance on th known nodule since he is going to get CT scan of the abdomen in 6 months anyway to follow his renal mass. i asked him to return if the nodule changes. If so we may decide to be more aggressive

## 2013-02-10 NOTE — Progress Notes (Signed)
Subjective:    Patient ID: Jordan [name], male    DOB: Jan 15, 1918, 79 y.o.   MRN: [id number]  HPI 79 yo former smoker (42-pk-yrs), following with Dr Margarita Grizzle for hematuria and newly discovered renal mass. After good discussion with Dr Margarita Grizzle, the pt and family decided to manage this conservatively. The workup has also revealed a RLL nodule. He denies dyspnea, has remained active. He is referred to discuss the options for following the nodule   Review of Systems  Constitutional: Negative for fever and unexpected weight change.  HENT: Negative for ear pain, nosebleeds, congestion, sore throat, rhinorrhea, sneezing, trouble swallowing, dental problem, postnasal drip and sinus pressure.   Eyes: Negative for redness and itching.  Respiratory: Negative for cough, chest tightness, shortness of breath and wheezing.   Cardiovascular: Negative for palpitations and leg swelling.  Gastrointestinal: Negative for nausea and vomiting.  Genitourinary: Negative for dysuria.  Musculoskeletal: Negative for joint swelling.  Skin: Negative for rash.  Neurological: Negative for headaches.  Hematological: Does not bruise/bleed easily.  Psychiatric/Behavioral: Negative for dysphoric mood. The patient is not nervous/anxious.    Past Medical History  Diagnosis Date  . CAD (coronary artery disease)     Remote stent to LAD in 1999. Last cath in 2004 and he is managed medically  . Anemia   . Hyperlipidemia   . Pacemaker     due to bradycardia; placed in 2004  . Aortic stenosis   . BPH (benign prostatic hyperplasia)   . HTN (hypertension)   . Colon polyps   . Blood transfusion   . GERD (gastroesophageal reflux disease)   . Hemorrhoid   . Complication of anesthesia   . Dysrhythmia     paced  . Arthritis 09/18/11    "in my knees"  . Basal cell carcinoma of skin      Family History  Problem Relation Age of Onset  . Cancer Father     stomach  . Colon cancer Father   . Heart disease Brother   .  Heart disease Brother   . Diabetes Other     granddaughter     History   Social History  . Marital Status: Married    Spouse Name: N/A    Number of Children: 2  . Years of Education: N/A   Occupational History  . Retired     Psychologist, occupational   Social History Main Topics  . Smoking status: Former Smoker -- 1.00 packs/day for 42 years    Types: Cigarettes    Quit date: 09/15/1973  . Smokeless tobacco: Former Neurosurgeon    Types: Chew  . Alcohol Use: No  . Drug Use: No  . Sexually Active: Yes   Other Topics Concern  . Not on file   Social History Narrative  . No narrative on file   worked as a Psychologist, occupational for many yrs  Allergies  Allergen Reactions  . Aspirin     Stomach bleeds     Outpatient Prescriptions Prior to Visit  Medication Sig Dispense Refill  . acetaminophen (TYLENOL) 325 MG tablet Take 650 mg by mouth every 6 (six) hours as needed. For fever      . amLODipine (NORVASC) 5 MG tablet Take 1 tablet (5 mg total) by mouth daily.  30 tablet  5  . clopidogrel (PLAVIX) 75 MG tablet Take 75 mg by mouth daily.        . CRESTOR 5 MG tablet TAKE 1 TABLET BY MOUTH EVERY DAY  30 tablet  6  . ferrous sulfate 325 (65 FE) MG tablet Take 325 mg by mouth daily with breakfast.       . NEXIUM 40 MG capsule TAKE 1 CAPSULE BY MOUTH EVERY DAY  30 capsule  6  . finasteride (PROSCAR) 5 MG tablet Take 5 mg by mouth daily.      . tamsulosin (FLOMAX) 0.4 MG CAPS Take 0.4 mg by mouth daily.       No facility-administered medications prior to visit.         Objective:   Physical Exam Filed Vitals:   02/10/13 1538  BP: 150/78  Pulse: 80  Temp: 97.4 F (36.3 C)  TempSrc: Oral  Height: 5\' 4"  (1.626 m)  Weight: 169 lb 12.8 oz (77.021 kg)  SpO2: 96%   Gen: Pleasant, well-nourished, in no distress,  normal affect  ENT: No lesions,  mouth clear,  oropharynx clear, no postnasal drip  Neck: No JVD, no TMG, no carotid bruits  Lungs: No use of accessory muscles, no dullness to percussion, clear  without rales or rhonchi  Cardiovascular: RRR, heart sounds normal, no murmur or gallops, no peripheral edema  Musculoskeletal: No deformities, no cyanosis or clubbing  Neuro: alert, non focal  Skin: Warm, no lesions or rashes      Assessment & Plan:  Pulmonary nodule, right RLL nodule that has changed minimally in 5 years. This makes malignancy unlikely. We discussed the pro's and con's of getting a CT chest. I am not sure it will offer much here. Given his age and his other issues I do not think we will necessarily improve his QOL by identifying additional nodules that will need to be followed. I do not think he is a good bx candidate. At this point we agreed to do surveillance on th known nodule since he is going to get CT scan of the abdomen in 6 months anyway to follow his renal mass. i asked him to return if the nodule changes. If so we may decide to be more aggressive

## 2013-02-11 ENCOUNTER — Other Ambulatory Visit: Payer: Self-pay | Admitting: Emergency Medicine

## 2013-02-11 MED ORDER — AMLODIPINE BESYLATE 5 MG PO TABS: 5.0000 mg | ORAL_TABLET | Freq: Every day | ORAL | Status: DC

## 2013-02-12 ENCOUNTER — Other Ambulatory Visit: Payer: Self-pay | Admitting: Cardiology

## 2013-02-21 ENCOUNTER — Encounter: Payer: Medicare Other | Admitting: Vascular Surgery

## 2013-02-23 ENCOUNTER — Encounter: Payer: Self-pay | Admitting: Vascular Surgery

## 2013-02-24 ENCOUNTER — Ambulatory Visit (INDEPENDENT_AMBULATORY_CARE_PROVIDER_SITE_OTHER): Payer: Medicare Other | Admitting: Vascular Surgery

## 2013-02-24 ENCOUNTER — Encounter: Payer: Self-pay | Admitting: Vascular Surgery

## 2013-02-24 ENCOUNTER — Other Ambulatory Visit: Payer: Self-pay | Admitting: Internal Medicine

## 2013-02-24 VITALS — BP 178/75 | HR 89 | Resp 16 | Ht 64.0 in | Wt 169.0 lb

## 2013-02-24 DIAGNOSIS — I714 Abdominal aortic aneurysm, without rupture, unspecified: Secondary | ICD-10-CM | POA: Insufficient documentation

## 2013-02-24 NOTE — Progress Notes (Signed)
Subjective:     Patient ID: Jordan [name], male   DOB: 28-Jan-1918, 79 y.o.   MRN: [id number]  HPI this 79 year old male was referred for evaluation of abdominal aortic aneurysm. Patient had a known aneurysm in 2000 and and by CT scan. He had a recent CT scan which revealed a 4.2 cm infrarenal aortic aneurysm. He denies any abdominal flank or back symptoms. He was also found to have a small lesion in the lower pole of his left kidney which could represent a renal cell carcinoma which is being followed by Dr. Margarita Grizzle.  Past Medical History  Diagnosis Date  . CAD (coronary artery disease)     Remote stent to LAD in 1999. Last cath in 2004 and he is managed medically  . Anemia   . Hyperlipidemia   . Pacemaker     due to bradycardia; placed in 2004  . Aortic stenosis   . BPH (benign prostatic hyperplasia)   . HTN (hypertension)   . Colon polyps   . Blood transfusion   . GERD (gastroesophageal reflux disease)   . Hemorrhoid   . Complication of anesthesia   . Dysrhythmia     paced  . Arthritis 09/18/11    "in my knees"  . Basal cell carcinoma of skin     History  Substance Use Topics  . Smoking status: Former Smoker -- 1.00 packs/day for 42 years    Types: Cigarettes    Quit date: 09/15/1973  . Smokeless tobacco: Former Neurosurgeon    Types: Chew  . Alcohol Use: No    Family History  Problem Relation Age of Onset  . Cancer Father     stomach  . Colon cancer Father   . Heart disease Brother   . Heart disease Brother   . Diabetes Other     granddaughter    Allergies  Allergen Reactions  . Aspirin     Stomach bleeds    Current outpatient prescriptions:acetaminophen (TYLENOL) 325 MG tablet, Take 650 mg by mouth every 6 (six) hours as needed. For fever, Disp: , Rfl: ;  amLODipine (NORVASC) 5 MG tablet, Take 1 tablet (5 mg total) by mouth daily., Disp: 30 tablet, Rfl: 5;  clopidogrel (PLAVIX) 75 MG tablet, Take 75 mg by mouth daily.  , Disp: , Rfl: ;  CRESTOR 5 MG tablet, TAKE 1  TABLET BY MOUTH EVERY DAY, Disp: 30 tablet, Rfl: 6 ferrous sulfate 325 (65 FE) MG tablet, Take 325 mg by mouth daily with breakfast. , Disp: , Rfl: ;  IRON PO, Take 65 mg by mouth daily., Disp: , Rfl: ;  NEXIUM 40 MG capsule, TAKE 1 CAPSULE BY MOUTH EVERY DAY, Disp: 30 capsule, Rfl: 0;  silodosin (RAPAFLO) 4 MG CAPS capsule, Take 8 mg by mouth daily with breakfast., Disp: , Rfl:   BP 178/75  Pulse 89  Resp 16  Ht 5\' 4"  (1.626 m)  Wt 169 lb (76.658 kg)  BMI 28.99 kg/m2  Body mass index is 28.99 kg/(m^2).          Review of Systems pannus pacemaker placed by Dr. Sharrell Ku, has history of cardiac stent and is followed by Dr. Peter [place], denies chest pain, dyspnea on exertion, PND, orthopnea, claudication. Plays golf H. week. Does have history of hematuria.    Objective:   Physical Exam blood pressure 170/75 heart rate 89 respirations 16 Gen.-alert and oriented x3 in no apparent distress HEENT normal for age Lungs no rhonchi or wheezing Cardiovascular regular  rhythm no murmurs carotid pulses 3+ palpable -soft bruit on the right  Abdomen soft nontender no palpable masses Musculoskeletal free of  major deformities Skin clear -no rashes Neurologic normal Lower extremities 3+ femoral and dorsalis pedis pulses palpable bilaterally with no edema  Today I reviewed the CT scan computer desk. He does appear to be maximally dilated at 4.2 cm in a very focal fashion in the mid infrarenal aorta. It does appear to be a stent graft candidate if it became necessary in the future.      Assessment:     4.2 cm infrarenal abdominal aortic aneurysm with very little enlargement over past 5 years by CT scan-appears to be stent graft candidate    Plan:     Patient is to be seen by Dr. Margarita Grizzle in 6 months with CT scan of abdomen to follow kidney lesion I will see the patient shortly thereafter to see if there has been any change in size of aneurysm and we will then follow him on an annual  basis.

## 2013-02-25 ENCOUNTER — Encounter: Payer: Self-pay | Admitting: Urology

## 2013-03-02 ENCOUNTER — Ambulatory Visit (INDEPENDENT_AMBULATORY_CARE_PROVIDER_SITE_OTHER): Payer: Medicare Other | Admitting: Cardiology

## 2013-03-02 ENCOUNTER — Encounter: Payer: Self-pay | Admitting: Cardiology

## 2013-03-02 VITALS — BP 128/74 | HR 85 | Ht 64.0 in | Wt 166.0 lb

## 2013-03-02 DIAGNOSIS — I2510 Atherosclerotic heart disease of native coronary artery without angina pectoris: Secondary | ICD-10-CM

## 2013-03-02 DIAGNOSIS — I441 Atrioventricular block, second degree: Secondary | ICD-10-CM

## 2013-03-02 DIAGNOSIS — I350 Nonrheumatic aortic (valve) stenosis: Secondary | ICD-10-CM

## 2013-03-02 DIAGNOSIS — I359 Nonrheumatic aortic valve disorder, unspecified: Secondary | ICD-10-CM

## 2013-03-02 DIAGNOSIS — Z950 Presence of cardiac pacemaker: Secondary | ICD-10-CM

## 2013-03-02 DIAGNOSIS — E785 Hyperlipidemia, unspecified: Secondary | ICD-10-CM

## 2013-03-02 DIAGNOSIS — I714 Abdominal aortic aneurysm, without rupture: Secondary | ICD-10-CM

## 2013-03-02 NOTE — Patient Instructions (Signed)
Continue your current therapy  I will get a copy of your last lab work from Dr. Eloise Harman  I will see you in 6 months.

## 2013-03-05 NOTE — Progress Notes (Signed)
Jordan [name] Date of Birth: 07/31/1918   History of Present Illness: Jordan [name] is seen today for a followup visit. He is now 79 years old. He has a history of coronary disease with remote stenting of the LAD in 1999. Cardiac catheterization January 2013 showed nonobstructive disease. He has a history of bradycardia requiring pacemaker implant. He has a history of moderate aortic stenosis. On followup today he is feeling very well. He denies any chest pain or shortness of breath. He remains active and plays golf at least once a week. He recently had a CT of the abdomen for evaluation of hematuria. He was found to have a left renal lesion as well as a right lower lobe lung nodule. He had a 4.2 cm abdominal aortic aneurysm. He has been seen by Dr. Hart Rochester for evaluation of his aneurysm, Dr. Margarita Grizzle for evaluation of his renal lesion, and Dr. Delton Coombes for evaluation of his lung nodule. All have recommended watchful waiting with repeat CT in 6 months.  Current Outpatient Prescriptions on File Prior to Visit  Medication Sig Dispense Refill  . acetaminophen (TYLENOL) 325 MG tablet Take 650 mg by mouth every 6 (six) hours as needed. For fever      . amLODipine (NORVASC) 5 MG tablet Take 1 tablet (5 mg total) by mouth daily.  30 tablet  5  . clopidogrel (PLAVIX) 75 MG tablet Take 75 mg by mouth daily.        . CRESTOR 5 MG tablet TAKE 1 TABLET BY MOUTH EVERY DAY  30 tablet  6  . ferrous sulfate 325 (65 FE) MG tablet Take 325 mg by mouth daily with breakfast.       . NEXIUM 40 MG capsule TAKE 1 CAPSULE BY MOUTH EVERY DAY  30 capsule  0  . silodosin (RAPAFLO) 4 MG CAPS capsule Take 8 mg by mouth daily with breakfast.       No current facility-administered medications on file prior to visit.    Allergies  Allergen Reactions  . Aspirin     Stomach bleeds    Past Medical History  Diagnosis Date  . CAD (coronary artery disease)     Remote stent to LAD in 1999. Last cath in 2004 and he is managed  medically  . Anemia   . Hyperlipidemia   . Pacemaker     due to bradycardia; placed in 2004  . Aortic stenosis   . BPH (benign prostatic hyperplasia)   . HTN (hypertension)   . Colon polyps   . Blood transfusion   . GERD (gastroesophageal reflux disease)   . Hemorrhoid   . Complication of anesthesia   . Dysrhythmia     paced  . Arthritis 09/18/11    "in my knees"  . Basal cell carcinoma of skin   . AAA (abdominal aortic aneurysm)   . Lung nodule     RLL  . Renal mass, left     Past Surgical History  Procedure Laterality Date  . Nasal hemorrhage control  1980's  . Insert / replace / remove pacemaker  2004    initial placement; [employer]  . Insert / replace / remove pacemaker  02/14/2011  . Cardiac catheterization  2004    Managed medically  . Coronary angioplasty with stent placement  05/09/1998    "1"; LAD  . Tonsillectomy      "when I was a teenager"  . Skin cancer excision  09/18/11    "have had a couple  hundred of them removed since I was in my 40's"  . Cardiac catheterization      History  Smoking status  . Former Smoker -- 1.00 packs/day for 42 years  . Types: Cigarettes  . Quit date: 09/15/1973  Smokeless tobacco  . Former Neurosurgeon  . Types: Chew    History  Alcohol Use No    Family History  Problem Relation Age of Onset  . Cancer Father     stomach  . Colon cancer Father   . Heart disease Brother   . Heart disease Brother   . Diabetes Other     granddaughter    Review of Systems: As noted in history of present illness. All other systems were reviewed and are negative.  Physical Exam: BP 128/74  Pulse 85  Ht 5\' 4"  (1.626 m)  Wt 166 lb (75.297 kg)  BMI 28.48 kg/m2  SpO2 96% Patient is very pleasant and in no acute distress. He looks younger than his stated age. Skin is warm and dry. Color is normal.  HEENT is unremarkable. Normocephalic/atraumatic. PERRL. Sclera are nonicteric. Neck is supple. No masses. No JVD. Lungs are clear. Cardiac exam shows  a regular rate and rhythm. He has a harsh 3/6 outflow murmur.  Abdomen is soft. Extremities are without edema.  Gait and ROM are intact. No gross neurologic deficits noted.   LABORATORY DATA: Lab Results  Component Value Date   WBC 9.4 09/20/2011   HGB 10.8* 09/20/2011   HCT 33.0* 09/20/2011   PLT 170 09/20/2011   GLUCOSE 91 09/20/2011   CHOL 104 09/19/2011   TRIG 66 09/19/2011   HDL 38* 09/19/2011   LDLCALC 53 09/19/2011   ALT 16 09/18/2011   AST 22 09/18/2011   NA 138 09/20/2011   K 4.1 09/20/2011   CL 101 09/20/2011   CREATININE 1.34 09/20/2011   BUN 19 09/20/2011   CO2 29 09/20/2011   TSH 0.797 09/18/2011   INR 1.10 09/18/2011   HGBA1C 5.5 09/18/2011     Assessment / Plan: 1. Coronary disease with remote stenting of the LAD in 1999. Cardiac catheterization Jan 2013 showed nonobstructive disease. He remains asymptomatic. We will continue with aspirin and statin therapy.  2. Moderate aortic stenosis. Last echocardiogram in February 2013. He remains asymptomatic.  3. Sinus node dysfunction with bradycardia. Status post pacemaker implant. Pacemaker followup has been satisfactory.  4. Hyperlipidemia. On chronic Crestor therapy. Lab work followed by his primary.   5. AAA 4.2 cm. Repeat CT 6 months.  6. RLL pulmonary nodule. Repeat CT 6 months.  7. Left renal lesion. Repeat CT 6 months.

## 2013-03-09 ENCOUNTER — Encounter: Payer: Self-pay | Admitting: Internal Medicine

## 2013-03-09 ENCOUNTER — Ambulatory Visit (INDEPENDENT_AMBULATORY_CARE_PROVIDER_SITE_OTHER): Payer: Medicare Other | Admitting: Internal Medicine

## 2013-03-09 VITALS — BP 137/72 | HR 91 | Ht 64.0 in | Wt 166.4 lb

## 2013-03-09 DIAGNOSIS — Z950 Presence of cardiac pacemaker: Secondary | ICD-10-CM

## 2013-03-09 DIAGNOSIS — I2510 Atherosclerotic heart disease of native coronary artery without angina pectoris: Secondary | ICD-10-CM

## 2013-03-09 DIAGNOSIS — I441 Atrioventricular block, second degree: Secondary | ICD-10-CM

## 2013-03-09 NOTE — Patient Instructions (Addendum)
Remote monitoring is used to monitor your Pacemaker of ICD from home. This monitoring reduces the number of office visits required to check your device to one time per year. It allows us to keep an eye on the functioning of your device to ensure it is working properly. You are scheduled for a device check from home on June 06, 2013. You may send your transmission at any time that day. If you have a wireless device, the transmission will be sent automatically. After your physician reviews your transmission, you will receive a postcard with your next transmission date.  Your physician wants you to follow-up in: 1 year with Brooke Edmisten, PA.  You will receive a reminder letter in the mail two months in advance. If you don't receive a letter, please call our office to schedule the follow-up appointment.  

## 2013-03-12 ENCOUNTER — Other Ambulatory Visit: Payer: Self-pay | Admitting: Cardiology

## 2013-03-18 ENCOUNTER — Encounter: Payer: Self-pay | Admitting: Internal Medicine

## 2013-03-18 NOTE — Assessment & Plan Note (Signed)
His DDD PPM is working normally. Will recheck in several months.

## 2013-03-18 NOTE — Progress Notes (Signed)
HPI [name] returns today for followup. He is a pleasant 79 yo man with a h/o symptomatic bradycardia, s/p PPM insertion, HTN, CAD, and dyslipidemia. In the interim, he has been stable. He denies chest pain. Minimal peripheral edema and sob. No syncope.  Allergies  Allergen Reactions  . Aspirin     Stomach bleeds     Current Outpatient Prescriptions  Medication Sig Dispense Refill  . acetaminophen (TYLENOL) 325 MG tablet Take 650 mg by mouth every 6 (six) hours as needed. For fever      . amLODipine (NORVASC) 5 MG tablet Take 1 tablet (5 mg total) by mouth daily.  30 tablet  5  . clopidogrel (PLAVIX) 75 MG tablet Take 75 mg by mouth daily.        . CRESTOR 5 MG tablet TAKE 1 TABLET BY MOUTH EVERY DAY  30 tablet  6  . ferrous sulfate 325 (65 FE) MG tablet Take 325 mg by mouth daily with breakfast.       . silodosin (RAPAFLO) 4 MG CAPS capsule Take 8 mg by mouth daily with breakfast.      . NEXIUM 40 MG capsule TAKE 1 CAPSULE BY MOUTH EVERY DAY  30 capsule  1   No current facility-administered medications for this visit.     Past Medical History  Diagnosis Date  . CAD (coronary artery disease)     Remote stent to LAD in 1999. Last cath in 2004 and he is managed medically  . Anemia   . Hyperlipidemia   . Pacemaker     due to bradycardia; placed in 2004  . Aortic stenosis   . BPH (benign prostatic hyperplasia)   . HTN (hypertension)   . Colon polyps   . Blood transfusion   . GERD (gastroesophageal reflux disease)   . Hemorrhoid   . Complication of anesthesia   . Dysrhythmia     paced  . Arthritis 09/18/11    "in my knees"  . Basal cell carcinoma of skin   . AAA (abdominal aortic aneurysm)   . Lung nodule     RLL  . Renal mass, left     ROS:   All systems reviewed and negative except as noted in the HPI.   Past Surgical History  Procedure Laterality Date  . Nasal hemorrhage control  1980's  . Insert / replace / remove pacemaker  2004    initial placement;  [employer]  . Insert / replace / remove pacemaker  02/14/2011  . Cardiac catheterization  2004    Managed medically  . Coronary angioplasty with stent placement  05/09/1998    "1"; LAD  . Tonsillectomy      "when I was a teenager"  . Skin cancer excision  09/18/11    "have had a couple hundred of them removed since I was in my 40's"  . Cardiac catheterization       Family History  Problem Relation Age of Onset  . Cancer Father     stomach  . Colon cancer Father   . Heart disease Brother   . Heart disease Brother   . Diabetes Other     granddaughter     History   Social History  . Marital Status: Married    Spouse Name: N/A    Number of Children: 2  . Years of Education: N/A   Occupational History  . Retired     Psychologist, occupational   Social History Main Topics  . Smoking status:  Former Smoker -- 1.00 packs/day for 42 years    Types: Cigarettes    Quit date: 09/15/1973  . Smokeless tobacco: Former Neurosurgeon    Types: Chew  . Alcohol Use: No  . Drug Use: No  . Sexually Active: Yes   Other Topics Concern  . Not on file   Social History Narrative  . No narrative on file     BP 137/72  Pulse 91  Ht 5\' 4"  (1.626 m)  Wt 166 lb 6.4 oz (75.479 kg)  BMI 28.55 kg/m2  Physical Exam:  stable appearing 79 yo man, NAD HEENT: Unremarkable Neck:  No JVD, no thyromegally Lungs:  Clear with no wheezes, rales, or rhonchi HEART:  Regular rate rhythm, no murmurs, no rubs, no clicks Abd:  soft, positive bowel sounds, no organomegally, no rebound, no guarding Ext:  2 plus pulses, no edema, no cyanosis, no clubbing Skin:  No rashes no nodules Neuro:  CN II through XII intact, motor grossly intact   DEVICE  Normal device function.  See PaceArt for details.   Assess/Plan:

## 2013-03-18 NOTE — Assessment & Plan Note (Signed)
He denies anginal symptoms. Will continue current meds. 

## 2013-03-26 LAB — PACEMAKER DEVICE OBSERVATION
AL THRESHOLD: 0.75 V
ATRIAL PACING PM: 36.4
RV LEAD IMPEDENCE PM: 476 Ohm
RV LEAD THRESHOLD: 0.75 V

## 2013-04-05 ENCOUNTER — Telehealth: Payer: Self-pay | Admitting: Cardiology

## 2013-04-05 NOTE — Telephone Encounter (Signed)
Patient called no answer.Unable to leave a message voice mail box not set up.Patient's [name] called was told returned a call to his father and no answer unable to leave a message.Son stated he would try to get in touch with his father and call me back.

## 2013-04-05 NOTE — Telephone Encounter (Signed)
[name] returned call he stated he spoke to his father and he was stung by a couple of wasp this morning while trimming bushes.Stated his father is going to see PCP this afternoon.

## 2013-04-05 NOTE — Telephone Encounter (Signed)
New Prob  Pt wants to speak with you. He would not say what it was about but he said its important.

## 2013-05-14 ENCOUNTER — Other Ambulatory Visit: Payer: Self-pay | Admitting: Cardiology

## 2013-06-05 ENCOUNTER — Other Ambulatory Visit: Payer: Self-pay | Admitting: Cardiology

## 2013-06-06 ENCOUNTER — Ambulatory Visit (INDEPENDENT_AMBULATORY_CARE_PROVIDER_SITE_OTHER): Payer: Medicare Other | Admitting: *Deleted

## 2013-06-06 DIAGNOSIS — I441 Atrioventricular block, second degree: Secondary | ICD-10-CM

## 2013-06-06 DIAGNOSIS — Z950 Presence of cardiac pacemaker: Secondary | ICD-10-CM

## 2013-06-11 LAB — REMOTE PACEMAKER DEVICE
AL AMPLITUDE: 2.8 mv
AL IMPEDENCE PM: 459 Ohm
AL THRESHOLD: 0.75 V
ATRIAL PACING PM: 37
BAMS-0001: 150 {beats}/min
RV LEAD IMPEDENCE PM: 467 Ohm
RV LEAD THRESHOLD: 0.75 V

## 2013-06-14 ENCOUNTER — Encounter: Payer: Self-pay | Admitting: *Deleted

## 2013-06-21 ENCOUNTER — Encounter: Payer: Self-pay | Admitting: Internal Medicine

## 2013-07-25 ENCOUNTER — Encounter: Payer: Self-pay | Admitting: Vascular Surgery

## 2013-07-26 ENCOUNTER — Ambulatory Visit (INDEPENDENT_AMBULATORY_CARE_PROVIDER_SITE_OTHER): Payer: Medicare Other | Admitting: Vascular Surgery

## 2013-07-26 ENCOUNTER — Encounter (INDEPENDENT_AMBULATORY_CARE_PROVIDER_SITE_OTHER): Payer: Self-pay

## 2013-07-26 ENCOUNTER — Encounter: Payer: Self-pay | Admitting: Vascular Surgery

## 2013-07-26 VITALS — BP 154/73 | HR 83 | Ht 64.0 in | Wt 167.2 lb

## 2013-07-26 DIAGNOSIS — I714 Abdominal aortic aneurysm, without rupture: Secondary | ICD-10-CM

## 2013-07-26 NOTE — Progress Notes (Signed)
Subjective:     Patient ID: Jordan [name], male   DOB: 1917/10/26, 79 y.o.   MRN: [id number]  HPI this 79 year old male returns for continued followup regarding his abdominal aortic aneurysm. I first saw him in June of 2014 at which time the aneurysm was 4.2 cm by CT scan performed by Dr. Hilario Quarry office. Patient has had no, or back symptoms since that time. He is quite active plays golf once a week and frequently shoots his age. He has had no major health issues since I last saw him.  Past Medical History  Diagnosis Date  . CAD (coronary artery disease)     Remote stent to LAD in 1999. Last cath in 2004 and he is managed medically  . Anemia   . Hyperlipidemia   . Pacemaker     due to bradycardia; placed in 2004  . Aortic stenosis   . BPH (benign prostatic hyperplasia)   . HTN (hypertension)   . Colon polyps   . Blood transfusion   . GERD (gastroesophageal reflux disease)   . Hemorrhoid   . Complication of anesthesia   . Dysrhythmia     paced  . Arthritis 09/18/11    "in my knees"  . Basal cell carcinoma of skin   . AAA (abdominal aortic aneurysm)   . Lung nodule     RLL  . Renal mass, left     History  Substance Use Topics  . Smoking status: Former Smoker -- 1.00 packs/day for 42 years    Types: Cigarettes    Quit date: 09/15/1973  . Smokeless tobacco: Former Neurosurgeon    Types: Chew  . Alcohol Use: No    Family History  Problem Relation Age of Onset  . Cancer Father     stomach  . Colon cancer Father   . Heart disease Brother   . Heart disease Brother   . Diabetes Other     granddaughter    Allergies  Allergen Reactions  . Aspirin     Stomach bleeds    Current outpatient prescriptions:acetaminophen (TYLENOL) 325 MG tablet, Take 650 mg by mouth every 6 (six) hours as needed. For fever, Disp: , Rfl: ;  amLODipine (NORVASC) 5 MG tablet, Take 1 tablet (5 mg total) by mouth daily., Disp: 30 tablet, Rfl: 5;  clopidogrel (PLAVIX) 75 MG tablet, Take 75 mg by mouth  daily.  , Disp: , Rfl: ;  CRESTOR 5 MG tablet, TAKE 1 TABLET BY MOUTH EVERY DAY, Disp: 30 tablet, Rfl: 6 ferrous sulfate 325 (65 FE) MG tablet, Take 325 mg by mouth daily with breakfast. , Disp: , Rfl: ;  Multiple Vitamins-Minerals (ICAPS) TABS, Take 1 tablet by mouth 2 (two) times daily., Disp: , Rfl: ;  NEXIUM 40 MG capsule, TAKE 1 CAPSULE BY MOUTH EVERY DAY, Disp: 30 capsule, Rfl: 6;  silodosin (RAPAFLO) 4 MG CAPS capsule, Take 8 mg by mouth daily with breakfast., Disp: , Rfl:   BP 154/73  Pulse 83  Ht 5\' 4"  (1.626 m)  Wt 167 lb 3.2 oz (75.841 kg)  BMI 28.69 kg/m2  SpO2 96%  Body mass index is 28.69 kg/(m^2).           Review of Systems denies chest pain, dyspnea on exertion, PND, orthopnea, claudication. Does have difficulty voiding on occasion and has had some hematuria. All of systems negative and complete review of systems     Objective:   Physical Exam BP 154/73  Pulse 83  Ht 5'  4" (1.626 m)  Wt 167 lb 3.2 oz (75.841 kg)  BMI 28.69 kg/m2  SpO2 96%  Gen.-alert and oriented x3 in no apparent distress HEENT normal for age Lungs no rhonchi or wheezing Cardiovascular regular rhythm -harsh two over four systolic ejection murmur carotid pulses 3+ palpable no bruits audible Abdomen soft nontender no palpable masses Musculoskeletal free of  major deformities Skin clear -no rashes Neurologic normal Lower extremities 3+ femoral and dorsalis pedis pulses palpable bilaterally with no edema  Today I reviewed the computer the CT scan which was obtained on 07/18/2013. Aneurysm is a good candidate for stent grafting but is not large enough to require that at the present time. His only 43 mm in maximum diameter. This is essentially the same as 6 months ago.        Assessment:     4.3 cm infrarenal abdominal aortic aneurysm-stent graft candidate    Plan:     Return in one year with duplex scan of the aortic aneurysm in our office If he has had a CT scan performed in Dr.  Hilario Quarry office shortly before his appointment we will not need to repeat the ultrasound but we can review the CT scan if the patient or son brings it to the appointment

## 2013-07-28 ENCOUNTER — Encounter: Payer: Self-pay | Admitting: Urology

## 2013-08-10 ENCOUNTER — Other Ambulatory Visit: Payer: Self-pay | Admitting: Dermatology

## 2013-08-24 ENCOUNTER — Encounter: Payer: Self-pay | Admitting: Physician Assistant

## 2013-08-24 ENCOUNTER — Ambulatory Visit (INDEPENDENT_AMBULATORY_CARE_PROVIDER_SITE_OTHER): Payer: Medicare Other | Admitting: Physician Assistant

## 2013-08-24 VITALS — BP 138/70 | HR 78 | Ht 60.0 in | Wt 166.0 lb

## 2013-08-24 DIAGNOSIS — I2510 Atherosclerotic heart disease of native coronary artery without angina pectoris: Secondary | ICD-10-CM

## 2013-08-24 DIAGNOSIS — I350 Nonrheumatic aortic (valve) stenosis: Secondary | ICD-10-CM

## 2013-08-24 DIAGNOSIS — I714 Abdominal aortic aneurysm, without rupture: Secondary | ICD-10-CM

## 2013-08-24 DIAGNOSIS — I359 Nonrheumatic aortic valve disorder, unspecified: Secondary | ICD-10-CM

## 2013-08-24 DIAGNOSIS — Z950 Presence of cardiac pacemaker: Secondary | ICD-10-CM

## 2013-08-24 NOTE — Patient Instructions (Signed)
Your physician wants you to follow-up in: 6 months with Dr. Jordan You will receive a reminder letter in the mail two months in advance. If you don't receive a letter, please call our office to schedule the follow-up appointment.   Your physician recommends that you continue on your current medications as directed. Please refer to the Current Medication list given to you today.  

## 2013-08-24 NOTE — Assessment & Plan Note (Signed)
Followup with Dr. Ladona Ridgel. last seen in 7/14 pacer functioning normally

## 2013-08-24 NOTE — Assessment & Plan Note (Signed)
No chest pain Continue medical management  

## 2013-08-24 NOTE — Assessment & Plan Note (Signed)
Patient has a significant murmur of aortic stenosis. His last 2-D echo was almost 2 years ago. I discussed doing a repeat with the patient but he would like to hold off. He is totally asymptomatic so I think it's reasonable for him to wait until he sees Dr. [place] in 6 months unless he has symptoms.

## 2013-08-24 NOTE — Progress Notes (Signed)
HPI:   This is a 79 year old male patient of Dr. [place]iland and Sharrell Ku who has a history of coronary artery disease status post remote stent to the LAD in 1999, last cath in 2013 managed medically. He also has a 4.3 cm infrarenal abdominal aortic aneurysm is being followed by Dr. Hart Rochester. He has a permanent pacemaker for bradycardia and hypertension as well as hyperlipidemia.  Patient comes in today for routine followup. He denies any chest pain, palpitations, dyspnea, dyspnea on exertion, dizziness, or presyncope. He golfs every Saturday without difficulty. Recent abdominal ultrasound showed his aneurysm is 4.3 cm and he needs followup in 1 year.  Allergies -- Aspirin    --  Stomach bleeds  Current Outpatient Prescriptions on File Prior to Visit: acetaminophen (TYLENOL) 325 MG tablet, Take 650 mg by mouth every 6 (six) hours as needed. For fever, Disp: , Rfl:  amLODipine (NORVASC) 5 MG tablet, Take 1 tablet (5 mg total) by mouth daily., Disp: 30 tablet, Rfl: 5 clopidogrel (PLAVIX) 75 MG tablet, Take 75 mg by mouth daily.  , Disp: , Rfl:  CRESTOR 5 MG tablet, TAKE 1 TABLET BY MOUTH EVERY DAY, Disp: 30 tablet, Rfl: 6 ferrous sulfate 325 (65 FE) MG tablet, Take 325 mg by mouth daily with breakfast. , Disp: , Rfl:  Multiple Vitamins-Minerals (ICAPS) TABS, Take 1 tablet by mouth 2 (two) times daily., Disp: , Rfl:  NEXIUM 40 MG capsule, TAKE 1 CAPSULE BY MOUTH EVERY DAY, Disp: 30 capsule, Rfl: 6 silodosin (RAPAFLO) 4 MG CAPS capsule, Take 8 mg by mouth daily with breakfast., Disp: , Rfl:   No current facility-administered medications on file prior to visit.   Past Medical History:   CAD (coronary artery disease)                                  Comment:Remote stent to LAD in 1999. Last cath in 2004               and he is managed medically   Anemia                                                       Hyperlipidemia                                               Pacemaker                                                       Comment:due to bradycardia; placed in 2004   Aortic stenosis                                              BPH (benign prostatic hyperplasia)  HTN (hypertension)                                           Colon polyps                                                 Blood transfusion                                            GERD (gastroesophageal reflux disease)                       Hemorrhoid                                                   Complication of anesthesia                                   Dysrhythmia                                                    Comment:paced   Arthritis                                       09/18/11         Comment:"in my knees"   Basal cell carcinoma of skin                                 AAA (abdominal aortic aneurysm)                              Lung nodule                                                    Comment:RLL   Renal mass, left                                            Past Surgical History:   NASAL HEMORRHAGE CONTROL                         1980's       INSERT / REPLACE / REMOVE PACEMAKER              2004  Comment:initial placement; [employer]   INSERT / REPLACE / REMOVE PACEMAKER              02/14/2011     CARDIAC CATHETERIZATION                          2004           Comment:Managed medically   CORONARY ANGIOPLASTY WITH STENT PLACEMENT        05/09/1998      Comment:"1"; LAD   TONSILLECTOMY                                                   Comment:"when I was a teenager"   SKIN CANCER EXCISION                             09/18/11         Comment:"have had a couple hundred of them removed               since I was in my 40's"   CARDIAC CATHETERIZATION                                      Review of patient's family history indicates:   Cancer                         Father                     Comment: stomach   Colon cancer                   Father                    Heart disease                  Brother                  Heart disease                  Brother                  Diabetes                       Other                      Comment: granddaughter   Social History   Marital Status: Married             Spouse Name:                      Years of Education:                 Number of children: 2           Occupational History Occupation          Psychiatric nurse              Retired  w[employer]  Social History Main Topics   Smoking Status: Former Smoker                   Packs/Day: 1.00  Years: 42        Types: Cigarettes     Quit date: 09/15/1973   Smokeless Status: Former Neurosurgeon                        Types: Chew   Alcohol Use: No             Drug Use: No             Sexual Activity: Yes                Other Topics            Concern   None on file  Social History Narrative   None on file    ROS: History of present illness otherwise negative   PHYSICAL EXAM: Well-nournished, in no acute distress. Neck: No JVD, HJR, Bruit, or thyroid enlargement  Lungs: No tachypnea, clear without wheezing, rales, or rhonchi  Cardiovascular: RRR, PMI not displaced, 3/6 harsh systolic murmur at the left sternal border with decreased S2, no gallops, bruit, thrill, or heave.  Abdomen: BS normal. Soft without organomegaly, masses, lesions or tenderness.  Extremities: without cyanosis, clubbing or edema. Good distal pulses bilateral  SKin: Warm, no lesions or rashes   Musculoskeletal: No deformities  Neuro: no focal signs  BP 138/70  Pulse 78  Ht 5' (1.524 m)  Wt 166 lb (75.297 kg)  BMI 32.42 kg/m2    EKG: AV paced rhythm 2-D echo 10/2011: Study Conclusions  - Left ventricle: The cavity size was normal. Wall thickness   was increased in a pattern of moderate LVH. The estimated   ejection fraction was 65%. Wall motion was normal; there   were no regional wall motion abnormalities. - Aortic  valve: There are calcified leaflets with moderate   aortic stenosis. Trivial regurgitation. Mean gradient:   31mm Hg (S). Peak gradient: 53mm Hg (S). - Mitral valve: Moderately calcified annulus. Trivial   regurgitation. - Left atrium: The atrium was mildly dilated. - Impressions: By history there is a pacer. Some of the   beats are paced. I did not see the RV lead. Impressions:  - By history there is a pacer. Some of the beats are paced.   I did not see the RV lead.   Cath Impression:  Coronary arteries look fairly good.  Ostial lesion in small diagonal is not amenable to PCI/Stent Medical Rx  Chest pains more likely from AS.  Avoid nitro in future with severe AS and history of "syncope" When using it  Charlton Haws 09/19/2011

## 2013-08-24 NOTE — Assessment & Plan Note (Signed)
4.3 cm a recent ultrasound. Followed closely by Dr. Hart Rochester. Recheck in 1 year.

## 2013-08-30 ENCOUNTER — Ambulatory Visit: Payer: Medicare Other | Admitting: Vascular Surgery

## 2013-09-01 ENCOUNTER — Ambulatory Visit: Payer: Medicare Other | Admitting: Cardiology

## 2013-09-05 ENCOUNTER — Other Ambulatory Visit: Payer: Self-pay | Admitting: Cardiology

## 2013-09-05 ENCOUNTER — Ambulatory Visit (INDEPENDENT_AMBULATORY_CARE_PROVIDER_SITE_OTHER): Payer: Medicare Other | Admitting: *Deleted

## 2013-09-05 ENCOUNTER — Other Ambulatory Visit: Payer: Self-pay

## 2013-09-05 DIAGNOSIS — I441 Atrioventricular block, second degree: Secondary | ICD-10-CM

## 2013-09-05 LAB — MDC_IDC_ENUM_SESS_TYPE_REMOTE
Battery Remaining Longevity: 105 mo
Brady Statistic AP VS Percent: 0 %
Brady Statistic AS VP Percent: 64 %
Date Time Interrogation Session: 20141222175832
Lead Channel Impedance Value: 486 Ohm
Lead Channel Pacing Threshold Amplitude: 0.75 V
Lead Channel Pacing Threshold Amplitude: 0.75 V
Lead Channel Pacing Threshold Pulse Width: 0.4 ms
Lead Channel Pacing Threshold Pulse Width: 0.4 ms
Lead Channel Setting Pacing Amplitude: 2 V
Lead Channel Setting Sensing Sensitivity: 1 mV
MDC IDC MSMT BATTERY IMPEDANCE: 151 Ohm
MDC IDC MSMT BATTERY VOLTAGE: 2.77 V
MDC IDC MSMT LEADCHNL RA IMPEDANCE VALUE: 471 Ohm
MDC IDC MSMT LEADCHNL RA SENSING INTR AMPL: 2.8 mV
MDC IDC SET LEADCHNL RV PACING AMPLITUDE: 2.5 V
MDC IDC SET LEADCHNL RV PACING PULSEWIDTH: 0.4 ms
MDC IDC STAT BRADY AP VP PERCENT: 36 %
MDC IDC STAT BRADY AS VS PERCENT: 0 %

## 2013-09-05 MED ORDER — AMLODIPINE BESYLATE 5 MG PO TABS: 5.0000 mg | ORAL_TABLET | Freq: Every day | ORAL | Status: DC

## 2013-09-28 ENCOUNTER — Encounter: Payer: Self-pay | Admitting: *Deleted

## 2013-10-14 ENCOUNTER — Encounter: Payer: Self-pay | Admitting: Internal Medicine

## 2013-11-15 ENCOUNTER — Other Ambulatory Visit: Payer: Self-pay | Admitting: Dermatology

## 2013-12-07 ENCOUNTER — Ambulatory Visit (INDEPENDENT_AMBULATORY_CARE_PROVIDER_SITE_OTHER): Payer: Medicare Other | Admitting: *Deleted

## 2013-12-07 DIAGNOSIS — I441 Atrioventricular block, second degree: Secondary | ICD-10-CM

## 2013-12-07 LAB — MDC_IDC_ENUM_SESS_TYPE_REMOTE
Battery Remaining Longevity: 8.5
Brady Statistic AP VP Percent: 37.3 %
Brady Statistic AP VS Percent: 0.1 % — CL
Brady Statistic AS VS Percent: 0.2 %
Lead Channel Pacing Threshold Amplitude: 0.75 V
Lead Channel Pacing Threshold Amplitude: 0.875 V
Lead Channel Pacing Threshold Pulse Width: 0.4 ms
Lead Channel Pacing Threshold Pulse Width: 0.4 ms
Lead Channel Sensing Intrinsic Amplitude: 1 mV
Lead Channel Setting Pacing Amplitude: 2.5 V
MDC IDC MSMT BATTERY IMPEDANCE: 175 Ohm
MDC IDC MSMT BATTERY VOLTAGE: 2.77 V
MDC IDC MSMT LEADCHNL RA IMPEDANCE VALUE: 453 Ohm
MDC IDC MSMT LEADCHNL RV IMPEDANCE VALUE: 473 Ohm
MDC IDC SET LEADCHNL RA PACING AMPLITUDE: 2 V
MDC IDC SET LEADCHNL RV PACING PULSEWIDTH: 0.4 ms
MDC IDC SET LEADCHNL RV SENSING SENSITIVITY: 1 mV
MDC IDC STAT BRADY AS VP PERCENT: 62.5 %

## 2013-12-14 ENCOUNTER — Encounter: Payer: Self-pay | Admitting: *Deleted

## 2013-12-22 ENCOUNTER — Encounter: Payer: Self-pay | Admitting: Internal Medicine

## 2013-12-28 ENCOUNTER — Other Ambulatory Visit (HOSPITAL_COMMUNITY): Payer: Self-pay | Admitting: Urology

## 2013-12-28 DIAGNOSIS — D4959 Neoplasm of unspecified behavior of other genitourinary organ: Secondary | ICD-10-CM

## 2014-01-08 ENCOUNTER — Other Ambulatory Visit: Payer: Self-pay | Admitting: Cardiology

## 2014-01-16 ENCOUNTER — Ambulatory Visit (HOSPITAL_COMMUNITY)
Admission: RE | Admit: 2014-01-16 | Discharge: 2014-01-16 | Disposition: A | Discharge status: Home / Self Care | Payer: Medicare Other | Source: Ambulatory Visit | Attending: Urology | Admitting: Urology

## 2014-01-16 DIAGNOSIS — N289 Disorder of kidney and ureter, unspecified: Secondary | ICD-10-CM | POA: Insufficient documentation

## 2014-01-16 DIAGNOSIS — D4959 Neoplasm of unspecified behavior of other genitourinary organ: Secondary | ICD-10-CM

## 2014-01-19 ENCOUNTER — Ambulatory Visit (HOSPITAL_COMMUNITY)
Admission: RE | Admit: 2014-01-19 | Discharge: 2014-01-19 | Disposition: A | Discharge status: Home / Self Care | Payer: Medicare Other | Source: Ambulatory Visit | Attending: Urology | Admitting: Urology

## 2014-01-19 ENCOUNTER — Other Ambulatory Visit (HOSPITAL_COMMUNITY): Payer: Self-pay | Admitting: Urology

## 2014-01-19 DIAGNOSIS — R918 Other nonspecific abnormal finding of lung field: Secondary | ICD-10-CM

## 2014-01-19 DIAGNOSIS — N289 Disorder of kidney and ureter, unspecified: Secondary | ICD-10-CM | POA: Insufficient documentation

## 2014-01-19 DIAGNOSIS — R911 Solitary pulmonary nodule: Secondary | ICD-10-CM | POA: Insufficient documentation

## 2014-02-20 ENCOUNTER — Other Ambulatory Visit: Payer: Self-pay | Admitting: Cardiology

## 2014-02-21 ENCOUNTER — Other Ambulatory Visit: Payer: Self-pay | Admitting: Cardiology

## 2014-03-05 ENCOUNTER — Other Ambulatory Visit: Payer: Self-pay | Admitting: Cardiology

## 2014-03-30 NOTE — Telephone Encounter (Signed)
Close Encounter 

## 2014-04-17 ENCOUNTER — Other Ambulatory Visit: Payer: Self-pay | Admitting: Dermatology

## 2014-04-25 ENCOUNTER — Encounter: Payer: Self-pay | Admitting: *Deleted

## 2014-04-29 ENCOUNTER — Other Ambulatory Visit: Payer: Self-pay | Admitting: Cardiology

## 2014-05-15 ENCOUNTER — Other Ambulatory Visit: Payer: Self-pay | Admitting: Dermatology

## 2014-05-16 ENCOUNTER — Ambulatory Visit (INDEPENDENT_AMBULATORY_CARE_PROVIDER_SITE_OTHER): Payer: Medicare Other | Admitting: Cardiology

## 2014-05-16 ENCOUNTER — Encounter: Payer: Self-pay | Admitting: Cardiology

## 2014-05-16 VITALS — BP 130/70 | HR 72 | Ht 65.0 in | Wt 163.0 lb

## 2014-05-16 DIAGNOSIS — I441 Atrioventricular block, second degree: Secondary | ICD-10-CM

## 2014-05-16 DIAGNOSIS — I359 Nonrheumatic aortic valve disorder, unspecified: Secondary | ICD-10-CM

## 2014-05-16 DIAGNOSIS — I2510 Atherosclerotic heart disease of native coronary artery without angina pectoris: Secondary | ICD-10-CM

## 2014-05-16 DIAGNOSIS — I350 Nonrheumatic aortic (valve) stenosis: Secondary | ICD-10-CM

## 2014-05-16 DIAGNOSIS — Z950 Presence of cardiac pacemaker: Secondary | ICD-10-CM

## 2014-05-16 NOTE — Patient Instructions (Signed)
Continue your current therapy  I will plan to see you in 6 months.

## 2014-05-16 NOTE — Progress Notes (Signed)
[name] Date of Birth: 1917/11/14   History of Present Illness: Jordan [name] is seen today for a followup visit. He is now 80 years old. He has a history of coronary disease with remote stenting of the LAD in 1999. Cardiac catheterization January 2013 showed nonobstructive disease. He has a history of bradycardia requiring pacemaker implant. He has a history of moderate aortic stenosis by Echo in 2013. On followup today he is feeling very well. He denies any chest pain or shortness of breath. He remains active and plays golf at least once a week.  He has a 4.2 cm abdominal aortic aneurysm followed  by Dr. Kellie Simmering. He was seen by pulmonary previously for pulmonary nodule but this was felt to be unchanged from 5 years ago.   Current Outpatient Prescriptions on File Prior to Visit  Medication Sig Dispense Refill  . acetaminophen (TYLENOL) 325 MG tablet Take 650 mg by mouth every 6 (six) hours as needed. For fever      . clopidogrel (PLAVIX) 75 MG tablet Take 75 mg by mouth daily.        . CRESTOR 5 MG tablet TAKE 1 TABLET BY MOUTH EVERY DAY  30 tablet  1  . ferrous sulfate 325 (65 FE) MG tablet Take 325 mg by mouth daily with breakfast.       . Multiple Vitamins-Minerals (ICAPS) TABS Take 1 tablet by mouth 2 (two) times daily.      Marland Kitchen NEXIUM 40 MG capsule TAKE 1 CAPSULE BY MOUTH EVERY DAY  30 capsule  6  . NORVASC 5 MG tablet TAKE 1 TABLET BY MOUTH EVERY DAY  30 tablet  2  . silodosin (RAPAFLO) 4 MG CAPS capsule Take 8 mg by mouth daily with breakfast.       No current facility-administered medications on file prior to visit.    Allergies  Allergen Reactions  . Aspirin     Stomach bleeds    Past Medical History  Diagnosis Date  . CAD (coronary artery disease)     Remote stent to LAD in 1999. Last cath in 2004 and he is managed medically  . Anemia   . Hyperlipidemia   . Pacemaker     due to bradycardia; placed in 2004  . Aortic stenosis   . BPH (benign prostatic hyperplasia)    . HTN (hypertension)   . Colon polyps   . Blood transfusion   . GERD (gastroesophageal reflux disease)   . Hemorrhoid   . Complication of anesthesia   . Dysrhythmia     paced  . Arthritis 09/18/11    "in my knees"  . Basal cell carcinoma of skin   . AAA (abdominal aortic aneurysm)   . Lung nodule     RLL  . Renal mass, left     Past Surgical History  Procedure Laterality Date  . Nasal hemorrhage control  1980's  . Insert / replace / remove pacemaker  2004    initial placement; [employer]  . Insert / replace / remove pacemaker  02/14/2011  . Cardiac catheterization  2004    Managed medically  . Coronary angioplasty with stent placement  05/09/1998    "1"; LAD  . Tonsillectomy      "when I was a teenager"  . Skin cancer excision  09/18/11    "have had a couple hundred of them removed since I was in my 71's"  . Cardiac catheterization      History  Smoking status  .  Former Smoker -- 1.00 packs/day for 42 years  . Types: Cigarettes  . Quit date: 09/15/1973  Smokeless tobacco  . Former Systems developer  . Types: Chew    History  Alcohol Use No    Family History  Problem Relation Age of Onset  . Cancer Father     stomach  . Colon cancer Father   . Heart disease Brother   . Heart disease Brother   . Diabetes Other     granddaughter    Review of Systems: As noted in history of present illness. All other systems were reviewed and are negative.  Physical Exam: BP 130/70  Pulse 72  Ht 5\' 5"  (1.651 m)  Wt 163 lb (73.936 kg)  BMI 27.12 kg/m2 Patient is very pleasant and in no acute distress. He looks younger than his stated age. Skin is warm and dry. Color is normal.  HEENT is unremarkable. Normocephalic/atraumatic. PERRL. Sclera are nonicteric. Neck is supple. No masses. No JVD. Lungs are clear. Cardiac exam shows a regular rate and rhythm. He has a harsh 3/6 systolic outflow murmur. No diastolic murmur. Abdomen is soft. Extremities reveal trace left ankle edema.  Gait and ROM  are intact. No gross neurologic deficits noted.   LABORATORY DATA:   Assessment / Plan: 1. Coronary disease with remote stenting of the LAD in 1999. Cardiac catheterization Jan 2013 showed nonobstructive disease. He remains asymptomatic. We will continue with aspirin and statin therapy.  2. Moderate aortic stenosis. Last echocardiogram in February 2013. Prominent murmur.He remains asymptomatic. At his age I don't think routine Echo is beneficial. I would only order if he is symptomatic.   3. Sinus node dysfunction with bradycardia. Status post pacemaker implant. Pacemaker followup has been satisfactory. Follow up in pacer clinic.  4. Hyperlipidemia. On chronic Crestor therapy. Lab work followed by his primary.   5. AAA 4.2 cm. Followed by Dr. Kellie Simmering.  6. RLL pulmonary nodule. Chronic  I will follow up in 6 months.

## 2014-05-23 ENCOUNTER — Other Ambulatory Visit: Payer: Self-pay | Admitting: Cardiology

## 2014-05-23 ENCOUNTER — Encounter: Payer: Self-pay | Admitting: Internal Medicine

## 2014-05-23 ENCOUNTER — Ambulatory Visit (INDEPENDENT_AMBULATORY_CARE_PROVIDER_SITE_OTHER): Payer: Medicare Other | Admitting: Internal Medicine

## 2014-05-23 VITALS — BP 144/70 | HR 84 | Ht 62.0 in | Wt 161.4 lb

## 2014-05-23 DIAGNOSIS — I441 Atrioventricular block, second degree: Secondary | ICD-10-CM

## 2014-05-23 DIAGNOSIS — I350 Nonrheumatic aortic (valve) stenosis: Secondary | ICD-10-CM

## 2014-05-23 DIAGNOSIS — I359 Nonrheumatic aortic valve disorder, unspecified: Secondary | ICD-10-CM

## 2014-05-23 DIAGNOSIS — Z950 Presence of cardiac pacemaker: Secondary | ICD-10-CM

## 2014-05-23 LAB — MDC_IDC_ENUM_SESS_TYPE_INCLINIC
Battery Remaining Longevity: 90 mo
Battery Voltage: 2.77 V
Brady Statistic AP VP Percent: 37 %
Brady Statistic AS VP Percent: 63 %
Brady Statistic AS VS Percent: 0 %
Lead Channel Pacing Threshold Amplitude: 0.75 V
Lead Channel Pacing Threshold Pulse Width: 0.4 ms
Lead Channel Sensing Intrinsic Amplitude: 1.4 mV
Lead Channel Setting Pacing Amplitude: 2.5 V
Lead Channel Setting Pacing Pulse Width: 0.4 ms
MDC IDC MSMT BATTERY IMPEDANCE: 249 Ohm
MDC IDC MSMT LEADCHNL RA IMPEDANCE VALUE: 454 Ohm
MDC IDC MSMT LEADCHNL RA PACING THRESHOLD PULSEWIDTH: 0.4 ms
MDC IDC MSMT LEADCHNL RV IMPEDANCE VALUE: 462 Ohm
MDC IDC MSMT LEADCHNL RV PACING THRESHOLD AMPLITUDE: 0.5 V
MDC IDC MSMT LEADCHNL RV SENSING INTR AMPL: 5.6 mV
MDC IDC SESS DTM: 20150908151356
MDC IDC SET LEADCHNL RA PACING AMPLITUDE: 2 V
MDC IDC SET LEADCHNL RV SENSING SENSITIVITY: 1 mV
MDC IDC STAT BRADY AP VS PERCENT: 0 %

## 2014-05-23 NOTE — Assessment & Plan Note (Signed)
His medtronic DDD PM is working normally. Will recheck in several months. 

## 2014-05-23 NOTE — Progress Notes (Signed)
HPI Jordan [name] returns today for followup. He is a pleasant 80 yo man with a h/o symptomatic bradycardia, s/p PPM insertion, HTN, CAD, and dyslipidemia. In the interim, he has been stable. He denies chest pain. Minimal peripheral edema and sob. No syncope. He denies chest pain. He is still playing golf.  Allergies  Allergen Reactions  . Aspirin Other (See Comments)    Stomach bleeds     Current Outpatient Prescriptions  Medication Sig Dispense Refill  . acetaminophen (TYLENOL) 325 MG tablet Take 650 mg by mouth every 6 (six) hours as needed. For fever      . clopidogrel (PLAVIX) 75 MG tablet Take 75 mg by mouth daily.        . CRESTOR 5 MG tablet TAKE 1 TABLET BY MOUTH EVERY DAY  30 tablet  1  . ferrous sulfate 325 (65 FE) MG tablet Take 325 mg by mouth daily with breakfast.       . Multiple Vitamins-Minerals (ICAPS) TABS Take 1 tablet by mouth 2 (two) times daily.      Marland Kitchen NEXIUM 40 MG capsule TAKE 1 CAPSULE BY MOUTH EVERY DAY  30 capsule  6  . NORVASC 5 MG tablet TAKE 1 TABLET BY MOUTH EVERY DAY  30 tablet  2  . silodosin (RAPAFLO) 4 MG CAPS capsule Take 8 mg by mouth daily with breakfast.       No current facility-administered medications for this visit.     Past Medical History  Diagnosis Date  . CAD (coronary artery disease)     Remote stent to LAD in 1999. Last cath in 2004 and he is managed medically  . Anemia   . Hyperlipidemia   . Pacemaker     due to bradycardia; placed in 2004  . Aortic stenosis   . BPH (benign prostatic hyperplasia)   . HTN (hypertension)   . Colon polyps   . Blood transfusion   . GERD (gastroesophageal reflux disease)   . Hemorrhoid   . Complication of anesthesia   . Dysrhythmia     paced  . Arthritis 09/18/11    "in my knees"  . Basal cell carcinoma of skin   . AAA (abdominal aortic aneurysm)   . Lung nodule     RLL  . Renal mass, left     ROS:   All systems reviewed and negative except as noted in the HPI.   Past Surgical History   Procedure Laterality Date  . Nasal hemorrhage control  1980's  . Insert / replace / remove pacemaker  2004    initial placement; [employer]  . Insert / replace / remove pacemaker  02/14/2011  . Cardiac catheterization  2004    Managed medically  . Coronary angioplasty with stent placement  05/09/1998    "1"; LAD  . Tonsillectomy      "when I was a teenager"  . Skin cancer excision  09/18/11    "have had a couple hundred of them removed since I was in my 51's"  . Cardiac catheterization       Family History  Problem Relation Age of Onset  . Cancer Father     stomach  . Colon cancer Father   . Heart disease Brother   . Heart disease Brother   . Diabetes Other     granddaughter     History   Social History  . Marital Status: Married    Spouse Name: N/A    Number of Children: 2  .  Years of Education: N/A   Occupational History  . Retired     Building control surveyor   Social History Main Topics  . Smoking status: Former Smoker -- 1.00 packs/day for 42 years    Types: Cigarettes    Quit date: 09/15/1973  . Smokeless tobacco: Former Systems developer    Types: Chew  . Alcohol Use: No  . Drug Use: No  . Sexual Activity: Yes   Other Topics Concern  . Not on file   Social History Narrative  . No narrative on file     BP 144/70  Pulse 84  Ht 5\' 2"  (1.575 m)  Wt 161 lb 6.4 oz (73.211 kg)  BMI 29.51 kg/m2  Physical Exam:  stable appearing 80 yo man, NAD HEENT: Unremarkable Neck:  No JVD, no thyromegally Lungs:  Clear with no wheezes, rales, or rhonchi HEART:  Regular rate rhythm, no murmurs, no rubs, no clicks Abd:  soft, positive bowel sounds, no organomegally, no rebound, no guarding Ext:  2 plus pulses, no edema, no cyanosis, no clubbing Skin:  No rashes no nodules Neuro:  CN II through XII intact, motor grossly intact   DEVICE  Normal device function.  See PaceArt for details.   Assess/Plan:

## 2014-05-23 NOTE — Patient Instructions (Signed)
Your physician wants you to follow-up in: 12 months with Dr Knox Saliva will receive a reminder letter in the mail two months in advance. If you don't receive a letter, please call our office to schedule the follow-up appointment.    Remote monitoring is used to monitor your Pacemaker of ICD from home. This monitoring reduces the number of office visits required to check your device to one time per year. It allows [place] to keep an eye on the functioning of your device to ensure it is working properly. You are scheduled for a device check from home on 08/24/14. You may send your transmission at any time that day. If you have a wireless device, the transmission will be sent automatically. After your physician reviews your transmission, you will receive a postcard with your next transmission date.

## 2014-05-23 NOTE — Assessment & Plan Note (Signed)
Note Dr. Doug Sou note from several days ago. He is asymptomatic. I suspect his AS has progressed based on exam but no symptoms. Will follow.

## 2014-05-29 ENCOUNTER — Telehealth: Payer: Self-pay | Admitting: Cardiology

## 2014-05-29 NOTE — Telephone Encounter (Signed)
Returning your call. °

## 2014-05-29 NOTE — Telephone Encounter (Signed)
Returned call to patient.He stated he needed wife's alprazolam and losartan refilled.Refills sent to pharmacy.

## 2014-06-22 ENCOUNTER — Telehealth: Payer: Self-pay | Admitting: Cardiology

## 2014-06-22 NOTE — Telephone Encounter (Signed)
Returned call to patient he stated he has been having swelling in both feet and ankles for the past 3 to 4 days.No sob.Stated he has been eating a lot of crackers.Advised to eat salt free crackers.Spoke to [place] he agreed.Advised to call back if swelling don't improve.

## 2014-06-22 NOTE — Telephone Encounter (Signed)
Pt called in stating that he was returning Cheryl's call , he did not state what her call was in reference to. Please call back  Thanks

## 2014-06-28 ENCOUNTER — Other Ambulatory Visit: Payer: Self-pay

## 2014-06-28 MED ORDER — ROSUVASTATIN CALCIUM 5 MG PO TABS: ORAL_TABLET | ORAL | Status: DC

## 2014-06-29 ENCOUNTER — Other Ambulatory Visit: Payer: Self-pay

## 2014-06-29 MED ORDER — ROSUVASTATIN CALCIUM 5 MG PO TABS: ORAL_TABLET | ORAL | Status: DC

## 2014-07-11 ENCOUNTER — Telehealth: Payer: Self-pay | Admitting: Cardiology

## 2014-07-11 NOTE — Telephone Encounter (Signed)
Returned call to patient he stated [facility] will not refill [facility] [address] called spoke to pharmacist she stated patient does have 5 more refills left.Stated they forgot to mark the bottle.Stated she will get refill ready.

## 2014-07-11 NOTE — Telephone Encounter (Signed)
Pt called in stating that he was returning Cheryl's call in regards to Alprazolam. Please call  thanks

## 2014-07-31 ENCOUNTER — Encounter: Payer: Self-pay | Admitting: Vascular Surgery

## 2014-08-01 ENCOUNTER — Encounter: Payer: Self-pay | Admitting: Vascular Surgery

## 2014-08-01 ENCOUNTER — Ambulatory Visit (INDEPENDENT_AMBULATORY_CARE_PROVIDER_SITE_OTHER): Payer: Medicare Other | Admitting: Vascular Surgery

## 2014-08-01 ENCOUNTER — Inpatient Hospital Stay (HOSPITAL_COMMUNITY)
Admission: RE | Admit: 2014-08-01 | Discharge: 2014-08-01 | Disposition: A | Discharge status: Home / Self Care | Payer: Medicare Other | Source: Ambulatory Visit

## 2014-08-01 VITALS — BP 168/72 | HR 62 | Resp 16 | Ht 64.0 in | Wt 163.0 lb

## 2014-08-01 DIAGNOSIS — I714 Abdominal aortic aneurysm, without rupture, unspecified: Secondary | ICD-10-CM

## 2014-08-01 NOTE — Addendum Note (Signed)
Addended by: Mena Goes on: 08/01/2014 12:21 PM   Modules accepted: Orders

## 2014-08-01 NOTE — Progress Notes (Signed)
Subjective:     Patient ID: Jordan [name], male   DOB: [birth year], 80 y.o.   MRN: [id number]  HPI this 80 year old male returns for continued follow-up regarding his abdominal aortic aneurysm. I have been following this for the past few years. It has been stable between 4 and 4.3 cm. Patient is being followed for a left renal mass and had CT scan performed 07/11/2014. This revealed the aneurysm to be 4.3 cm-unchanged from last year. Patient denies any abdominal or back symptoms. He continues to have a 2 cm left renal mass which is irregular and cystic and possibly represents a malignancy. He is followed by Dr. Kathie [name]. He continues to be quite active playing golf at least once a week and routinely shooting his age. He denies claudication symptoms.  Past Medical History  Diagnosis Date  . CAD (coronary artery disease)     Remote stent to LAD in 1999. Last cath in 2004 and he is managed medically  . Anemia   . Hyperlipidemia   . Pacemaker     due to bradycardia; placed in 2004  . Aortic stenosis   . BPH (benign prostatic hyperplasia)   . HTN (hypertension)   . Colon polyps   . Blood transfusion   . GERD (gastroesophageal reflux disease)   . Hemorrhoid   . Complication of anesthesia   . Dysrhythmia     paced  . Arthritis 09/18/11    "in my knees"  . Basal cell carcinoma of skin   . AAA (abdominal aortic aneurysm)   . Lung nodule     RLL  . Renal mass, left     History  Substance Use Topics  . Smoking status: Former Smoker -- 1.00 packs/day for 42 years    Types: Cigarettes    Quit date: 09/15/1973  . Smokeless tobacco: Former Systems developer    Types: Chew  . Alcohol Use: No    Family History  Problem Relation Age of Onset  . Cancer Father     stomach  . Colon cancer Father   . Heart disease Brother   . Heart disease Brother   . Diabetes Other     granddaughter    Allergies  Allergen Reactions  . Aspirin Other (See Comments)    Stomach bleeds    Current outpatient  prescriptions: acetaminophen (TYLENOL) 325 MG tablet, Take 650 mg by mouth every 6 (six) hours as needed. For fever, Disp: , Rfl: ;  clopidogrel (PLAVIX) 75 MG tablet, Take 75 mg by mouth daily.  , Disp: , Rfl: ;  ferrous sulfate 325 (65 FE) MG tablet, Take 325 mg by mouth daily with breakfast. , Disp: , Rfl: ;  Multiple Vitamins-Minerals (ICAPS) TABS, Take 1 tablet by mouth 2 (two) times daily., Disp: , Rfl:  NEXIUM 40 MG capsule, TAKE 1 CAPSULE BY MOUTH EVERY DAY, Disp: 30 capsule, Rfl: 6;  NORVASC 5 MG tablet, TAKE 1 TABLET BY MOUTH EVERY DAY, Disp: 30 tablet, Rfl: 5;  rosuvastatin (CRESTOR) 5 MG tablet, TAKE 1 TABLET BY MOUTH EVERY DAY, Disp: 30 tablet, Rfl: 11;  silodosin (RAPAFLO) 4 MG CAPS capsule, Take 8 mg by mouth daily with breakfast., Disp: , Rfl:   BP 168/72 mmHg  Pulse 62  Resp 16  Ht 5\' 4"  (1.626 m)  Wt 163 lb (73.936 kg)  BMI 27.97 kg/m2  Body mass index is 27.97 kg/(m^2).          Review of SystemsDenies chest pain, dyspnea on exertion, PND,  orthopnea, hemoptysis. Patient does have pacemaker in place which she has had since the mid 1980s followed by Dr. Crissie [name]. Also has left renal mass as noted in history of present illness. Complains of mild edema both lower extremities. Other systems negative and a complete review of systems     Objective:   Physical Exam BP 168/72 mmHg  Pulse 62  Resp 16  Ht 5\' 4"  (1.626 m)  Wt 163 lb (73.936 kg)  BMI 27.97 kg/m2  Gen.-alert and oriented x3 in no apparent distress HEENT normal for age Lungs no rhonchi or wheezing Cardiovascular regular rhythm no murmurs carotid pulses 3+ palpable no bruits audible Abdomen soft nontender no palpable masses Musculoskeletal free of  major deformities Skin clear -no rashes Neurologic normal Lower extremities 3+ femoral and dorsalis pedis pulses palpable bilaterally with1+ edema bilaterally.  Patient had CT scan performed on 07/11/2014. I reviewed daily report which reveals aneurysm to  continue to be 4.3 cm in maximum diameter.       Assessment:     Stable 4.3 cm abdominal aortic aneurysm 2 cm left renal mass-possible neoplasm followed by Dr.Ottelin     Plan:     Return in one year. If patient has had CT scan for continued surveillance of the renal mass within a few months of return appointment will not obtain duplex scan in our office-if not we will get duplex scan to monitor size of aneurysm

## 2014-08-24 ENCOUNTER — Encounter (HOSPITAL_COMMUNITY): Payer: Self-pay | Admitting: Cardiovascular Disease

## 2014-08-24 ENCOUNTER — Ambulatory Visit (INDEPENDENT_AMBULATORY_CARE_PROVIDER_SITE_OTHER): Payer: Medicare Other | Admitting: *Deleted

## 2014-08-24 DIAGNOSIS — Z950 Presence of cardiac pacemaker: Secondary | ICD-10-CM

## 2014-08-24 DIAGNOSIS — I441 Atrioventricular block, second degree: Secondary | ICD-10-CM

## 2014-08-24 NOTE — Progress Notes (Signed)
Remote pacemaker transmission.   

## 2014-09-05 LAB — MDC_IDC_ENUM_SESS_TYPE_REMOTE
Battery Remaining Longevity: 91 mo
Battery Voltage: 2.77 V
Brady Statistic AP VP Percent: 38 %
Lead Channel Pacing Threshold Amplitude: 1 V
Lead Channel Pacing Threshold Pulse Width: 0.4 ms
Lead Channel Sensing Intrinsic Amplitude: 2.8 mV
Lead Channel Setting Pacing Amplitude: 2 V
Lead Channel Setting Pacing Amplitude: 2.5 V
Lead Channel Setting Pacing Pulse Width: 0.4 ms
Lead Channel Setting Sensing Sensitivity: 1 mV
MDC IDC MSMT BATTERY IMPEDANCE: 249 Ohm
MDC IDC MSMT LEADCHNL RA IMPEDANCE VALUE: 453 Ohm
MDC IDC MSMT LEADCHNL RA PACING THRESHOLD AMPLITUDE: 0.625 V
MDC IDC MSMT LEADCHNL RA PACING THRESHOLD PULSEWIDTH: 0.4 ms
MDC IDC MSMT LEADCHNL RV IMPEDANCE VALUE: 470 Ohm
MDC IDC SESS DTM: 20151210134302
MDC IDC STAT BRADY AP VS PERCENT: 0 %
MDC IDC STAT BRADY AS VP PERCENT: 62 %
MDC IDC STAT BRADY AS VS PERCENT: 0 %

## 2014-09-19 ENCOUNTER — Encounter: Payer: Self-pay | Admitting: Cardiology

## 2014-09-25 ENCOUNTER — Encounter: Payer: Self-pay | Admitting: Internal Medicine

## 2014-10-03 ENCOUNTER — Encounter: Payer: Self-pay | Admitting: Cardiology

## 2014-11-15 ENCOUNTER — Other Ambulatory Visit: Payer: Self-pay

## 2014-11-15 MED ORDER — AMLODIPINE BESYLATE 5 MG PO TABS: 5.0000 mg | ORAL_TABLET | Freq: Every day | ORAL | Status: DC

## 2014-11-15 NOTE — Telephone Encounter (Signed)
Peter M Martinique, MD at 05/16/2014 8:05 PM  NORVASC 5 MG tabletTAKE 1 TABLET BY MOUTH EVERY DAY Patient Instructions   Continue your current therapy  I will plan to see you in 6 months

## 2014-11-27 ENCOUNTER — Telehealth: Payer: Self-pay | Admitting: Cardiology

## 2014-11-27 ENCOUNTER — Ambulatory Visit (INDEPENDENT_AMBULATORY_CARE_PROVIDER_SITE_OTHER): Payer: Medicare Other | Admitting: *Deleted

## 2014-11-27 DIAGNOSIS — I441 Atrioventricular block, second degree: Secondary | ICD-10-CM

## 2014-11-27 DIAGNOSIS — Z950 Presence of cardiac pacemaker: Secondary | ICD-10-CM

## 2014-11-27 NOTE — Progress Notes (Signed)
Remote pacemaker transmission.   

## 2014-11-27 NOTE — Telephone Encounter (Signed)
Confirmed remote transmission w/ pt wife.   

## 2014-11-28 LAB — MDC_IDC_ENUM_SESS_TYPE_REMOTE
Battery Remaining Longevity: 85 mo
Battery Voltage: 2.77 V
Brady Statistic AP VP Percent: 40 %
Brady Statistic AS VP Percent: 60 %
Date Time Interrogation Session: 20160314184722
Lead Channel Pacing Threshold Amplitude: 0.75 V
Lead Channel Pacing Threshold Amplitude: 0.875 V
Lead Channel Pacing Threshold Pulse Width: 0.4 ms
Lead Channel Pacing Threshold Pulse Width: 0.4 ms
Lead Channel Setting Pacing Amplitude: 2 V
Lead Channel Setting Pacing Amplitude: 2.5 V
Lead Channel Setting Pacing Pulse Width: 0.4 ms
Lead Channel Setting Sensing Sensitivity: 1 mV
MDC IDC MSMT BATTERY IMPEDANCE: 298 Ohm
MDC IDC MSMT LEADCHNL RA IMPEDANCE VALUE: 446 Ohm
MDC IDC MSMT LEADCHNL RA SENSING INTR AMPL: 1 mV
MDC IDC MSMT LEADCHNL RV IMPEDANCE VALUE: 461 Ohm
MDC IDC STAT BRADY AP VS PERCENT: 0 %
MDC IDC STAT BRADY AS VS PERCENT: 0 %

## 2014-11-30 ENCOUNTER — Encounter: Payer: Self-pay | Admitting: Cardiology

## 2014-12-06 ENCOUNTER — Encounter: Payer: Self-pay | Admitting: Internal Medicine

## 2014-12-11 ENCOUNTER — Encounter: Payer: Self-pay | Admitting: Cardiology

## 2014-12-11 ENCOUNTER — Ambulatory Visit (INDEPENDENT_AMBULATORY_CARE_PROVIDER_SITE_OTHER): Payer: Medicare Other | Admitting: Cardiology

## 2014-12-11 VITALS — BP 128/70 | HR 76 | Ht 64.0 in | Wt 161.9 lb

## 2014-12-11 DIAGNOSIS — I2510 Atherosclerotic heart disease of native coronary artery without angina pectoris: Secondary | ICD-10-CM

## 2014-12-11 DIAGNOSIS — I350 Nonrheumatic aortic (valve) stenosis: Secondary | ICD-10-CM

## 2014-12-11 DIAGNOSIS — I441 Atrioventricular block, second degree: Secondary | ICD-10-CM | POA: Diagnosis not present

## 2014-12-11 NOTE — Progress Notes (Signed)
Jordan [name] Date of Birth: 06-09-1918   History of Present Illness: Jordan [name] is seen today for follow up CAD and pacemaker. He is now 81 years old. He has a history of coronary disease with remote stenting of the LAD in 1999. Cardiac catheterization January 2013 showed nonobstructive disease. He has a history of bradycardia requiring pacemaker implant. He has a history of moderate aortic stenosis by Echo in 2013. On followup today he is feeling very well. He denies any chest pain or shortness of breath. He remains active and plays golf at least once a week.  He has a 4.3 cm abdominal aortic aneurysm followed  by Dr. Kellie Simmering. CT in October was unchanged. He was seen by pulmonary previously for pulmonary nodule but this was felt to be unchanged from 5 years ago. He is followed by Dr. Karsten Ro for a renal tumor that has been stable. Labs are followed by Dr. Philip Aspen.  Current Outpatient Prescriptions on File Prior to Visit  Medication Sig Dispense Refill  . acetaminophen (TYLENOL) 325 MG tablet Take 650 mg by mouth every 6 (six) hours as needed. For fever    . amLODipine (NORVASC) 5 MG tablet Take 1 tablet (5 mg total) by mouth daily. 30 tablet 5  . clopidogrel (PLAVIX) 75 MG tablet Take 75 mg by mouth daily.      . ferrous sulfate 325 (65 FE) MG tablet Take 325 mg by mouth daily with breakfast.     . Multiple Vitamins-Minerals (ICAPS) TABS Take 1 tablet by mouth 2 (two) times daily.    Marland Kitchen NEXIUM 40 MG capsule TAKE 1 CAPSULE BY MOUTH EVERY DAY 30 capsule 6  . rosuvastatin (CRESTOR) 5 MG tablet TAKE 1 TABLET BY MOUTH EVERY DAY 30 tablet 11  . silodosin (RAPAFLO) 4 MG CAPS capsule Take 8 mg by mouth daily with breakfast.     No current facility-administered medications on file prior to visit.    Allergies  Allergen Reactions  . Aspirin Other (See Comments)    Stomach bleeds    Past Medical History  Diagnosis Date  . CAD (coronary artery disease)     Remote stent to LAD in 1999. Last  cath in 2004 and he is managed medically  . Anemia   . Hyperlipidemia   . Pacemaker     due to bradycardia; placed in 2004  . Aortic stenosis   . BPH (benign prostatic hyperplasia)   . HTN (hypertension)   . Colon polyps   . Blood transfusion   . GERD (gastroesophageal reflux disease)   . Hemorrhoid   . Complication of anesthesia   . Dysrhythmia     paced  . Arthritis 09/18/11    "in my knees"  . Basal cell carcinoma of skin   . AAA (abdominal aortic aneurysm)   . Lung nodule     RLL  . Renal mass, left     Past Surgical History  Procedure Laterality Date  . Nasal hemorrhage control  1980's  . Insert / replace / remove pacemaker  2004    initial placement; [employer]  . Insert / replace / remove pacemaker  02/14/2011  . Cardiac catheterization  2004    Managed medically  . Coronary angioplasty with stent placement  05/09/1998    "1"; LAD  . Tonsillectomy      "when I was a teenager"  . Skin cancer excision  09/18/11    "have had a couple hundred of them removed since I was  in my 15's"  . Cardiac catheterization    . Coronary angiogram  09/19/2011    Procedure: CORONARY ANGIOGRAM;  Surgeon: Josue Hector, MD;  Location: [facility] CATH LAB;  Service: Cardiovascular;;    History  Smoking status  . Former Smoker -- 1.00 packs/day for 42 years  . Types: Cigarettes  . Quit date: 09/15/1973  Smokeless tobacco  . Former Systems developer  . Types: Chew    History  Alcohol Use No    Family History  Problem Relation Age of Onset  . Cancer Father     stomach  . Colon cancer Father   . Heart disease Brother   . Heart disease Brother   . Diabetes Other     granddaughter    Review of Systems: As noted in history of present illness. All other systems were reviewed and are negative.  Physical Exam: BP 128/70 mmHg  Pulse 76  Ht 5\' 4"  (1.626 m)  Wt 161 lb 14.4 oz (73.437 kg)  BMI 27.78 kg/m2 Patient is very pleasant and in no acute distress. He looks younger than his stated age. Skin is  warm and dry. Color is normal.  HEENT is unremarkable. Normocephalic/atraumatic. PERRL. Sclera are nonicteric. Neck is supple. No masses. No JVD. Lungs are clear. Cardiac exam shows a regular rate and rhythm. He has a harsh 3/6 systolic outflow murmur. No diastolic murmur. Abdomen is soft. Extremities reveal trace left ankle edema.  Gait and ROM are intact. No gross neurologic deficits noted.   LABORATORY DATA: Ecg today shows NSR with atrial sensing and ventricular pacing. Rate 79 bpm. I have personally reviewed and interpreted this study.   Assessment / Plan: 1. Coronary disease with remote stenting of the LAD in 1999. Cardiac catheterization Jan 2013 showed nonobstructive disease. He remains asymptomatic. We will continue with aspirin and statin therapy.  2. Moderate aortic stenosis. Last echocardiogram in February 2013. Prominent murmur.He remains asymptomatic. At his age I don't think routine Echo is beneficial. I would only order if he is symptomatic.   3. Sinus node dysfunction with bradycardia. Status post pacemaker implant. Pacemaker followup 2 weeks ago was satisfactory. Follow up in pacer clinic.  4. Hyperlipidemia. On chronic Crestor therapy. Lab work followed by his primary.   5. AAA 4.3 cm. Followed by Dr. Kellie Simmering.  6. RLL pulmonary nodule. Chronic  7. Renal mass. Followed by urology.  I will follow up in 6 months.

## 2014-12-11 NOTE — Patient Instructions (Signed)
Continue your current therapy  I will see you in 6 months.   

## 2014-12-14 ENCOUNTER — Encounter: Payer: Self-pay | Admitting: Cardiology

## 2014-12-14 ENCOUNTER — Telehealth: Payer: Self-pay

## 2014-12-14 NOTE — Telephone Encounter (Signed)
12/14/14 Disc Received from [facility] and filed on shelf.Britt Bottom

## 2014-12-18 ENCOUNTER — Other Ambulatory Visit: Payer: Self-pay | Admitting: *Deleted

## 2014-12-18 MED ORDER — ROSUVASTATIN CALCIUM 5 MG PO TABS
ORAL_TABLET | ORAL | Status: DC
Start: 2014-12-18 — End: 2015-04-20

## 2014-12-18 MED ORDER — AMLODIPINE BESYLATE 5 MG PO TABS: 5.0000 mg | ORAL_TABLET | Freq: Every day | ORAL | Status: DC

## 2015-02-01 ENCOUNTER — Telehealth: Payer: Self-pay | Admitting: Cardiology

## 2015-02-01 NOTE — Telephone Encounter (Signed)
Returned call to patient he stated wife needed refill for Alprazolam.Refill called to pharmacy.

## 2015-02-01 NOTE — Telephone Encounter (Signed)
Returned call to patient no answer.LMTC. 

## 2015-02-01 NOTE — Telephone Encounter (Signed)
Returning your call. °

## 2015-02-21 ENCOUNTER — Other Ambulatory Visit: Payer: Self-pay | Admitting: Dermatology

## 2015-02-26 ENCOUNTER — Ambulatory Visit (INDEPENDENT_AMBULATORY_CARE_PROVIDER_SITE_OTHER): Payer: Medicare Other | Admitting: *Deleted

## 2015-02-26 DIAGNOSIS — I441 Atrioventricular block, second degree: Secondary | ICD-10-CM

## 2015-02-26 NOTE — Progress Notes (Signed)
Remote pacemaker transmission.   

## 2015-02-28 LAB — CUP PACEART REMOTE DEVICE CHECK
Battery Impedance: 372 Ohm
Brady Statistic AP VS Percent: 0 %
Brady Statistic AS VS Percent: 0 %
Lead Channel Pacing Threshold Amplitude: 0.625 V
Lead Channel Pacing Threshold Amplitude: 0.75 V
Lead Channel Pacing Threshold Pulse Width: 0.4 ms
Lead Channel Pacing Threshold Pulse Width: 0.4 ms
Lead Channel Sensing Intrinsic Amplitude: 0.7 mV
Lead Channel Setting Pacing Amplitude: 2.5 V
Lead Channel Setting Pacing Pulse Width: 0.4 ms
Lead Channel Setting Sensing Sensitivity: 1 mV
MDC IDC MSMT BATTERY REMAINING LONGEVITY: 80 mo
MDC IDC MSMT BATTERY VOLTAGE: 2.77 V
MDC IDC MSMT LEADCHNL RA IMPEDANCE VALUE: 440 Ohm
MDC IDC MSMT LEADCHNL RV IMPEDANCE VALUE: 458 Ohm
MDC IDC SESS DTM: 20160613125131
MDC IDC SET LEADCHNL RA PACING AMPLITUDE: 2 V
MDC IDC STAT BRADY AP VP PERCENT: 40 %
MDC IDC STAT BRADY AS VP PERCENT: 60 %

## 2015-03-05 ENCOUNTER — Encounter: Payer: Self-pay | Admitting: Cardiology

## 2015-03-07 ENCOUNTER — Encounter: Payer: Self-pay | Admitting: Internal Medicine

## 2015-03-09 ENCOUNTER — Emergency Department (HOSPITAL_COMMUNITY): Payer: Medicare Other

## 2015-03-09 ENCOUNTER — Encounter (HOSPITAL_COMMUNITY): Payer: Self-pay | Admitting: Emergency Medicine

## 2015-03-09 ENCOUNTER — Observation Stay (HOSPITAL_COMMUNITY)
Admission: EM | Admit: 2015-03-09 | Discharge: 2015-03-12 | Disposition: A | Discharge status: Home / Self Care | Payer: Medicare Other | Attending: Family Medicine | Admitting: Family Medicine

## 2015-03-09 DIAGNOSIS — E785 Hyperlipidemia, unspecified: Secondary | ICD-10-CM | POA: Diagnosis not present

## 2015-03-09 DIAGNOSIS — R0789 Other chest pain: Secondary | ICD-10-CM | POA: Diagnosis not present

## 2015-03-09 DIAGNOSIS — R001 Bradycardia, unspecified: Secondary | ICD-10-CM | POA: Insufficient documentation

## 2015-03-09 DIAGNOSIS — I10 Essential (primary) hypertension: Secondary | ICD-10-CM | POA: Insufficient documentation

## 2015-03-09 DIAGNOSIS — R079 Chest pain, unspecified: Secondary | ICD-10-CM

## 2015-03-09 DIAGNOSIS — I714 Abdominal aortic aneurysm, without rupture: Secondary | ICD-10-CM | POA: Insufficient documentation

## 2015-03-09 DIAGNOSIS — Z950 Presence of cardiac pacemaker: Secondary | ICD-10-CM | POA: Insufficient documentation

## 2015-03-09 DIAGNOSIS — I350 Nonrheumatic aortic (valve) stenosis: Secondary | ICD-10-CM | POA: Diagnosis not present

## 2015-03-09 DIAGNOSIS — N400 Enlarged prostate without lower urinary tract symptoms: Secondary | ICD-10-CM | POA: Insufficient documentation

## 2015-03-09 DIAGNOSIS — I441 Atrioventricular block, second degree: Secondary | ICD-10-CM | POA: Diagnosis not present

## 2015-03-09 DIAGNOSIS — M13861 Other specified arthritis, right knee: Secondary | ICD-10-CM | POA: Diagnosis not present

## 2015-03-09 DIAGNOSIS — I080 Rheumatic disorders of both mitral and aortic valves: Secondary | ICD-10-CM | POA: Insufficient documentation

## 2015-03-09 DIAGNOSIS — M13862 Other specified arthritis, left knee: Secondary | ICD-10-CM | POA: Diagnosis not present

## 2015-03-09 DIAGNOSIS — K219 Gastro-esophageal reflux disease without esophagitis: Secondary | ICD-10-CM | POA: Insufficient documentation

## 2015-03-09 DIAGNOSIS — Z955 Presence of coronary angioplasty implant and graft: Secondary | ICD-10-CM | POA: Diagnosis not present

## 2015-03-09 DIAGNOSIS — Z85828 Personal history of other malignant neoplasm of skin: Secondary | ICD-10-CM | POA: Diagnosis not present

## 2015-03-09 DIAGNOSIS — Z886 Allergy status to analgesic agent status: Secondary | ICD-10-CM | POA: Diagnosis not present

## 2015-03-09 DIAGNOSIS — Z86010 Personal history of colonic polyps: Secondary | ICD-10-CM | POA: Insufficient documentation

## 2015-03-09 DIAGNOSIS — Z87891 Personal history of nicotine dependence: Secondary | ICD-10-CM | POA: Insufficient documentation

## 2015-03-09 DIAGNOSIS — I2510 Atherosclerotic heart disease of native coronary artery without angina pectoris: Secondary | ICD-10-CM | POA: Diagnosis not present

## 2015-03-09 DIAGNOSIS — I208 Other forms of angina pectoris: Secondary | ICD-10-CM

## 2015-03-09 LAB — TROPONIN I

## 2015-03-09 LAB — BASIC METABOLIC PANEL
ANION GAP: 8 (ref 5–15)
BUN: 28 mg/dL — ABNORMAL HIGH (ref 6–20)
CALCIUM: 8.7 mg/dL — AB (ref 8.9–10.3)
CO2: 27 mmol/L (ref 22–32)
Chloride: 101 mmol/L (ref 101–111)
Creatinine, Ser: 1.2 mg/dL (ref 0.61–1.24)
GFR calc Af Amer: 58 mL/min — ABNORMAL LOW (ref >60)
GFR calc non Af Amer: 50 mL/min — ABNORMAL LOW (ref >60)
GLUCOSE: 140 mg/dL — AB (ref 65–99)
POTASSIUM: 4.2 mmol/L (ref 3.5–5.1)
SODIUM: 136 mmol/L (ref 135–145)

## 2015-03-09 LAB — CBC
HCT: 28.9 % — ABNORMAL LOW (ref 39.0–52.0)
HEMOGLOBIN: 9.3 g/dL — AB (ref 13.0–17.0)
MCH: 30.5 pg (ref 26.0–34.0)
MCHC: 32.2 g/dL (ref 30.0–36.0)
MCV: 94.8 fL (ref 78.0–100.0)
Platelets: 215 10*3/uL (ref 150–400)
RBC: 3.05 MIL/uL — AB (ref 4.22–5.81)
RDW: 13.3 % (ref 11.5–15.5)
WBC: 8.7 10*3/uL (ref 4.0–10.5)

## 2015-03-09 LAB — I-STAT TROPONIN, ED: Troponin i, poc: 0 ng/mL (ref 0.00–0.08)

## 2015-03-09 LAB — BRAIN NATRIURETIC PEPTIDE: B Natriuretic Peptide: 86.6 pg/mL (ref 0.0–100.0)

## 2015-03-09 MED ORDER — GI COCKTAIL ~~LOC~~: 30.0000 mL | Freq: Four times a day (QID) | ORAL | Status: DC | PRN

## 2015-03-09 MED ORDER — MORPHINE SULFATE 2 MG/ML IJ SOLN: 2.0000 mg | INTRAMUSCULAR | Status: DC | PRN

## 2015-03-09 MED ORDER — TAMSULOSIN HCL 0.4 MG PO CAPS
0.4000 mg | ORAL_CAPSULE | Freq: Every day | ORAL | Status: DC
  Administered 2015-03-09: 0.4 mg via ORAL
  Filled 2015-03-09 (×2): qty 1

## 2015-03-09 MED ORDER — FERROUS SULFATE 325 (65 FE) MG PO TABS
325.0000 mg | ORAL_TABLET | Freq: Every day | ORAL | Status: DC
  Filled 2015-03-09: qty 1

## 2015-03-09 MED ORDER — CLOPIDOGREL BISULFATE 75 MG PO TABS
75.0000 mg | ORAL_TABLET | Freq: Every day | ORAL | Status: DC
  Administered 2015-03-10 – 2015-03-11 (×2): 75 mg via ORAL
  Filled 2015-03-09 (×2): qty 1

## 2015-03-09 MED ORDER — HEPARIN SODIUM (PORCINE) 5000 UNIT/ML IJ SOLN
5000.0000 [IU] | Freq: Three times a day (TID) | INTRAMUSCULAR | Status: DC
  Administered 2015-03-09 – 2015-03-12 (×9): 5000 [IU] via SUBCUTANEOUS
  Filled 2015-03-09 (×9): qty 1

## 2015-03-09 MED ORDER — PANTOPRAZOLE SODIUM 40 MG PO TBEC
40.0000 mg | DELAYED_RELEASE_TABLET | Freq: Every day | ORAL | Status: DC
  Administered 2015-03-09: 40 mg via ORAL
  Filled 2015-03-09 (×2): qty 1

## 2015-03-09 MED ORDER — ACETAMINOPHEN 325 MG PO TABS: 650.0000 mg | ORAL_TABLET | ORAL | Status: DC | PRN

## 2015-03-09 MED ORDER — ROSUVASTATIN CALCIUM 5 MG PO TABS
5.0000 mg | ORAL_TABLET | Freq: Every day | ORAL | Status: DC
  Administered 2015-03-09: 5 mg via ORAL
  Filled 2015-03-09 (×2): qty 1

## 2015-03-09 MED ORDER — AMLODIPINE BESYLATE 5 MG PO TABS
5.0000 mg | ORAL_TABLET | Freq: Every day | ORAL | Status: DC
  Administered 2015-03-10 – 2015-03-11 (×2): 5 mg via ORAL
  Filled 2015-03-09 (×2): qty 1

## 2015-03-09 MED ORDER — ONDANSETRON HCL 4 MG/2ML IJ SOLN: 4.0000 mg | Freq: Four times a day (QID) | INTRAMUSCULAR | Status: DC | PRN

## 2015-03-09 NOTE — ED Provider Notes (Signed)
CSN: [id number]     Arrival date & time 03/09/15  1556 History   First MD Initiated Contact with Patient 03/09/15 1601     Chief Complaint  Patient presents with  . Chest Pain  . Dysphagia  . Near Syncope     (Consider location/radiation/quality/duration/timing/severity/associated sxs/prior Treatment) Patient is a 81 y.o. male presenting with chest pain and near-syncope. The history is provided by the patient.  Chest Pain Pain location:  Substernal area Pain quality: pressure   Pain quality: not sharp   Radiates to: throat. Pain radiates to the back: no   Pain severity:  Moderate Onset quality:  Gradual Timing:  Constant Progression:  Unchanged Chronicity:  New Context comment:  With exertion Relieved by:  Nothing Worsened by:  Nothing tried Associated symptoms: near-syncope   Associated symptoms: no abdominal pain, no back pain, no cough, no fever, no shortness of breath and not vomiting   Near Syncope Associated symptoms include chest pain. Pertinent negatives include no abdominal pain and no shortness of breath.    Past Medical History  Diagnosis Date  . CAD (coronary artery disease)     Remote stent to LAD in 1999. Last cath in 2004 and he is managed medically  . Anemia   . Hyperlipidemia   . Pacemaker     due to bradycardia; placed in 2004  . Aortic stenosis   . BPH (benign prostatic hyperplasia)   . HTN (hypertension)   . Colon polyps   . Blood transfusion   . GERD (gastroesophageal reflux disease)   . Hemorrhoid   . Complication of anesthesia   . Dysrhythmia     paced  . Arthritis 09/18/11    "in my knees"  . Basal cell carcinoma of skin   . AAA (abdominal aortic aneurysm)   . Lung nodule     RLL  . Renal mass, left    Past Surgical History  Procedure Laterality Date  . Nasal hemorrhage control  1980's  . Insert / replace / remove pacemaker  2004    initial placement; [employer]  . Insert / replace / remove pacemaker  02/14/2011  . Cardiac  catheterization  2004    Managed medically  . Coronary angioplasty with stent placement  05/09/1998    "1"; LAD  . Tonsillectomy      "when I was a teenager"  . Skin cancer excision  09/18/11    "have had a couple hundred of them removed since I was in my 35's"  . Cardiac catheterization    . Coronary angiogram  09/19/2011    Procedure: CORONARY ANGIOGRAM;  Surgeon: Josue Hector, MD;  Location: [facility] CATH LAB;  Service: Cardiovascular;;   Family History  Problem Relation Age of Onset  . Cancer Father     stomach  . Colon cancer Father   . Heart disease Brother   . Heart disease Brother   . Diabetes Other     granddaughter   History  Substance Use Topics  . Smoking status: Former Smoker -- 1.00 packs/day for 42 years    Types: Cigarettes    Quit date: 09/15/1973  . Smokeless tobacco: Former Systems developer    Types: Chew  . Alcohol Use: No    Review of Systems  Constitutional: Negative for fever.  Respiratory: Negative for cough and shortness of breath.   Cardiovascular: Positive for chest pain and near-syncope. Negative for leg swelling.  Gastrointestinal: Negative for vomiting and abdominal pain.  Musculoskeletal: Negative for back pain.  All other systems reviewed and are negative.     Allergies  Aspirin  Home Medications   Prior to Admission medications   Medication Sig Start Date End Date Taking? Authorizing Provider  acetaminophen (TYLENOL) 325 MG tablet Take 650 mg by mouth every 6 (six) hours as needed. For fever    Historical Provider, MD  amLODipine (NORVASC) 5 MG tablet Take 1 tablet (5 mg total) by mouth daily. 12/18/14   Peter M Martinique, MD  clopidogrel (PLAVIX) 75 MG tablet Take 75 mg by mouth daily.      Historical Provider, MD  ferrous sulfate 325 (65 FE) MG tablet Take 325 mg by mouth daily with breakfast.     Historical Provider, MD  Multiple Vitamins-Minerals (ICAPS) TABS Take 1 tablet by mouth 2 (two) times daily.    Historical Provider, MD  NEXIUM 40 MG capsule  TAKE 1 CAPSULE BY MOUTH EVERY DAY 05/14/13   Peter M Martinique, MD  rosuvastatin (CRESTOR) 5 MG tablet TAKE 1 TABLET BY MOUTH EVERY DAY 12/18/14   Peter M Martinique, MD  silodosin (RAPAFLO) 4 MG CAPS capsule Take 8 mg by mouth daily with breakfast.    Historical Provider, MD   BP 129/65 mmHg  Pulse 78  Resp 20  SpO2 98% Physical Exam  Constitutional: He is oriented to person, place, and time. He appears well-developed and well-nourished. No distress.  HENT:  Head: Normocephalic and atraumatic.  Mouth/Throat: Oropharynx is clear and moist. No oropharyngeal exudate.  Eyes: EOM are normal. Pupils are equal, round, and reactive to light.  Neck: Normal range of motion. Neck supple.  Cardiovascular: Normal rate and regular rhythm.  Exam reveals no friction rub.   No murmur heard. Pulmonary/Chest: Effort normal and breath sounds normal. No respiratory distress. He has no wheezes. He has no rales.  Abdominal: Soft. He exhibits no distension. There is no tenderness. There is no rebound.  Musculoskeletal: Normal range of motion. He exhibits no edema.  Neurological: He is alert and oriented to person, place, and time. No cranial nerve deficit. He exhibits normal muscle tone. Coordination normal.  Skin: Skin is warm. No rash noted. He is not diaphoretic.  Nursing note and vitals reviewed.   ED Course  Procedures (including critical care time) Labs Review Labs Reviewed  [place], ED    Imaging Review Dg Chest Port 1 View  03/09/2015   CLINICAL DATA:  Chest tightness with difficulty breathing for several months  EXAM: PORTABLE CHEST - 1 VIEW  COMPARISON:  01/19/2014  FINDINGS: Cardiac shadow is mildly enlarged. A pacing device is again seen and stable. Diffuse interstitial changes are noted throughout both lungs. Mild superimposed vascular congestion is noted as well. No sizable effusion or infiltrate is noted.  IMPRESSION: Vascular  congestion superimposed over more chronic interstitial changes. No focal infiltrate is seen.   Electronically Signed   By: Inez Catalina M.D.   On: 03/09/2015 17:09     EKG Interpretation   Date/Time:  Friday March 09 2015 16:04:36 EDT Ventricular Rate:  80 PR Interval:  198 QRS Duration: 166 QT Interval:  445 QTC Calculation: 513 R Axis:   -60 Text Interpretation:  Atrial-sensed ventricular-paced rhythm No further  analysis attempted due to paced rhythm Pacer new Confirmed by Mingo Amber  MD,  Leon ([id number]) on 03/09/2015 4:38:29 PM      MDM   Final diagnoses:  Chest pain    57M here with chest  pressure with exertion. History of this for the past month, worst today. He has been playing golf regular for the past month but has had to stop due to worsening chest pressure with exertion. He's having pressure today that radiated to his throat. This started when he is playing in trash cans. Denies any fever, nausea vomiting, shortness of breath. Here vitals are stable. EKG with pacemaker similar to prior. Does have history of stents. Will look at his troponins and likely admit for chest pain rule out. Initial troponin and BNP ok. Admitted by Dr. Wendee Beavers.   Evelina Bucy, MD 03/10/15 [phone]

## 2015-03-09 NOTE — H&P (Signed)
[facility] History and Physical  Jordan [name] NTI:[id number] DOB: Aug 05, 1918 DOA: 03/09/2015  Referring physician: Dr. Mingo Amber PCP: Donnajean Lopes, MD   Chief Complaint: Chest discomfort  HPI: Jordan [name] is a 81 y.o. male  With history of hypertension and coronary artery disease status post LAD stenting in 1999 as well as pacemaker. Patient presents with one-month complaint of chest tightness located at the upper portion of his chest. He also states that it has been associated with difficulty swallowing pills. He denies a traveling to his left arm or jaw. The tightness he states is worse with activity but can manifest itself while he is at rest. It is described as a tightness feeling. The discomfort comes on intermittently. Given persistence and worsening of symptoms patient presented to the ED for further evaluation recommendations.   Review of Systems:  Constitutional:  No weight loss, night sweats, Fevers, chills, fatigue.  HEENT:  No headaches, Difficulty swallowing,Tooth/dental problems,Sore throat,  No sneezing, itching, ear ache, nasal congestion, post nasal drip,  Cardio-vascular:  + chest pain, Orthopnea, PND, swelling in lower extremities, anasarca, dizziness, palpitations  GI:  No heartburn, indigestion, abdominal pain, nausea, vomiting, diarrhea, change in bowel habits, loss of appetite  Resp:  No shortness of breath with exertion or at rest. No excess mucus, no productive cough, No non-productive cough, No coughing up of blood.No change in color of mucus.No wheezing.No chest wall deformity  Skin:  no rash or lesions.  GU:  no dysuria, change in color of urine, no urgency or frequency. No flank pain.  Musculoskeletal:  No joint pain or swelling. No decreased range of motion. No back pain.  Psych:  No change in mood or affect. No depression or anxiety. No memory loss.   Past Medical History  Diagnosis Date  . CAD (coronary artery disease)    Remote stent to LAD in 1999. Last cath in 2004 and he is managed medically  . Anemia   . Hyperlipidemia   . Pacemaker     due to bradycardia; placed in 2004  . Aortic stenosis   . BPH (benign prostatic hyperplasia)   . HTN (hypertension)   . Colon polyps   . Blood transfusion   . GERD (gastroesophageal reflux disease)   . Hemorrhoid   . Complication of anesthesia   . Dysrhythmia     paced  . Arthritis 09/18/11    "in my knees"  . Basal cell carcinoma of skin   . AAA (abdominal aortic aneurysm)   . Lung nodule     RLL  . Renal mass, left    Past Surgical History  Procedure Laterality Date  . Nasal hemorrhage control  1980's  . Insert / replace / remove pacemaker  2004    initial placement; [employer]  . Insert / replace / remove pacemaker  02/14/2011  . Cardiac catheterization  2004    Managed medically  . Coronary angioplasty with stent placement  05/09/1998    "1"; LAD  . Tonsillectomy      "when I was a teenager"  . Skin cancer excision  09/18/11    "have had a couple hundred of them removed since I was in my 21's"  . Cardiac catheterization    . Coronary angiogram  09/19/2011    Procedure: CORONARY ANGIOGRAM;  Surgeon: Josue Hector, MD;  Location: [facility] CATH LAB;  Service: Cardiovascular;;   Social History:  reports that he quit smoking about 41 years ago. His smoking use included Cigarettes.  He has a 42 pack-year smoking history. He has quit using smokeless tobacco. His smokeless tobacco use included Chew. He reports that he does not drink alcohol or use illicit drugs.  Allergies  Allergen Reactions  . Aspirin Other (See Comments)    Stomach bleeds    Family History  Problem Relation Age of Onset  . Cancer Father     stomach  . Colon cancer Father   . Heart disease Brother   . Heart disease Brother   . Diabetes Other     granddaughter    Prior to Admission medications   Medication Sig Start Date End Date Taking? Authorizing Provider  acetaminophen (TYLENOL) 325  MG tablet Take 650 mg by mouth every 6 (six) hours as needed for mild pain, moderate pain or fever.    Yes Historical Provider, MD  amLODipine (NORVASC) 5 MG tablet Take 1 tablet (5 mg total) by mouth daily. 12/18/14  Yes Peter M Martinique, MD  clopidogrel (PLAVIX) 75 MG tablet Take 75 mg by mouth daily.     Yes Historical Provider, MD  ferrous sulfate 325 (65 FE) MG tablet Take 325 mg by mouth daily with breakfast.    Yes Historical Provider, MD  Multiple Vitamins-Minerals (ICAPS) TABS Take 1 tablet by mouth 2 (two) times daily.   Yes Historical Provider, MD  NEXIUM 40 MG capsule TAKE 1 CAPSULE BY MOUTH EVERY DAY Patient taking differently: TAKE 40 MG BY MOUTH DAILY. 05/14/13  Yes Peter M Martinique, MD  rosuvastatin (CRESTOR) 5 MG tablet TAKE 1 TABLET BY MOUTH EVERY DAY Patient taking differently: Take 5 mg by mouth daily.  12/18/14  Yes Peter M Martinique, MD  silodosin (RAPAFLO) 4 MG CAPS capsule Take 4 mg by mouth daily with breakfast.    Yes Historical Provider, MD   Physical Exam: Filed Vitals:   03/09/15 1607  BP: 129/65  Pulse: 78  Resp: 20  SpO2: 98%    Wt Readings from Last 3 Encounters:  12/11/14 73.437 kg (161 lb 14.4 oz)  08/01/14 73.936 kg (163 lb)  05/23/14 73.211 kg (161 lb 6.4 oz)    General:  Appears calm and comfortable Eyes: PERRL, normal lids, irises & conjunctiva ENT: grossly normal hearing, lips & tongue, dry mucous membranes Neck: no LAD, masses or thyromegaly Cardiovascular: RRR, positive systolic murmur. Respiratory: CTA bilaterally, no w/r/r. Normal respiratory effort. Abdomen: soft, nt, nd Skin: no rash or induration seen on limited exam Musculoskeletal: grossly normal tone BUE/BLE Psychiatric: grossly normal mood and affect, speech fluent and appropriate Neurologic: Patient answers questions appropriately, no facial asymmetry, moves extremities equally           Labs on Admission:  Basic Metabolic Panel:  Recent Labs Lab 03/09/15 1628  NA 136  K 4.2  CL  101  CO2 27  GLUCOSE 140*  BUN 28*  CREATININE 1.20  CALCIUM 8.7*   Liver Function Tests: No results for input(s): AST, ALT, ALKPHOS, BILITOT, PROT, ALBUMIN in the last 168 hours. No results for input(s): LIPASE, AMYLASE in the last 168 hours. No results for input(s): AMMONIA in the last 168 hours. CBC:  Recent Labs Lab 03/09/15 1628  WBC 8.7  HGB 9.3*  HCT 28.9*  MCV 94.8  PLT 215   Cardiac Enzymes: No results for input(s): CKTOTAL, CKMB, CKMBINDEX, TROPONINI in the last 168 hours.  BNP (last 3 results)  Recent Labs  03/09/15 1628  BNP 86.6    ProBNP (last 3 results) No results for input(s): PROBNP  in the last 8760 hours.  CBG: No results for input(s): GLUCAP in the last 168 hours.  Radiological Exams on Admission: Dg Chest Port 1 View  03/09/2015   CLINICAL DATA:  Chest tightness with difficulty breathing for several months  EXAM: PORTABLE CHEST - 1 VIEW  COMPARISON:  01/19/2014  FINDINGS: Cardiac shadow is mildly enlarged. A pacing device is again seen and stable. Diffuse interstitial changes are noted throughout both lungs. Mild superimposed vascular congestion is noted as well. No sizable effusion or infiltrate is noted.  IMPRESSION: Vascular congestion superimposed over more chronic interstitial changes. No focal infiltrate is seen.   Electronically Signed   By: Inez Catalina M.D.   On: 03/09/2015 17:09    EKG: Independently reviewed. Paced rhythm with artifact  Assessment/Plan Active Problems:   Chest pain - Chest pain order set placed please review for details -Given history would consider cardiology consult next a.m. see if any further workup is recommended besides troponins and echocardiogram. -Some of the story is suspicious for anginal type discomfort as such agree with monitoring on telemetry and obtaining troponins.  Code Status: Full code DVT Prophylaxis: heparin Family Communication: Discussed with patient and family members at  bedside Disposition Plan: Pending further workup and investigational work  Time spent: > 45 minutes  Velvet Bathe [facility] Pager [phone]

## 2015-03-09 NOTE — ED Notes (Signed)
Pt sts that he has had tightness in his throat and chest with some difficulty taking a deep breath that has been going off/ on for months. Pt denies N/V. Pt is A&O and in NAD

## 2015-03-09 NOTE — Progress Notes (Signed)
Lab notified RN that scheduled troponin at 1730 was not drawn due to transfer from ED and miscommunication. Lab to come ASAP to draw troponin late and then to reschedule troponin per 6 hour schedule. Will continue to monitor pt and carry out plan of care. Carnella [place] I

## 2015-03-10 ENCOUNTER — Observation Stay (HOSPITAL_COMMUNITY): Payer: Medicare Other

## 2015-03-10 DIAGNOSIS — Z950 Presence of cardiac pacemaker: Secondary | ICD-10-CM

## 2015-03-10 DIAGNOSIS — I441 Atrioventricular block, second degree: Secondary | ICD-10-CM

## 2015-03-10 DIAGNOSIS — R079 Chest pain, unspecified: Secondary | ICD-10-CM | POA: Diagnosis not present

## 2015-03-10 DIAGNOSIS — I350 Nonrheumatic aortic (valve) stenosis: Secondary | ICD-10-CM | POA: Diagnosis not present

## 2015-03-10 DIAGNOSIS — I2510 Atherosclerotic heart disease of native coronary artery without angina pectoris: Secondary | ICD-10-CM | POA: Diagnosis not present

## 2015-03-10 LAB — TROPONIN I
Troponin I: 0.03 ng/mL (ref <0.031)
Troponin I: <0.03 ng/mL (ref <0.031)

## 2015-03-10 LAB — MRSA PCR SCREENING: MRSA by PCR: POSITIVE — AB

## 2015-03-10 MED ORDER — MUPIROCIN 2 % EX OINT
1.0000 "application " | TOPICAL_OINTMENT | Freq: Two times a day (BID) | CUTANEOUS | Status: DC
  Administered 2015-03-11 – 2015-03-12 (×4): 1 via NASAL
  Filled 2015-03-10: qty 22

## 2015-03-10 MED ORDER — TAMSULOSIN HCL 0.4 MG PO CAPS
0.4000 mg | ORAL_CAPSULE | Freq: Every day | ORAL | Status: DC
  Administered 2015-03-10 – 2015-03-11 (×2): 0.4 mg via ORAL
  Filled 2015-03-10 (×2): qty 1

## 2015-03-10 MED ORDER — CHLORHEXIDINE GLUCONATE CLOTH 2 % EX PADS
6.0000 | MEDICATED_PAD | Freq: Every day | CUTANEOUS | Status: DC
  Administered 2015-03-11 – 2015-03-12 (×2): 6 via TOPICAL

## 2015-03-10 MED ORDER — ROSUVASTATIN CALCIUM 5 MG PO TABS
5.0000 mg | ORAL_TABLET | Freq: Every day | ORAL | Status: DC
  Administered 2015-03-10 – 2015-03-11 (×2): 5 mg via ORAL
  Filled 2015-03-10 (×2): qty 1

## 2015-03-10 MED ORDER — PANTOPRAZOLE SODIUM 40 MG PO TBEC
40.0000 mg | DELAYED_RELEASE_TABLET | ORAL | Status: DC
  Administered 2015-03-10 – 2015-03-11 (×2): 40 mg via ORAL
  Filled 2015-03-10 (×2): qty 1

## 2015-03-10 MED ORDER — FERROUS SULFATE 325 (65 FE) MG PO TABS
325.0000 mg | ORAL_TABLET | Freq: Every day | ORAL | Status: DC
  Administered 2015-03-10 – 2015-03-11 (×2): 325 mg via ORAL
  Filled 2015-03-10 (×2): qty 1

## 2015-03-10 NOTE — Consult Note (Signed)
Admit date: 03/09/2015 Referring Physician  Dr. Wendee Beavers Primary Physician  Dr. Leanna Battles Primary Cardiologist : Dr. Martinique Reason for Consultation  Chest pain  HPI: Jordan [name] is a 81 y.o. male with history of hypertension, moderate AS and coronary artery disease status post LAD stenting in 1999 as well as pacemaker. Patient presents with 6 week complaint of chest tightness located at the upper portion of his chest. He describes it as a feeling like you get when it is hard to swallow pills except it occurs with exertion. He denies radiation to his left arm or jaw. The tightness he states is worse with activity but can manifest itself while he is at rest. It is described as a tightness feeling. The discomfort comes on intermittently. He used to play golf once weekly but over the past 6 weeks has he had to stop because of exertional chest discomfort.   Given persistence and worsening of symptoms patient presented to the ED for further evaluation recommendations.  Cardiac enzymes are negative x 3.  BNP was normal at 86.  EKG showed A sensed V paced rhythm.  2D echo today showed normal LVF with  Severe AS.  Mean AV gradient 41mmHg and AVA 0.74cm2 (echo 2013 with mean AVG 42mmHg).  Cardiology is now asked to consult.  He has been active up until recently playing golf once weekly.  He has a known 4.3cm AAA followed by Dr. Kellie Simmering.       PMH:   Past Medical History  Diagnosis Date  . CAD (coronary artery disease)     Remote stent to LAD in 1999. Last cath in 2004 and he is managed medically  . Anemia   . Hyperlipidemia   . Pacemaker     due to bradycardia; placed in 2004  . Aortic stenosis   . BPH (benign prostatic hyperplasia)   . HTN (hypertension)   . Colon polyps   . Blood transfusion   . GERD (gastroesophageal reflux disease)   . Hemorrhoid   . Complication of anesthesia   . Dysrhythmia     paced  . Arthritis 09/18/11    "in my knees"  . Basal cell carcinoma of skin   . AAA  (abdominal aortic aneurysm)   . Lung nodule     RLL  . Renal mass, left      PSH:   Past Surgical History  Procedure Laterality Date  . Nasal hemorrhage control  1980's  . Insert / replace / remove pacemaker  2004    initial placement; [employer]  . Insert / replace / remove pacemaker  02/14/2011  . Cardiac catheterization  2004    Managed medically  . Coronary angioplasty with stent placement  05/09/1998    "1"; LAD  . Tonsillectomy      "when I was a teenager"  . Skin cancer excision  09/18/11    "have had a couple hundred of them removed since I was in my 54's"  . Cardiac catheterization    . Coronary angiogram  09/19/2011    Procedure: CORONARY ANGIOGRAM;  Surgeon: Josue Hector, MD;  Location: [facility] CATH LAB;  Service: Cardiovascular;;    Allergies:  Aspirin Prior to Admit Meds:   Prescriptions prior to admission  Medication Sig Dispense Refill Last Dose  . acetaminophen (TYLENOL) 325 MG tablet Take 650 mg by mouth every 6 (six) hours as needed for mild pain, moderate pain or fever.    Past Week at Unknown time  .  amLODipine (NORVASC) 5 MG tablet Take 1 tablet (5 mg total) by mouth daily. 30 tablet 5 03/09/2015 at 0700  . clopidogrel (PLAVIX) 75 MG tablet Take 75 mg by mouth daily.     03/09/2015 at 0700  . ferrous sulfate 325 (65 FE) MG tablet Take 325 mg by mouth daily with breakfast.    03/09/2015 at 0700  . Multiple Vitamins-Minerals (ICAPS) TABS Take 1 tablet by mouth 2 (two) times daily.   03/09/2015 at 0700  . NEXIUM 40 MG capsule TAKE 1 CAPSULE BY MOUTH EVERY DAY (Patient taking differently: TAKE 40 MG BY MOUTH DAILY.) 30 capsule 6 03/08/2015 at 1600  . rosuvastatin (CRESTOR) 5 MG tablet TAKE 1 TABLET BY MOUTH EVERY DAY (Patient taking differently: Take 5 mg by mouth daily. ) 30 tablet 10 03/08/2015 at 1800  . silodosin (RAPAFLO) 4 MG CAPS capsule Take 4 mg by mouth daily with breakfast.    03/08/2015 at 2000   Fam HX:    Family History  Problem Relation Age of Onset  . Cancer  Father     stomach  . Colon cancer Father   . Heart disease Brother   . Heart disease Brother   . Diabetes Other     granddaughter   Social HX:    History   Social History  . Marital Status: Married    Spouse Name: N/A  . Number of Children: 2  . Years of Education: N/A   Occupational History  . Retired     Building control surveyor   Social History Main Topics  . Smoking status: Former Smoker -- 1.00 packs/day for 42 years    Types: Cigarettes    Quit date: 09/15/1973  . Smokeless tobacco: Former Systems developer    Types: Chew  . Alcohol Use: No  . Drug Use: No  . Sexual Activity: Yes   Other Topics Concern  . Not on file   Social History Narrative     ROS:  All 11 ROS were addressed and are negative except what is stated in the HPI  Physical Exam: Blood pressure 125/63, pulse 75, temperature 98 F (36.7 C), temperature source Oral, resp. rate 18, height 5\' 4"  (1.626 m), weight 159 lb 9.6 oz (72.394 kg), SpO2 97 %.    General: Well developed, well nourished, in no acute distress Head: Eyes PERRLA, No xanthomas.   Normal cephalic and atramatic  Lungs:   Clear bilaterally to auscultation and percussion. Heart:   HRRR  Pulses are 2+ & equal. S1 and S2 are not audible.  Harsh 3/6 late peaking systolic murmur at RUSB to LLSB and into carotids, No JVD.  No abdominal bruits. No femoral bruits. Abdomen: Bowel sounds are positive, abdomen soft and non-tender without masses Extremities:   No clubbing, cyanosis or edema.  DP +1 Neuro: Alert and oriented X 3. Psych:  Good affect, responds appropriately    Labs:   Lab Results  Component Value Date   WBC 8.7 03/09/2015   HGB 9.3* 03/09/2015   HCT 28.9* 03/09/2015   MCV 94.8 03/09/2015   PLT 215 03/09/2015    Recent Labs Lab 03/09/15 1628  NA 136  K 4.2  CL 101  CO2 27  BUN 28*  CREATININE 1.20  CALCIUM 8.7*  GLUCOSE 140*   No results found for: PTT Lab Results  Component Value Date   INR 1.10 09/18/2011   INR 1.1* 02/05/2011    INR 1.1 02/20/2009   Lab Results  Component Value Date  CKTOTAL 57 09/18/2011   CKMB 2.3 09/18/2011   TROPONINI <0.03 03/10/2015     Lab Results  Component Value Date   CHOL 104 09/19/2011   Lab Results  Component Value Date   HDL 38* 09/19/2011   Lab Results  Component Value Date   LDLCALC 53 09/19/2011   Lab Results  Component Value Date   TRIG 66 09/19/2011   Lab Results  Component Value Date   CHOLHDL 2.7 09/19/2011   No results found for: LDLDIRECT    Radiology:  Dg Chest Port 1 View  03/09/2015   CLINICAL DATA:  Chest tightness with difficulty breathing for several months  EXAM: PORTABLE CHEST - 1 VIEW  COMPARISON:  01/19/2014  FINDINGS: Cardiac shadow is mildly enlarged. A pacing device is again seen and stable. Diffuse interstitial changes are noted throughout both lungs. Mild superimposed vascular congestion is noted as well. No sizable effusion or infiltrate is noted.  IMPRESSION: Vascular congestion superimposed over more chronic interstitial changes. No focal infiltrate is seen.   Electronically Signed   By: Inez Catalina M.D.   On: 03/09/2015 17:09    EKG:  A sensed V paced rhythm  ASSESSMENT/PLAN: 1.  Chest pain occurring with exertion and at rest.  Cardiac enzymes are negative x 3 and EKG shows paced rhythm.  This is most likely due to progression of AS which is now severe.  He has a history of moderate AS at time of last cath 2013 showing nonobstructive disease.  It was felt that CP at that time was due to AS.  He was deemed not to be  a surgical candidate for median sternotomy AVR.  He has been quite active up until 6 weeks ago.  He had been playing golf weekly but since then has gotten chest discomfort when ever he plays.  Now he is getting the chest discomfort with minimal activity.  I have discussed the case with Dr. Angelena Form regarding whether he would be a TAVR candidate.  Despite his advanced age, he was quite active up until recently.  I have tried to get  in contact with his primary Cardiologist, Dr. Martinique, but he is on vacation.  The patient and his family will discuss their options which include consideration for TAVR or  continue to treat medically and consider Palliative Care Consult.  I recommend that if they would want to consider TAVR, that we get a formal consult from CVTS and TAVR Cardiologist before pursuing right and left heart cath.  Avoid nitrates in setting of severe AS. 2.  ASCAD with remote stent in 1999.  His last cath was in 2013 for CP, showing 20% distal LM, 30% prox/40% mid LAD, 80% ostial diag, normal LCX and 30% prox RCA.  Diagonal lesion was not amenable to PCI.  CP felt secondary to  AS and he has been medically managed since then.  Continue Plavix/statin therapy.  No nitrates due to severe AS.   3.  Severe Aortic stenosis - this has progressed since echo 2013 and he was deemed in past not to be a candidate for open AVR given advanced age. 4.  Bradycardia s/p dual chamber PPM 5.  HTN - controlled on amlodipine 6.  GERD 7.  Dyslipidemia 8.  History of syncope    Sueanne Margarita, MD  03/10/2015  4:32 PM

## 2015-03-10 NOTE — Progress Notes (Signed)
  Echocardiogram 2D Echocardiogram has been performed.  Lysle Rubens 03/10/2015, 9:02 AM

## 2015-03-10 NOTE — Progress Notes (Signed)
[facility] PROGRESS NOTE  Jordan [name] OEV:[id number] DOB: 1918-05-21 DOA: 03/09/2015 PCP: Donnajean Lopes, MD  Assessment/Plan: Active Problems:   Chest pain - And context of patient with history of CAD with prior stenting in 1999 to the LAD reporting recent chest tightness and decreased activity. - No red flags reported on telemetry - Troponins 3 negative - Consulted cardiology for further disposition recommendations given history  CAD - Continue Plavix and statin   Code Status: Full Family Communication: Discussed with patient and son at bedside Disposition Plan: Pending further recommendations from cardiologist   Consultants:  Cardiology  Procedures:  None  Antibiotics:  None  HPI/Subjective: Patient has no new complaints. No acute issues reported overnight. Denies any chest pain currently  Objective: Filed Vitals:   03/10/15 1328  BP: 125/63  Pulse: 75  Temp: 98 F (36.7 C)  Resp: 18    Intake/Output Summary (Last 24 hours) at 03/10/15 1616 Last data filed at 03/10/15 1602  Gross per 24 hour  Intake    480 ml  Output   1575 ml  Net  -1095 ml   Filed Weights   03/09/15 1901  Weight: 72.394 kg (159 lb 9.6 oz)    Exam:   General:  Patient in no acute distress, alert and awake  Cardiovascular: Regular rate and rhythm, no rubs, positive systolic murmur  Respiratory: No increased work of breathing, no wheezes, equal chest rise  Abdomen: Soft, nondistended, nontender  Musculoskeletal: No cyanosis or clubbing on limited exam   Data Reviewed: Basic Metabolic Panel:  Recent Labs Lab 03/09/15 1628  NA 136  K 4.2  CL 101  CO2 27  GLUCOSE 140*  BUN 28*  CREATININE 1.20  CALCIUM 8.7*   Liver Function Tests: No results for input(s): AST, ALT, ALKPHOS, BILITOT, PROT, ALBUMIN in the last 168 hours. No results for input(s): LIPASE, AMYLASE in the last 168 hours. No results for input(s): AMMONIA in the last 168  hours. CBC:  Recent Labs Lab 03/09/15 1628  WBC 8.7  HGB 9.3*  HCT 28.9*  MCV 94.8  PLT 215   Cardiac Enzymes:  Recent Labs Lab 03/09/15 1955 03/10/15 0145 03/10/15 0822  TROPONINI <0.03 0.03 <0.03   BNP (last 3 results)  Recent Labs  03/09/15 1628  BNP 86.6    ProBNP (last 3 results) No results for input(s): PROBNP in the last 8760 hours.  CBG: No results for input(s): GLUCAP in the last 168 hours.  No results found for this or any previous visit (from the past 240 hour(s)).   Studies: Dg Chest Port 1 View  03/09/2015   CLINICAL DATA:  Chest tightness with difficulty breathing for several months  EXAM: PORTABLE CHEST - 1 VIEW  COMPARISON:  01/19/2014  FINDINGS: Cardiac shadow is mildly enlarged. A pacing device is again seen and stable. Diffuse interstitial changes are noted throughout both lungs. Mild superimposed vascular congestion is noted as well. No sizable effusion or infiltrate is noted.  IMPRESSION: Vascular congestion superimposed over more chronic interstitial changes. No focal infiltrate is seen.   Electronically Signed   By: Inez Catalina M.D.   On: 03/09/2015 17:09    Scheduled Meds: . amLODipine  5 mg Oral Daily  . clopidogrel  75 mg Oral Daily  . ferrous sulfate  325 mg Oral Q2000  . heparin  5,000 Units Subcutaneous 3 times per day  . pantoprazole  40 mg Oral Q24H  . rosuvastatin  5 mg Oral q1800  . tamsulosin  0.4 mg Oral Q2000   Continuous Infusions:   Time spent: > 25 minutes    Velvet Bathe  [facility] Pager [phone] If 7PM-7AM, please contact night-coverage at [email or web address], password [facility] 03/10/2015, 4:16 PM

## 2015-03-10 NOTE — Progress Notes (Signed)
CRITICAL VALUE ALERT  Critical value received:  MRSA pcr +  Date of notification:  03/10/2015  Time of notification:  2330  Critical value read back:Yes.    Nurse who received alert:  Carnella Guadalajara I  MD notified (1st page):  N/a  Time of first page:  n/a  Standing orders for positive MRSA pcr placed by RN.

## 2015-03-11 DIAGNOSIS — R079 Chest pain, unspecified: Secondary | ICD-10-CM | POA: Diagnosis not present

## 2015-03-11 DIAGNOSIS — I10 Essential (primary) hypertension: Secondary | ICD-10-CM | POA: Diagnosis not present

## 2015-03-11 DIAGNOSIS — I350 Nonrheumatic aortic (valve) stenosis: Secondary | ICD-10-CM | POA: Diagnosis not present

## 2015-03-11 MED ORDER — CLOPIDOGREL BISULFATE 75 MG PO TABS
75.0000 mg | ORAL_TABLET | Freq: Every day | ORAL | Status: DC
  Administered 2015-03-12: 75 mg via ORAL
  Filled 2015-03-11: qty 1

## 2015-03-11 MED ORDER — AMLODIPINE BESYLATE 5 MG PO TABS
5.0000 mg | ORAL_TABLET | Freq: Every day | ORAL | Status: DC
  Administered 2015-03-12: 5 mg via ORAL
  Filled 2015-03-11: qty 1

## 2015-03-11 NOTE — Progress Notes (Signed)
[facility] PROGRESS NOTE  Jordan [name] IEP:[id number] DOB: 06-10-1918 DOA: 03/09/2015 PCP: Donnajean Lopes, MD  Assessment/Plan: Active Problems:   Chest pain - No red flags reported on telemetry - Troponins 3 negative - Awaiting final recommendations from Cardiology. Current symptoms are thought to be secondary to worsening AS.  CAD - Continue Plavix and statin   Code Status: Full Family Communication: Discussed with patient and son at bedside Disposition Plan: Pending further recommendations from cardiologist   Consultants:  Cardiology  Procedures:  None  Antibiotics:  None  HPI/Subjective: Patient has no new complaints. No acute issues reported overnight from patient.  Objective: Filed Vitals:   03/11/15 0626  BP: 122/70  Pulse: 80  Temp: 98 F (36.7 C)  Resp: 16    Intake/Output Summary (Last 24 hours) at 03/11/15 1505 Last data filed at 03/11/15 6606  Gross per 24 hour  Intake    120 ml  Output   1225 ml  Net  -1105 ml   Filed Weights   03/09/15 1901  Weight: 72.394 kg (159 lb 9.6 oz)    Exam:   General:  Patient in no acute distress, alert and awake  Cardiovascular: Regular rate and rhythm, no rubs, positive systolic murmur  Respiratory: No increased work of breathing, no wheezes, equal chest rise  Abdomen: Soft, nondistended, nontender  Musculoskeletal: No cyanosis or clubbing on limited exam   Data Reviewed: Basic Metabolic Panel:  Recent Labs Lab 03/09/15 1628  NA 136  K 4.2  CL 101  CO2 27  GLUCOSE 140*  BUN 28*  CREATININE 1.20  CALCIUM 8.7*   Liver Function Tests: No results for input(s): AST, ALT, ALKPHOS, BILITOT, PROT, ALBUMIN in the last 168 hours. No results for input(s): LIPASE, AMYLASE in the last 168 hours. No results for input(s): AMMONIA in the last 168 hours. CBC:  Recent Labs Lab 03/09/15 1628  WBC 8.7  HGB 9.3*  HCT 28.9*  MCV 94.8  PLT 215   Cardiac Enzymes:  Recent  Labs Lab 03/09/15 1955 03/10/15 0145 03/10/15 0822  TROPONINI <0.03 0.03 <0.03   BNP (last 3 results)  Recent Labs  03/09/15 1628  BNP 86.6    ProBNP (last 3 results) No results for input(s): PROBNP in the last 8760 hours.  CBG: No results for input(s): GLUCAP in the last 168 hours.  Recent Results (from the past 240 hour(s))  MRSA PCR Screening     Status: Abnormal   Collection Time: 03/10/15  8:43 PM  Result Value Ref Range Status   MRSA by PCR POSITIVE (A) NEGATIVE Final    Comment:        The GeneXpert MRSA Assay (FDA approved for NASAL specimens only), is one component of a comprehensive MRSA colonization surveillance program. It is not intended to diagnose MRSA infection nor to guide or monitor treatment for MRSA infections. RESULT CALLED TO, READ BACK BY AND VERIFIED WITH: T SASRO RN [id number] 03/10/15 A NAVARRO      Studies: Dg Chest Port 1 View  03/09/2015   CLINICAL DATA:  Chest tightness with difficulty breathing for several months  EXAM: PORTABLE CHEST - 1 VIEW  COMPARISON:  01/19/2014  FINDINGS: Cardiac shadow is mildly enlarged. A pacing device is again seen and stable. Diffuse interstitial changes are noted throughout both lungs. Mild superimposed vascular congestion is noted as well. No sizable effusion or infiltrate is noted.  IMPRESSION: Vascular congestion superimposed over more chronic interstitial changes. No focal infiltrate is seen.  Electronically Signed   By: Inez Catalina M.D.   On: 03/09/2015 17:09    Scheduled Meds: . [START ON 03/12/2015] amLODipine  5 mg Oral QAC breakfast  . Chlorhexidine Gluconate Cloth  6 each Topical Q0600  . [START ON 03/12/2015] clopidogrel  75 mg Oral QAC breakfast  . ferrous sulfate  325 mg Oral Q2000  . heparin  5,000 Units Subcutaneous 3 times per day  . mupirocin ointment  1 application Nasal BID  . pantoprazole  40 mg Oral Q24H  . rosuvastatin  5 mg Oral q1800  . tamsulosin  0.4 mg Oral Q2000   Continuous  Infusions:   Time spent: > 25 minutes    Velvet Bathe  [facility] Pager [phone] If 7PM-7AM, please contact night-coverage at [email or web address], password [facility] 03/11/2015, 3:05 PM

## 2015-03-11 NOTE — Progress Notes (Signed)
SUBJECTIVE: The patient is doing well today.  No symptoms at rest.  At this time, he denies chest pain, shortness of breath, or any new concerns.  Marland Kitchen amLODipine  5 mg Oral Daily  . Chlorhexidine Gluconate Cloth  6 each Topical Q0600  . clopidogrel  75 mg Oral Daily  . ferrous sulfate  325 mg Oral Q2000  . heparin  5,000 Units Subcutaneous 3 times per day  . mupirocin ointment  1 application Nasal BID  . pantoprazole  40 mg Oral Q24H  . rosuvastatin  5 mg Oral q1800  . tamsulosin  0.4 mg Oral Q2000      OBJECTIVE: Physical Exam: Filed Vitals:   03/10/15 1328 03/10/15 1852 03/10/15 2033 03/11/15 0626  BP: 125/63 126/56 156/70 122/70  Pulse: 75 76 84 80  Temp: 98 F (36.7 C) 98.2 F (36.8 C) 98.2 F (36.8 C) 98 F (36.7 C)  TempSrc: Oral Oral Oral Oral  Resp: 18 16 18 16   Height:      Weight:      SpO2: 97% 96% 96% 95%    Intake/Output Summary (Last 24 hours) at 03/11/15 0825 Last data filed at 03/11/15 4098  Gross per 24 hour  Intake    360 ml  Output   1875 ml  Net  -1515 ml    Telemetry reveals sinus rhythm with V pacing  GEN- The patient is elderly appearing, alert and oriented x 3 today.   Head- normocephalic, atraumatic Eyes-  Sclera clear, conjunctiva pink Ears- hearing intact Oropharynx- clear Neck- supple,  Lungs- Clear to ausculation bilaterally, normal work of breathing Heart- Regular rate and rhythm, 3/6 SEM LUSB which is late peaking, cannot hear A2 GI- soft, NT, ND, + BS Extremities- no clubbing, cyanosis, or edema Skin- no rash or lesion Psych- euthymic mood, full affect Neuro- strength and sensation are intact  LABS: Basic Metabolic Panel:  Recent Labs  03/09/15 1628  NA 136  K 4.2  CL 101  CO2 27  GLUCOSE 140*  BUN 28*  CREATININE 1.20  CALCIUM 8.7*   Liver Function Tests: No results for input(s): AST, ALT, ALKPHOS, BILITOT, PROT, ALBUMIN in the last 72 hours. No results for input(s): LIPASE, AMYLASE in the last 72  hours. CBC:  Recent Labs  03/09/15 1628  WBC 8.7  HGB 9.3*  HCT 28.9*  MCV 94.8  PLT 215   Cardiac Enzymes:  Recent Labs  03/09/15 1955 03/10/15 0145 03/10/15 0822  TROPONINI <0.03 0.03 <0.03    ASSESSMENT AND PLAN:   1. Severe AS, exertional symptoms  I agree with Dr Radford Pax that progressive AS is the most likely cause for his symptoms.   Last cath 2013 showing nonobstructive disease. It was felt that CP at that time was due to AS. He is not a candidate for AVR. Dr Radford Pax did discuss TAVR with patient and his family.  The patient says that he was told that "without this procedure (he) would likely die within a year".  The family's expectation is for TAVR this week.  I had a long discussion with patient and family regarding the practicality of TAVR and also the limitations presented by his advanced age.  I would personally recommend a more palliative/ medicine route for him.  The patient and his family are not ready to consider this option stating that he has previously been quite active.  They would like to discuss this further with Dr Martinique.  I have offered that the rounding doctor  tomorrow (Monday) will try to discuss with Dr Martinique.  In addition, formal TAVR consultation with either Dr Angelena Form or Burt Knack should be considered.  This may have to be done more electively in the outpatient setting.  AS BP is mostly controlled, I am not sure that there is any roll for additional medical therapy at this junction. Keep in the hospital for further discussions in am as above.  2. ASCAD with remote stent in 1999. His last cath was in 2013 for CP, showing 20% distal LM, 30% prox/40% mid LAD, 80% ostial diag, normal LCX and 30% prox RCA. Diagonal lesion was not amenable to PCI. CP felt secondary to AS and he has been medically managed since then. Continue Plavix/statin therapy. No nitrates due to severe AS.   3. HTN - controlled on amlodipine  4. GERD On PPI   Thompson Grayer, MD 03/11/2015 8:25 AM

## 2015-03-12 DIAGNOSIS — I350 Nonrheumatic aortic (valve) stenosis: Secondary | ICD-10-CM | POA: Diagnosis not present

## 2015-03-12 DIAGNOSIS — I441 Atrioventricular block, second degree: Secondary | ICD-10-CM | POA: Diagnosis not present

## 2015-03-12 NOTE — Progress Notes (Signed)
Patient discharged home with family, discharge instructions given and explained to patient/son and they verbalized understanding, patient denies any pain/distress, no wound noted, accompanied home by son.

## 2015-03-12 NOTE — Telephone Encounter (Signed)
Received a call from patient's son Louie Casa he stated father was admitted to [facility] this past Friday for chest tightness.Stated he was told father's valve was causing his problems.Stated he would like Dr.Jordan's advice.Dr.Jordan will be in office this afternoon, will speak to him and call you back.

## 2015-03-12 NOTE — Progress Notes (Signed)
Patient Name: Jordan [name] Date of Encounter: 03/12/2015  Primary Cardiologist: Dr. Martinique   Active Problems:   Coronary atherosclerosis   Cardiac pacemaker in situ   Aortic stenosis   Mobitz type II atrioventricular block   Chest pain    SUBJECTIVE  Denies any CP or SOB since arrival. Does have DOE. Had some throat discomfort last Friday while lifting trash can  CURRENT MEDS . amLODipine  5 mg Oral QAC breakfast  . Chlorhexidine Gluconate Cloth  6 each Topical Q0600  . clopidogrel  75 mg Oral QAC breakfast  . ferrous sulfate  325 mg Oral Q2000  . heparin  5,000 Units Subcutaneous 3 times per day  . mupirocin ointment  1 application Nasal BID  . pantoprazole  40 mg Oral Q24H  . rosuvastatin  5 mg Oral q1800  . tamsulosin  0.4 mg Oral Q2000    OBJECTIVE  Filed Vitals:   03/11/15 1500 03/11/15 2101 03/12/15 0527 03/12/15 0835  BP: 123/53 142/65 102/54 100/69  Pulse: 72 86 87   Temp: 97.8 F (36.6 C) 99.1 F (37.3 C) 99.2 F (37.3 C)   TempSrc: Oral Oral Oral   Resp: 18 16 20    Height:      Weight:      SpO2: 98% 95% 94%     Intake/Output Summary (Last 24 hours) at 03/12/15 0847 Last data filed at 03/12/15 0424  Gross per 24 hour  Intake    600 ml  Output    975 ml  Net   -375 ml   Filed Weights   03/09/15 1901  Weight: 159 lb 9.6 oz (72.394 kg)    PHYSICAL EXAM  General: Pleasant, NAD. Neuro: Alert and oriented X 3. Moves all extremities spontaneously. Psych: Normal affect. HEENT:  Normal  Neck: Supple without bruits or JVD. Lungs:  Resp regular and unlabored, CTA. Heart: RRR no s3, s4. 3/6 systolic murmur at R upper sternal border Abdomen: Soft, non-tender, non-distended, BS + x 4.  Extremities: No clubbing, cyanosis or edema. DP/PT/Radials 2+ and equal bilaterally.  Accessory Clinical Findings  CBC  Recent Labs  03/09/15 1628  WBC 8.7  HGB 9.3*  HCT 28.9*  MCV 94.8  PLT 416   Basic Metabolic Panel  Recent Labs   03/09/15 1628  NA 136  K 4.2  CL 101  CO2 27  GLUCOSE 140*  BUN 28*  CREATININE 1.20  CALCIUM 8.7*   Cardiac Enzymes  Recent Labs  03/09/15 1955 03/10/15 0145 03/10/15 0822  TROPONINI <0.03 0.03 <0.03    TELE Paced rhythm with HR 70s    ECG  No new EKG  Echocardiogram  LV EF: 60% -  65%  ------------------------------------------------------------------- Indications:   Chest pain 786.51.  ------------------------------------------------------------------- History:  PMH:  Coronary artery disease. Aortic valve disease. Risk factors: Dyslipidemia.  ------------------------------------------------------------------- Study Conclusions  - Left ventricle: The cavity size was normal. Wall thickness was increased in a pattern of mild LVH. Systolic function was normal. The estimated ejection fraction was in the range of 60% to 65%. Wall motion was normal; there were no regional wall motion abnormalities. Doppler parameters are consistent with abnormal left ventricular relaxation (grade 1 diastolic dysfunction). - Aortic valve: Moderately calcified annulus. Trileaflet; moderately thickened leaflets. There was severe stenosis. Mean gradient (S): 42 mm Hg. VTI ratio of LVOT to aortic valve: 0.26. Valve area (VTI): 0.74 cm^2. Valve area (Vmax): 0.77 cm^2. Valve area (Vmean): 0.66 cm^2. - Mitral valve: Moderately calcified annulus. Moderately  thickened leaflets . The findings are consistent with mild stenosis. There was mild regurgitation. Valve area by continuity equation (using LVOT flow): 2.14 cm^2. - Left atrium: The atrium was mildly dilated. - Technically difficult study.    Radiology/Studies  Dg Chest Port 1 View  03/09/2015   CLINICAL DATA:  Chest tightness with difficulty breathing for several months  EXAM: PORTABLE CHEST - 1 VIEW  COMPARISON:  01/19/2014  FINDINGS: Cardiac shadow is mildly enlarged. A pacing device is again  seen and stable. Diffuse interstitial changes are noted throughout both lungs. Mild superimposed vascular congestion is noted as well. No sizable effusion or infiltrate is noted.  IMPRESSION: Vascular congestion superimposed over more chronic interstitial changes. No focal infiltrate is seen.   Electronically Signed   By: Inez Catalina M.D.   On: 03/09/2015 17:09    ASSESSMENT AND PLAN  1. Exertional symptom related to severe AS  - Echo 03/10/2015 EF 14-97%, grade 1 diastolic dysfunction, no RWMA, severe AS with mean graient 55mmHg, mean valve area 0.66, moderately calcified mitral valve with mild stenosis and regurg.  - feels his symptom is likely result of severe AS  - Dr. Rayann Heman discussed with family regarding TAVR and palliative therapy, family is not ready to consider palliative therapy at this time given his previously good functional status, MD to discuss with Dr. Martinique. If family wish for, will arrange outpatient consult with Dr. Burt Knack and Dr. Angelena Form  - consider switch amlodipine to BB  2. ASCAD with remote stent in 1999. His last cath was in 2013 for CP, showing 20% distal LM, 30% prox/40% mid LAD, 80% ostial diag, normal LCX and 30% prox RCA. Diagonal lesion was not amenable to PCI.  - CP likely related to severe AS  3. HTN 4. GERD 5. Chronic normocytic anemia: hgb 8-10 6. Stage III CKD  Signed, Woodward Ku Pager: [phone]  History and all data above reviewed.  Patient examined.  I agree with the findings as above.  No chest/neck pain at rest.  No SOB or presyncope walking in the room.   The patient exam reveals COR:RRR, systolic murmur  ,  Lungs: Clear  ,  Abd: Positive bowel sounds, no rebound no guarding, Ext No edema  .  All available labs, radiology testing, previous records reviewed. Agree with documented assessment and plan. AS:  Symptoms likely related to increased AS.  I spoke with Dr. Martinique this morning.  I had a long conversation with the patient and his son.  Mr.  Hardcastle' symptoms are all with exertion.  He understands that he would need to have a consultation with the Structural Heart Disease Team before have a TAVR.  He will need cath and other studies.  He is high risk secondary to his advanced age and other comorbid conditions such as CKD.  However, they would like to proceed with this evaluation as an out patient.  Dr. Martinique suggests that he should consider this.  Otherwise OK to send home from our standpoint on meds as currently listed.  We will arrange the follow up appt.   Jeneen [facility]  10:42 AM  03/12/2015

## 2015-03-12 NOTE — Discharge Summary (Signed)
Physician Discharge Summary  Jordan [name] NWG:[id number] DOB: 09-26-1917 DOA: 03/09/2015  PCP: Donnajean Lopes, MD  Admit date: 03/09/2015 Discharge date: 03/12/2015  Time spent: > 35 minutes  Recommendations for Outpatient Follow-up:  1.   Discharge Diagnoses:  Active Problems:   Coronary atherosclerosis   Cardiac pacemaker in situ   Aortic stenosis   Mobitz type II atrioventricular block   Chest pain   Discharge Condition: stable  Diet recommendation: heart healthy diet  Filed Weights   03/09/15 1901  Weight: 72.394 kg (159 lb 9.6 oz)    History of present illness:  From original history of present illness 81 y.o. male  With history of hypertension and coronary artery disease status post LAD stenting in 1999 as well as pacemaker. Patient presents with one-month complaint of chest tightness located at the upper portion of his chest. He also states that it has been associated with difficulty swallowing pills. He denies traveling to his left arm or jaw.   Hospital Course:  Chest discomfort with decreased activity -At this point suspected to be secondary to worsening AS. Patient is to follow-up with cardiology as outpatient discuss treatment options. - Cardiology was consulted and assisted with medical decision making while in house.  Procedures:  Echocardiogram: Study Conclusions  - Left ventricle: The cavity size was normal. Wall thickness was increased in a pattern of mild LVH. Systolic function was normal. The estimated ejection fraction was in the range of 60% to 65%. Wall motion was normal; there were no regional wall motion abnormalities. Doppler parameters are consistent with abnormal left ventricular relaxation (grade 1 diastolic dysfunction). - Aortic valve: Moderately calcified annulus. Trileaflet; moderately thickened leaflets. There was severe stenosis. Mean gradient (S): 42 mm Hg. VTI ratio of LVOT to aortic valve: 0.26. Valve  area (VTI): 0.74 cm^2. Valve area (Vmax): 0.77 cm^2. Valve area (Vmean): 0.66 cm^2. - Mitral valve: Moderately calcified annulus. Moderately thickened leaflets . The findings are consistent with mild stenosis. There was mild regurgitation. Valve area by continuity equation (using LVOT flow): 2.14 cm^2. - Left atrium: The atrium was mildly dilated. - Technically difficult study.   Consultations:  Cardiology  Discharge Exam: Filed Vitals:   03/12/15 0835  BP: 100/69  Pulse:   Temp:   Resp:     General: Patient in no acute distress, alert and awake Cardiovascular: Regular rate and rhythm, positive systolic murmur Respiratory: No increased work of breathing, no audible wheezes, equal chest rise  Discharge Instructions   Discharge Instructions    Call MD for:  extreme fatigue    Complete by:  As directed      Call MD for:  severe uncontrolled pain    Complete by:  As directed      Call MD for:  temperature >100.4    Complete by:  As directed      Diet - low sodium heart healthy    Complete by:  As directed      Discharge instructions    Complete by:  As directed   Please f/u with Cardiology Dr. Martinique. As Recommended by your inpatient cardiology group.     Increase activity slowly    Complete by:  As directed           Current Discharge Medication List    CONTINUE these medications which have NOT CHANGED   Details  acetaminophen (TYLENOL) 325 MG tablet Take 650 mg by mouth every 6 (six) hours as needed for mild pain, moderate pain or fever.  amLODipine (NORVASC) 5 MG tablet Take 1 tablet (5 mg total) by mouth daily. Qty: 30 tablet, Refills: 5    clopidogrel (PLAVIX) 75 MG tablet Take 75 mg by mouth daily.      ferrous sulfate 325 (65 FE) MG tablet Take 325 mg by mouth daily with breakfast.     Multiple Vitamins-Minerals (ICAPS) TABS Take 1 tablet by mouth 2 (two) times daily.    NEXIUM 40 MG capsule TAKE 1 CAPSULE BY MOUTH EVERY DAY Qty: 30  capsule, Refills: 6    rosuvastatin (CRESTOR) 5 MG tablet TAKE 1 TABLET BY MOUTH EVERY DAY Qty: 30 tablet, Refills: 10    silodosin (RAPAFLO) 4 MG CAPS capsule Take 4 mg by mouth daily with breakfast.        Allergies  Allergen Reactions  . Aspirin Other (See Comments)    Stomach bleeds   Follow-up Information    Follow up with Sherren Mocha, MD.   Specialty:  Cardiology   Why:  Dr. Antionette Char nurse will contact you to arrange followup either with Dr. Burt Knack or Dr. Angelena Form of valvular clinic to discuss TAVR, please give [place] a call if you do not hear from [place] in 2 business days   Contact information:   [address] [place] [address] [phone]        The results of significant diagnostics from this hospitalization (including imaging, microbiology, ancillary and laboratory) are listed below for reference.    Significant Diagnostic Studies: Dg Chest Port 1 View  03/09/2015   CLINICAL DATA:  Chest tightness with difficulty breathing for several months  EXAM: PORTABLE CHEST - 1 VIEW  COMPARISON:  01/19/2014  FINDINGS: Cardiac shadow is mildly enlarged. A pacing device is again seen and stable. Diffuse interstitial changes are noted throughout both lungs. Mild superimposed vascular congestion is noted as well. No sizable effusion or infiltrate is noted.  IMPRESSION: Vascular congestion superimposed over more chronic interstitial changes. No focal infiltrate is seen.   Electronically Signed   By: Inez Catalina M.D.   On: 03/09/2015 17:09    Microbiology: Recent Results (from the past 240 hour(s))  MRSA PCR Screening     Status: Abnormal   Collection Time: 03/10/15  8:43 PM  Result Value Ref Range Status   MRSA by PCR POSITIVE (A) NEGATIVE Final    Comment:        The GeneXpert MRSA Assay (FDA approved for NASAL specimens only), is one component of a comprehensive MRSA colonization surveillance program. It is not intended to diagnose MRSA infection nor to  guide or monitor treatment for MRSA infections. RESULT CALLED TO, READ BACK BY AND VERIFIED WITH: T SASRO RN [id number] 03/10/15 A NAVARRO      Labs: Basic Metabolic Panel:  Recent Labs Lab 03/09/15 1628  NA 136  K 4.2  CL 101  CO2 27  GLUCOSE 140*  BUN 28*  CREATININE 1.20  CALCIUM 8.7*   Liver Function Tests: No results for input(s): AST, ALT, ALKPHOS, BILITOT, PROT, ALBUMIN in the last 168 hours. No results for input(s): LIPASE, AMYLASE in the last 168 hours. No results for input(s): AMMONIA in the last 168 hours. CBC:  Recent Labs Lab 03/09/15 1628  WBC 8.7  HGB 9.3*  HCT 28.9*  MCV 94.8  PLT 215   Cardiac Enzymes:  Recent Labs Lab 03/09/15 1955 03/10/15 0145 03/10/15 0822  TROPONINI <0.03 0.03 <0.03   BNP: BNP (last 3 results)  Recent Labs  03/09/15 1628  BNP 86.6    ProBNP (last 3 results) No results for input(s): PROBNP in the last 8760 hours.  CBG: No results for input(s): GLUCAP in the last 168 hours.   Signed:  Velvet Bathe  [facility] 03/12/2015, 11:18 AM

## 2015-03-13 ENCOUNTER — Telehealth: Payer: Self-pay | Admitting: Cardiology

## 2015-03-13 NOTE — Telephone Encounter (Signed)
I spoke with the pt's son and TAVR consult scheduled on 03/22/15 with Dr Angelena Form. Pt will arrive at 1:45 for a 2:00 appointment.

## 2015-03-13 NOTE — Telephone Encounter (Signed)
New message     Returning [name]'s call.  Please advise

## 2015-03-20 ENCOUNTER — Encounter: Payer: Self-pay | Admitting: Cardiology

## 2015-03-22 ENCOUNTER — Encounter: Payer: Self-pay | Admitting: Cardiovascular Disease

## 2015-03-22 ENCOUNTER — Encounter: Payer: Self-pay | Admitting: *Deleted

## 2015-03-22 ENCOUNTER — Ambulatory Visit (INDEPENDENT_AMBULATORY_CARE_PROVIDER_SITE_OTHER): Payer: Medicare Other | Admitting: Cardiovascular Disease

## 2015-03-22 VITALS — BP 120/60 | HR 81 | Ht 64.0 in | Wt 157.8 lb

## 2015-03-22 DIAGNOSIS — I441 Atrioventricular block, second degree: Secondary | ICD-10-CM

## 2015-03-22 DIAGNOSIS — I350 Nonrheumatic aortic (valve) stenosis: Secondary | ICD-10-CM | POA: Diagnosis not present

## 2015-03-22 DIAGNOSIS — I2510 Atherosclerotic heart disease of native coronary artery without angina pectoris: Secondary | ICD-10-CM | POA: Diagnosis not present

## 2015-03-22 NOTE — Progress Notes (Signed)
Chief Complaint  Patient presents with  . Chest Pain     History of Present Illness: 81 yo male with history of CAD, HLD, anemia, symptomatic bradycardia now s/p PPM placement, HTN, BPH, GERD and severe aortic valve stenosis who is referred to the valve clinic today for further discussion regarding his aortic valve stenosis. His cardiac issues are followed by Dr. Martinique. He has been known to have severe aortic valve stenosis for many years but has not been considered a candidate for traditional AVR given his advanced age. He was admitted to [facility] 03/09/15 with c/o chest pain. Troponin was negative. Echo was repeated and showed continued aortic stenosis with a mean gradient of 42 mm Hg and AVA of 0.66cm2 with normal LV systolic function. He has a history of CAD with remote stent placement in 1999 and last cath in January 2013 which showed mild to moderate disease in the major epicardial vessels with severe ostial diagonal stenosis not felt to be amenable to PCI. Permanent pacemaker was placed in 2004 for symptomatic bradycardia. This has been followed by Dr. Lovena Le with replacement in June 2012. He has a 4.3 cm abdominal aortic aneurysm followed by Dr. Kellie Simmering. CT in October was unchanged. He was seen by pulmonary previously for pulmonary nodule but this was felt to be unchanged from 5 years ago. He is followed by Dr. Karsten Ro for a renal tumor that has been stable.  He tells me today that he was very active up until 2 months ago. He has been experiencing chest pressure and dyspnea with minimal exertion. He has constant fatigue. He had been playing golf up until his symptoms worsened. He has had no LE edema, near syncope, syncope or dizziness. He is a retired Building control surveyor. He has been married for 62 years. He lives with his wife. He is here today with his son and daughter in law.   Primary Care Physician: Leanna Battles   Past Medical History  Diagnosis Date  . CAD (coronary artery disease)    Remote stent to LAD in 1999. Last cath in 2004 and he is managed medically  . Anemia   . Hyperlipidemia   . Pacemaker     due to bradycardia; placed in 2004  . Aortic stenosis   . BPH (benign prostatic hyperplasia)   . HTN (hypertension)   . Colon polyps   . Blood transfusion   . GERD (gastroesophageal reflux disease)   . Hemorrhoid   . Complication of anesthesia   . Dysrhythmia     paced  . Arthritis 09/18/11    "in my knees"  . Basal cell carcinoma of skin   . AAA (abdominal aortic aneurysm)   . Lung nodule     RLL  . Renal mass, left     Past Surgical History  Procedure Laterality Date  . Nasal hemorrhage control  1980's  . Insert / replace / remove pacemaker  2004    initial placement; [employer]  . Insert / replace / remove pacemaker  02/14/2011  . Cardiac catheterization  2004    Managed medically  . Coronary angioplasty with stent placement  05/09/1998    "1"; LAD  . Tonsillectomy      "when I was a teenager"  . Skin cancer excision  09/18/11    "have had a couple hundred of them removed since I was in my 75's"  . Cardiac catheterization    . Coronary angiogram  09/19/2011    Procedure: CORONARY ANGIOGRAM;  Surgeon: Josue Hector, MD;  Location: [facility] CATH LAB;  Service: Cardiovascular;;    Current Outpatient Prescriptions  Medication Sig Dispense Refill  . acetaminophen (TYLENOL) 325 MG tablet Take 650 mg by mouth every 6 (six) hours as needed for mild pain, moderate pain or fever.     Marland Kitchen amLODipine (NORVASC) 5 MG tablet Take 1 tablet (5 mg total) by mouth daily. 30 tablet 5  . clopidogrel (PLAVIX) 75 MG tablet Take 75 mg by mouth daily.      . ferrous sulfate 325 (65 FE) MG tablet Take 325 mg by mouth daily with breakfast.     . NEXIUM 40 MG capsule TAKE 1 CAPSULE BY MOUTH EVERY DAY (Patient taking differently: TAKE 40 MG BY MOUTH DAILY.) 30 capsule 6  . rosuvastatin (CRESTOR) 5 MG tablet TAKE 1 TABLET BY MOUTH EVERY DAY (Patient taking differently: Take 5 mg by mouth  daily. ) 30 tablet 10  . silodosin (RAPAFLO) 4 MG CAPS capsule Take 4 mg by mouth daily with breakfast.      No current facility-administered medications for this visit.    Allergies  Allergen Reactions  . Aspirin Other (See Comments)    Stomach bleeds    History   Social History  . Marital Status: Married    Spouse Name: N/A  . Number of Children: 2  . Years of Education: N/A   Occupational History  . Retired     Building control surveyor   Social History Main Topics  . Smoking status: Former Smoker -- 1.00 packs/day for 42 years    Types: Cigarettes    Quit date: 09/15/1973  . Smokeless tobacco: Former Systems developer    Types: Chew  . Alcohol Use: No  . Drug Use: No  . Sexual Activity: Yes   Other Topics Concern  . Not on file   Social History Narrative    Family History  Problem Relation Age of Onset  . Cancer Father     stomach  . Colon cancer Father   . Heart disease Brother   . Heart disease Brother   . Diabetes Other     granddaughter    Review of Systems:  As stated in the HPI and otherwise negative.   BP 120/60 mmHg  Pulse 81  Ht 5\' 4"  (1.626 m)  Wt 157 lb 12.8 oz (71.578 kg)  BMI 27.07 kg/m2  SpO2 95%  Physical Examination: General: Well developed, well nourished, NAD HEENT: OP clear, mucus membranes moist SKIN: warm, dry. No rashes. Neuro: No focal deficits Musculoskeletal: Muscle strength 5/5 all ext Psychiatric: Mood and affect normal Neck: No JVD, no carotid bruits, no thyromegaly, no lymphadenopathy. Lungs:Clear bilaterally, no wheezes, rhonci, crackles Cardiovascular: Regular rate and rhythm. Loud harsh systolic murmur. No gallops or rubs. Abdomen:Soft. Bowel sounds present. Non-tender.  Extremities: No lower extremity edema. Pulses are 2 + in the bilateral DP/PT.  Echo June 2016: Left ventricle: The cavity size was normal. Wall thickness was increased in a pattern of mild LVH. Systolic function was normal. The estimated ejection fraction was in the  range of 60% to 65%. Wall motion was normal; there were no regional wall motion abnormalities. Doppler parameters are consistent with abnormal left ventricular relaxation (grade 1 diastolic dysfunction). - Aortic valve: Moderately calcified annulus. Trileaflet; moderately thickened leaflets. There was severe stenosis. Mean gradient (S): 42 mm Hg. VTI ratio of LVOT to aortic valve: 0.26. Valve area (VTI): 0.74 cm^2. Valve area (Vmax): 0.77 cm^2. Valve area (Vmean): 0.66 cm^2. -  Mitral valve: Moderately calcified annulus. Moderately thickened leaflets . The findings are consistent with mild stenosis. There was mild regurgitation. Valve area by continuity equation (using LVOT flow): 2.14 cm^2. - Left atrium: The atrium was mildly dilated. - Technically difficult study.  Cardiac cath January 2013: LM: 20% distal LAD: Calcified. 30% proximal 40% mid Normal distal wraps apex IM: large and normal D1: 80% ostial calcified and small vessel D2: normal large vessel Circumflex: Normal OM1- normal OM2: Normal RCA: Dominant 30% proximal  EKG:  EKG is not ordered today. The ekg ordered today demonstrates   Recent Labs: 03/09/2015: B Natriuretic Peptide 86.6; BUN 28*; Creatinine, Ser 1.20; Hemoglobin 9.3*; Platelets 215; Potassium 4.2; Sodium 136   Lipid Panel    Component Value Date/Time   CHOL 104 09/19/2011 0135   TRIG 66 09/19/2011 0135   HDL 38* 09/19/2011 0135   CHOLHDL 2.7 09/19/2011 0135   VLDL 13 09/19/2011 0135   LDLCALC 53 09/19/2011 0135     Wt Readings from Last 3 Encounters:  03/22/15 157 lb 12.8 oz (71.578 kg)  03/09/15 159 lb 9.6 oz (72.394 kg)  12/11/14 161 lb 14.4 oz (73.437 kg)     Other studies Reviewed: Additional studies/ records that were reviewed today include: Echo images, cath report. Hospital records.  Review of the above records demonstrates:   STS Risk Score:  Risk  of Mortality: 6.838%  Morbidity or Mortality: 21.729%  Long Length of Stay: 9.292%   Assessment and Plan:   1. Severe aortic valve stenosis: He has class D symptomatic aortic stenosis with dyspnea and chest pressure with minimal exertion. He is a poor candidate for traditional AVR given his advanced age. His STS risk score suggests a 6.8% chance of mortality with traditional AVR (current coronary anatomy is unknown). I think that he would be a reasonable candidate for TAVR despite his advanced age given his overall functional level. He has known CAD that was moderate by last cath in 2013. I will arrange a right and left heart cath on 04/11/15 at [facility] to further assess his AS and exclude progression of CAD. Risks and benefits of cath reviewed with pt. He agrees to proceed. He interested in pursuing TAVR. Following his cath he will be referred to see Dr. Roxy Manns or Dr. Cyndia Bent in the [place] surgery office for a surgical opinion on his AS. I have spent 45 minutes today explaining the TAVR procedure and cardiac cath including risks of the procedure.   2. CAD: Stable. Will further assess with cardiac cath   3. High grade AV block (Mobitz II): PPM in place.    Current medicines are reviewed at length with the patient today.  The patient does not have concerns regarding medicines.  The following changes have been made:  no change  Labs/ tests ordered today include:  No orders of the defined types were placed in this encounter.    Disposition:   FU with me following the cath   Signed, Lauree Chandler, MD 03/22/2015 2:01 PM    [facility] [address], [place], [place]  [address] Phone: [phone]; Fax: [phone]

## 2015-03-22 NOTE — Patient Instructions (Signed)
Medication Instructions:  Your physician recommends that you continue on your current medications as directed. Please refer to the Current Medication list given to you today.   Labwork: Your physician recommends that you return for lab work on July 25,2016--BMP,CBC, PT  Testing/Procedures: Your physician has requested that you have a cardiac catheterization. Cardiac catheterization is used to diagnose and/or treat various heart conditions. Doctors may recommend this procedure for a number of different reasons. The most common reason is to evaluate chest pain. Chest pain can be a symptom of coronary artery disease (CAD), and cardiac catheterization can show whether plaque is narrowing or blocking your heart's arteries. This procedure is also used to evaluate the valves, as well as measure the blood flow and oxygen levels in different parts of your heart. For further information please visit [email or web address]. Please follow instruction sheet, as given. Scheduled for April 11, 2015

## 2015-04-05 ENCOUNTER — Encounter (HOSPITAL_COMMUNITY): Payer: Self-pay | Admitting: Pharmacy Technician

## 2015-04-09 ENCOUNTER — Other Ambulatory Visit (INDEPENDENT_AMBULATORY_CARE_PROVIDER_SITE_OTHER): Payer: Medicare Other | Admitting: *Deleted

## 2015-04-09 DIAGNOSIS — I350 Nonrheumatic aortic (valve) stenosis: Secondary | ICD-10-CM

## 2015-04-09 DIAGNOSIS — I2510 Atherosclerotic heart disease of native coronary artery without angina pectoris: Secondary | ICD-10-CM | POA: Diagnosis not present

## 2015-04-09 LAB — CBC WITH DIFFERENTIAL/PLATELET
BASOS ABS: 0 10*3/uL (ref 0.0–0.1)
BASOS PCT: 0.3 % (ref 0.0–3.0)
EOS PCT: 3.9 % (ref 0.0–5.0)
Eosinophils Absolute: 0.3 10*3/uL (ref 0.0–0.7)
HEMATOCRIT: 31.3 % — AB (ref 39.0–52.0)
Hemoglobin: 10.4 g/dL — ABNORMAL LOW (ref 13.0–17.0)
Lymphocytes Relative: 42 % (ref 12.0–46.0)
Lymphs Abs: 3.5 10*3/uL (ref 0.7–4.0)
MCHC: 33.2 g/dL (ref 30.0–36.0)
MCV: 92.9 fl (ref 78.0–100.0)
MONOS PCT: 6.8 % (ref 3.0–12.0)
Monocytes Absolute: 0.6 10*3/uL (ref 0.1–1.0)
NEUTROS ABS: 3.9 10*3/uL (ref 1.4–7.7)
NEUTROS PCT: 47 % (ref 43.0–77.0)
Platelets: 195 10*3/uL (ref 150.0–400.0)
RBC: 3.37 Mil/uL — AB (ref 4.22–5.81)
RDW: 13.9 % (ref 11.5–15.5)
WBC: 8.4 10*3/uL (ref 4.0–10.5)

## 2015-04-09 LAB — BASIC METABOLIC PANEL
BUN: 19 mg/dL (ref 6–23)
CO2: 32 mEq/L (ref 19–32)
Calcium: 9.4 mg/dL (ref 8.4–10.5)
Chloride: 103 mEq/L (ref 96–112)
Creatinine, Ser: 1.13 mg/dL (ref 0.40–1.50)
GFR: 64.13 mL/min (ref >60.00)
GLUCOSE: 91 mg/dL (ref 70–99)
Potassium: 4.7 mEq/L (ref 3.5–5.1)
Sodium: 139 mEq/L (ref 135–145)

## 2015-04-09 LAB — PROTIME-INR
INR: 1.1 ratio — ABNORMAL HIGH (ref 0.8–1.0)
Prothrombin Time: 12.7 s (ref 9.6–13.1)

## 2015-04-11 ENCOUNTER — Encounter (HOSPITAL_COMMUNITY): Payer: Self-pay | Admitting: Cardiovascular Disease

## 2015-04-11 ENCOUNTER — Encounter (HOSPITAL_COMMUNITY)
Admission: RE | Disposition: A | Discharge status: Home / Self Care | Payer: Self-pay | Source: Ambulatory Visit | Attending: Cardiovascular Disease

## 2015-04-11 ENCOUNTER — Ambulatory Visit (HOSPITAL_COMMUNITY)
Admission: RE | Admit: 2015-04-11 | Discharge: 2015-04-11 | Disposition: A | Discharge status: Home / Self Care | Payer: Medicare Other | Source: Ambulatory Visit | Attending: Cardiovascular Disease | Admitting: Cardiovascular Disease

## 2015-04-11 DIAGNOSIS — M1990 Unspecified osteoarthritis, unspecified site: Secondary | ICD-10-CM | POA: Diagnosis not present

## 2015-04-11 DIAGNOSIS — E785 Hyperlipidemia, unspecified: Secondary | ICD-10-CM | POA: Insufficient documentation

## 2015-04-11 DIAGNOSIS — Z87891 Personal history of nicotine dependence: Secondary | ICD-10-CM | POA: Insufficient documentation

## 2015-04-11 DIAGNOSIS — I10 Essential (primary) hypertension: Secondary | ICD-10-CM | POA: Diagnosis not present

## 2015-04-11 DIAGNOSIS — Z86010 Personal history of colonic polyps: Secondary | ICD-10-CM | POA: Diagnosis not present

## 2015-04-11 DIAGNOSIS — Z7902 Long term (current) use of antithrombotics/antiplatelets: Secondary | ICD-10-CM | POA: Diagnosis not present

## 2015-04-11 DIAGNOSIS — K219 Gastro-esophageal reflux disease without esophagitis: Secondary | ICD-10-CM | POA: Insufficient documentation

## 2015-04-11 DIAGNOSIS — R0789 Other chest pain: Secondary | ICD-10-CM | POA: Insufficient documentation

## 2015-04-11 DIAGNOSIS — N400 Enlarged prostate without lower urinary tract symptoms: Secondary | ICD-10-CM | POA: Diagnosis not present

## 2015-04-11 DIAGNOSIS — Z950 Presence of cardiac pacemaker: Secondary | ICD-10-CM | POA: Insufficient documentation

## 2015-04-11 DIAGNOSIS — I441 Atrioventricular block, second degree: Secondary | ICD-10-CM | POA: Insufficient documentation

## 2015-04-11 DIAGNOSIS — I350 Nonrheumatic aortic (valve) stenosis: Secondary | ICD-10-CM

## 2015-04-11 DIAGNOSIS — Z85828 Personal history of other malignant neoplasm of skin: Secondary | ICD-10-CM | POA: Insufficient documentation

## 2015-04-11 DIAGNOSIS — D649 Anemia, unspecified: Secondary | ICD-10-CM | POA: Diagnosis not present

## 2015-04-11 DIAGNOSIS — I2510 Atherosclerotic heart disease of native coronary artery without angina pectoris: Secondary | ICD-10-CM

## 2015-04-11 DIAGNOSIS — I714 Abdominal aortic aneurysm, without rupture: Secondary | ICD-10-CM | POA: Insufficient documentation

## 2015-04-11 DIAGNOSIS — Z955 Presence of coronary angioplasty implant and graft: Secondary | ICD-10-CM | POA: Insufficient documentation

## 2015-04-11 HISTORY — PX: CARDIAC CATHETERIZATION: SHX172

## 2015-04-11 SURGERY — RIGHT/LEFT HEART CATH AND CORONARY ANGIOGRAPHY
Anesthesia: LOCAL

## 2015-04-11 MED ORDER — MIDAZOLAM HCL 2 MG/2ML IJ SOLN
INTRAMUSCULAR | Status: AC
  Filled 2015-04-11: qty 2

## 2015-04-11 MED ORDER — VERAPAMIL HCL 2.5 MG/ML IV SOLN
INTRAVENOUS | Status: AC
  Filled 2015-04-11: qty 2

## 2015-04-11 MED ORDER — SODIUM CHLORIDE 0.9 % IJ SOLN: 3.0000 mL | INTRAMUSCULAR | Status: DC | PRN

## 2015-04-11 MED ORDER — FENTANYL CITRATE (PF) 100 MCG/2ML IJ SOLN
INTRAMUSCULAR | Status: AC
  Filled 2015-04-11: qty 2

## 2015-04-11 MED ORDER — VERAPAMIL HCL 2.5 MG/ML IV SOLN
INTRAVENOUS | Status: DC | PRN
  Administered 2015-04-11: 09:00:00 via INTRA_ARTERIAL

## 2015-04-11 MED ORDER — HEPARIN SODIUM (PORCINE) 1000 UNIT/ML IJ SOLN
INTRAMUSCULAR | Status: DC | PRN
  Administered 2015-04-11: 3500 [IU] via INTRAVENOUS
  Administered 2015-04-11: 2000 [IU] via INTRAVENOUS
  Administered 2015-04-11: 3500 [IU] via INTRAVENOUS

## 2015-04-11 MED ORDER — MIDAZOLAM HCL 2 MG/2ML IJ SOLN
INTRAMUSCULAR | Status: DC | PRN
  Administered 2015-04-11: 1 mg via INTRAVENOUS
  Administered 2015-04-11 (×2): 0.5 mg via INTRAVENOUS

## 2015-04-11 MED ORDER — SODIUM CHLORIDE 0.9 % IV SOLN
INTRAVENOUS | Status: AC
Start: 2015-04-11 — End: 2015-04-11

## 2015-04-11 MED ORDER — HEPARIN (PORCINE) IN NACL 2-0.9 UNIT/ML-% IJ SOLN
INTRAMUSCULAR | Status: AC
  Filled 2015-04-11: qty 1500

## 2015-04-11 MED ORDER — FENTANYL CITRATE (PF) 100 MCG/2ML IJ SOLN
INTRAMUSCULAR | Status: DC | PRN
  Administered 2015-04-11 (×3): 25 ug via INTRAVENOUS

## 2015-04-11 MED ORDER — SODIUM CHLORIDE 0.9 % IJ SOLN: 3.0000 mL | Freq: Two times a day (BID) | INTRAMUSCULAR | Status: DC

## 2015-04-11 MED ORDER — IOHEXOL 350 MG/ML SOLN
INTRAVENOUS | Status: DC | PRN
  Administered 2015-04-11: 55 mL via INTRA_ARTERIAL

## 2015-04-11 MED ORDER — HEPARIN SODIUM (PORCINE) 1000 UNIT/ML IJ SOLN
INTRAMUSCULAR | Status: AC
  Filled 2015-04-11: qty 1

## 2015-04-11 MED ORDER — LIDOCAINE HCL (PF) 1 % IJ SOLN
INTRAMUSCULAR | Status: AC
  Filled 2015-04-11: qty 30

## 2015-04-11 MED ORDER — HEPARIN (PORCINE) IN NACL 2-0.9 UNIT/ML-% IJ SOLN
INTRAMUSCULAR | Status: DC | PRN
  Administered 2015-04-11: 10:00:00

## 2015-04-11 MED ORDER — SODIUM CHLORIDE 0.9 % IV SOLN
INTRAVENOUS | Status: AC
  Administered 2015-04-11: 07:00:00 via INTRAVENOUS

## 2015-04-11 MED ORDER — SODIUM CHLORIDE 0.9 % IV SOLN: 250.0000 mL | INTRAVENOUS | Status: DC | PRN

## 2015-04-11 MED ORDER — ASPIRIN 81 MG PO CHEW: 81.0000 mg | CHEWABLE_TABLET | ORAL | Status: DC

## 2015-04-11 SURGICAL SUPPLY — 25 items
CATH BALLN WEDGE 5F 110CM (CATHETERS) ×2 IMPLANT
CATH EXPO 5F MPA-1 (CATHETERS) ×2 IMPLANT
CATH INFINITI 5 FR 3DRC (CATHETERS) ×2 IMPLANT
CATH INFINITI 5 FR AL2 (CATHETERS) ×2 IMPLANT
CATH INFINITI 5 FR JL3.5 (CATHETERS) ×2 IMPLANT
CATH INFINITI 5FR ANG PIGTAIL (CATHETERS) ×2 IMPLANT
CATH INFINITI 5FR JL4 (CATHETERS) ×2 IMPLANT
CATH INFINITI JR4 5F (CATHETERS) ×2 IMPLANT
CATH LAUNCHER 5F EBU3.5 (CATHETERS) ×2 IMPLANT
CATH SWAN GANZ 7F STRAIGHT (CATHETERS) ×2 IMPLANT
CATH VISTA GUIDE 6FR XBLAD3.5 (CATHETERS) ×2 IMPLANT
DEVICE RAD COMP TR BAND LRG (VASCULAR PRODUCTS) ×2 IMPLANT
GLIDESHEATH SLEND SS 6F .021 (SHEATH) ×2 IMPLANT
KIT HEART LEFT (KITS) ×2 IMPLANT
KIT HEART RIGHT NAMIC (KITS) ×2 IMPLANT
PACK CARDIAC CATHETERIZATION (CUSTOM PROCEDURE TRAY) ×2 IMPLANT
SHEATH PINNACLE 5F 10CM (SHEATH) ×2 IMPLANT
SHEATH PINNACLE 7F 10CM (SHEATH) ×2 IMPLANT
SYR MEDRAD MARK V 150ML (SYRINGE) ×2 IMPLANT
TRANSDUCER W/STOPCOCK (MISCELLANEOUS) ×4 IMPLANT
TUBING CIL FLEX 10 FLL-RA (TUBING) ×2 IMPLANT
WIRE EMERALD 3MM-J .025X260CM (WIRE) ×2 IMPLANT
WIRE EMERALD 3MM-J .035X150CM (WIRE) ×2 IMPLANT
WIRE HI TORQ VERSACORE-J 145CM (WIRE) ×2 IMPLANT
WIRE SAFE-T 1.5MM-J .035X260CM (WIRE) ×2 IMPLANT

## 2015-04-11 NOTE — H&P (View-Only) (Signed)
Chief Complaint  Patient presents with  . Chest Pain     History of Present Illness: 81 yo male with history of CAD, HLD, anemia, symptomatic bradycardia now s/p PPM placement, HTN, BPH, GERD and severe aortic valve stenosis who is referred to the valve clinic today for further discussion regarding his aortic valve stenosis. His cardiac issues are followed by Dr. Martinique. He has been known to have severe aortic valve stenosis for many years but has not been considered a candidate for traditional AVR given his advanced age. He was admitted to [facility] 03/09/15 with c/o chest pain. Troponin was negative. Echo was repeated and showed continued aortic stenosis with a mean gradient of 42 mm Hg and AVA of 0.66cm2 with normal LV systolic function. He has a history of CAD with remote stent placement in 1999 and last cath in January 2013 which showed mild to moderate disease in the major epicardial vessels with severe ostial diagonal stenosis not felt to be amenable to PCI. Permanent pacemaker was placed in 2004 for symptomatic bradycardia. This has been followed by Dr. Lovena Le with replacement in June 2012. He has a 4.3 cm abdominal aortic aneurysm followed by Dr. Kellie Simmering. CT in October was unchanged. He was seen by pulmonary previously for pulmonary nodule but this was felt to be unchanged from 5 years ago. He is followed by Dr. Karsten Ro for a renal tumor that has been stable.  He tells me today that he was very active up until 2 months ago. He has been experiencing chest pressure and dyspnea with minimal exertion. He has constant fatigue. He had been playing golf up until his symptoms worsened. He has had no LE edema, near syncope, syncope or dizziness. He is a retired Building control surveyor. He has been married for 13 years. He lives with his wife. He is here today with his son and daughter in law.   Primary Care Physician: Leanna Battles   Past Medical History  Diagnosis Date  . CAD (coronary artery disease)    Remote stent to LAD in 1999. Last cath in 2004 and he is managed medically  . Anemia   . Hyperlipidemia   . Pacemaker     due to bradycardia; placed in 2004  . Aortic stenosis   . BPH (benign prostatic hyperplasia)   . HTN (hypertension)   . Colon polyps   . Blood transfusion   . GERD (gastroesophageal reflux disease)   . Hemorrhoid   . Complication of anesthesia   . Dysrhythmia     paced  . Arthritis 09/18/11    "in my knees"  . Basal cell carcinoma of skin   . AAA (abdominal aortic aneurysm)   . Lung nodule     RLL  . Renal mass, left     Past Surgical History  Procedure Laterality Date  . Nasal hemorrhage control  1980's  . Insert / replace / remove pacemaker  2004    initial placement; [employer]  . Insert / replace / remove pacemaker  02/14/2011  . Cardiac catheterization  2004    Managed medically  . Coronary angioplasty with stent placement  05/09/1998    "1"; LAD  . Tonsillectomy      "when I was a teenager"  . Skin cancer excision  09/18/11    "have had a couple hundred of them removed since I was in my 56's"  . Cardiac catheterization    . Coronary angiogram  09/19/2011    Procedure: CORONARY ANGIOGRAM;  Surgeon: Josue Hector, MD;  Location: [facility] CATH LAB;  Service: Cardiovascular;;    Current Outpatient Prescriptions  Medication Sig Dispense Refill  . acetaminophen (TYLENOL) 325 MG tablet Take 650 mg by mouth every 6 (six) hours as needed for mild pain, moderate pain or fever.     Marland Kitchen amLODipine (NORVASC) 5 MG tablet Take 1 tablet (5 mg total) by mouth daily. 30 tablet 5  . clopidogrel (PLAVIX) 75 MG tablet Take 75 mg by mouth daily.      . ferrous sulfate 325 (65 FE) MG tablet Take 325 mg by mouth daily with breakfast.     . NEXIUM 40 MG capsule TAKE 1 CAPSULE BY MOUTH EVERY DAY (Patient taking differently: TAKE 40 MG BY MOUTH DAILY.) 30 capsule 6  . rosuvastatin (CRESTOR) 5 MG tablet TAKE 1 TABLET BY MOUTH EVERY DAY (Patient taking differently: Take 5 mg by mouth  daily. ) 30 tablet 10  . silodosin (RAPAFLO) 4 MG CAPS capsule Take 4 mg by mouth daily with breakfast.      No current facility-administered medications for this visit.    Allergies  Allergen Reactions  . Aspirin Other (See Comments)    Stomach bleeds    History   Social History  . Marital Status: Married    Spouse Name: N/A  . Number of Children: 2  . Years of Education: N/A   Occupational History  . Retired     Building control surveyor   Social History Main Topics  . Smoking status: Former Smoker -- 1.00 packs/day for 42 years    Types: Cigarettes    Quit date: 09/15/1973  . Smokeless tobacco: Former Systems developer    Types: Chew  . Alcohol Use: No  . Drug Use: No  . Sexual Activity: Yes   Other Topics Concern  . Not on file   Social History Narrative    Family History  Problem Relation Age of Onset  . Cancer Father     stomach  . Colon cancer Father   . Heart disease Brother   . Heart disease Brother   . Diabetes Other     granddaughter    Review of Systems:  As stated in the HPI and otherwise negative.   BP 120/60 mmHg  Pulse 81  Ht 5\' 4"  (1.626 m)  Wt 157 lb 12.8 oz (71.578 kg)  BMI 27.07 kg/m2  SpO2 95%  Physical Examination: General: Well developed, well nourished, NAD HEENT: OP clear, mucus membranes moist SKIN: warm, dry. No rashes. Neuro: No focal deficits Musculoskeletal: Muscle strength 5/5 all ext Psychiatric: Mood and affect normal Neck: No JVD, no carotid bruits, no thyromegaly, no lymphadenopathy. Lungs:Clear bilaterally, no wheezes, rhonci, crackles Cardiovascular: Regular rate and rhythm. Loud harsh systolic murmur. No gallops or rubs. Abdomen:Soft. Bowel sounds present. Non-tender.  Extremities: No lower extremity edema. Pulses are 2 + in the bilateral DP/PT.  Echo June 2016: Left ventricle: The cavity size was normal. Wall thickness was increased in a pattern of mild LVH. Systolic function was normal. The estimated ejection fraction was in the  range of 60% to 65%. Wall motion was normal; there were no regional wall motion abnormalities. Doppler parameters are consistent with abnormal left ventricular relaxation (grade 1 diastolic dysfunction). - Aortic valve: Moderately calcified annulus. Trileaflet; moderately thickened leaflets. There was severe stenosis. Mean gradient (S): 42 mm Hg. VTI ratio of LVOT to aortic valve: 0.26. Valve area (VTI): 0.74 cm^2. Valve area (Vmax): 0.77 cm^2. Valve area (Vmean): 0.66 cm^2. -  Mitral valve: Moderately calcified annulus. Moderately thickened leaflets . The findings are consistent with mild stenosis. There was mild regurgitation. Valve area by continuity equation (using LVOT flow): 2.14 cm^2. - Left atrium: The atrium was mildly dilated. - Technically difficult study.  Cardiac cath January 2013: LM: 20% distal LAD: Calcified. 30% proximal 40% mid Normal distal wraps apex IM: large and normal D1: 80% ostial calcified and small vessel D2: normal large vessel Circumflex: Normal OM1- normal OM2: Normal RCA: Dominant 30% proximal  EKG:  EKG is not ordered today. The ekg ordered today demonstrates   Recent Labs: 03/09/2015: B Natriuretic Peptide 86.6; BUN 28*; Creatinine, Ser 1.20; Hemoglobin 9.3*; Platelets 215; Potassium 4.2; Sodium 136   Lipid Panel    Component Value Date/Time   CHOL 104 09/19/2011 0135   TRIG 66 09/19/2011 0135   HDL 38* 09/19/2011 0135   CHOLHDL 2.7 09/19/2011 0135   VLDL 13 09/19/2011 0135   LDLCALC 53 09/19/2011 0135     Wt Readings from Last 3 Encounters:  03/22/15 157 lb 12.8 oz (71.578 kg)  03/09/15 159 lb 9.6 oz (72.394 kg)  12/11/14 161 lb 14.4 oz (73.437 kg)     Other studies Reviewed: Additional studies/ records that were reviewed today include: Echo images, cath report. Hospital records.  Review of the above records demonstrates:   STS Risk Score:  Risk  of Mortality: 6.838%  Morbidity or Mortality: 21.729%  Long Length of Stay: 9.292%   Assessment and Plan:   1. Severe aortic valve stenosis: He has class D symptomatic aortic stenosis with dyspnea and chest pressure with minimal exertion. He is a poor candidate for traditional AVR given his advanced age. His STS risk score suggests a 6.8% chance of mortality with traditional AVR (current coronary anatomy is unknown). I think that he would be a reasonable candidate for TAVR despite his advanced age given his overall functional level. He has known CAD that was moderate by last cath in 2013. I will arrange a right and left heart cath on 04/11/15 at [facility] to further assess his AS and exclude progression of CAD. Risks and benefits of cath reviewed with pt. He agrees to proceed. He interested in pursuing TAVR. Following his cath he will be referred to see Dr. Roxy Manns or Dr. Cyndia Bent in the [facility] for a surgical opinion on his AS. I have spent 45 minutes today explaining the TAVR procedure and cardiac cath including risks of the procedure.   2. CAD: Stable. Will further assess with cardiac cath   3. High grade AV block (Mobitz II): PPM in place.    Current medicines are reviewed at length with the patient today.  The patient does not have concerns regarding medicines.  The following changes have been made:  no change  Labs/ tests ordered today include:  No orders of the defined types were placed in this encounter.    Disposition:   FU with me following the cath   Signed, Lauree Chandler, MD 03/22/2015 2:01 PM    [facility] [address], [place], [place]  [address] Phone: [phone]; Fax: [phone]

## 2015-04-11 NOTE — Interval H&P Note (Signed)
History and Physical Interval Note:  04/11/2015 8:26 AM  Jordan [name]  has presented today for cardiac cath with the diagnosis of aortic stenosis/pre TAVR. The various methods of treatment have been discussed with the patient and family. After consideration of risks, benefits and other options for treatment, the patient has consented to  Procedure(s): Right/Left Heart Cath and Coronary Angiography (N/A) as a surgical intervention .  The patient's history has been reviewed, patient examined, no change in status, stable for surgery.  I have reviewed the patient's chart and labs.  Questions were answered to the patient's satisfaction.   No PCI will be performed.     Wynonna Fitzhenry

## 2015-04-11 NOTE — Progress Notes (Signed)
Site area: right groin a 5 french arterial sheath and a 7 french venous sheath was removed  Site Prior to Removal:  Level 0  Pressure Applied For 20 MINUTES    Minutes Beginning at 1040a  Manual:   Yes.    Patient Status During Pull:  stable  Post Pull Groin Site:  Level 0  Post Pull Instructions Given:  Yes.    Post Pull Pulses Present:  Yes.    Dressing Applied:  Yes.    Comments:  VS remain stable during sheath pull. Pt denies any discomfort at this time and neuro checks                             Remain unremarkable.

## 2015-04-11 NOTE — Discharge Instructions (Signed)
Angiogram, Care After °Refer to this sheet in the next few weeks. These instructions provide you with information on caring for yourself after your procedure. Your health care provider may also give you more specific instructions. Your treatment has been planned according to current medical practices, but problems sometimes occur. Call your health care provider if you have any problems or questions after your procedure.  °WHAT TO EXPECT AFTER THE PROCEDURE °After your procedure, it is typical to have the following sensations: °· Minor discomfort or tenderness and a small bump at the catheter insertion site. The bump should usually decrease in size and tenderness within 1 to 2 weeks. °· Any bruising will usually fade within 2 to 4 weeks. °HOME CARE INSTRUCTIONS  °· You may need to keep taking blood thinners if they were prescribed for you. Take medicines only as directed by your health care provider. °· Do not apply powder or lotion to the site. °· Do not take baths, swim, or use a hot tub until your health care provider approves. °· You may shower 24 hours after the procedure. Remove the bandage (dressing) and gently wash the site with plain soap and water. Gently pat the site dry. °· Inspect the site at least twice daily. °· Limit your activity for the first 48 hours. Do not bend, squat, or lift anything over 20 lb (9 kg) or as directed by your health care provider. °· Plan to have someone take you home after the procedure. Follow instructions about when you can drive or return to work. °SEEK MEDICAL CARE IF: °· You get light-headed when standing up. °· You have drainage (other than a small amount of blood on the dressing). °· You have chills. °· You have a fever. °· You have redness, warmth, swelling, or pain at the insertion site. °SEEK IMMEDIATE MEDICAL CARE IF:  °· You develop chest pain or shortness of breath, feel faint, or pass out. °· You have bleeding, swelling larger than a walnut, or drainage from the  catheter insertion site. °· You develop pain, discoloration, coldness, or severe bruising in the leg or arm that held the catheter. °· You develop bleeding from any other place, such as the bowels. You may see bright red blood in your urine or stools, or your stools may appear black and tarry. °· You have heavy bleeding from the site. If this happens, hold pressure on the site. °MAKE SURE YOU: °· Understand these instructions. °· Will watch your condition. °· Will get help right away if you are not doing well or get worse. °Document Released: 03/20/2005 Document Revised: 01/16/2014 Document Reviewed: 01/24/2013 °[employer]® Patient Information ©2015 [employer]. This information is not intended to replace advice given to you by your health care provider. Make sure you discuss any questions you have with your health care provider. ° °Radial Site Care °Refer to this sheet in the next few weeks. These instructions provide you with information on caring for yourself after your procedure. Your caregiver may also give you more specific instructions. Your treatment has been planned according to current medical practices, but problems sometimes occur. Call your caregiver if you have any problems or questions after your procedure. °HOME CARE INSTRUCTIONS °· You may shower the day after the procedure. Remove the bandage (dressing) and gently wash the site with plain soap and water. Gently pat the site dry. °· Do not apply powder or lotion to the site. °· Do not submerge the affected site in water for 3 to 5 days. °·   Inspect the site at least twice daily. °· Do not flex or bend the affected arm for 24 hours. °· No lifting over 5 pounds (2.3 kg) for 5 days after your procedure. °· Do not drive home if you are discharged the same day of the procedure. Have someone else drive you. °· You may drive 24 hours after the procedure unless otherwise instructed by your caregiver. °· Do not operate machinery or power tools for 24  hours. °· A responsible adult should be with you for the first 24 hours after you arrive home. °What to expect: °· Any bruising will usually fade within 1 to 2 weeks. °· Blood that collects in the tissue (hematoma) may be painful to the touch. It should usually decrease in size and tenderness within 1 to 2 weeks. °SEEK IMMEDIATE MEDICAL CARE IF: °· You have unusual pain at the radial site. °· You have redness, warmth, swelling, or pain at the radial site. °· You have drainage (other than a small amount of blood on the dressing). °· You have chills. °· You have a fever or persistent symptoms for more than 72 hours. °· You have a fever and your symptoms suddenly get worse. °· Your arm becomes pale, cool, tingly, or numb. °· You have heavy bleeding from the site. Hold pressure on the site. °Document Released: 10/04/2010 Document Revised: 11/24/2011 Document Reviewed: 10/04/2010 °[employer]® Patient Information ©2015 [employer]. This information is not intended to replace advice given to you by your health care provider. Make sure you discuss any questions you have with your health care provider. ° °

## 2015-04-12 LAB — POCT I-STAT 3, VENOUS BLOOD GAS (G3P V)
Acid-Base Excess: 2 mmol/L (ref 0.0–2.0)
BICARBONATE: 28.1 meq/L — AB (ref 20.0–24.0)
O2 SAT: 68 %
TCO2: 30 mmol/L (ref 0–100)
pCO2, Ven: 49.2 mmHg (ref 45.0–50.0)
pH, Ven: 7.365 — ABNORMAL HIGH (ref 7.250–7.300)
pO2, Ven: 37 mmHg (ref 30.0–45.0)

## 2015-04-12 LAB — POCT I-STAT 3, ART BLOOD GAS (G3+)
ACID-BASE EXCESS: 2 mmol/L (ref 0.0–2.0)
Bicarbonate: 26.7 mEq/L — ABNORMAL HIGH (ref 20.0–24.0)
O2 SAT: 94 %
PCO2 ART: 39.7 mmHg (ref 35.0–45.0)
PO2 ART: 68 mmHg — AB (ref 80.0–100.0)
TCO2: 28 mmol/L (ref 0–100)
pH, Arterial: 7.436 (ref 7.350–7.450)

## 2015-04-12 LAB — POCT ACTIVATED CLOTTING TIME
Activated Clotting Time: 122 seconds
Activated Clotting Time: 140 seconds

## 2015-04-18 ENCOUNTER — Encounter: Payer: Medicare Other | Admitting: Surgery

## 2015-04-20 ENCOUNTER — Other Ambulatory Visit: Payer: Self-pay

## 2015-04-20 ENCOUNTER — Encounter: Payer: Self-pay | Admitting: Surgery

## 2015-04-20 ENCOUNTER — Other Ambulatory Visit: Payer: Self-pay | Admitting: *Deleted

## 2015-04-20 ENCOUNTER — Institutional Professional Consult (permissible substitution) (INDEPENDENT_AMBULATORY_CARE_PROVIDER_SITE_OTHER): Payer: Medicare Other | Admitting: Surgery

## 2015-04-20 VITALS — BP 136/74 | HR 86 | Resp 16 | Ht 64.0 in | Wt 155.0 lb

## 2015-04-20 DIAGNOSIS — I714 Abdominal aortic aneurysm, without rupture, unspecified: Secondary | ICD-10-CM

## 2015-04-20 DIAGNOSIS — R911 Solitary pulmonary nodule: Secondary | ICD-10-CM | POA: Diagnosis not present

## 2015-04-20 DIAGNOSIS — I350 Nonrheumatic aortic (valve) stenosis: Secondary | ICD-10-CM

## 2015-04-20 MED ORDER — AMLODIPINE BESYLATE 5 MG PO TABS: 5.0000 mg | ORAL_TABLET | Freq: Every day | ORAL | Status: DC

## 2015-04-24 ENCOUNTER — Other Ambulatory Visit: Payer: Self-pay | Admitting: Cardiology

## 2015-04-24 ENCOUNTER — Ambulatory Visit (HOSPITAL_COMMUNITY)
Admission: RE | Admit: 2015-04-24 | Discharge: 2015-04-24 | Disposition: A | Discharge status: Home / Self Care | Payer: Medicare Other | Source: Ambulatory Visit | Attending: Surgery | Admitting: Surgery

## 2015-04-24 ENCOUNTER — Ambulatory Visit (HOSPITAL_COMMUNITY): Payer: Medicare Other

## 2015-04-24 ENCOUNTER — Encounter: Payer: Self-pay | Admitting: Surgery

## 2015-04-24 DIAGNOSIS — I350 Nonrheumatic aortic (valve) stenosis: Secondary | ICD-10-CM

## 2015-04-24 LAB — PULMONARY FUNCTION TEST
DL/VA % pred: 128 %
DL/VA: 5.18 ml/min/mmHg/L
DLCO COR % PRED: 76 %
DLCO UNC % PRED: 65 %
DLCO cor: 17.46 ml/min/mmHg
DLCO unc: 14.97 ml/min/mmHg
FEF 25-75 Post: 1.62 L/sec
FEF 25-75 Pre: 1.58 L/sec
FEF2575-%CHANGE-POST: 2 %
FEF2575-%PRED-PRE: 254 %
FEF2575-%Pred-Post: 260 %
FEV1-%Change-Post: 1 %
FEV1-%PRED-POST: 121 %
FEV1-%Pred-Pre: 119 %
FEV1-Post: 1.69 L
FEV1-Pre: 1.66 L
FEV1FVC-%Change-Post: 0 %
FEV1FVC-%PRED-PRE: 116 %
FEV6-%Change-Post: 1 %
FEV6-%Pred-Post: 108 %
FEV6-%Pred-Pre: 106 %
FEV6-POST: 2.13 L
FEV6-Pre: 2.09 L
FEV6FVC-%PRED-POST: 113 %
FEV6FVC-%PRED-PRE: 113 %
FVC-%CHANGE-POST: 1 %
FVC-%Pred-Post: 95 %
FVC-%Pred-Pre: 93 %
FVC-POST: 2.13 L
FVC-Pre: 2.09 L
POST FEV6/FVC RATIO: 100 %
PRE FEV6/FVC RATIO: 100 %
Post FEV1/FVC ratio: 79 %
Pre FEV1/FVC ratio: 79 %
RV % PRED: 79 %
RV: 2.05 L
TLC % PRED: 77 %
TLC: 4.42 L

## 2015-04-24 MED ORDER — ALBUTEROL SULFATE (2.5 MG/3ML) 0.083% IN NEBU
2.5000 mg | INHALATION_SOLUTION | Freq: Once | RESPIRATORY_TRACT | Status: AC
  Administered 2015-04-24: 2.5 mg via RESPIRATORY_TRACT

## 2015-04-24 NOTE — Progress Notes (Signed)
Patient ID: [id number] STEMM, male   DOB: August 06, 1918, 81 y.o.   MRN: [id number]  [facility] CONSULTATION REPORT  Referring Provider is [place], Peter M, MD PCP is Donnajean Lopes, MD  Chief Complaint  Patient presents with  . Aortic Stenosis    severe...EVAL FOR TAVR    HPI:  The patient is a 81 year old gentleman with hyperlipidemia, HTN, CAD, symptomatic bradycardia now s/p PPM, and severe aortic stenosis. He has had severe AS for many years but was not considered a candidate for surgery due to his advanced age. He was admitted on 03/09/2015 with chest pain and ruled out for MI. An echo showed a mean AV gradient of 42 and an AVA of 0.66 cm2. LV function was normal. He has a history of CAD with remote stent placement in 1999 and last cath in January 2013 which showed mild to moderate coronary disease with severe ostial diagonal stenosis not felt to be amenable to PCI. A cardiac cath on 04/11/2015 showed mild to moderate non-obstructive CAD with severe AS and a mean gradient of 41 mm Hg. He has a 4.3 cm abdominal aortic aneurysm followed by Dr. Kellie Simmering. A CT in October  2015 was unchanged. He was seen by pulmonary previously for pulmonary nodule but this was felt to be unchanged from 5 years ago. He is followed by Dr. Karsten Ro for a renal tumor that has been stable.   He has been active until about 2 months ago when he started having chest pressure and shortness of breath with minimal exertion. He has been very tired and can't play golf any longer. He lives with his wife. His son and daughter in law are with him today.   Past Medical History  Diagnosis Date  . CAD (coronary artery disease)     Remote stent to LAD in 1999. Last cath in 2004 and he is managed medically  . Anemia   . Hyperlipidemia   . Pacemaker     due to bradycardia; placed in 2004  . Aortic stenosis   . BPH (benign prostatic hyperplasia)     . HTN (hypertension)   . Colon polyps   . Blood transfusion   . GERD (gastroesophageal reflux disease)   . Hemorrhoid   . Complication of anesthesia   . Dysrhythmia     paced  . Arthritis 09/18/11    "in my knees"  . Basal cell carcinoma of skin   . AAA (abdominal aortic aneurysm)   . Lung nodule     RLL  . Renal mass, left     Past Surgical History  Procedure Laterality Date  . Nasal hemorrhage control  1980's  . Insert / replace / remove pacemaker  2004    initial placement; [employer]  . Insert / replace / remove pacemaker  02/14/2011  . Cardiac catheterization  2004    Managed medically  . Coronary angioplasty with stent placement  05/09/1998    "1"; LAD  . Tonsillectomy      "when I was a teenager"  . Skin cancer excision  09/18/11    "have had a couple hundred of them removed since I was in my 40's"  . Cardiac catheterization    . Coronary angiogram  09/19/2011    Procedure: CORONARY ANGIOGRAM;  Surgeon: Josue Hector, MD;  Location: [facility] CATH LAB;  Service: Cardiovascular;;  . Cardiac catheterization N/A 04/11/2015    Procedure:  Right/Left Heart Cath and Coronary Angiography;  Surgeon: Burnell Blanks, MD;  Location: [facility] CV LAB;  Service: Cardiovascular;  Laterality: N/A;    Family History  Problem Relation Age of Onset  . Cancer Father     stomach  . Colon cancer Father   . Heart disease Brother   . Heart disease Brother   . Diabetes Other     granddaughter    History   Social History  . Marital Status: Married    Spouse Name: N/A  . Number of Children: 2  . Years of Education: N/A   Occupational History  . Retired     Building control surveyor   Social History Main Topics  . Smoking status: Former Smoker -- 1.00 packs/day for 42 years    Types: Cigarettes    Quit date: 09/15/1973  . Smokeless tobacco: Former Systems developer    Types: Chew  . Alcohol Use: No  . Drug Use: No  . Sexual Activity: Yes   Other Topics Concern  . Not on file   Social History  Narrative    Current Outpatient Prescriptions  Medication Sig Dispense Refill  . acetaminophen (TYLENOL) 325 MG tablet Take 650 mg by mouth every 6 (six) hours as needed for mild pain, moderate pain or fever.     . clopidogrel (PLAVIX) 75 MG tablet Take 75 mg by mouth daily.      Marland Kitchen esomeprazole (NEXIUM) 40 MG capsule Take 40 mg by mouth daily.    . ferrous sulfate 325 (65 FE) MG tablet Take 325 mg by mouth every evening.     . rosuvastatin (CRESTOR) 5 MG tablet Take 5 mg by mouth daily.    . silodosin (RAPAFLO) 4 MG CAPS capsule Take 4 mg by mouth every evening.     Marland Kitchen amLODipine (NORVASC) 5 MG tablet Take 1 tablet (5 mg total) by mouth daily. 30 tablet 2   No current facility-administered medications for this visit.    Allergies  Allergen Reactions  . Aspirin Other (See Comments)    Stomach bleeds      Review of Systems:   General:  normal appetite, decreased energy, no weight gain, no weight loss, no fever  Cardiac:  has chest pain with exertion, no chest pain at rest, has SOB with mild exertion, no resting SOB, no PND, no orthopnea, no palpitations, no arrhythmia, no atrial fibrillation, no LE edema, has dizzy spells, no syncope  Respiratory:  Has exertional shortness of breath, no home oxygen, no productive cough, no dry cough, no bronchitis, no wheezing, no hemoptysis, no asthma, no pain with inspiration or cough, no sleep apnea, no CPAP at night  GI:   no difficulty swallowing, no reflux, no frequent heartburn, no hiatal hernia, no abdominal pain, no constipation, no diarrhea, no hematochezia, no hematemesis, no melena  GU:   no dysuria,  no frequency, no urinary tract infection, no hematuria, no enlarged prostate, no kidney stones, no kidney disease  Vascular:  no pain suggestive of claudication, no pain in feet, no leg cramps, no varicose veins, no DVT, no non-healing foot ulcer  Neuro:   no stroke, no TIA's, no seizures, no headaches, no temporary blindness one eye,  no  slurred speech, no peripheral neuropathy, no chronic pain, no instability of gait, no memory/cognitive dysfunction  Musculoskeletal: no arthritis, no joint swelling, no myalgias, no difficulty walking, no mobility   Skin:   no rash, no itching, no skin infections, no pressure sores or ulcerations  Psych:  No anxiety, no depression, no nervousness, no unusual recent stress  Eyes:   has blurry vision, no floaters, no recent vision changes,  wears glasses or contacts  ENT:   has hearing loss, no loose or painful teeth, no dentures, last saw dentist this year  Hematologic:   easy bruising, no abnormal bleeding, no clotting disorder, no frequent epistaxis  Endocrine:  no diabetes, does not check CBG's at home       Physical Exam:   BP 136/74 mmHg  Pulse 86  Resp 16  Ht 5\' 4"  (1.626 m)  Wt 155 lb (70.308 kg)  BMI 26.59 kg/m2  SpO2 98%  General:  Elderly,   well-appearing gentleman who looks good for his age  62:  Unremarkable , NCAT, PERLA, EOMI, oropharynx clear  Neck:   no JVD, no bruits, no adenopathy or thyromegaly  Chest:   clear to auscultation, symmetrical breath sounds, no wheezes, no rhonchi   CV:   RRR, grade III/VI crescendo/decrescendo murmur heard best at RSB,  no diastolic murmur  Abdomen:  soft, non-tender, no masses or organomegaly  Extremities:  warm, well-perfused, pulses palpable in feet, no LE edema  Rectal/GU  Deferred  Neuro:   Grossly non-focal and symmetrical throughout  Skin:   Clean and dry, no rashes, no breakdown   Diagnostic Tests:         *[place]*         *[facility]*             [address]            [place],  [address]              [phone]  ------------------------------------------------------------------- Transthoracic Echocardiography  Patient:  Brycen, Bean MR #:    [id number] Study Date: 03/10/2015 Gender:   M Age:     59 Height:   162.6 cm Weight:   69.9 kg BSA:    1.79 m^2 Pt. Status: Room:    [address], [place] Foster [address], [place] [address], [place], [place] PERFORMING  [facility], Inpatient SONOGRAPHER Minus Breeding  cc:  ------------------------------------------------------------------- LV EF: 60% -  65%  ------------------------------------------------------------------- Indications:   Chest pain 786.51.  ------------------------------------------------------------------- History:  PMH:  Coronary artery disease. Aortic valve disease. Risk factors: Dyslipidemia.  ------------------------------------------------------------------- Study Conclusions  - Left ventricle: The cavity size was normal. Wall thickness was increased in a pattern of mild LVH. Systolic function was normal. The estimated ejection fraction was in the range of 60% to 65%. Wall motion was normal; there were no regional wall motion abnormalities. Doppler parameters are consistent with abnormal left ventricular relaxation (grade 1 diastolic dysfunction). - Aortic valve: Moderately calcified annulus. Trileaflet; moderately thickened leaflets. There was severe stenosis. Mean gradient (S): 42 mm Hg. VTI ratio of LVOT to aortic valve: 0.26. Valve area (VTI): 0.74 cm^2. Valve area (Vmax): 0.77 cm^2. Valve area (Vmean): 0.66 cm^2. - Mitral valve: Moderately calcified annulus. Moderately thickened leaflets . The findings are consistent with mild stenosis. There was mild regurgitation. Valve area by continuity equation (using LVOT flow): 2.14 cm^2. - Left atrium: The atrium was mildly dilated. - Technically difficult study.  ------------------------------------------------------------------- Labs, prior tests, procedures, and surgery: Permanent pacemaker system implantation.  Transthoracic  echocardiography. M-mode, complete 2D, spectral Doppler, and color Doppler. Birthdate: Patient birthdate: [birth year]. Age: Patient is 81 yr old. Sex: Gender: male. BMI: 26.4 kg/m^2. Blood pressure:  128/70 Patient status: Inpatient. Study date: Study date: 03/10/2015. Study time: 07:59 AM. Location: Bedside.  -------------------------------------------------------------------  ------------------------------------------------------------------- Left ventricle: The cavity size was normal. Wall thickness was increased in a pattern of mild LVH. Systolic function was normal. The estimated ejection fraction was in the range of 60% to 65%. Wall motion was normal; there were no regional wall motion abnormalities. Doppler parameters are consistent with abnormal left ventricular relaxation (grade 1 diastolic dysfunction).  ------------------------------------------------------------------- Aortic valve:  Moderately calcified annulus. Trileaflet; moderately thickened leaflets. Doppler:  There was severe stenosis.  There was no significant regurgitation.  VTI ratio of LVOT to aortic valve: 0.26. Valve area (VTI): 0.74 cm^2. Indexed valve area (VTI): 0.41 cm^2/m^2. Peak velocity ratio of LVOT to aortic valve: 0.27. Valve area (Vmax): 0.77 cm^2. Indexed valve area (Vmax): 0.43 cm^2/m^2. Mean velocity ratio of LVOT to aortic valve: 0.26. Valve area (Vmean): 0.66 cm^2. Indexed valve area (Vmean): 0.37 cm^2/m^2.  Mean gradient (S): 42 mm Hg. Peak gradient (S): 69 mm Hg.  ------------------------------------------------------------------- Aorta: Aortic root: The aortic root was normal in size.  ------------------------------------------------------------------- Mitral valve:  Moderately calcified annulus. Moderately thickened leaflets . Doppler:  The findings are consistent with mild stenosis.  There was mild regurgitation.  Valve area by continuity equation (using  LVOT flow): 2.14 cm^2. Indexed valve area by continuity equation (using LVOT flow): 1.19 cm^2/m^2. Mean gradient (D): 4 mm Hg. Peak gradient (D): 4 mm Hg.  ------------------------------------------------------------------- Left atrium: The atrium was mildly dilated.  ------------------------------------------------------------------- Right ventricle: The cavity size was normal. Wall thickness was normal. Systolic function was normal.  ------------------------------------------------------------------- Pulmonic valve:  Not well visualized. Doppler:  There was no evidence for stenosis.  There was mild regurgitation.  ------------------------------------------------------------------- Tricuspid valve: Not well visualized. Doppler:  There was no evidence for stenosis.  There was mild regurgitation.  ------------------------------------------------------------------- Pulmonary artery:  Systolic pressure could not be accurately estimated.  Inadequate TR jet.  ------------------------------------------------------------------- Right atrium: The atrium was normal in size.  ------------------------------------------------------------------- Pericardium: There was no pericardial effusion.  ------------------------------------------------------------------- Systemic veins: IVC is poorly visualized.  ------------------------------------------------------------------- Measurements  Left ventricle              Value      Reference LV ID, ED, PLAX chordal     (L)   31.2  mm    43 - 52 LV ID, ES, PLAX chordal     (L)   22.3  mm    23 - 38 LV fx shortening, PLAX chordal      29   %    >=29 LV PW thickness, ED           11   mm    --------- IVS/LV PW ratio, ED           0.09      <=1.3  Ventricular septum            Value      Reference IVS thickness, ED             1   mm    ---------  LVOT                   Value      Reference LVOT ID, S                18   mm    --------- LVOT area                2.54  cm^2   --------- LVOT peak velocity, S          111  cm/s   --------- LVOT mean velocity, S          80.5  cm/s   --------- LVOT VTI, S               25   cm    ---------  Aortic valve               Value      Reference Aortic valve peak velocity, S      415.9 cm/s   --------- Aortic valve mean velocity, S      308.43 cm/s   --------- Aortic valve VTI, S           94.48 cm    --------- Aortic mean gradient, S         42   mm Hg  --------- Aortic peak gradient, S         69   mm Hg  --------- VTI ratio, LVOT/AV            0.26      --------- Aortic valve area, VTI          0.74  cm^2   --------- Aortic valve area/bsa, VTI        0.41  cm^2/m^2 --------- Velocity ratio, peak, LVOT/AV      0.27      --------- Aortic valve area, peak velocity     0.77  cm^2   --------- Aortic valve area/bsa, peak       0.43  cm^2/m^2 --------- velocity Velocity ratio, mean, LVOT/AV      0.26      --------- Aortic valve area, mean velocity     0.66  cm^2   --------- Aortic valve area/bsa, mean       0.37  cm^2/m^2 --------- velocity  Aorta                  Value      Reference Aortic root ID, ED            32   mm    ---------  Left atrium               Value      Reference LA ID, A-P, ES              35   mm    --------- LA ID/bsa, A-P              1.95  cm/m^2  <=2.2  Mitral valve               Value      Reference Mitral E-wave  peak velocity       101  cm/s   --------- Mitral A-wave peak velocity       157  cm/s   --------- Mitral mean velocity, D         87.76 cm/s   --------- Mitral deceleration time     (H)   437  ms    150 - 230 Mitral mean gradient, D         4   mm Hg  --------- Mitral peak gradient, D         4   mm Hg  --------- Mitral E/A ratio, peak          0.6       --------- Mitral valve area,  LVOT         2.14  cm^2   --------- continuity Mitral valve area/bsa, LVOT       1.19  cm^2/m^2 --------- continuity  Systemic veins              Value      Reference Estimated CVP              3   mm Hg  ---------  Right ventricle             Value      Reference RV s&', lateral, S            11.5  cm/s   ---------  Legend: (L) and (H) mark values outside specified reference range.  ------------------------------------------------------------------- Prepared and Electronically Authenticated by  Kerry Hough, M.D. 2016-06-25T13:46:39  Conclusion     Prox RCA lesion, 30% stenosed.  Prox LAD lesion, 40% stenosed.  Prox Cx lesion, 30% stenosed.  Severe aortic valve stenosis  1. Mild to moderate non-obstructive CAD  2. Severe aortic valve stenosis (mean gradient 41 mmHg, Peak to peak 59 mm Hg), AVA 1.35 cm2  Of note, this was a very difficult procedure with use of many catheters to attempt selective coronary engagement which was limited due to calcification and tortuosity of vessels.   Recommendations: He has severe AS. His CAD is stable. Will refer to see Dr. Cyndia Bent or Dr. Roxy Manns to discuss options for TAVR. If he is felt to be a good candidate, will proceed with CT scans and PFTs.      Coronary Findings    Dominance: Right   Left Anterior Descending   . Prox LAD lesion, 40% stenosed.  diffuse .     Ramus Intermedius  The vessel is small .     Left Circumflex   . Prox Cx lesion, 30% stenosed. discrete .   Marland Kitchen First Obtuse Marginal Branch   The vessel is small in size.   Marland Kitchen Second Obtuse Marginal Branch   The vessel is small in size.     Right Coronary Artery   . Prox RCA lesion, 30% stenosed. discrete .       Right Heart Pressures Hemodynamic findings consistent with aortic stenosis. Elevated LV EDP consistent with volume overload.    Left Heart    Aortic Valve There is severe aortic valve stenosis. The aortic valve is calcified.    Coronary Diagrams    Diagnostic Diagram            Implants    Name ID Temporary Type Supply   No information to display    Hemo Data       Most Recent Value   Fick Cardiac Output  6.33 L/min   Fick Cardiac Output Index  3.62 (L/min)/BSA   Aortic Mean Gradient  41.1 mmHg   Aortic Peak Gradient  59 mmHg   Aortic Valve Area  1.35   Aortic Value Area Index  0.77 cm2/BSA   RA A Wave  10 mmHg   RA V Wave  8 mmHg   RA Mean  7 mmHg   RV Systolic Pressure  45 mmHg   RV Diastolic Pressure  3 mmHg   RV EDP  11 mmHg   PA Systolic Pressure  35 mmHg   PA Diastolic Pressure  13 mmHg   PA Mean  23 mmHg   PW A Wave  20 mmHg   PW V Wave  18 mmHg   PW Mean  12 mmHg   AO Systolic Pressure  119 mmHg   AO Diastolic Pressure  55 mmHg   AO Mean  79 mmHg   LV Systolic Pressure  147 mmHg   LV Diastolic Pressure  13 mmHg   LV EDP  21 mmHg   Arterial Occlusion Pressure Extended Systolic Pressure  96 mmHg   Arterial Occlusion Pressure Extended Diastolic Pressure  55 mmHg   Arterial Occlusion Pressure Extended Mean Pressure  73 mmHg   Left Ventricular Apex Extended Systolic Pressure  829 mmHg   Left Ventricular Apex Extended Diastolic Pressure  15 mmHg   Left Ventricular Apex Extended EDP Pressure  20 mmHg   QP/QS  1   TPVR Index  6.36 HRUI   TSVR Index  21.84 HRUI   PVR SVR Ratio   0.15   TPVR/TSVR Ratio  0.29    Order-Level Documents:    There are no order-level documents.    Encounter-Level Documents - 03/22/15:      Scan on 04/12/2015 10:52 AM by Provider Default, MDScan on 04/12/2015 10:52 AM by Provider Default, MD     Scan on 04/12/2015 10:23 AM by Provider Default, MDScan on 04/12/2015 10:23 AM by Provider Default, MD     Scan on 04/11/2015 10:11 AM by Provider Default, MDScan on 04/11/2015 10:11 AM by Provider Default, MD     Electronic signature on 04/11/2015 6:17 AM    Signed    Electronically signed by Burnell Blanks, MD on 04/11/15 at 30 EDT   STS Risk Score:  Risk of Mortality: 6.838%  Morbidity or Mortality: 21.729%  Long Length of Stay: 9.292% Impression:  I have personally reviewed his echo and cardiac cath films.  This 81 year old gentleman has stage D severe symptomatic aortic stenosis with non-obstructive coronary artery disease. I think he would be a high risk candidate for open surgical AVR due to his advanced age. I think TAVR would be a reasonable alternative for him. I discussed the natural history of aortic stenosis with him and his family including alternatives including open surgical AVR, TAVR and palliative medical therapy. He is 89 but still in overall good condition and has been quite active until recently. He would like to proceed with TAVR if he is a candidate to try to improve his quality of life.    Plan:  He will be schedule for TAVR CT workup and then will return to see [place] to review the results and make further plans.   Gaye Pollack, MD

## 2015-04-25 ENCOUNTER — Ambulatory Visit (HOSPITAL_COMMUNITY)
Admission: RE | Admit: 2015-04-25 | Discharge: 2015-04-25 | Disposition: A | Discharge status: Home / Self Care | Payer: Medicare Other | Source: Ambulatory Visit | Attending: Surgery | Admitting: Surgery

## 2015-04-25 ENCOUNTER — Other Ambulatory Visit: Payer: Self-pay | Admitting: Cardiology

## 2015-04-25 ENCOUNTER — Ambulatory Visit (HOSPITAL_COMMUNITY): Payer: Medicare Other

## 2015-04-25 ENCOUNTER — Ambulatory Visit (HOSPITAL_COMMUNITY)
Admission: RE | Admit: 2015-04-25 | Discharge: 2015-04-25 | Disposition: A | Discharge status: Home / Self Care | Payer: Medicare Other | Source: Ambulatory Visit | Attending: Cardiology | Admitting: Cardiology

## 2015-04-25 DIAGNOSIS — I350 Nonrheumatic aortic (valve) stenosis: Secondary | ICD-10-CM | POA: Insufficient documentation

## 2015-04-25 DIAGNOSIS — Z01818 Encounter for other preprocedural examination: Secondary | ICD-10-CM | POA: Diagnosis not present

## 2015-04-25 DIAGNOSIS — I739 Peripheral vascular disease, unspecified: Secondary | ICD-10-CM | POA: Diagnosis not present

## 2015-04-25 MED ORDER — LIDOCAINE HCL 1 % IJ SOLN
INTRAMUSCULAR | Status: AC
Start: 2015-04-25 — End: 2015-04-25
  Filled 2015-04-25: qty 20

## 2015-04-25 MED ORDER — METOPROLOL TARTRATE 1 MG/ML IV SOLN
INTRAVENOUS | Status: AC
Start: 2015-04-25 — End: 2015-04-25
  Filled 2015-04-25: qty 5

## 2015-04-25 MED ORDER — METOPROLOL TARTRATE 1 MG/ML IV SOLN
INTRAVENOUS | Status: AC
  Filled 2015-04-25: qty 5

## 2015-04-25 MED ORDER — IOHEXOL 350 MG/ML SOLN
100.0000 mL | Freq: Once | INTRAVENOUS | Status: AC | PRN
  Administered 2015-04-25: 100 mL via INTRAVENOUS

## 2015-04-25 MED ORDER — IOHEXOL 350 MG/ML SOLN
50.0000 mL | Freq: Once | INTRAVENOUS | Status: AC | PRN
  Administered 2015-04-25: 50 mL via INTRAVENOUS

## 2015-04-25 MED ORDER — METOPROLOL TARTRATE 1 MG/ML IV SOLN
5.0000 mg | INTRAVENOUS | Status: DC | PRN
  Administered 2015-04-25: 5 mg via INTRAVENOUS
  Filled 2015-04-25: qty 5

## 2015-05-03 ENCOUNTER — Encounter: Payer: Self-pay | Admitting: Thoracic Surgery (Cardiothoracic Vascular Surgery)

## 2015-05-03 ENCOUNTER — Encounter: Payer: Self-pay | Admitting: Physical Therapy

## 2015-05-03 ENCOUNTER — Ambulatory Visit: Payer: Medicare Other | Attending: Surgery | Admitting: Physical Therapy

## 2015-05-03 ENCOUNTER — Institutional Professional Consult (permissible substitution) (INDEPENDENT_AMBULATORY_CARE_PROVIDER_SITE_OTHER): Payer: Medicare Other | Admitting: Thoracic Surgery (Cardiothoracic Vascular Surgery)

## 2015-05-03 VITALS — BP 114/68 | HR 90 | Resp 16 | Ht 64.0 in | Wt 155.0 lb

## 2015-05-03 DIAGNOSIS — R911 Solitary pulmonary nodule: Secondary | ICD-10-CM | POA: Diagnosis not present

## 2015-05-03 DIAGNOSIS — I350 Nonrheumatic aortic (valve) stenosis: Secondary | ICD-10-CM

## 2015-05-03 DIAGNOSIS — R262 Difficulty in walking, not elsewhere classified: Secondary | ICD-10-CM | POA: Insufficient documentation

## 2015-05-03 DIAGNOSIS — I714 Abdominal aortic aneurysm, without rupture, unspecified: Secondary | ICD-10-CM

## 2015-05-03 NOTE — Progress Notes (Signed)
[facility] [name] SURGERY CONSULTATION REPORT  Referring Provider is Martinique, Peter M, MD PCP is Jordan Lopes, MD  Chief Complaint  Patient presents with  . Aortic Stenosis    2nd opinion    HPI:  Patient is a 81 year old gentleman with history of aortic stenosis, coronary artery disease status post PCI and stenting in the remote past, status post permanent pacemaker placement for symptomatically braided cardia, hypertension, hyperlipidemia, and GE reflux disease who has been referred for a second surgical opinion to discuss treatment options for management of severe symptomatic aortic stenosis. The patient's cardiac history dates back within 15 years ago when he presented with symptomatic coronary artery disease. He underwent PCI and stenting of the left anterior descending coronary artery in 1999. He was followed for many years by Dr. Doreatha [name] and more recently by Dr. Martinique. He has developed progressive aortic stenosis on serial echocardiograms but has remained asymptomatic until more recently. Over the past 3-6 months the patient has developed progressive exertional fatigue, shortness of breath, and chest discomfort associated with intermittent dizzy spells without syncope. Follow-up transthoracic echocardiogram performed 03/10/2015 reveals severe aortic stenosis with peak velocity across the aortic valve measured 4.1 [name]/s corresponding to mean transvalvular gradient estimated 42 mmHg. Left ventricular systolic function remains normal with ejection fraction estimated 60-65%.  There was severe calcification of the mitral annulus with mild mitral stenosis and mild mitral regurgitation.  The patient underwent diagnostic cardiac catheterization on 04/11/2015 revealing the presence of only mild to moderate non-obstructive coronary artery disease. Catheterization confirmed the presence of severe aortic stenosis with peak to peak and  mean transvalvular gradients measured 59 and 41 mmHg respectively.  Pulmonary artery pressures were very mildly elevated. The patient was referred for surgical consultation and evaluated by Dr. Cyndia [name] on 04/20/2015 who felt the patient would be high-risk for conventional surgical aortic valve replacement because of his severely advanced age and other comorbid medical problems.  The patient has subsequently undergone CT angiography to evaluate the anatomical feasibility of transcatheter aortic valve replacement as an alternative to high-risk conventional surgery. He presents to the office today for a second surgical opinion.  The patient is married and lives locally in [name] with his wife. His wife is elderly and frail and somewhat dependent upon him for her care, although she is fairly mobile on her own. The patient himself has remained physically active and functionally independent all of his life. He still drives and all mobile and was playing golf until approximately 3 months ago. He still tends to chores around the house and works in his yard. He complains that over the last several months he has developed progressive exertional fatigue, shortness of breath, and chest tightness. Symptoms never occur at rest but have progressed to the point where they are now limiting his daily physical activities. He has had occasional dizzy spells without syncope. He has had palpitations. He denies any history of PND, orthopnea, or lower extremity edema. He is accompanied to the office today by his children who also live by and are very supportive.   Past Medical History  Diagnosis Date  . CAD (coronary artery disease)     Remote stent to LAD in 1999. Last cath in 2004 and he is managed medically  . Anemia   . Hyperlipidemia   . Pacemaker     due to bradycardia; placed in 2004  . Aortic stenosis   . BPH (benign prostatic hyperplasia)   .  HTN (hypertension)   . Colon polyps   . Blood transfusion   . GERD  (gastroesophageal reflux disease)   . Hemorrhoid   . Complication of anesthesia   . Dysrhythmia     paced  . Arthritis 09/18/11    "in my knees"  . Basal cell carcinoma of skin   . AAA (abdominal aortic aneurysm)   . Lung nodule     RLL  . Renal mass, left     Past Surgical History  Procedure Laterality Date  . Nasal hemorrhage control  1980's  . Insert / replace / remove pacemaker  2004    initial placement; [employer]  . Insert / replace / remove pacemaker  02/14/2011  . Cardiac catheterization  2004    Managed medically  . Coronary angioplasty with stent placement  05/09/1998    "1"; LAD  . Tonsillectomy      "when I was a teenager"  . Skin cancer excision  09/18/11    "have had a couple hundred of them removed since I was in my 64's"  . Cardiac catheterization    . Coronary angiogram  09/19/2011    Procedure: CORONARY ANGIOGRAM;  Surgeon: Jordan Hector, MD;  Location: [facility] CATH LAB;  Service: Cardiovascular;;  . Cardiac catheterization N/A 04/11/2015    Procedure: Right/Left Jordan Cath and Coronary Angiography;  Surgeon: Jordan Blanks, MD;  Location: [facility] LAB;  Service: Cardiovascular;  Laterality: N/A;    Family History  Problem Relation Age of Onset  . Cancer Father     stomach  . Colon cancer Father   . Jordan disease Brother   . Jordan disease Brother   . Diabetes Other     granddaughter    Social History   Social History  . Marital Status: Married    Spouse Name: N/A  . Number of Children: 2  . Years of Education: N/A   Occupational History  . Retired     Building control surveyor   Social History Main Topics  . Smoking status: Former Smoker -- 1.00 packs/day for 42 years    Types: Cigarettes    Quit date: 09/15/1973  . Smokeless tobacco: Former Systems developer    Types: Chew  . Alcohol Use: No  . Drug Use: No  . Sexual Activity: Yes   Other Topics Concern  . Not on file   Social History Narrative    Current Outpatient Prescriptions  Medication Sig Dispense  Refill  . acetaminophen (TYLENOL) 325 MG tablet Take 650 mg by mouth every 6 (six) hours as needed for mild pain, moderate pain or fever.     Marland Kitchen amLODipine (NORVASC) 5 MG tablet Take 1 tablet (5 mg total) by mouth daily. 30 tablet 2  . clopidogrel (PLAVIX) 75 MG tablet Take 75 mg by mouth daily.      Marland Kitchen esomeprazole (NEXIUM) 40 MG capsule Take 40 mg by mouth daily.    . ferrous sulfate 325 (65 FE) MG tablet Take 325 mg by mouth every evening.     . rosuvastatin (CRESTOR) 5 MG tablet Take 5 mg by mouth daily.    . silodosin (RAPAFLO) 4 MG CAPS capsule Take 4 mg by mouth every evening.      No current facility-administered medications for this visit.    Allergies  Allergen Reactions  . Aspirin Other (See Comments)    Stomach bleeds      Review of Systems:   General:  normal appetite, decreased energy, no weight gain,  no weight loss, no fever  Cardiac:  + chest pain with exertion, no chest pain at rest, + SOB with exertion, no resting SOB, no PND, no orthopnea, + palpitations, no arrhythmia, no atrial fibrillation, no LE edema, + dizzy spells, no syncope  Respiratory:  exertional shortness of breath, no home oxygen, no productive cough, no dry cough, no bronchitis, no wheezing, no hemoptysis, no asthma, no pain with inspiration or cough, no sleep apnea, no CPAP at night  GI:   no difficulty swallowing, no reflux, no frequent heartburn, no hiatal hernia, no abdominal pain, no constipation, no diarrhea, no hematochezia, no hematemesis, no melena  GU:   no dysuria,  + frequency, no urinary tract infection, no hematuria, + enlarged prostate, no kidney stones, no kidney disease  Vascular:  no pain suggestive of claudication, no pain in feet, no leg cramps, no varicose veins, no DVT, no non-healing foot ulcer  Neuro:   no stroke, no TIA's, no seizures, no headaches, no temporary blindness one eye,  no slurred speech, no peripheral neuropathy, no chronic pain, no instability of gait, no  memory/cognitive dysfunction  Musculoskeletal: mild arthritis in both knees, no joint swelling, no myalgias, no difficulty walking, normal mobility   Skin:   no rash, no itching, no skin infections, no pressure sores or ulcerations  Psych:   no anxiety, + depression, no nervousness, no unusual recent stress  Eyes:   + blurry vision, no floaters, no recent vision changes, + wears glasses or contacts  ENT:   + hearing loss, no loose or painful teeth, + upper plate dentures, last saw dentist > 6 years ago  Hematologic:  + easy bruising, no abnormal bleeding, no clotting disorder, no frequent epistaxis  Endocrine:  no diabetes, does not check CBG's at home           Physical Exam:   BP 114/68 mmHg  Pulse 90  Resp 16  Ht 5\' 4"  (1.626 [name])  Wt 155 lb (70.308 kg)  BMI 26.59 kg/m2  SpO2 96%  General:  Elderly and somewhat frail but o/w  well-appearing  HEENT:  Unremarkable   Neck:   no JVD, no bruits, no adenopathy   Chest:   clear to auscultation, symmetrical breath sounds, no wheezes, no rhonchi   CV:   RRR, grade IV/VI crescendo/decrescendo murmur heard best at RUSB,  no diastolic murmur  Abdomen:  soft, non-tender, no masses   Extremities:  warm, well-perfused, pulses palpable, no LE edema  Rectal/GU  Deferred  Neuro:   Grossly non-focal and symmetrical throughout  Skin:   Clean and dry, no rashes, no breakdown   Diagnostic Tests:  Transthoracic Echocardiography  Patient:  Jordan [name], Jordan [name] MR #:    [id number] Study Date: 03/10/2015 Gender:   [name] Age:    23 Height:   162.6 cm Weight:   69.9 kg BSA:    1.79 [name]^2 Pt. Status: Room:    [address], Linward Foster [address] [place] [address] [place], [place] PERFORMING  [facility], Inpatient SONOGRAPHER Minus Breeding  cc:  ------------------------------------------------------------------- LV EF: 60% -   65%  ------------------------------------------------------------------- Indications:   Chest pain 786.51.  ------------------------------------------------------------------- History:  PMH:  Coronary artery disease. Aortic valve disease. Risk factors: Dyslipidemia.  ------------------------------------------------------------------- Study Conclusions  - Left ventricle: The cavity size was normal. Wall thickness was increased in a pattern of mild LVH. Systolic function was normal. The estimated ejection fraction was in  the range of 60% to 65%. Wall motion was normal; there were no regional wall motion abnormalities. Doppler parameters are consistent with abnormal left ventricular relaxation (grade 1 diastolic dysfunction). - Aortic valve: Moderately calcified annulus. Trileaflet; moderately thickened leaflets. There was severe stenosis. Mean gradient (S): 42 mm Hg. VTI ratio of LVOT to aortic valve: 0.26. Valve area (VTI): 0.74 cm^2. Valve area (Vmax): 0.77 cm^2. Valve area (Vmean): 0.66 cm^2. - Mitral valve: Moderately calcified annulus. Moderately thickened leaflets . The findings are consistent with mild stenosis. There was mild regurgitation. Valve area by continuity equation (using LVOT flow): 2.14 cm^2. - Left atrium: The atrium was mildly dilated. - Technically difficult study.  ------------------------------------------------------------------- Labs, prior tests, procedures, and surgery: Permanent pacemaker system implantation.  Transthoracic echocardiography. [name]-mode, complete 2D, spectral Doppler, and color Doppler. Birthdate: Patient birthdate: 1918/09/09. Age: Patient is 82 yr old. Sex: Gender: male. BMI: 26.4 kg/[name]^2. Blood pressure:   128/70 Patient status: Inpatient. Study date: Study date: 03/10/2015. Study time: 07:59 AM. Location:  Bedside.  -------------------------------------------------------------------  ------------------------------------------------------------------- Left ventricle: The cavity size was normal. Wall thickness was increased in a pattern of mild LVH. Systolic function was normal. The estimated ejection fraction was in the range of 60% to 65%. Wall motion was normal; there were no regional wall motion abnormalities. Doppler parameters are consistent with abnormal left ventricular relaxation (grade 1 diastolic dysfunction).  ------------------------------------------------------------------- Aortic valve:  Moderately calcified annulus. Trileaflet; moderately thickened leaflets. Doppler:  There was severe stenosis.  There was no significant regurgitation.  VTI ratio of LVOT to aortic valve: 0.26. Valve area (VTI): 0.74 cm^2. Indexed valve area (VTI): 0.41 cm^2/[name]^2. Peak velocity ratio of LVOT to aortic valve: 0.27. Valve area (Vmax): 0.77 cm^2. Indexed valve area (Vmax): 0.43 cm^2/[name]^2. Mean velocity ratio of LVOT to aortic valve: 0.26. Valve area (Vmean): 0.66 cm^2. Indexed valve area (Vmean): 0.37 cm^2/[name]^2.  Mean gradient (S): 42 mm Hg. Peak gradient (S): 69 mm Hg.  ------------------------------------------------------------------- Aorta: Aortic root: The aortic root was normal in size.  ------------------------------------------------------------------- Mitral valve:  Moderately calcified annulus. Moderately thickened leaflets . Doppler:  The findings are consistent with mild stenosis.  There was mild regurgitation.  Valve area by continuity equation (using LVOT flow): 2.14 cm^2. Indexed valve area by continuity equation (using LVOT flow): 1.19 cm^2/[name]^2. Mean gradient (D): 4 mm Hg. Peak gradient (D): 4 mm Hg.  ------------------------------------------------------------------- Left atrium: The atrium was mildly  dilated.  ------------------------------------------------------------------- Right ventricle: The cavity size was normal. Wall thickness was normal. Systolic function was normal.  ------------------------------------------------------------------- Pulmonic valve:  Not well visualized. Doppler:  There was no evidence for stenosis.  There was mild regurgitation.  ------------------------------------------------------------------- Tricuspid valve: Not well visualized. Doppler:  There was no evidence for stenosis.  There was mild regurgitation.  ------------------------------------------------------------------- Pulmonary artery:  Systolic pressure could not be accurately estimated.  Inadequate TR jet.  ------------------------------------------------------------------- Right atrium: The atrium was normal in size.  ------------------------------------------------------------------- Pericardium: There was no pericardial effusion.  ------------------------------------------------------------------- Systemic veins: IVC is poorly visualized.  ------------------------------------------------------------------- Measurements  Left ventricle              Value      Reference LV ID, ED, PLAX chordal     (L)   31.2  mm    43 - 52 LV ID, ES, PLAX chordal     (L)   22.3  mm    23 - 38 LV fx shortening, PLAX chordal      29   %    >=  29 LV PW thickness, ED           11   mm    --------- IVS/LV PW ratio, ED           0.09      <=1.3  Ventricular septum            Value      Reference IVS thickness, ED            1   mm    ---------  LVOT                   Value      Reference LVOT ID, S                18   mm    --------- LVOT area                2.54  cm^2   --------- LVOT peak  velocity, S          111  cm/s   --------- LVOT mean velocity, S          80.5  cm/s   --------- LVOT VTI, S               25   cm    ---------  Aortic valve               Value      Reference Aortic valve peak velocity, S      415.9 cm/s   --------- Aortic valve mean velocity, S      308.43 cm/s   --------- Aortic valve VTI, S           94.48 cm    --------- Aortic mean gradient, S         42   mm Hg  --------- Aortic peak gradient, S         69   mm Hg  --------- VTI ratio, LVOT/AV            0.26      --------- Aortic valve area, VTI          0.74  cm^2   --------- Aortic valve area/bsa, VTI        0.41  cm^2/[name]^2 --------- Velocity ratio, peak, LVOT/AV      0.27      --------- Aortic valve area, peak velocity     0.77  cm^2   --------- Aortic valve area/bsa, peak       0.43  cm^2/[name]^2 --------- velocity Velocity ratio, mean, LVOT/AV      0.26      --------- Aortic valve area, mean velocity     0.66  cm^2   --------- Aortic valve area/bsa, mean       0.37  cm^2/[name]^2 --------- velocity  Aorta                  Value      Reference Aortic root ID, ED            32   mm    ---------  Left atrium               Value      Reference LA ID, A-P, ES              35   mm    --------- LA ID/bsa, A-P  1.95  cm/[name]^2  <=2.2  Mitral valve               Value      Reference Mitral E-wave peak velocity       101  cm/s   --------- Mitral A-wave peak velocity       157  cm/s   --------- Mitral mean velocity, D         87.76 cm/s   --------- Mitral deceleration time     (H)   437  ms    150 -  230 Mitral mean gradient, D         4   mm Hg  --------- Mitral peak gradient, D         4   mm Hg  --------- Mitral E/A ratio, peak          0.6       --------- Mitral valve area, LVOT         2.14  cm^2   --------- continuity Mitral valve area/bsa, LVOT       1.19  cm^2/[name]^2 --------- continuity  Systemic veins              Value      Reference Estimated CVP              3   mm Hg  ---------  Right ventricle             Value      Reference RV s&', lateral, S            11.5  cm/s   ---------  Legend: (L) and (H) mark values outside specified reference range.  ------------------------------------------------------------------- Prepared and Electronically Authenticated by  Kerry Hough, [name].D. 2016-06-25T13:46:39   CARDIAC CATHETERIZATION  Conclusion     Prox RCA lesion, 30% stenosed.  Prox LAD lesion, 40% stenosed.  Prox Cx lesion, 30% stenosed.  Severe aortic valve stenosis  1. Mild to moderate non-obstructive CAD  2. Severe aortic valve stenosis (mean gradient 41 mmHg, Peak to peak 59 mm Hg), AVA 1.35 cm2  Of note, this was a very difficult procedure with use of many catheters to attempt selective coronary engagement which was limited due to calcification and tortuosity of vessels.   Recommendations: He has severe AS. His CAD is stable. Will refer to see Dr. Cyndia [name] or Dr. Roxy Manns to discuss options for TAVR. If he is felt to be a good candidate, will proceed with CT scans and PFTs.      Coronary Findings    Dominance: Right   Left Anterior Descending   . Prox LAD lesion, 40% stenosed. diffuse .     Ramus Intermedius  The vessel is small .     Left Circumflex   . Prox Cx lesion, 30% stenosed. discrete .   Marland Kitchen First Obtuse Marginal Branch   The vessel is small in size.   Marland Kitchen Second Obtuse Marginal Branch    The vessel is small in size.     Right Coronary Artery   . Prox RCA lesion, 30% stenosed. discrete .       Right Jordan Pressures Hemodynamic findings consistent with aortic stenosis. Elevated LV EDP consistent with volume overload.    Left Jordan    Aortic Valve There is severe aortic valve stenosis. The aortic valve is calcified.    Coronary Diagrams    Diagnostic Diagram            Implants  Name ID Temporary Type Supply   No information to display    Hemo Data       Most Recent Value   Fick Cardiac Output  6.33 L/min   Fick Cardiac Output Index  3.62 (L/min)/BSA   Aortic Mean Gradient  41.1 mmHg   Aortic Peak Gradient  59 mmHg   Aortic Valve Area  1.35   Aortic Value Area Index  0.77 cm2/BSA   RA A Wave  10 mmHg   RA V Wave  8 mmHg   RA Mean  7 mmHg   RV Systolic Pressure  45 mmHg   RV Diastolic Pressure  3 mmHg   RV EDP  11 mmHg   PA Systolic Pressure  35 mmHg   PA Diastolic Pressure  13 mmHg   PA Mean  23 mmHg   PW A Wave  20 mmHg   PW V Wave  18 mmHg   PW Mean  12 mmHg   AO Systolic Pressure  315 mmHg   AO Diastolic Pressure  55 mmHg   AO Mean  79 mmHg   LV Systolic Pressure  176 mmHg   LV Diastolic Pressure  13 mmHg   LV EDP  21 mmHg   Arterial Occlusion Pressure Extended Systolic Pressure  96 mmHg   Arterial Occlusion Pressure Extended Diastolic Pressure  55 mmHg   Arterial Occlusion Pressure Extended Mean Pressure  73 mmHg   Left Ventricular Apex Extended Systolic Pressure  160 mmHg   Left Ventricular Apex Extended Diastolic Pressure  15 mmHg   Left Ventricular Apex Extended EDP Pressure  20 mmHg   QP/QS  1   TPVR Index  6.36 HRUI   TSVR Index  21.84 HRUI   PVR SVR Ratio  0.15   TPVR/TSVR Ratio  0.29    Cardiac TAVR CT  TECHNIQUE: The patient was scanned on a Philips 256 scanner. A 120 kV retrospective scan was triggered in the descending thoracic aorta at 111 HU's. Gantry rotation speed  was 270 msecs and collimation was .9 mm. 5 mg of iv Metoprolol and no nitro were given. The 3D data set was reconstructed in 5% intervals of the R-R cycle. Systolic and diastolic phases were analyzed on a dedicated work station using MPR, MIP and VRT modes. The patient received 80 cc of contrast.  FINDINGS: Aortic Valve: Severely thickened and calcified with severely restricted leaflet opening. There are only mild sub-valvular calcifications.  Aorta: Normal caliber, no dissection. There are mild diffuse calcifications in the ascending and descending aorta and moderate diffuse calcifications on the aortic arch.  Sinotubular Junction: 30 x 30 mm  Ascending Thoracic Aorta: 37 x 36 mm  Aortic Arch: 28 x 26 mm  Descending Thoracic Aorta: 28 x 28 mm  Sinus of Valsalva Measurements:  Non-coronary: 33 mm  Right -coronary: 35 mm  Left -coronary: 33 mm  Coronary Artery Height above Annulus:  Left Main: 11 mm  Right Coronary: 15 mm  Virtual Basal Annulus Measurements:  Maximum/Minimum Diameter: 440 mm2  Perimeter: 89 mm  Area: 26 x 22 mm  Optimum Fluoroscopic Angle for Delivery: LAO 1 CAU 1  IMPRESSION: 1. Severely thickened and calcified aortic valve with severely restricted leaflet opening and annular measurement suitable for deployment of 26 mm Edward-SAPIEN 3 valve.  2. Sufficient annulus to coronary distance.  3. Optimum Fluoroscopic Angle for Delivery is LAO 1 CAU 1.  Jordan [name]   Electronically Signed  By: Jordan [name]  On: 04/27/2015 12:24  Study Result     CLINICAL DATA: 81 year old male with history of severe aortic stenosis. Preprocedural study prior to potential transcatheter aortic valve replacement (TAVR) procedure.  EXAM: CT ANGIOGRAPHY CHEST, ABDOMEN AND PELVIS  TECHNIQUE: Multidetector CT imaging through the chest, abdomen and pelvis was performed using the standard protocol  during bolus administration of intravenous contrast. Multiplanar reconstructed images and MIPs were obtained and reviewed to evaluate the vascular anatomy.  CONTRAST: 58mL OMNIPAQUE IOHEXOL 350 MG/ML SOLN  COMPARISON: CT of the abdomen and pelvis 07/11/2014.  FINDINGS: CTA CHEST FINDINGS  Mediastinum/Lymph Nodes: Jordan size is borderline enlarged. There is no significant pericardial fluid, thickening or pericardial calcification. Severe thickening calcifications of the aortic valve. Severe thickening calcifications of the mitral valve and mitral annulus. There is atherosclerosis of the thoracic aorta, the great vessels of the mediastinum and the coronary arteries, including calcified atherosclerotic plaque in the left main, left anterior descending, left circumflex and right coronary arteries. Right-sided pacemaker device in position with lead tips terminating in the right atrium and right ventricular apex. No pathologically enlarged mediastinal or hilar lymph nodes. Esophagus is normal in appearance. No axillary lymphadenopathy.  Lungs/Pleura: 9 mm right lower lobe nodule (image 40 of series 403), unchanged compared to prior study 01/13/2013, considered benign. Calcified granuloma in the right lower lobe peripherally. No other suspicious appearing pulmonary nodules or masses. No acute consolidative airspace disease. No pleural effusions. Small calcified pleural plaques are noted throughout the thorax bilaterally, suggesting underlying asbestos related pleural disease. No fibrotic changes are noted in the lungs to suggest asbestosis at this time.  Musculoskeletal/Soft Tissues: There are no aggressive appearing lytic or blastic lesions noted in the visualized portions of the skeleton.  CTA ABDOMEN AND PELVIS FINDINGS  Hepatobiliary: No cystic or solid hepatic lesions. No intra or extrahepatic biliary ductal dilatation. Tiny calcified gallstones layer dependently in  the gallbladder. No current findings to suggest an acute cholecystitis at this time.  Pancreas: No pancreatic mass. No pancreatic ductal dilatation. No pancreatic or peripancreatic fluid or inflammatory changes.  Spleen: Unremarkable.  Adrenals/Urinary Tract: Slight interval enlargement of an exophytic enhancing lesion in the lower pole of the left kidney (image 175 of series 401), which measures 2.0 x 2.4 cm, concerning for a small renal cell carcinoma. Multiple other low-attenuation lesions are noted in the kidneys bilaterally, similar in size, number and distribution to prior examinations. The smallest of these are too small to characterize, but are statistically favored to represent tiny cysts. The largest of these lesions are all compatible with simple cysts, including the largest lesion which measures 2.4 cm in the interpolar region of the right kidney. Bilateral adrenal glands are normal in appearance. No hydroureteronephrosis. Urinary bladder is remarkable for innumerable bladder diverticulae.  Stomach/Bowel: The appearance of the stomach is normal. No pathologic dilatation of small bowel or colon. Several colonic diverticulae are noted, particularly in the sigmoid colon, without surrounding inflammatory changes to suggest an acute diverticulitis at this time.  Vascular/Lymphatic: Vascular findings and measurements pertinent to potential TAVR procedure, as detailed below. In addition, there is a focal fusiform aneurysmal dilatation of the infrarenal abdominal aorta which measures up to 4.5 x 4.1 cm (unchanged compared with 07/11/2014). No lymphadenopathy noted in the abdomen or pelvis.  Reproductive: Prostate gland is enlarged and heterogeneous appearing measuring 5.6 x 6.0 cm. Seminal vesicles are unremarkable in appearance.  Other: No significant volume of ascites. No pneumoperitoneum.  Musculoskeletal: There are no aggressive appearing lytic or  blastic lesions noted in  the visualized portions of the skeleton.  VASCULAR MEASUREMENTS PERTINENT TO TAVR:  AORTA:  Minimal Aortic Diameter - 18 x 20 mm  Severity of Aortic Calcification - moderate to severe  RIGHT PELVIS:  Right Common Iliac Artery -  Minimal Diameter - 10.3 x 9.7 mm  Tortuosity - severe  Calcification - moderate  Right External Iliac Artery -  Minimal Diameter - 8.8 x 6.9 mm  Tortuosity - moderate  Calcification - mild to moderate  Right Common Femoral Artery -  Minimal Diameter - 8.9 x 3.8 mm large eccentric calcified plaque protruding into the lumen causing focal narrowing  Tortuosity - mild  Calcification - moderate to severe  LEFT PELVIS:  Left Common Iliac Artery -  Minimal Diameter - 10.5 x 11.1 mm  Tortuosity - moderate  Calcification - moderate  Left External Iliac Artery -  Minimal Diameter - 8.9 x 8.8 mm  Tortuosity - mild  Calcification - mild  Left Common Femoral Artery -  Minimal Diameter - 7.3 x 7.2 mm  Tortuosity - mild  Calcification - moderate  Review of the MIP images confirms the above findings.  IMPRESSION: 1. Vascular findings and measurements pertinent to potential TAVR procedure, as detailed above. This patient does appear to have suitable pelvic arterial access from the left side. On the right side, the combination of extensive calcified atheromatous plaque which distorts the right common femoral artery and extreme tortuosity of the right common iliac artery prevent appropriate vascular access for this procedure. 2. Extensive thickening calcification of the aortic valve, compatible with the reported clinical history of aortic stenosis. There is also severe thickening calcification of the mitral valve and mitral annulus. 3. 4.1 x 4.5 cm fusiform infrarenal abdominal aortic aneurysm is unchanged in size compared to prior examinations. 4. Interval enlargement of a  2.0 x 2.4 cm enhancing lesion in the lower pole of the left kidney again highly concerning for renal cell carcinoma. 5. Calcified pleural plaques in the thorax bilaterally, suggesting underlying asbestos related pleural disease. No findings in the lungs to suggest asbestosis at this time. 6. 9 mm right lower lobe nodules unchanged compared to 01/13/2013, considered benign, presumably a noncalcified granuloma. 7. Additional incidental findings, as above.  Electronically Signed: By: Vinnie Langton [name].D. On: 04/25/2015 13:36       Pulmonary Function Tests        FVC  2.09 L  (93% predicted)  FEV1  1.66 L  (119% predicted)  FEF25-75 1.58 L  (254% predicted)  TLC  4.42 L  (77% predicted) RV  2.05 L  (79% predicted) DLCO  76% predicted   STS Risk Calculator  Procedure    AVR  Risk of Mortality   5.9% Morbidity or Mortality  24.4% Prolonged LOS   11.1% Short LOS    17.6% Permanent Stroke   2.6% Prolonged Vent Support  13.8% DSW Infection    0.2% Renal Failure    8.0% Reoperation    10.0%    Impression:  Patient has stage D severe symptomatic aortic stenosis. He presents with progressive symptoms of exertional shortness of breath and fatigue consistent with chronic diastolic congestive Jordan failure, [place] Jordan Association functional class II and additional symptoms of exertional chest tightness consistent with angina pectoris and recent onset dizzy spells without syncope.  I have personally reviewed the patient's recent transthoracic echocardiogram, diagnostic cardiac catheterization, and CT angiograms. Transthoracic echocardiogram confirmed the presence of severe calcific aortic stenosis with severe thickening and calcification and restricted leaflet  mobility involving all 3 leaflets of the patient's aortic valve. Peak velocity across the aortic valve measured in excess of 4 [name]/s corresponding to mean transvalvular gradient estimated 42 mmHg. Left ventricular systolic  function remains preserved with ejection fraction estimated 60%. Diagnostic cardiac catheterization is notable for the absence of flow limiting coronary artery disease. I agree that conventional surgical aortic valve replacement would be high risk in this patient, primarily because of his severely advanced age.  Cardiac gated CT angiogram of the Jordan demonstrates anatomical findings favorable for transcatheter aortic valve replacement using a 26 mm Edwards Sapien 3 transcatheter Jordan valve without any complicating features. The patient appears to have adequate pelvic vascular access for transfemoral approach, although the patient does have a small infrarenal abdominal aortic aneurysm, tortuous common iliac arteries bilaterally, and significant calcification and disease involving both femoral arteries. Overall I feel that transcatheter aortic valve replacement would be a far less invasive and potentially less risky alternatives to conventional surgical aortic valve replacement.   Plan:  The patient and his children were counseled at length regarding treatment alternatives for management of severe symptomatic aortic stenosis. Alternative approaches such as conventional aortic valve replacement, transcatheter aortic valve replacement, and palliative medical therapy were compared and contrasted at length.  The risks associated with conventional surgical aortic valve replacement were been discussed in detail, as were expectations for post-operative convalescence. Long-term prognosis with medical therapy was discussed. This discussion was placed in the context of the patient's own specific clinical presentation and past medical history.  All of their questions been addressed.  The patient hopes to proceed with TAVR in the near future.  We tentatively plan to proceed with transcatheter aortic valve replacement via open transfemoral approach on Tuesday, 05/15/2015. The patient has been instructed to stop taking  Plavix 7 days prior to surgery. He will continue all other medications without change. The patient has been instructed to schedule an appointment with his dentist for routine dental exam and cleaning between now and the time of surgery.  Following the decision to proceed with transcatheter aortic valve replacement, a discussion has been held regarding what types of management strategies would be attempted intraoperatively in the event of life-threatening complications, including whether or not the patient would be considered a candidate for the use of cardiopulmonary bypass and/or conversion to open sternotomy for attempted surgical intervention.  The patient has been advised of a variety of complications that might develop including but not limited to risks of death, stroke, paravalvular leak, aortic dissection or other major vascular complications, aortic annulus rupture, device embolization, cardiac rupture or perforation, mitral regurgitation, acute myocardial infarction, arrhythmia, Jordan block or bradycardia requiring permanent pacemaker placement, congestive Jordan failure, respiratory failure, renal failure, pneumonia, infection, other late complications related to structural valve deterioration or migration, or other complications that might ultimately cause a temporary or permanent loss of functional independence or other long term morbidity.  The patient provides full informed consent for the procedure as described and all questions were answered.      Valentina Gu. Roxy Manns, MD 05/03/2015 5:17 PM

## 2015-05-03 NOTE — Therapy (Signed)
[place], [place], [address] Phone: [phone]   Fax:  [phone]  Physical Therapy Evaluation  Patient Details  Name: Jordan [name] MRN: [id number] Date of Birth: 09-08-1918 Referring Provider:  Gaye Pollack, MD  Encounter Date: 05/03/2015      PT End of Session - 05/03/15 1443    Visit Number 1   PT Start Time 6767   PT Stop Time 2094   PT Time Calculation (min) 44 min      Past Medical History  Diagnosis Date  . CAD (coronary artery disease)     Remote stent to LAD in 1999. Last cath in 2004 and he is managed medically  . Anemia   . Hyperlipidemia   . Pacemaker     due to bradycardia; placed in 2004  . Aortic stenosis   . BPH (benign prostatic hyperplasia)   . HTN (hypertension)   . Colon polyps   . Blood transfusion   . GERD (gastroesophageal reflux disease)   . Hemorrhoid   . Complication of anesthesia   . Dysrhythmia     paced  . Arthritis 09/18/11    "in my knees"  . Basal cell carcinoma of skin   . AAA (abdominal aortic aneurysm)   . Lung nodule     RLL  . Renal mass, left     Past Surgical History  Procedure Laterality Date  . Nasal hemorrhage control  1980's  . Insert / replace / remove pacemaker  2004    initial placement; [employer]  . Insert / replace / remove pacemaker  02/14/2011  . Cardiac catheterization  2004    Managed medically  . Coronary angioplasty with stent placement  05/09/1998    "1"; LAD  . Tonsillectomy      "when I was a teenager"  . Skin cancer excision  09/18/11    "have had a couple hundred of them removed since I was in my 53's"  . Cardiac catheterization    . Coronary angiogram  09/19/2011    Procedure: CORONARY ANGIOGRAM;  Surgeon: Josue Hector, MD;  Location: [facility] CATH LAB;  Service: Cardiovascular;;  . Cardiac catheterization N/A 04/11/2015    Procedure: Right/Left Heart Cath and Coronary Angiography;  Surgeon: Burnell Blanks, MD;   Location: [facility] CV LAB;  Service: Cardiovascular;  Laterality: N/A;    There were no vitals filed for this visit.  Visit Diagnosis:  Difficulty walking - Plan: PT plan of care cert/re-cert  Severe aortic stenosis - Plan: PT plan of care cert/re-cert      Subjective Assessment - 05/03/15 1449    Subjective Pt reports up to about 3-4 months ago while pulling the garbage can up, he experienced near syncopal episode where he just sat down in the utility room and had to rest for a bit. A few days later, he followed up with Dr. Martinique (cardiologist). Pt reports he had similar episodes over the previous months while playing golf.  Pt has also noticed increased fatigue over past several months. Pt denies chest pain or SOB. Pt has not been able to play golf x 3-  25months.    Patient Stated Goals return to golf   Currently in Pain? No/denies            [facility] PT Assessment - 05/03/15 0001    Assessment   Medical Diagnosis severe aortic stneosis   Onset Date/Surgical Date 01/01/15  approximate   Precautions  Precautions None   Restrictions   Weight Bearing Restrictions No   Balance Screen   Has the patient fallen in the past 6 months No   Has the patient had a decrease in activity level because of a fear of falling?  No   Is the patient reluctant to leave their home because of a fear of falling?  No   Home Environment   Living Environment Private residence   Living Arrangements Spouse/significant other   Home Access Stairs to enter   Entrance Stairs-Number of Steps 4-5   [place] One level   Additional Comments Pt is primary caregiver for wife - bathing, dressing, meal prep.   Prior Function   Level of Independence Independent  playing golf   Posture/Postural Control   Posture/Postural Control Postural limitations   Postural Limitations Forward head;Rounded Shoulders   ROM / Strength   AROM / PROM / Strength AROM;Strength   AROM   Overall  AROM  Within functional limits for tasks performed   Strength   Overall Strength Comments grossly 5/5 throughout   Strength Assessment Site Hand   Right/Left hand Right;Left   Right Hand Grip (lbs) 40  R hand dominant but limited due to R hand swelling   Left Hand Grip (lbs) 54   Ambulation/Gait   Gait Comments Pt amb. without device without any significant deviations or unsteadiness.          [facility] Pre-Surgical Assessment - 05/03/15 0001    5 Meter Walk Test- trial 1 6 sec   5 Meter Walk Test- trial 2 5 sec.    5 Meter Walk Test- trial 3 5 sec.  </= 6 seconds WNL   5 meter walk test average 5.33 sec   Timed Up & Go Test trial  11 sec.   Comments </= 12 sec WNL   4 Stage Balance Test tolerated for:  10 sec.   4 Stage Balance Test Position 2   comment inability to hold position 3 x 10 sec indiciates incr. fall risk   Sit To Stand Test- trial 1 11 sec.   Comment </= 14.8 WNL for age   ADL/IADL Independent with: Bathing;Dressing;Meal prep;Finances   ADL/IADL Needs Assistance with: Valla Leaver work   ADL/IADL Fraility Index Vulnerable   6 Minute Walk- Baseline yes   BP (mmHg) 128/68 mmHg   HR (bpm) 72   02 Sat (%RA) 95 %   Modified Borg Scale for Dyspnea 0- Nothing at all   Perceived Rate of Exertion (Borg) 6-   6 Minute Walk Post Test yes   BP (mmHg) 128/70 mmHg   HR (bpm) 90   02 Sat (%RA) 96 %   Modified Borg Scale for Dyspnea 0- Nothing at all  demo's mild to moderate SOB   Perceived Rate of Exertion (Borg) 13- Somewhat hard   Aerobic Endurance Distance Walked 538   Endurance additional comments Pt amb. 360' in 2:25 before requiring seated rest due to "pressure in chest" which resolved with rest. HR 111 bpm and O2 97% - rested 1:50 before resuming. Pt able to ambulate additional 1:15 and 178' before requiring second and final seated rest with 30 sec remaining..                                     Plan - 05/03/15 1531    Clinical Impression  Statement Pt is  a 81 yo male presenting to OP PT for evaluation prior to possible TAVR surgery due to severe aortic stenosis. Pt reports symptoms magnified approximately 3-4 months ago at which point he had to stop playing golf. Primary symptoms are fatigue and pressure in chest. Pt denies SOB/chest pain. Pt presents with good strength, fair to good balance, and moderately to severely limited aerobic endurance.    PT Frequency One time visit   Consulted and Agree with Plan of Care Patient;Family member/caregiver          G-Codes - 2015/05/04 1534    Functional Assessment Tool Used 6 minute walk 538 feet   Functional Limitation Mobility: Walking and moving around   Mobility: Walking and Moving Around Current Status [phone]) At least 40 percent but less than 60 percent impaired, limited or restricted   Mobility: Walking and Moving Around Goal Status [phone]) At least 40 percent but less than 60 percent impaired, limited or restricted   Mobility: Walking and Moving Around Discharge Status [phone]) At least 40 percent but less than 60 percent impaired, limited or restricted       Problem List Patient Active Problem List   Diagnosis Date Noted  . Chest pain 03/09/2015  . Abdominal aneurysm without mention of rupture 02/24/2013  . Pulmonary nodule, right 02/10/2013  . Mobitz type II atrioventricular block 03/09/2012  . Effusion of olecranon bursa, left 01/28/2012  . Aortic stenosis 05/06/2011  . Cardiac pacemaker in situ 10/29/2010  . ABDOMINAL PAIN-LUQ 06/21/2008  . PERSONAL HX COLONIC POLYPS 06/21/2008  . HYPERLIPIDEMIA 06/19/2008  . ANEMIA 06/19/2008  . Coronary atherosclerosis 06/19/2008    [place], PT 05/04/2015, 3:37 PM  [facility] [address] [place], [place], [address] Phone: [phone]   Fax:  [phone]

## 2015-05-03 NOTE — Patient Instructions (Addendum)
Schedule an appointment with your dentist for routine cleaning and exam ASAP.  If your dentist cannot schedule an appointment then call [facility] to request consultation with the [place] [facility]   Patient has been instructed to stop taking Plavix 7 days prior to your surgery  Patient should continue taking all other medications without change through the day before surgery.  Patient should have nothing to eat or drink after midnight the night before surgery.  On the morning of surgery patient should take only Nexium and Norvasc with a sip of water.

## 2015-05-04 ENCOUNTER — Other Ambulatory Visit: Payer: Self-pay | Admitting: *Deleted

## 2015-05-04 DIAGNOSIS — I350 Nonrheumatic aortic (valve) stenosis: Secondary | ICD-10-CM

## 2015-05-07 ENCOUNTER — Encounter (HOSPITAL_COMMUNITY): Payer: Self-pay | Admitting: Dentistry

## 2015-05-07 ENCOUNTER — Ambulatory Visit (HOSPITAL_COMMUNITY): Payer: Self-pay | Admitting: Dentistry

## 2015-05-07 VITALS — BP 139/61 | HR 86 | Temp 97.9°F

## 2015-05-07 DIAGNOSIS — K08109 Complete loss of teeth, unspecified cause, unspecified class: Secondary | ICD-10-CM

## 2015-05-07 DIAGNOSIS — K036 Deposits [accretions] on teeth: Secondary | ICD-10-CM

## 2015-05-07 DIAGNOSIS — Z98811 Dental restoration status: Secondary | ICD-10-CM

## 2015-05-07 DIAGNOSIS — I350 Nonrheumatic aortic (valve) stenosis: Secondary | ICD-10-CM | POA: Diagnosis not present

## 2015-05-07 DIAGNOSIS — K0530 Chronic periodontitis, unspecified: Secondary | ICD-10-CM

## 2015-05-07 DIAGNOSIS — K030 Excessive attrition of teeth: Secondary | ICD-10-CM

## 2015-05-07 DIAGNOSIS — K031 Abrasion of teeth: Secondary | ICD-10-CM

## 2015-05-07 DIAGNOSIS — Z01818 Encounter for other preprocedural examination: Secondary | ICD-10-CM | POA: Diagnosis not present

## 2015-05-07 DIAGNOSIS — M264 Malocclusion, unspecified: Secondary | ICD-10-CM

## 2015-05-07 DIAGNOSIS — IMO0002 Reserved for concepts with insufficient information to code with codable children: Secondary | ICD-10-CM

## 2015-05-07 DIAGNOSIS — M2629 Other anomalies of dental arch relationship: Secondary | ICD-10-CM

## 2015-05-07 MED ORDER — CHLORHEXIDINE GLUCONATE 0.12 % MT SOLN: OROMUCOSAL | Status: DC

## 2015-05-07 NOTE — Patient Instructions (Signed)
[place]    Department of Dental Medicine     DR. KULINSKI      HEART VALVES AND MOUTH CARE:  FACTS:   If you have any infection in your mouth, it can infect your heart valve.  If you heart valve is infected, you will be seriously ill.  Infections in the mouth can be SILENT and do not always cause pain.  Examples of infections in the mouth are gum disease, dental cavities, and abscesses.  Some possible signs of infection are: Bad breath, bleeding gums, or teeth that are sensitive to sweets, hot, and/or cold. There are many other signs as well.  WHAT YOU HAVE TO DO:   Brush your teeth after meals and at bedtime. Spend at least 2 minutes brushing well, especially behind your back teeth and all around your teeth that stand alone. Brush at the gumline also.  Do not go to bed without brushing your teeth and flossing.  If you gums bleed when you brush or floss, do NOT stop brushing or flossing. It usually means that your gums need more attention and better cleaning.   If your Dentist or Dr. Kulinski gave you a prescription mouthwash to use, make sure to use it as directed. If you run out of the medication, get a refill at the pharmacy.   If you were given any other medications or directions by your Dentist, please follow them. If you did not understand the directions or forget what you were told, please call. We will be happy to refresh her memory.  If you need antibiotics before dental procedures, make sure you take them one hour prior to every dental visit as directed.   Get a dental checkup every 4-6 months in order to keep your mouth healthy, or to find and treat any new infection. You will most likely need your teeth cleaned or gums treated at the same time.  If you are not able to come in for your scheduled appointment, call your Dentist as soon as possible to reschedule.  If you have a problem in between dental visits, call your Dentist.  

## 2015-05-07 NOTE — Progress Notes (Signed)
DENTAL CONSULTATION  Date of Consultation:  05/07/2015 Patient Name:   Jordan [name] Date of Birth:   01-20-1918 Medical Record Number: [id number]  VITALS: BP 139/61 mmHg  Pulse 86  Temp(Src) 97.9 F (36.6 C) (Oral)  CHIEF COMPLAINT: Patient referred by Dr. Roxy Manns for a medically necessary pre-heart valve surgery dental protocol examination.  HPI: Jordan [name] is a 81 year old male recently diagnosed with severe aortic stenosis. Patient with anticipated aortic valve replacement with Dr. Roxy Manns. Patient is now seen as part of a medically necessary pre-heart valve surgery dental protocol examination.  Patient currently denies acute toothaches, swellings, or abscesses. Patient was last seen by his primary dentist, Dr. Arita Miss, to have a tooth pulled. This was approximately 8 years ago. Patient denies having any complications with that dental extraction. Patient indicates that he only sees the dentist when he needs to. Patient has an upper complete denture. Patient has no lower partial denture. The upper denture "fits pretty good."   PROBLEM LIST: Patient Active Problem List   Diagnosis Date Noted  . Aortic stenosis, severe 05/03/2015    Priority: High  . Chest pain 03/09/2015  . Abdominal aneurysm without mention of rupture 02/24/2013  . Pulmonary nodule, right 02/10/2013  . Mobitz type II atrioventricular block 03/09/2012  . Effusion of olecranon bursa, left 01/28/2012  . Aortic stenosis 05/06/2011  . Cardiac pacemaker in situ 10/29/2010  . ABDOMINAL PAIN-LUQ 06/21/2008  . PERSONAL HX COLONIC POLYPS 06/21/2008  . HYPERLIPIDEMIA 06/19/2008  . ANEMIA 06/19/2008  . Coronary atherosclerosis 06/19/2008    PMH: Past Medical History  Diagnosis Date  . CAD (coronary artery disease)     Remote stent to LAD in 1999. Last cath in 2004 and he is managed medically  . Anemia   . Hyperlipidemia   . Pacemaker     due to bradycardia; placed in 2004  . Aortic stenosis   . BPH  (benign prostatic hyperplasia)   . HTN (hypertension)   . Colon polyps   . Blood transfusion   . GERD (gastroesophageal reflux disease)   . Hemorrhoid   . Complication of anesthesia   . Dysrhythmia     paced  . Arthritis 09/18/11    "in my knees"  . Basal cell carcinoma of skin   . AAA (abdominal aortic aneurysm)   . Lung nodule     RLL  . Renal mass, left     PSH: Past Surgical History  Procedure Laterality Date  . Nasal hemorrhage control  1980's  . Insert / replace / remove pacemaker  2004    initial placement; [employer]  . Insert / replace / remove pacemaker  02/14/2011  . Cardiac catheterization  2004    Managed medically  . Coronary angioplasty with stent placement  05/09/1998    "1"; LAD  . Tonsillectomy      "when I was a teenager"  . Skin cancer excision  09/18/11    "have had a couple hundred of them removed since I was in my 55's"  . Cardiac catheterization    . Coronary angiogram  09/19/2011    Procedure: CORONARY ANGIOGRAM;  Surgeon: Josue Hector, MD;  Location: [facility] CATH LAB;  Service: Cardiovascular;;  . Cardiac catheterization N/A 04/11/2015    Procedure: Right/Left Heart Cath and Coronary Angiography;  Surgeon: Burnell Blanks, MD;  Location: [facility];  Service: Cardiovascular;  Laterality: N/A;    ALLERGIES: Allergies  Allergen Reactions  . Aspirin Other (See Comments)  Stomach bleeds    MEDICATIONS: Current Outpatient Prescriptions  Medication Sig Dispense Refill  . amLODipine (NORVASC) 5 MG tablet Take 1 tablet (5 mg total) by mouth daily. 30 tablet 2  . clopidogrel (PLAVIX) 75 MG tablet Take 75 mg by mouth daily.      Marland Kitchen esomeprazole (NEXIUM) 40 MG capsule Take 40 mg by mouth daily.    . ferrous sulfate 325 (65 FE) MG tablet Take 325 mg by mouth every evening.     . rosuvastatin (CRESTOR) 5 MG tablet Take 5 mg by mouth daily.    . silodosin (RAPAFLO) 4 MG CAPS capsule Take 4 mg by mouth every evening.     Marland Kitchen acetaminophen (TYLENOL)  325 MG tablet Take 650 mg by mouth every 6 (six) hours as needed for mild pain, moderate pain or fever.      No current facility-administered medications for this visit.    LABS: Lab Results  Component Value Date   WBC 8.4 04/09/2015   HGB 10.4* 04/09/2015   HCT 31.3* 04/09/2015   MCV 92.9 04/09/2015   PLT 195.0 04/09/2015      Component Value Date/Time   NA 139 04/09/2015 0829   K 4.7 04/09/2015 0829   CL 103 04/09/2015 0829   CO2 32 04/09/2015 0829   GLUCOSE 91 04/09/2015 0829   BUN 19 04/09/2015 0829   CREATININE 1.13 04/09/2015 0829   CALCIUM 9.4 04/09/2015 0829   GFRNONAA 50* 03/09/2015 1628   GFRAA 58* 03/09/2015 1628   Lab Results  Component Value Date   INR 1.1* 04/09/2015   INR 1.10 09/18/2011   INR 1.1* 02/05/2011   No results found for: PTT  SOCIAL HISTORY: Social History   Social History  . Marital Status: Married    Spouse Name: N/A  . Number of Children: 2  . Years of Education: N/A   Occupational History  . Retired     Building control surveyor   Social History Main Topics  . Smoking status: Former Smoker -- 1.00 packs/day for 42 years    Types: Cigarettes    Quit date: 09/15/1973  . Smokeless tobacco: Former Systems developer    Types: Chew  . Alcohol Use: No  . Drug Use: No  . Sexual Activity: Yes   Other Topics Concern  . Not on file   Social History Narrative    FAMILY HISTORY: Family History  Problem Relation Age of Onset  . Cancer Father     stomach  . Colon cancer Father   . Heart disease Brother   . Heart disease Brother   . Diabetes Other     granddaughter    REVIEW OF SYSTEMS: Reviewed with the patient and is included in the dental record.  DENTAL HISTORY: CHIEF COMPLAINT: Patient referred by Dr. Roxy Manns for a medically necessary pre-heart valve surgery dental protocol examination.  HPI: Jordan [name] is a 81 year old male recently diagnosed with severe aortic stenosis. Patient with anticipated aortic valve replacement with Dr. Roxy Manns.  Patient is now seen as part of a medically necessary pre-heart valve surgery dental protocol examination.  Patient currently denies acute toothaches, swellings, or abscesses. Patient was last seen by his primary dentist, Dr. Arita Miss, to have a tooth pulled. This was approximately 8 years ago. Patient denies having any complications with that dental extraction. Patient indicates that he only sees the dentist when he needs to. Patient has an upper complete denture. Patient has no lower partial denture. The upper denture "fits pretty good."  DENTAL EXAMINATION: GENERAL: The patient is a well-developed, well-nourished male in no acute distress.  HEAD AND NECK: There is no palpable submandibular lymphadenopathy. The patient denies acute TMJ symptoms.  INTRAORAL EXAM: Patient has normal saliva. There is no evidence of oral abscess formation. DENTITION: The patient is missing tooth numbers 1 through 16, 17, 20, 21, 27, 28, 30, 31, and 32. Patient has a mandibular anterior incisal attrition. Multiple abfraction lesions are noted. PERIODONTAL: The patient has chronic periodontitis with minimal plaque accumulations, generalized gingival recession, and moderate bone loss. No significant tooth mobility is noted. DENTAL CARIES/SUBOPTIMAL RESTORATIONS: No obvious dental caries are noted. Flexure lesions are noted to be associated with tooth numbers 23, 24, and 25. Ideally, previous resin restorations could be evaluated for replacement. ENDODONTIC: The patient currently denies acute pulpitis symptoms. I do not see any evidence evidence of periapical pathology.  The patient has had previous root canal therapies associated with tooth numbers 18, 19, and 22 with no obvious persistent periapical pathology or radiolucency.  CROWN AND BRIDGE: Patient has multiple crown restorations on tooth numbers 18 and 19 that are acceptable. Ideally, patient could be evaluated for crowns on additional teeth. PROSTHODONTIC:Patient  has an upper complete denture. Patient has no lower partial denture. The upper complete denture is stable and retentive. Patient has a worn denture occlusion. OCCLUSION: Patient has a poor occlusal scheme secondary to multiple missing teeth, deep overbite, multiple diastemas, and lack of replacement of all missing teeth with clinically acceptable dental prostheses.    RADIOGRAPHIC INTERPRETATION: An orthopantogram was taken and supplemented with 5 lower periapical radiographs. There are multiple missing teeth. There is moderate bone loss. There is supra-eruption and drifting of the unopposed teeth into the edentulous areas. Multiple diastemas are noted. There are root canal therapies associated tooth numbers 18 and 19 and 22 with no obvious persistent periapical radiolucency. There are multiple radiopaque areas consistent with previous surgical clip placement for bleeding of his nose in the 1980s per patient report.  ASSESSMENTS: 1. Severe aortic stenosis 2. Preaortic valve replacement dental protocol 3. Chronic periodontitis with bone loss 4. Gingival recession 5. Minimal plaque accumulation.  6. Multiple missing teeth 7. Supra-eruption and drifting of the unopposed teeth into the edentulous areas 8. Multiple diastemas 9. Deep overbite 10. Worn denture teeth occlusion. 11. Mandibular anterior incisal attrition 12. Poor occlusal scheme but a stable occlusion 13. Mandibular anterior incisal attrition 14. Current Plavix therapy with risk for bleeding with invasive dental procedures 15. Need for antibiotic premedication prior to invasive dental procedures after anticipated aortic valve replacement  PLAN/RECOMMENDATIONS: 1. I discussed the risks, benefits, and complications of various treatment options with the patient in relationship to the medical and dental conditions, anticipated aortic valve replacement, and risk for endocarditis. We discussed various treatment options to include no  treatment, periodontal therapy, dental restorations, crown and bridge therapy, implant therapy, and replacement of missing teeth as indicated. The patient currently wishes to defer any dental treatment at this time. Patient did agree to proceed with use of chlorhexidine rinses on a twice a day basis for the next 3-4 months. This prescription was called in to [facility] [employer]. Ideally the patient should follow-up with his primary dentist, Dr. Arita Miss, for routine periodontal maintenance on an every 6 month basis. Patient understands that he will need antibiotic premedication prior to invasive dental procedures per American Heart Association guidelines after the anticipated aortic valve replacement.   2. Discussion of findings with medical team and  coordination of future medical and dental care as needed.  I spent in excess of  75 minutes during the conduct of this consultation and >50% of this time involved direct face-to-face encounter for counseling and/or coordination of the patient's care.    Lenn Cal, DDS

## 2015-05-11 ENCOUNTER — Other Ambulatory Visit (HOSPITAL_COMMUNITY): Payer: Medicare Other

## 2015-05-11 ENCOUNTER — Encounter (HOSPITAL_COMMUNITY): Payer: Self-pay

## 2015-05-11 ENCOUNTER — Encounter (HOSPITAL_COMMUNITY)
Admission: RE | Admit: 2015-05-11 | Discharge: 2015-05-11 | Disposition: A | Discharge status: Home / Self Care | Payer: Medicare Other | Source: Ambulatory Visit | Attending: Cardiovascular Disease | Admitting: Cardiovascular Disease

## 2015-05-11 ENCOUNTER — Other Ambulatory Visit: Payer: Self-pay | Admitting: *Deleted

## 2015-05-11 ENCOUNTER — Telehealth: Payer: Self-pay | Admitting: *Deleted

## 2015-05-11 VITALS — BP 109/64 | HR 71 | Temp 97.8°F | Resp 18 | Ht 64.0 in | Wt 154.3 lb

## 2015-05-11 DIAGNOSIS — Z79899 Other long term (current) drug therapy: Secondary | ICD-10-CM | POA: Insufficient documentation

## 2015-05-11 DIAGNOSIS — I714 Abdominal aortic aneurysm, without rupture: Secondary | ICD-10-CM | POA: Diagnosis not present

## 2015-05-11 DIAGNOSIS — Z01818 Encounter for other preprocedural examination: Secondary | ICD-10-CM | POA: Diagnosis not present

## 2015-05-11 DIAGNOSIS — Z0183 Encounter for blood typing: Secondary | ICD-10-CM | POA: Insufficient documentation

## 2015-05-11 DIAGNOSIS — Z950 Presence of cardiac pacemaker: Secondary | ICD-10-CM | POA: Insufficient documentation

## 2015-05-11 DIAGNOSIS — I517 Cardiomegaly: Secondary | ICD-10-CM | POA: Insufficient documentation

## 2015-05-11 DIAGNOSIS — Z7902 Long term (current) use of antithrombotics/antiplatelets: Secondary | ICD-10-CM | POA: Insufficient documentation

## 2015-05-11 DIAGNOSIS — E785 Hyperlipidemia, unspecified: Secondary | ICD-10-CM | POA: Diagnosis not present

## 2015-05-11 DIAGNOSIS — I10 Essential (primary) hypertension: Secondary | ICD-10-CM | POA: Insufficient documentation

## 2015-05-11 DIAGNOSIS — Z01812 Encounter for preprocedural laboratory examination: Secondary | ICD-10-CM | POA: Diagnosis not present

## 2015-05-11 DIAGNOSIS — R9431 Abnormal electrocardiogram [ECG] [EKG]: Secondary | ICD-10-CM | POA: Diagnosis not present

## 2015-05-11 DIAGNOSIS — I2510 Atherosclerotic heart disease of native coronary artery without angina pectoris: Secondary | ICD-10-CM | POA: Diagnosis not present

## 2015-05-11 DIAGNOSIS — I350 Nonrheumatic aortic (valve) stenosis: Secondary | ICD-10-CM | POA: Diagnosis not present

## 2015-05-11 DIAGNOSIS — Z87891 Personal history of nicotine dependence: Secondary | ICD-10-CM | POA: Diagnosis not present

## 2015-05-11 DIAGNOSIS — N2889 Other specified disorders of kidney and ureter: Secondary | ICD-10-CM | POA: Diagnosis not present

## 2015-05-11 DIAGNOSIS — K219 Gastro-esophageal reflux disease without esophagitis: Secondary | ICD-10-CM | POA: Insufficient documentation

## 2015-05-11 LAB — CBC
HCT: 34.7 % — ABNORMAL LOW (ref 39.0–52.0)
HEMOGLOBIN: 11.3 g/dL — AB (ref 13.0–17.0)
MCH: 30.6 pg (ref 26.0–34.0)
MCHC: 32.6 g/dL (ref 30.0–36.0)
MCV: 94 fL (ref 78.0–100.0)
PLATELETS: 185 10*3/uL (ref 150–400)
RBC: 3.69 MIL/uL — AB (ref 4.22–5.81)
RDW: 12.7 % (ref 11.5–15.5)
WBC: 8.2 10*3/uL (ref 4.0–10.5)

## 2015-05-11 LAB — BLOOD GAS, ARTERIAL
ACID-BASE EXCESS: 2.8 mmol/L — AB (ref 0.0–2.0)
Bicarbonate: 26.9 mEq/L — ABNORMAL HIGH (ref 20.0–24.0)
DRAWN BY: 206361
FIO2: 0.21
O2 Saturation: 97.2 %
PCO2 ART: 42.3 mmHg (ref 35.0–45.0)
PH ART: 7.42 (ref 7.350–7.450)
Patient temperature: 98.6
TCO2: 28.2 mmol/L (ref 0–100)
pO2, Arterial: 85.4 mmHg (ref 80.0–100.0)

## 2015-05-11 LAB — URINALYSIS, ROUTINE W REFLEX MICROSCOPIC
Bilirubin Urine: NEGATIVE
Glucose, UA: NEGATIVE mg/dL
Hgb urine dipstick: NEGATIVE
Ketones, ur: NEGATIVE mg/dL
LEUKOCYTES UA: NEGATIVE
NITRITE: NEGATIVE
Protein, ur: NEGATIVE mg/dL
SPECIFIC GRAVITY, URINE: 1.017 (ref 1.005–1.030)
UROBILINOGEN UA: 0.2 mg/dL (ref 0.0–1.0)
pH: 6.5 (ref 5.0–8.0)

## 2015-05-11 LAB — COMPREHENSIVE METABOLIC PANEL
ALT: 22 U/L (ref 17–63)
AST: 26 U/L (ref 15–41)
Albumin: 3.5 g/dL (ref 3.5–5.0)
Alkaline Phosphatase: 48 U/L (ref 38–126)
Anion gap: 8 (ref 5–15)
BUN: 21 mg/dL — AB (ref 6–20)
CHLORIDE: 102 mmol/L (ref 101–111)
CO2: 26 mmol/L (ref 22–32)
CREATININE: 1.16 mg/dL (ref 0.61–1.24)
Calcium: 8.9 mg/dL (ref 8.9–10.3)
GFR calc Af Amer: >60 mL/min (ref >60)
GFR, EST NON AFRICAN AMERICAN: 52 mL/min — AB (ref >60)
Glucose, Bld: 94 mg/dL (ref 65–99)
Potassium: 4.1 mmol/L (ref 3.5–5.1)
Sodium: 136 mmol/L (ref 135–145)
Total Bilirubin: 0.5 mg/dL (ref 0.3–1.2)
Total Protein: 7.2 g/dL (ref 6.5–8.1)

## 2015-05-11 LAB — PROTIME-INR
INR: 1.07 (ref 0.00–1.49)
Prothrombin Time: 14.1 seconds (ref 11.6–15.2)

## 2015-05-11 LAB — SURGICAL PCR SCREEN
MRSA, PCR: NEGATIVE
STAPHYLOCOCCUS AUREUS: NEGATIVE

## 2015-05-11 LAB — APTT: APTT: 76 s — AB (ref 24–37)

## 2015-05-11 MED ORDER — CHLORHEXIDINE GLUCONATE 4 % EX LIQD
60.0000 mL | Freq: Once | CUTANEOUS | Status: DC
Start: 2015-05-11 — End: 2015-05-12

## 2015-05-11 MED ORDER — CHLORHEXIDINE GLUCONATE 4 % EX LIQD: 30.0000 mL | CUTANEOUS | Status: DC

## 2015-05-11 MED ORDER — CHLORHEXIDINE GLUCONATE 4 % EX LIQD: 60.0000 mL | Freq: Once | CUTANEOUS | Status: DC

## 2015-05-11 NOTE — Telephone Encounter (Signed)
TAVR surgery scheduled for 8/30.  He is to check into [facility] @ 8:30am.  Patient and his son are aware.

## 2015-05-11 NOTE — Progress Notes (Signed)
Anesthesia Chart Review:  Pt is 81 year old male scheduled for transfemoral TAVR, TEE on 8/302016 with Dr. Angelena Form.   PMH includes: CAD (stent to LAD 1999), aortic stenosis, HTN, pacemaker (for bradycardia), hyperlipidemia, anemia, AAA, L renal mass, GERD. Former smoker. BMI 26.5  Medications include: amlodipine, plavix, nexium, iron, crestor, silodosin.   Preoperative labs reviewed. PTT 76. Left voicemail for Ryan in Dr. Camillia Herter office with result.   Chest x-ray 05/11/2015 reviewed.  1. Cardiac pacer with lead tips in right atrium and right ventricle. Stable cardiomegaly. 2. Low lung volumes with bibasilar subsegmental atelectasis and/or pleural parenchymal scarring.  EKG 05/11/2015: AV dual-paced rhythm. No significant change since last tracing per Dr. Doug Sou interpretation.   Cardiac cath 04/11/2015:   Prox RCA lesion, 30% stenosed.  Prox LAD lesion, 40% stenosed.  Prox Cx lesion, 30% stenosed.  Severe aortic valve stenosis  1. Mild to moderate non-obstructive CAD  2. Severe aortic valve stenosis (mean gradient 41 mmHg, Peak to peak 59 mm Hg), AVA 1.35 cm2  Echo 03/10/2015: - Left ventricle: The cavity size was normal. Wall thickness was increased in a pattern of mild LVH. Systolic function was normal. The estimated ejection fraction was in the range of 60% to 65%. Wall motion was normal; there were no regional wall motion abnormalities. Doppler parameters are consistent with abnormal left ventricular relaxation (grade 1 diastolic dysfunction). - Aortic valve: Moderately calcified annulus. Trileaflet; moderately thickened leaflets. There was severe stenosis. Mean gradient (S): 42 mm Hg. VTI ratio of LVOT to aortic valve: 0.26. Valve area (VTI): 0.74 cm^2. Valve area (Vmax): 0.77 cm^2. Valve area (Vmean): 0.66 cm^2. - Mitral valve: Moderately calcified annulus. Moderately thickened leaflets . The findings are consistent with mild stenosis. There was mild regurgitation. Valve  area by continuity equation (using LVOT flow): 2.14 cm^2. - Left atrium: The atrium was mildly dilated. - Technically difficult study.  Perioperative ICD prescription form still pending.   Willeen Cass, FNP-BC [facility]/Anesthesiology Phone: [phone] 05/11/2015 4:47 PM

## 2015-05-12 LAB — HEMOGLOBIN A1C
Hgb A1c MFr Bld: 5.5 % (ref 4.8–5.6)
Mean Plasma Glucose: 111 mg/dL

## 2015-05-14 MED ORDER — DEXTROSE 5 % IV SOLN
1.5000 g | INTRAVENOUS | Status: AC
  Administered 2015-05-15: 1.5 g via INTRAVENOUS
  Filled 2015-05-14: qty 1.5

## 2015-05-14 MED ORDER — NOREPINEPHRINE BITARTRATE 1 MG/ML IV SOLN
0.0000 ug/min | INTRAVENOUS | Status: AC
  Administered 2015-05-15: 1 ug/min via INTRAVENOUS
  Filled 2015-05-14: qty 4

## 2015-05-14 MED ORDER — VANCOMYCIN HCL 10 G IV SOLR
1250.0000 mg | INTRAVENOUS | Status: AC
  Administered 2015-05-15: 1250 mg via INTRAVENOUS
  Filled 2015-05-14: qty 1250

## 2015-05-14 MED ORDER — DEXMEDETOMIDINE HCL IN NACL 400 MCG/100ML IV SOLN
0.1000 ug/kg/h | INTRAVENOUS | Status: AC
  Administered 2015-05-15: .3 ug/kg/h via INTRAVENOUS
  Filled 2015-05-14: qty 100

## 2015-05-14 MED ORDER — PHENYLEPHRINE HCL 10 MG/ML IJ SOLN
30.0000 ug/min | INTRAVENOUS | Status: AC
  Administered 2015-05-15: 10 ug/min via INTRAVENOUS
  Filled 2015-05-14: qty 2

## 2015-05-14 MED ORDER — NITROGLYCERIN IN D5W 200-5 MCG/ML-% IV SOLN
2.0000 ug/min | INTRAVENOUS | Status: AC
  Administered 2015-05-15: 5 ug/min via INTRAVENOUS
  Filled 2015-05-14: qty 250

## 2015-05-14 MED ORDER — SODIUM CHLORIDE 0.9 % IV SOLN
INTRAVENOUS | Status: DC
  Filled 2015-05-14: qty 2.5

## 2015-05-14 MED ORDER — POTASSIUM CHLORIDE 2 MEQ/ML IV SOLN
80.0000 meq | INTRAVENOUS | Status: DC
  Filled 2015-05-14: qty 40

## 2015-05-14 MED ORDER — DEXTROSE 5 % IV SOLN
0.0000 ug/min | INTRAVENOUS | Status: DC
  Filled 2015-05-14: qty 4

## 2015-05-14 MED ORDER — MAGNESIUM SULFATE 50 % IJ SOLN
40.0000 meq | INTRAMUSCULAR | Status: DC
  Filled 2015-05-14: qty 10

## 2015-05-14 MED ORDER — DOPAMINE-DEXTROSE 3.2-5 MG/ML-% IV SOLN
0.0000 ug/kg/min | INTRAVENOUS | Status: DC
  Filled 2015-05-14: qty 250

## 2015-05-14 MED ORDER — SODIUM CHLORIDE 0.9 % IV SOLN
INTRAVENOUS | Status: DC
  Filled 2015-05-14: qty 30

## 2015-05-14 NOTE — Anesthesia Preprocedure Evaluation (Addendum)
Anesthesia Evaluation  Patient identified by MRN, date of birth, ID band Patient awake    Reviewed: Allergy & Precautions, NPO status , Patient's Chart, lab work & pertinent test results, reviewed documented beta blocker date and time   Airway Mallampati: I  TM Distance: >3 FB Neck ROM: Full    Dental  (+) Edentulous Upper, Edentulous Lower, Dental Advisory Given   Pulmonary former smoker,  breath sounds clear to auscultation        Cardiovascular hypertension, Pt. on home beta blockers + CAD and + Peripheral Vascular Disease + pacemaker Rhythm:Regular Rate:Normal     Neuro/Psych    GI/Hepatic   Endo/Other    Renal/GU      Musculoskeletal  (+) Arthritis -,   Abdominal   Peds  Hematology  (+) anemia ,   Anesthesia Other Findings   Reproductive/Obstetrics                          Anesthesia Physical Anesthesia Plan  ASA: III  Anesthesia Plan: General   Post-op Pain Management:    Induction: Intravenous  Airway Management Planned: Oral ETT  Additional Equipment: Arterial line, CVP, PA Cath, 3D TEE and Ultrasound Guidance Line Placement  Intra-op Plan:   Post-operative Plan: Extubation in OR  Informed Consent:   Plan Discussed with: CRNA and Anesthesiologist  Anesthesia Plan Comments:         Anesthesia Quick Evaluation

## 2015-05-15 ENCOUNTER — Inpatient Hospital Stay (HOSPITAL_COMMUNITY): Payer: Medicare Other

## 2015-05-15 ENCOUNTER — Inpatient Hospital Stay (HOSPITAL_COMMUNITY): Payer: Medicare Other | Admitting: Emergency Medicine

## 2015-05-15 ENCOUNTER — Inpatient Hospital Stay (HOSPITAL_COMMUNITY)
Admission: RE | Admit: 2015-05-15 | Discharge: 2015-05-15 | Disposition: A | Discharge status: Home / Self Care | Payer: Medicare Other | Source: Ambulatory Visit | Attending: Surgery | Admitting: Surgery

## 2015-05-15 ENCOUNTER — Inpatient Hospital Stay (HOSPITAL_COMMUNITY)
Admission: RE | Admit: 2015-05-15 | Discharge: 2015-05-19 | DRG: 267 | Disposition: A | Discharge status: Home / Self Care | Payer: Medicare Other | Source: Ambulatory Visit | Attending: Cardiovascular Disease | Admitting: Cardiovascular Disease

## 2015-05-15 ENCOUNTER — Encounter (HOSPITAL_COMMUNITY)
Admission: RE | Disposition: A | Discharge status: Home / Self Care | Payer: Medicare Other | Source: Ambulatory Visit | Attending: Cardiovascular Disease

## 2015-05-15 ENCOUNTER — Encounter (HOSPITAL_COMMUNITY): Payer: Self-pay | Admitting: *Deleted

## 2015-05-15 DIAGNOSIS — I739 Peripheral vascular disease, unspecified: Secondary | ICD-10-CM | POA: Diagnosis present

## 2015-05-15 DIAGNOSIS — Z954 Presence of other heart-valve replacement: Secondary | ICD-10-CM | POA: Diagnosis not present

## 2015-05-15 DIAGNOSIS — D6489 Other specified anemias: Secondary | ICD-10-CM | POA: Diagnosis not present

## 2015-05-15 DIAGNOSIS — Z87891 Personal history of nicotine dependence: Secondary | ICD-10-CM

## 2015-05-15 DIAGNOSIS — R2231 Localized swelling, mass and lump, right upper limb: Secondary | ICD-10-CM | POA: Diagnosis present

## 2015-05-15 DIAGNOSIS — Z006 Encounter for examination for normal comparison and control in clinical research program: Secondary | ICD-10-CM

## 2015-05-15 DIAGNOSIS — D696 Thrombocytopenia, unspecified: Secondary | ICD-10-CM | POA: Diagnosis not present

## 2015-05-15 DIAGNOSIS — K219 Gastro-esophageal reflux disease without esophagitis: Secondary | ICD-10-CM | POA: Diagnosis present

## 2015-05-15 DIAGNOSIS — E785 Hyperlipidemia, unspecified: Secondary | ICD-10-CM | POA: Diagnosis present

## 2015-05-15 DIAGNOSIS — I2510 Atherosclerotic heart disease of native coronary artery without angina pectoris: Secondary | ICD-10-CM | POA: Diagnosis present

## 2015-05-15 DIAGNOSIS — Z953 Presence of xenogenic heart valve: Secondary | ICD-10-CM

## 2015-05-15 DIAGNOSIS — D62 Acute posthemorrhagic anemia: Secondary | ICD-10-CM | POA: Diagnosis not present

## 2015-05-15 DIAGNOSIS — I959 Hypotension, unspecified: Secondary | ICD-10-CM | POA: Diagnosis not present

## 2015-05-15 DIAGNOSIS — I350 Nonrheumatic aortic (valve) stenosis: Secondary | ICD-10-CM

## 2015-05-15 DIAGNOSIS — R609 Edema, unspecified: Secondary | ICD-10-CM | POA: Diagnosis not present

## 2015-05-15 DIAGNOSIS — I10 Essential (primary) hypertension: Secondary | ICD-10-CM | POA: Diagnosis present

## 2015-05-15 DIAGNOSIS — Z950 Presence of cardiac pacemaker: Secondary | ICD-10-CM | POA: Diagnosis not present

## 2015-05-15 DIAGNOSIS — Z952 Presence of prosthetic heart valve: Secondary | ICD-10-CM

## 2015-05-15 DIAGNOSIS — Z955 Presence of coronary angioplasty implant and graft: Secondary | ICD-10-CM

## 2015-05-15 DIAGNOSIS — R55 Syncope and collapse: Secondary | ICD-10-CM | POA: Diagnosis not present

## 2015-05-15 HISTORY — PX: TRANSCATHETER AORTIC VALVE REPLACEMENT, TRANSFEMORAL: SHX6400

## 2015-05-15 HISTORY — DX: Presence of prosthetic heart valve: Z95.2

## 2015-05-15 HISTORY — PX: TEE WITHOUT CARDIOVERSION: SHX5443

## 2015-05-15 LAB — POCT I-STAT 4, (NA,K, GLUC, HGB,HCT)
Glucose, Bld: 120 mg/dL — ABNORMAL HIGH (ref 65–99)
HEMATOCRIT: 33 % — AB (ref 39.0–52.0)
Hemoglobin: 11.2 g/dL — ABNORMAL LOW (ref 13.0–17.0)
Potassium: 4 mmol/L (ref 3.5–5.1)
Sodium: 139 mmol/L (ref 135–145)

## 2015-05-15 LAB — POCT I-STAT, CHEM 8
BUN: 18 mg/dL (ref 6–20)
BUN: 18 mg/dL (ref 6–20)
BUN: 17 mg/dL (ref 6–20)
CALCIUM ION: 1.16 mmol/L (ref 1.13–1.30)
CALCIUM ION: 1.2 mmol/L (ref 1.13–1.30)
CHLORIDE: 102 mmol/L (ref 101–111)
CHLORIDE: 102 mmol/L (ref 101–111)
CREATININE: 1 mg/dL (ref 0.61–1.24)
CREATININE: 1 mg/dL (ref 0.61–1.24)
Calcium, Ion: 1.17 mmol/L (ref 1.13–1.30)
Chloride: 101 mmol/L (ref 101–111)
Creatinine, Ser: 1.1 mg/dL (ref 0.61–1.24)
GLUCOSE: 98 mg/dL (ref 65–99)
GLUCOSE: 116 mg/dL — AB (ref 65–99)
Glucose, Bld: 113 mg/dL — ABNORMAL HIGH (ref 65–99)
HCT: 30 % — ABNORMAL LOW (ref 39.0–52.0)
HCT: 33 % — ABNORMAL LOW (ref 39.0–52.0)
HEMATOCRIT: 27 % — AB (ref 39.0–52.0)
Hemoglobin: 10.2 g/dL — ABNORMAL LOW (ref 13.0–17.0)
Hemoglobin: 11.2 g/dL — ABNORMAL LOW (ref 13.0–17.0)
Hemoglobin: 9.2 g/dL — ABNORMAL LOW (ref 13.0–17.0)
POTASSIUM: 3.9 mmol/L (ref 3.5–5.1)
POTASSIUM: 3.8 mmol/L (ref 3.5–5.1)
Potassium: 3.7 mmol/L (ref 3.5–5.1)
SODIUM: 138 mmol/L (ref 135–145)
Sodium: 139 mmol/L (ref 135–145)
Sodium: 138 mmol/L (ref 135–145)
TCO2: 25 mmol/L (ref 0–100)
TCO2: 28 mmol/L (ref 0–100)
TCO2: 24 mmol/L (ref 0–100)

## 2015-05-15 LAB — CBC
HCT: 27.5 % — ABNORMAL LOW (ref 39.0–52.0)
Hemoglobin: 9 g/dL — ABNORMAL LOW (ref 13.0–17.0)
MCH: 30.6 pg (ref 26.0–34.0)
MCHC: 32.7 g/dL (ref 30.0–36.0)
MCV: 93.5 fL (ref 78.0–100.0)
PLATELETS: 137 10*3/uL — AB (ref 150–400)
RBC: 2.94 MIL/uL — ABNORMAL LOW (ref 4.22–5.81)
RDW: 12.6 % (ref 11.5–15.5)
WBC: 8.3 10*3/uL (ref 4.0–10.5)

## 2015-05-15 LAB — PROTIME-INR
INR: 1.45 (ref 0.00–1.49)
Prothrombin Time: 17.7 seconds — ABNORMAL HIGH (ref 11.6–15.2)

## 2015-05-15 LAB — APTT: APTT: 43 s — AB (ref 24–37)

## 2015-05-15 LAB — POCT I-STAT 3, ART BLOOD GAS (G3+)
Bicarbonate: 26 mEq/L — ABNORMAL HIGH (ref 20.0–24.0)
O2 SAT: 98 %
PCO2 ART: 45.6 mmHg — AB (ref 35.0–45.0)
PH ART: 7.355 (ref 7.350–7.450)
PO2 ART: 108 mmHg — AB (ref 80.0–100.0)
Patient temperature: 35.1
TCO2: 28 mmol/L (ref 0–100)

## 2015-05-15 LAB — PREPARE RBC (CROSSMATCH)

## 2015-05-15 SURGERY — IMPLANTATION, AORTIC VALVE, TRANSCATHETER, FEMORAL APPROACH
Anesthesia: General

## 2015-05-15 MED ORDER — ACETAMINOPHEN 500 MG PO TABS
1000.0000 mg | ORAL_TABLET | Freq: Four times a day (QID) | ORAL | Status: DC
  Administered 2015-05-16 – 2015-05-19 (×11): 1000 mg via ORAL
  Filled 2015-05-15 (×19): qty 2

## 2015-05-15 MED ORDER — OXYCODONE HCL 5 MG PO TABS: 5.0000 mg | ORAL_TABLET | ORAL | Status: DC | PRN

## 2015-05-15 MED ORDER — ONDANSETRON HCL 4 MG/2ML IJ SOLN
INTRAMUSCULAR | Status: AC
  Filled 2015-05-15: qty 2

## 2015-05-15 MED ORDER — SODIUM CHLORIDE 0.9 % IV SOLN: Freq: Once | INTRAVENOUS | Status: DC

## 2015-05-15 MED ORDER — LACTATED RINGERS IV SOLN
INTRAVENOUS | Status: DC
  Administered 2015-05-15 (×3): via INTRAVENOUS

## 2015-05-15 MED ORDER — HEPARIN SODIUM (PORCINE) 1000 UNIT/ML IJ SOLN
INTRAMUSCULAR | Status: AC
  Filled 2015-05-15: qty 1

## 2015-05-15 MED ORDER — NEOSTIGMINE METHYLSULFATE 10 MG/10ML IV SOLN
INTRAVENOUS | Status: DC | PRN
  Administered 2015-05-15: 3 mg via INTRAVENOUS

## 2015-05-15 MED ORDER — ALBUMIN HUMAN 5 % IV SOLN
250.0000 mL | INTRAVENOUS | Status: AC | PRN
  Administered 2015-05-15 (×2): 250 mL via INTRAVENOUS
  Filled 2015-05-15 (×2): qty 250

## 2015-05-15 MED ORDER — IODIXANOL 320 MG/ML IV SOLN
INTRAVENOUS | Status: DC | PRN
  Administered 2015-05-15: 99 mL via INTRA_ARTERIAL

## 2015-05-15 MED ORDER — ROSUVASTATIN CALCIUM 5 MG PO TABS
5.0000 mg | ORAL_TABLET | Freq: Every day | ORAL | Status: DC
  Administered 2015-05-16 – 2015-05-19 (×4): 5 mg via ORAL
  Filled 2015-05-15 (×4): qty 1

## 2015-05-15 MED ORDER — SODIUM CHLORIDE 0.9 % IV SOLN
INTRAVENOUS | Status: DC
  Administered 2015-05-15: 50 mL via INTRAVENOUS
  Administered 2015-05-16: 11:00:00 via INTRAVENOUS

## 2015-05-15 MED ORDER — FENTANYL CITRATE (PF) 100 MCG/2ML IJ SOLN
INTRAMUSCULAR | Status: AC
  Administered 2015-05-15: 50 ug via INTRAVENOUS
  Filled 2015-05-15: qty 2

## 2015-05-15 MED ORDER — SODIUM CHLORIDE 0.9 % IV SOLN: INTRAVENOUS | Status: DC

## 2015-05-15 MED ORDER — INSULIN REGULAR BOLUS VIA INFUSION
0.0000 [IU] | Freq: Three times a day (TID) | INTRAVENOUS | Status: DC
  Filled 2015-05-15: qty 10

## 2015-05-15 MED ORDER — METOPROLOL TARTRATE 1 MG/ML IV SOLN: 2.5000 mg | INTRAVENOUS | Status: DC | PRN

## 2015-05-15 MED ORDER — ALBUMIN HUMAN 5 % IV SOLN
INTRAVENOUS | Status: DC | PRN
  Administered 2015-05-15 (×2): via INTRAVENOUS

## 2015-05-15 MED ORDER — AMLODIPINE BESYLATE 5 MG PO TABS
5.0000 mg | ORAL_TABLET | Freq: Every day | ORAL | Status: DC
  Filled 2015-05-15: qty 1

## 2015-05-15 MED ORDER — HEPARIN SODIUM (PORCINE) 1000 UNIT/ML IJ SOLN
INTRAMUSCULAR | Status: DC | PRN
  Administered 2015-05-15: 7000 [IU] via INTRAVENOUS

## 2015-05-15 MED ORDER — ROCURONIUM BROMIDE 100 MG/10ML IV SOLN
INTRAVENOUS | Status: DC | PRN
  Administered 2015-05-15: 10 mg via INTRAVENOUS
  Administered 2015-05-15: 40 mg via INTRAVENOUS

## 2015-05-15 MED ORDER — MIDAZOLAM HCL 2 MG/2ML IJ SOLN
INTRAMUSCULAR | Status: AC
  Administered 2015-05-15: 1 mg via INTRAVENOUS
  Filled 2015-05-15: qty 2

## 2015-05-15 MED ORDER — PROTAMINE SULFATE 10 MG/ML IV SOLN
INTRAVENOUS | Status: DC | PRN
  Administered 2015-05-15 (×2): 25 mg via INTRAVENOUS
  Administered 2015-05-15: 20 mg via INTRAVENOUS
  Administered 2015-05-15 (×2): 25 mg via INTRAVENOUS

## 2015-05-15 MED ORDER — 0.9 % SODIUM CHLORIDE (POUR BTL) OPTIME
TOPICAL | Status: DC | PRN
  Administered 2015-05-15: 1000 mL

## 2015-05-15 MED ORDER — TRAMADOL HCL 50 MG PO TABS: 50.0000 mg | ORAL_TABLET | ORAL | Status: DC | PRN

## 2015-05-15 MED ORDER — MORPHINE SULFATE (PF) 2 MG/ML IV SOLN: 1.0000 mg | INTRAVENOUS | Status: AC | PRN

## 2015-05-15 MED ORDER — ONDANSETRON HCL 4 MG/2ML IJ SOLN
4.0000 mg | Freq: Four times a day (QID) | INTRAMUSCULAR | Status: DC | PRN
  Administered 2015-05-15: 4 mg via INTRAVENOUS
  Filled 2015-05-15: qty 2

## 2015-05-15 MED ORDER — PANTOPRAZOLE SODIUM 40 MG PO TBEC
40.0000 mg | DELAYED_RELEASE_TABLET | Freq: Every day | ORAL | Status: DC
  Administered 2015-05-16 – 2015-05-17 (×2): 40 mg via ORAL
  Filled 2015-05-15 (×2): qty 1

## 2015-05-15 MED ORDER — ACETAMINOPHEN 160 MG/5ML PO SOLN
1000.0000 mg | Freq: Four times a day (QID) | ORAL | Status: DC
Start: 2015-05-16 — End: 2015-05-19
  Filled 2015-05-15: qty 40

## 2015-05-15 MED ORDER — FENTANYL CITRATE (PF) 100 MCG/2ML IJ SOLN
50.0000 ug | Freq: Once | INTRAMUSCULAR | Status: AC
  Administered 2015-05-15: 50 ug via INTRAVENOUS

## 2015-05-15 MED ORDER — MIDAZOLAM HCL 2 MG/2ML IJ SOLN: 2.0000 mg | INTRAMUSCULAR | Status: DC | PRN

## 2015-05-15 MED ORDER — PHENYLEPHRINE HCL 10 MG/ML IJ SOLN
0.0000 ug/min | INTRAMUSCULAR | Status: DC
  Filled 2015-05-15: qty 2

## 2015-05-15 MED ORDER — FENTANYL CITRATE (PF) 250 MCG/5ML IJ SOLN
INTRAMUSCULAR | Status: AC
  Filled 2015-05-15: qty 5

## 2015-05-15 MED ORDER — CEFUROXIME SODIUM 1.5 G IJ SOLR
1.5000 g | Freq: Two times a day (BID) | INTRAMUSCULAR | Status: AC
Start: 2015-05-15 — End: 2015-05-17
  Administered 2015-05-15 – 2015-05-17 (×4): 1.5 g via INTRAVENOUS
  Filled 2015-05-15 (×4): qty 1.5

## 2015-05-15 MED ORDER — LACTATED RINGERS IV SOLN: 500.0000 mL | Freq: Once | INTRAVENOUS | Status: DC | PRN

## 2015-05-15 MED ORDER — SODIUM CHLORIDE 0.9 % IR SOLN
Status: DC | PRN
  Administered 2015-05-15 (×3): 500 mL

## 2015-05-15 MED ORDER — PROTAMINE SULFATE 10 MG/ML IV SOLN
INTRAVENOUS | Status: AC
  Filled 2015-05-15: qty 25

## 2015-05-15 MED ORDER — ACETAMINOPHEN 650 MG RE SUPP: 650.0000 mg | Freq: Once | RECTAL | Status: DC

## 2015-05-15 MED ORDER — LIDOCAINE HCL (CARDIAC) 20 MG/ML IV SOLN
INTRAVENOUS | Status: AC
  Filled 2015-05-15: qty 5

## 2015-05-15 MED ORDER — FERROUS SULFATE 325 (65 FE) MG PO TABS
325.0000 mg | ORAL_TABLET | Freq: Every evening | ORAL | Status: DC
  Administered 2015-05-16: 325 mg via ORAL
  Filled 2015-05-15 (×2): qty 1

## 2015-05-15 MED ORDER — METOPROLOL TARTRATE 12.5 MG HALF TABLET
12.5000 mg | ORAL_TABLET | Freq: Two times a day (BID) | ORAL | Status: DC
  Administered 2015-05-15: 12.5 mg via ORAL
  Filled 2015-05-15 (×3): qty 1

## 2015-05-15 MED ORDER — MORPHINE SULFATE (PF) 2 MG/ML IV SOLN
2.0000 mg | INTRAVENOUS | Status: DC | PRN
  Administered 2015-05-15: 2 mg via INTRAVENOUS
  Filled 2015-05-15: qty 1

## 2015-05-15 MED ORDER — CLOPIDOGREL BISULFATE 75 MG PO TABS
75.0000 mg | ORAL_TABLET | Freq: Every day | ORAL | Status: DC
  Filled 2015-05-15: qty 1

## 2015-05-15 MED ORDER — FENTANYL CITRATE (PF) 100 MCG/2ML IJ SOLN
INTRAMUSCULAR | Status: DC | PRN
  Administered 2015-05-15: 50 ug via INTRAVENOUS

## 2015-05-15 MED ORDER — ROCURONIUM BROMIDE 50 MG/5ML IV SOLN
INTRAVENOUS | Status: AC
  Filled 2015-05-15: qty 2

## 2015-05-15 MED ORDER — FAMOTIDINE IN NACL 20-0.9 MG/50ML-% IV SOLN
20.0000 mg | Freq: Two times a day (BID) | INTRAVENOUS | Status: AC
  Administered 2015-05-15: 20 mg via INTRAVENOUS
  Filled 2015-05-15: qty 50

## 2015-05-15 MED ORDER — DEXMEDETOMIDINE HCL IN NACL 200 MCG/50ML IV SOLN: 0.1000 ug/kg/h | INTRAVENOUS | Status: DC

## 2015-05-15 MED ORDER — MIDAZOLAM HCL 2 MG/2ML IJ SOLN
1.0000 mg | Freq: Once | INTRAMUSCULAR | Status: AC
  Administered 2015-05-15: 1 mg via INTRAVENOUS

## 2015-05-15 MED ORDER — CETYLPYRIDINIUM CHLORIDE 0.05 % MT LIQD
7.0000 mL | Freq: Two times a day (BID) | OROMUCOSAL | Status: DC
  Administered 2015-05-15 – 2015-05-19 (×8): 7 mL via OROMUCOSAL

## 2015-05-15 MED ORDER — METOPROLOL TARTRATE 25 MG/10 ML ORAL SUSPENSION
12.5000 mg | Freq: Two times a day (BID) | ORAL | Status: DC
  Filled 2015-05-15 (×3): qty 5

## 2015-05-15 MED ORDER — PROPOFOL 10 MG/ML IV BOLUS
INTRAVENOUS | Status: DC | PRN
  Administered 2015-05-15: 80 mg via INTRAVENOUS

## 2015-05-15 MED ORDER — ONDANSETRON HCL 4 MG/2ML IJ SOLN
INTRAMUSCULAR | Status: DC | PRN
Start: 2015-05-15 — End: 2015-05-15
  Administered 2015-05-15: 4 mg via INTRAVENOUS

## 2015-05-15 MED ORDER — GLYCOPYRROLATE 0.2 MG/ML IJ SOLN
INTRAMUSCULAR | Status: AC
  Filled 2015-05-15: qty 2

## 2015-05-15 MED ORDER — NEOSTIGMINE METHYLSULFATE 10 MG/10ML IV SOLN
INTRAVENOUS | Status: AC
  Filled 2015-05-15: qty 1

## 2015-05-15 MED ORDER — GLYCOPYRROLATE 0.2 MG/ML IJ SOLN
INTRAMUSCULAR | Status: DC | PRN
  Administered 2015-05-15: 0.4 mg via INTRAVENOUS

## 2015-05-15 MED ORDER — EPHEDRINE SULFATE 50 MG/ML IJ SOLN
INTRAMUSCULAR | Status: AC
  Filled 2015-05-15: qty 1

## 2015-05-15 MED ORDER — ARTIFICIAL TEARS OP OINT
TOPICAL_OINTMENT | OPHTHALMIC | Status: AC
  Filled 2015-05-15: qty 3.5

## 2015-05-15 MED ORDER — INSULIN REGULAR HUMAN 100 UNIT/ML IJ SOLN
INTRAMUSCULAR | Status: DC
  Filled 2015-05-15: qty 2.5

## 2015-05-15 MED ORDER — NITROGLYCERIN IN D5W 200-5 MCG/ML-% IV SOLN: 0.0000 ug/min | INTRAVENOUS | Status: DC

## 2015-05-15 MED ORDER — VANCOMYCIN HCL IN DEXTROSE 1-5 GM/200ML-% IV SOLN
1000.0000 mg | Freq: Once | INTRAVENOUS | Status: AC
  Administered 2015-05-15: 1000 mg via INTRAVENOUS
  Filled 2015-05-15: qty 200

## 2015-05-15 MED ORDER — SUCCINYLCHOLINE CHLORIDE 20 MG/ML IJ SOLN
INTRAMUSCULAR | Status: AC
  Filled 2015-05-15: qty 1

## 2015-05-15 MED ORDER — ACETAMINOPHEN 160 MG/5ML PO SOLN: 650.0000 mg | Freq: Once | ORAL | Status: DC

## 2015-05-15 MED ORDER — SODIUM CHLORIDE 0.9 % IJ SOLN
INTRAMUSCULAR | Status: AC
  Filled 2015-05-15: qty 10

## 2015-05-15 MED ORDER — TAMSULOSIN HCL 0.4 MG PO CAPS
0.4000 mg | ORAL_CAPSULE | Freq: Every day | ORAL | Status: DC
  Administered 2015-05-16 – 2015-05-19 (×4): 0.4 mg via ORAL
  Filled 2015-05-15 (×5): qty 1

## 2015-05-15 SURGICAL SUPPLY — 95 items
BAG BANDED W/RUBBER/TAPE 36X54 (MISCELLANEOUS) ×3 IMPLANT
BAG DECANTER FOR FLEXI CONT (MISCELLANEOUS) IMPLANT
BAG SNAP BAND KOVER 36X36 (MISCELLANEOUS) ×6 IMPLANT
BLADE OSCILLATING /SAGITTAL (BLADE) IMPLANT
BLADE STERNUM SYSTEM 6 (BLADE) ×3 IMPLANT
BLADE SURG ROTATE 9660 (MISCELLANEOUS) IMPLANT
CABLE PACING FASLOC BIEGE (MISCELLANEOUS) IMPLANT
CABLE PACING FASLOC BLUE (MISCELLANEOUS) ×3 IMPLANT
CANNULA FEM VENOUS REMOTE 22FR (CANNULA) IMPLANT
CANNULA OPTISITE PERFUSION 16F (CANNULA) IMPLANT
CANNULA OPTISITE PERFUSION 18F (CANNULA) IMPLANT
CATH DIAG EXPO 6F AL2 (CATHETERS) ×3 IMPLANT
CATH DIAG EXPO 6F VENT PIG 145 (CATHETERS) ×6 IMPLANT
CATH S G BIP PACING (SET/KITS/TRAYS/PACK) ×6 IMPLANT
CATH STRAIGHT 5FR 65CM (CATHETERS) ×3 IMPLANT
CLIP TI MEDIUM 24 (CLIP) ×3 IMPLANT
CLIP TI WIDE RED SMALL 24 (CLIP) ×3 IMPLANT
CONT SPEC 4OZ CLIKSEAL STRL BL (MISCELLANEOUS) ×9 IMPLANT
COVER BACK TABLE 24X17X13 BIG (DRAPES) ×3 IMPLANT
COVER DOME SNAP 22 D (MISCELLANEOUS) ×3 IMPLANT
COVER MAYO STAND STRL (DRAPES) ×3 IMPLANT
COVER TABLE BACK 60X90 (DRAPES) ×3 IMPLANT
CRADLE DONUT ADULT HEAD (MISCELLANEOUS) ×3 IMPLANT
DERMABOND ADHESIVE PROPEN (GAUZE/BANDAGES/DRESSINGS) ×2
DERMABOND ADVANCED (GAUZE/BANDAGES/DRESSINGS) ×2
DERMABOND ADVANCED .7 DNX12 (GAUZE/BANDAGES/DRESSINGS) ×1 IMPLANT
DERMABOND ADVANCED .7 DNX6 (GAUZE/BANDAGES/DRESSINGS) ×1 IMPLANT
DRAPE INCISE IOBAN 66X45 STRL (DRAPES) IMPLANT
DRAPE SLUSH MACHINE 52X66 (DRAPES) ×3 IMPLANT
DRAPE TABLE COVER HEAVY DUTY (DRAPES) ×3 IMPLANT
DRSG TEGADERM 4X4.75 (GAUZE/BANDAGES/DRESSINGS) ×3 IMPLANT
ELECT REM PT RETURN 9FT ADLT (ELECTROSURGICAL) ×6
ELECTRODE REM PT RTRN 9FT ADLT (ELECTROSURGICAL) ×2 IMPLANT
FELT TEFLON 6X6 (MISCELLANEOUS) ×3 IMPLANT
FEMORAL VENOUS CANN RAP (CANNULA) IMPLANT
GAUZE SPONGE 4X4 12PLY STRL (GAUZE/BANDAGES/DRESSINGS) ×3 IMPLANT
GLOVE BIOGEL PI IND STRL 6.5 (GLOVE) ×2 IMPLANT
GLOVE BIOGEL PI IND STRL 7.0 (GLOVE) ×2 IMPLANT
GLOVE BIOGEL PI INDICATOR 6.5 (GLOVE) ×4
GLOVE BIOGEL PI INDICATOR 7.0 (GLOVE) ×4
GLOVE ECLIPSE 8.0 STRL XLNG CF (GLOVE) ×6 IMPLANT
GLOVE EUDERMIC 7 POWDERFREE (GLOVE) ×6 IMPLANT
GLOVE ORTHO TXT STRL SZ7.5 (GLOVE) ×6 IMPLANT
GOWN STRL REUS W/ TWL LRG LVL3 (GOWN DISPOSABLE) ×3 IMPLANT
GOWN STRL REUS W/ TWL XL LVL3 (GOWN DISPOSABLE) ×6 IMPLANT
GOWN STRL REUS W/TWL LRG LVL3 (GOWN DISPOSABLE) ×6
GOWN STRL REUS W/TWL XL LVL3 (GOWN DISPOSABLE) ×12
GUIDEWIRE SAF TJ AMPL .035X180 (WIRE) ×3 IMPLANT
GUIDEWIRE STRAIGHT .035 260CM (WIRE) ×3 IMPLANT
INSERT FOGARTY 61MM (MISCELLANEOUS) ×3 IMPLANT
INSERT FOGARTY SM (MISCELLANEOUS) ×6 IMPLANT
INSERT FOGARTY XLG (MISCELLANEOUS) IMPLANT
KIT BASIN OR (CUSTOM PROCEDURE TRAY) ×3 IMPLANT
KIT DILATOR VASC 18G NDL (KITS) IMPLANT
KIT HEART LEFT (KITS) ×3 IMPLANT
KIT ROOM TURNOVER OR (KITS) ×3 IMPLANT
KIT SUCTION CATH 14FR (SUCTIONS) ×6 IMPLANT
NEEDLE PERC 18GX7CM (NEEDLE) ×3 IMPLANT
NS IRRIG 1000ML POUR BTL (IV SOLUTION) ×9 IMPLANT
PACK AORTA (CUSTOM PROCEDURE TRAY) ×3 IMPLANT
PAD ARMBOARD 7.5X6 YLW CONV (MISCELLANEOUS) ×6 IMPLANT
PAD ELECT DEFIB RADIOL ZOLL (MISCELLANEOUS) ×3 IMPLANT
SHEATH PINNACLE 6F 10CM (SHEATH) ×6 IMPLANT
SPONGE GAUZE 4X4 12PLY STER LF (GAUZE/BANDAGES/DRESSINGS) ×3 IMPLANT
SPONGE LAP 4X18 X RAY DECT (DISPOSABLE) ×3 IMPLANT
STOPCOCK 4 WAY LG BORE MALE ST (IV SETS) ×3 IMPLANT
STOPCOCK MORSE 400PSI 3WAY (MISCELLANEOUS) ×9 IMPLANT
SUT ETHIBOND X763 2 0 SH 1 (SUTURE) ×3 IMPLANT
SUT GORETEX CV 4 TH 22 36 (SUTURE) ×3 IMPLANT
SUT GORETEX CV4 TH-18 (SUTURE) ×9 IMPLANT
SUT GORETEX TH-18 36 INCH (SUTURE) ×6 IMPLANT
SUT MNCRL AB 3-0 PS2 18 (SUTURE) ×3 IMPLANT
SUT PROLENE 3 0 SH1 36 (SUTURE) IMPLANT
SUT PROLENE 4 0 RB 1 (SUTURE) ×2
SUT PROLENE 4-0 RB1 .5 CRCL 36 (SUTURE) ×1 IMPLANT
SUT PROLENE 5 0 C 1 36 (SUTURE) ×6 IMPLANT
SUT PROLENE 6 0 C 1 30 (SUTURE) ×6 IMPLANT
SUT SILK  1 MH (SUTURE) ×2
SUT SILK 1 MH (SUTURE) ×1 IMPLANT
SUT SILK 2 0 SH CR/8 (SUTURE) IMPLANT
SUT VIC AB 2-0 CT1 27 (SUTURE) ×2
SUT VIC AB 2-0 CT1 TAPERPNT 27 (SUTURE) ×1 IMPLANT
SUT VIC AB 2-0 CTX 36 (SUTURE) IMPLANT
SUT VIC AB 3-0 SH 8-18 (SUTURE) ×6 IMPLANT
SYR 30ML LL (SYRINGE) ×9 IMPLANT
SYR 50ML LL SCALE MARK (SYRINGE) ×3 IMPLANT
TAPE CLOTH SURG 4X10 WHT LF (GAUZE/BANDAGES/DRESSINGS) ×3 IMPLANT
TOWEL OR 17X26 10 PK STRL BLUE (TOWEL DISPOSABLE) ×6 IMPLANT
TRANSDUCER W/STOPCOCK (MISCELLANEOUS) ×9 IMPLANT
TRAY FOLEY IC TEMP SENS 16FR (CATHETERS) ×3 IMPLANT
TUBING HIGH PRESSURE 120CM (CONNECTOR) ×3 IMPLANT
VALVE HEART TRANSCATH SZ3 26MM (Prosthesis & Implant Heart) ×3 IMPLANT
WIRE .035 3MM-J 145CM (WIRE) ×3 IMPLANT
WIRE AMPLATZ SS-J .035X180CM (WIRE) ×3 IMPLANT
WIRE AMPLATZ SSTIFF .035X260CM (WIRE) ×3 IMPLANT

## 2015-05-15 NOTE — H&P (Signed)
[address]       [place] [address]             [phone]      Cardiothoracic Surgery History and Physical   Referring Provider is Martinique, Peter M, MD PCP is Donnajean Lopes, MD  Chief Complaint  Patient presents with  . Aortic Stenosis    severe...EVAL FOR TAVR    HPI:  The patient is a 81 year old gentleman with hyperlipidemia, HTN, CAD, symptomatic bradycardia now s/p PPM, and severe aortic stenosis. He has had severe AS for many years but was not considered a candidate for surgery due to his advanced age. He was admitted on 03/09/2015 with chest pain and ruled out for MI. An echo showed a mean AV gradient of 42 and an AVA of 0.66 cm2. LV function was normal. He has a history of CAD with remote stent placement in 1999 and last cath in January 2013 which showed mild to moderate coronary disease with severe ostial diagonal stenosis not felt to be amenable to PCI. A cardiac cath on 04/11/2015 showed mild to moderate non-obstructive CAD with severe AS and a mean gradient of 41 mm Hg. He has a 4.3 cm abdominal aortic aneurysm followed by Dr. Kellie Simmering. A CT in October 2015 was unchanged. He was seen by pulmonary previously for pulmonary nodule but this was felt to be unchanged from 5 years ago. He is followed by Dr. Karsten Ro for a renal tumor that has been stable.   He has been active until about 2 months ago when he started having chest pressure and shortness of breath with minimal exertion. He has been very tired and can't play golf any longer. He lives with his wife. His son and daughter in law are with him today.   Past Medical History  Diagnosis Date  . CAD (coronary artery disease)     Remote stent to LAD in 1999. Last cath in 2004 and he is managed medically  . Anemia   . Hyperlipidemia   . Pacemaker     due to bradycardia; placed in 2004  . Aortic stenosis   . BPH (benign prostatic hyperplasia)   . HTN  (hypertension)   . Colon polyps   . Blood transfusion   . GERD (gastroesophageal reflux disease)   . Hemorrhoid   . Complication of anesthesia   . Dysrhythmia     paced  . Arthritis 09/18/11    "in my knees"  . Basal cell carcinoma of skin   . AAA (abdominal aortic aneurysm)   . Lung nodule     RLL  . Renal mass, left     Past Surgical History  Procedure Laterality Date  . Nasal hemorrhage control  1980's  . Insert / replace / remove pacemaker  2004    initial placement; [employer]  . Insert / replace / remove pacemaker  02/14/2011  . Cardiac catheterization  2004    Managed medically  . Coronary angioplasty with stent placement  05/09/1998    "1"; LAD  . Tonsillectomy      "when I was a teenager"  . Skin cancer excision  09/18/11    "have had a couple hundred of them removed since I was in my 59's"  . Cardiac catheterization    . Coronary angiogram  09/19/2011    Procedure: CORONARY ANGIOGRAM; Surgeon: Josue Hector, MD; Location: [facility]; Service: Cardiovascular;;  . Cardiac catheterization N/A 04/11/2015  Procedure: Right/Left Heart Cath and Coronary Angiography; Surgeon: Burnell Blanks, MD; Location: [facility]; Service: Cardiovascular; Laterality: N/A;    Family History  Problem Relation Age of Onset  . Cancer Father     stomach  . Colon cancer Father   . Heart disease Brother   . Heart disease Brother   . Diabetes Other     granddaughter    History   Social History  . Marital Status: Married    Spouse Name: N/A  . Number of Children: 2  . Years of Education: N/A   Occupational History  . Retired     Building control surveyor   Social History Main Topics  . Smoking status: Former Smoker -- 1.00 packs/day for 42 years    Types: Cigarettes    Quit date: 09/15/1973  .  Smokeless tobacco: Former Systems developer    Types: Chew  . Alcohol Use: No  . Drug Use: No  . Sexual Activity: Yes   Other Topics Concern  . Not on file   Social History Narrative    Current Outpatient Prescriptions  Medication Sig Dispense Refill  . acetaminophen (TYLENOL) 325 MG tablet Take 650 mg by mouth every 6 (six) hours as needed for mild pain, moderate pain or fever.     . clopidogrel (PLAVIX) 75 MG tablet Take 75 mg by mouth daily.     Marland Kitchen esomeprazole (NEXIUM) 40 MG capsule Take 40 mg by mouth daily.    . ferrous sulfate 325 (65 FE) MG tablet Take 325 mg by mouth every evening.     . rosuvastatin (CRESTOR) 5 MG tablet Take 5 mg by mouth daily.    . silodosin (RAPAFLO) 4 MG CAPS capsule Take 4 mg by mouth every evening.     Marland Kitchen amLODipine (NORVASC) 5 MG tablet Take 1 tablet (5 mg total) by mouth daily. 30 tablet 2   No current facility-administered medications for this visit.    Allergies  Allergen Reactions  . Aspirin Other (See Comments)    Stomach bleeds      Review of Systems:  General:normal appetite, decreased energy, no weight gain, no weight loss, no fever Cardiac:has chest pain with exertion, no chest pain at rest, has SOB with mild exertion, no resting SOB, no PND, no orthopnea, no palpitations, no arrhythmia, no atrial fibrillation, no LE edema, has dizzy spells, no syncope Respiratory:Has exertional shortness of breath, no home oxygen, no productive cough, no dry cough, no bronchitis, no wheezing, no hemoptysis, no asthma, no pain with inspiration or cough, no sleep apnea, no CPAP at night GI:no difficulty swallowing, no reflux, no frequent heartburn, no hiatal hernia, no abdominal pain, no constipation, no diarrhea, no hematochezia, no hematemesis, no  melena GU:no dysuria, no frequency, no urinary tract infection, no hematuria, no enlarged prostate, no kidney stones, no kidney disease Vascular:no pain suggestive of claudication, no pain in feet, no leg cramps, no varicose veins, no DVT, no non-healing foot ulcer Neuro:no stroke, no TIA's, no seizures, no headaches, no temporary blindness one eye, no slurred speech, no peripheral neuropathy, no chronic pain, no instability of gait, no memory/cognitive dysfunction Musculoskeletal:no arthritis, no joint swelling, no myalgias, no difficulty walking, no mobility  Skin:no rash, no itching, no skin infections, no pressure sores or ulcerations Psych:No anxiety, no depression, no nervousness, no unusual recent stress Eyes:has blurry vision, no floaters, no recent vision changes, wears glasses or contacts ENT:has hearing loss, no loose or painful teeth, no dentures, last saw dentist this year  Hematologic: easy bruising, no abnormal bleeding, no clotting disorder, no frequent epistaxis Endocrine:no diabetes, does not check CBG's at home     Physical Exam:  BP 136/74 mmHg  Pulse 86  Resp 16  Ht 5\' 4"  (1.626 m)  Wt 155 lb (70.308 kg)  BMI 26.59 kg/m2  SpO2 98% General:Elderly, well-appearing gentleman who looks good for his age HEENT:Unremarkable , NCAT, PERLA, EOMI, oropharynx clear Neck:no JVD, no bruits, no adenopathy or thyromegaly Chest:clear to auscultation, symmetrical  breath sounds, no wheezes, no rhonchi  CV:RRR, grade III/VI crescendo/decrescendo murmur heard best at RSB, no diastolic murmur Abdomen:soft, non-tender, no masses or organomegaly Extremities:warm, well-perfused, pulses palpable in feet, no LE edema Rectal/GUDeferred Neuro:Grossly non-focal and symmetrical throughout Skin:Clean and dry, no rashes, no breakdown   Diagnostic Tests:         *[place]*         *[facility]*             [address]            [place], [place] [address]              [phone]  ------------------------------------------------------------------- Transthoracic Echocardiography  Patient:  Abdulla, Pooley MR #:    [id number] Study Date: 03/10/2015 Gender:   M Age:    18 Height:   162.6 cm Weight:   69.9 kg BSA:    1.79 m^2 Pt. Status: Room:    [address] [place] Foster [address] [place] [address], [place], [place] PERFORMING  [facility], Inpatient SONOGRAPHER Minus Breeding  cc:  ------------------------------------------------------------------- LV EF: 60% -  65%  ------------------------------------------------------------------- Indications:   Chest pain 786.51.  ------------------------------------------------------------------- History:  PMH:  Coronary artery disease. Aortic valve disease. Risk factors: Dyslipidemia.  ------------------------------------------------------------------- Study Conclusions  - Left ventricle: The cavity size was normal. Wall thickness was increased in a pattern of mild LVH. Systolic function was normal. The estimated  ejection fraction was in the range of 60% to 65%. Wall motion was normal; there were no regional wall motion abnormalities. Doppler parameters are consistent with abnormal left ventricular relaxation (grade 1 diastolic dysfunction). - Aortic valve: Moderately calcified annulus. Trileaflet; moderately thickened leaflets. There was severe stenosis. Mean gradient (S): 42 mm Hg. VTI ratio of LVOT to aortic valve: 0.26. Valve area (VTI): 0.74 cm^2. Valve area (Vmax): 0.77 cm^2. Valve area (Vmean): 0.66 cm^2. - Mitral valve: Moderately calcified annulus. Moderately thickened leaflets . The findings are consistent with mild stenosis. There was mild regurgitation. Valve area by continuity equation (using LVOT flow): 2.14 cm^2. - Left atrium: The atrium was mildly dilated. - Technically difficult study.  ------------------------------------------------------------------- Labs, prior tests, procedures, and surgery: Permanent pacemaker system implantation.  Transthoracic echocardiography. M-mode, complete 2D, spectral Doppler, and color Doppler. Birthdate: Patient birthdate: 1918/03/27. Age: Patient is 81 yr old. Sex: Gender: male. BMI: 26.4 kg/m^2. Blood pressure:   128/70 Patient status: Inpatient. Study date: Study date: 03/10/2015. Study time: 07:59 AM. Location: Bedside.  -------------------------------------------------------------------  ------------------------------------------------------------------- Left ventricle: The cavity size was normal. Wall thickness was increased in a pattern of mild LVH. Systolic function was normal. The estimated ejection fraction was in the range of 60% to 65%. Wall motion was normal; there were no regional wall motion abnormalities. Doppler parameters are consistent with abnormal left ventricular relaxation (grade 1 diastolic dysfunction).  ------------------------------------------------------------------- Aortic  valve:  Moderately calcified annulus. Trileaflet; moderately thickened leaflets. Doppler:  There was  severe stenosis.  There was no significant regurgitation.  VTI ratio of LVOT to aortic valve: 0.26. Valve area (VTI): 0.74 cm^2. Indexed valve area (VTI): 0.41 cm^2/m^2. Peak velocity ratio of LVOT to aortic valve: 0.27. Valve area (Vmax): 0.77 cm^2. Indexed valve area (Vmax): 0.43 cm^2/m^2. Mean velocity ratio of LVOT to aortic valve: 0.26. Valve area (Vmean): 0.66 cm^2. Indexed valve area (Vmean): 0.37 cm^2/m^2.  Mean gradient (S): 42 mm Hg. Peak gradient (S): 69 mm Hg.  ------------------------------------------------------------------- Aorta: Aortic root: The aortic root was normal in size.  ------------------------------------------------------------------- Mitral valve:  Moderately calcified annulus. Moderately thickened leaflets . Doppler:  The findings are consistent with mild stenosis.  There was mild regurgitation.  Valve area by continuity equation (using LVOT flow): 2.14 cm^2. Indexed valve area by continuity equation (using LVOT flow): 1.19 cm^2/m^2. Mean gradient (D): 4 mm Hg. Peak gradient (D): 4 mm Hg.  ------------------------------------------------------------------- Left atrium: The atrium was mildly dilated.  ------------------------------------------------------------------- Right ventricle: The cavity size was normal. Wall thickness was normal. Systolic function was normal.  ------------------------------------------------------------------- Pulmonic valve:  Not well visualized. Doppler:  There was no evidence for stenosis.  There was mild regurgitation.  ------------------------------------------------------------------- Tricuspid valve: Not well visualized. Doppler:  There was no evidence for stenosis.  There was mild regurgitation.  ------------------------------------------------------------------- Pulmonary artery:  Systolic  pressure could not be accurately estimated.  Inadequate TR jet.  ------------------------------------------------------------------- Right atrium: The atrium was normal in size.  ------------------------------------------------------------------- Pericardium: There was no pericardial effusion.  ------------------------------------------------------------------- Systemic veins: IVC is poorly visualized.  ------------------------------------------------------------------- Measurements  Left ventricle              Value      Reference LV ID, ED, PLAX chordal     (L)   31.2  mm    43 - 52 LV ID, ES, PLAX chordal     (L)   22.3  mm    23 - 38 LV fx shortening, PLAX chordal      29   %    >=29 LV PW thickness, ED           11   mm    --------- IVS/LV PW ratio, ED           0.09      <=1.3  Ventricular septum            Value      Reference IVS thickness, ED            1   mm    ---------  LVOT                   Value      Reference LVOT ID, S                18   mm    --------- LVOT area                2.54  cm^2   --------- LVOT peak velocity, S          111  cm/s   --------- LVOT mean velocity, S          80.5  cm/s   --------- LVOT VTI, S               25   cm    ---------  Aortic valve               Value      Reference Aortic valve  peak velocity, S      415.9 cm/s   --------- Aortic valve mean velocity, S      308.43 cm/s   --------- Aortic valve VTI, S           94.48 cm    --------- Aortic mean gradient, S         42   mm Hg  --------- Aortic peak gradient, S         69   mm Hg  --------- VTI ratio, LVOT/AV            0.26       --------- Aortic valve area, VTI          0.74  cm^2   --------- Aortic valve area/bsa, VTI        0.41  cm^2/m^2 --------- Velocity ratio, peak, LVOT/AV      0.27      --------- Aortic valve area, peak velocity     0.77  cm^2   --------- Aortic valve area/bsa, peak       0.43  cm^2/m^2 --------- velocity Velocity ratio, mean, LVOT/AV      0.26      --------- Aortic valve area, mean velocity     0.66  cm^2   --------- Aortic valve area/bsa, mean       0.37  cm^2/m^2 --------- velocity  Aorta                  Value      Reference Aortic root ID, ED            32   mm    ---------  Left atrium               Value      Reference LA ID, A-P, ES              35   mm    --------- LA ID/bsa, A-P              1.95  cm/m^2  <=2.2  Mitral valve               Value      Reference Mitral E-wave peak velocity       101  cm/s   --------- Mitral A-wave peak velocity       157  cm/s   --------- Mitral mean velocity, D         87.76 cm/s   --------- Mitral deceleration time     (H)   437  ms    150 - 230 Mitral mean gradient, D         4   mm Hg  --------- Mitral peak gradient, D         4   mm Hg  --------- Mitral E/A ratio, peak          0.6       --------- Mitral valve area, LVOT         2.14  cm^2   --------- continuity Mitral valve area/bsa, LVOT       1.19  cm^2/m^2 --------- continuity  Systemic veins              Value      Reference Estimated CVP              3   mm Hg  ---------  Right ventricle             Value  Reference RV s&', lateral, S            11.5  cm/s   ---------  Legend: (L)  and (H) mark values outside specified reference range.  ------------------------------------------------------------------- Prepared and Electronically Authenticated by  Kerry Hough, M.D. 2016-06-25T13:46:39  Conclusion     Prox RCA lesion, 30% stenosed.  Prox LAD lesion, 40% stenosed.  Prox Cx lesion, 30% stenosed.  Severe aortic valve stenosis  1. Mild to moderate non-obstructive CAD  2. Severe aortic valve stenosis (mean gradient 41 mmHg, Peak to peak 59 mm Hg), AVA 1.35 cm2  Of note, this was a very difficult procedure with use of many catheters to attempt selective coronary engagement which was limited due to calcification and tortuosity of vessels.   Recommendations: He has severe AS. His CAD is stable. Will refer to see Dr. Cyndia Bent or Dr. Roxy Manns to discuss options for TAVR. If he is felt to be a good candidate, will proceed with CT scans and PFTs.      Coronary Findings    Dominance: Right   Left Anterior Descending   . Prox LAD lesion, 40% stenosed. diffuse .     Ramus Intermedius  The vessel is small .     Left Circumflex   . Prox Cx lesion, 30% stenosed. discrete .   Marland Kitchen First Obtuse Marginal Branch   The vessel is small in size.   Marland Kitchen Second Obtuse Marginal Branch   The vessel is small in size.     Right Coronary Artery   . Prox RCA lesion, 30% stenosed. discrete .       Right Heart Pressures Hemodynamic findings consistent with aortic stenosis. Elevated LV EDP consistent with volume overload.    Left Heart    Aortic Valve There is severe aortic valve stenosis. The aortic valve is calcified.    Coronary Diagrams    Diagnostic Diagram            Implants    Name ID Temporary Type Supply   No information to display    Hemo Data       Most Recent Value   Fick Cardiac Output  6.33 L/min   Fick Cardiac Output Index  3.62 (L/min)/BSA    Aortic Mean Gradient  41.1 mmHg   Aortic Peak Gradient  59 mmHg   Aortic Valve Area  1.35   Aortic Value Area Index  0.77 cm2/BSA   RA A Wave  10 mmHg   RA V Wave  8 mmHg   RA Mean  7 mmHg   RV Systolic Pressure  45 mmHg   RV Diastolic Pressure  3 mmHg   RV EDP  11 mmHg   PA Systolic Pressure  35 mmHg   PA Diastolic Pressure  13 mmHg   PA Mean  23 mmHg   PW A Wave  20 mmHg   PW V Wave  18 mmHg   PW Mean  12 mmHg   AO Systolic Pressure  638 mmHg   AO Diastolic Pressure  55 mmHg   AO Mean  79 mmHg   LV Systolic Pressure  756 mmHg   LV Diastolic Pressure  13 mmHg   LV EDP  21 mmHg   Arterial Occlusion Pressure Extended Systolic Pressure  96 mmHg   Arterial Occlusion Pressure Extended Diastolic Pressure  55 mmHg   Arterial Occlusion Pressure Extended Mean Pressure  73 mmHg   Left Ventricular Apex Extended Systolic Pressure  433 mmHg   Left  Ventricular Apex Extended Diastolic Pressure  15 mmHg   Left Ventricular Apex Extended EDP Pressure  20 mmHg   QP/QS  1   TPVR Index  6.36 HRUI   TSVR Index  21.84 HRUI   PVR SVR Ratio  0.15   TPVR/TSVR Ratio  0.29    Order-Level Documents:    There are no order-level documents.    Encounter-Level Documents - 03/22/15:      Scan on 04/12/2015 10:52 AM by Provider Default, MDScan on 04/12/2015 10:52 AM by Provider Default, MD     Scan on 04/12/2015 10:23 AM by Provider Default, MDScan on 04/12/2015 10:23 AM by Provider Default, MD     Scan on 04/11/2015 10:11 AM by Provider Default, MDScan on 04/11/2015 10:11 AM by Provider Default, MD     Electronic signature on 04/11/2015 6:17 AM    Signed    Electronically signed by Burnell Blanks, MD on 04/11/15 at 1030 EDT   Cardiac TAVR CT  TECHNIQUE: The patient was scanned on a Philips 256 scanner.  A 120 kV retrospective scan was triggered in the descending thoracic aorta at 111 HU's. Gantry rotation speed was 270 msecs and collimation was .9 mm. 5 mg of iv Metoprolol and no nitro were given. The 3D data set was reconstructed in 5% intervals of the R-R cycle. Systolic and diastolic phases were analyzed on a dedicated work station using MPR, MIP and VRT modes. The patient received 80 cc of contrast.  FINDINGS: Aortic Valve: Severely thickened and calcified with severely restricted leaflet opening. There are only mild sub-valvular calcifications.  Aorta: Normal caliber, no dissection. There are mild diffuse calcifications in the ascending and descending aorta and moderate diffuse calcifications on the aortic arch.  Sinotubular Junction: 30 x 30 mm  Ascending Thoracic Aorta: 37 x 36 mm  Aortic Arch: 28 x 26 mm  Descending Thoracic Aorta: 28 x 28 mm  Sinus of Valsalva Measurements:  Non-coronary: 33 mm  Right -coronary: 35 mm  Left -coronary: 33 mm  Coronary Artery Height above Annulus:  Left Main: 11 mm  Right Coronary: 15 mm  Virtual Basal Annulus Measurements:  Maximum/Minimum Diameter: 440 mm2  Perimeter: 89 mm  Area: 26 x 22 mm  Optimum Fluoroscopic Angle for Delivery: LAO 1 CAU 1  IMPRESSION: 1. Severely thickened and calcified aortic valve with severely restricted leaflet opening and annular measurement suitable for deployment of 26 mm Edward-SAPIEN 3 valve.  2. Sufficient annulus to coronary distance.  3. Optimum Fluoroscopic Angle for Delivery is LAO 1 CAU 1.  Ena Dawley   Electronically Signed  By: Ena Dawley  On: 04/27/2015 12:24      Study Result     CLINICAL DATA: 81 year old male with history of severe aortic stenosis. Preprocedural study prior to potential transcatheter aortic valve replacement (TAVR) procedure.  EXAM: CT ANGIOGRAPHY CHEST, ABDOMEN AND  PELVIS  TECHNIQUE: Multidetector CT imaging through the chest, abdomen and pelvis was performed using the standard protocol during bolus administration of intravenous contrast. Multiplanar reconstructed images and MIPs were obtained and reviewed to evaluate the vascular anatomy.  CONTRAST: 44mL OMNIPAQUE IOHEXOL 350 MG/ML SOLN  COMPARISON: CT of the abdomen and pelvis 07/11/2014.  FINDINGS: CTA CHEST FINDINGS  Mediastinum/Lymph Nodes: Heart size is borderline enlarged. There is no significant pericardial fluid, thickening or pericardial calcification. Severe thickening calcifications of the aortic valve. Severe thickening calcifications of the mitral valve and mitral annulus. There is atherosclerosis of the thoracic aorta, the great vessels of the  mediastinum and the coronary arteries, including calcified atherosclerotic plaque in the left main, left anterior descending, left circumflex and right coronary arteries. Right-sided pacemaker device in position with lead tips terminating in the right atrium and right ventricular apex. No pathologically enlarged mediastinal or hilar lymph nodes. Esophagus is normal in appearance. No axillary lymphadenopathy.  Lungs/Pleura: 9 mm right lower lobe nodule (image 40 of series 403), unchanged compared to prior study 01/13/2013, considered benign. Calcified granuloma in the right lower lobe peripherally. No other suspicious appearing pulmonary nodules or masses. No acute consolidative airspace disease. No pleural effusions. Small calcified pleural plaques are noted throughout the thorax bilaterally, suggesting underlying asbestos related pleural disease. No fibrotic changes are noted in the lungs to suggest asbestosis at this time.  Musculoskeletal/Soft Tissues: There are no aggressive appearing lytic or blastic lesions noted in the visualized portions of the skeleton.  CTA ABDOMEN AND PELVIS FINDINGS  Hepatobiliary: No  cystic or solid hepatic lesions. No intra or extrahepatic biliary ductal dilatation. Tiny calcified gallstones layer dependently in the gallbladder. No current findings to suggest an acute cholecystitis at this time.  Pancreas: No pancreatic mass. No pancreatic ductal dilatation. No pancreatic or peripancreatic fluid or inflammatory changes.  Spleen: Unremarkable.  Adrenals/Urinary Tract: Slight interval enlargement of an exophytic enhancing lesion in the lower pole of the left kidney (image 175 of series 401), which measures 2.0 x 2.4 cm, concerning for a small renal cell carcinoma. Multiple other low-attenuation lesions are noted in the kidneys bilaterally, similar in size, number and distribution to prior examinations. The smallest of these are too small to characterize, but are statistically favored to represent tiny cysts. The largest of these lesions are all compatible with simple cysts, including the largest lesion which measures 2.4 cm in the interpolar region of the right kidney. Bilateral adrenal glands are normal in appearance. No hydroureteronephrosis. Urinary bladder is remarkable for innumerable bladder diverticulae.  Stomach/Bowel: The appearance of the stomach is normal. No pathologic dilatation of small bowel or colon. Several colonic diverticulae are noted, particularly in the sigmoid colon, without surrounding inflammatory changes to suggest an acute diverticulitis at this time.  Vascular/Lymphatic: Vascular findings and measurements pertinent to potential TAVR procedure, as detailed below. In addition, there is a focal fusiform aneurysmal dilatation of the infrarenal abdominal aorta which measures up to 4.5 x 4.1 cm (unchanged compared with 07/11/2014). No lymphadenopathy noted in the abdomen or pelvis.  Reproductive: Prostate gland is enlarged and heterogeneous appearing measuring 5.6 x 6.0 cm. Seminal vesicles are unremarkable in appearance.  Other:  No significant volume of ascites. No pneumoperitoneum.  Musculoskeletal: There are no aggressive appearing lytic or blastic lesions noted in the visualized portions of the skeleton.  VASCULAR MEASUREMENTS PERTINENT TO TAVR:  AORTA:  Minimal Aortic Diameter - 18 x 20 mm  Severity of Aortic Calcification - moderate to severe  RIGHT PELVIS:  Right Common Iliac Artery -  Minimal Diameter - 10.3 x 9.7 mm  Tortuosity - severe  Calcification - moderate  Right External Iliac Artery -  Minimal Diameter - 8.8 x 6.9 mm  Tortuosity - moderate  Calcification - mild to moderate  Right Common Femoral Artery -  Minimal Diameter - 8.9 x 3.8 mm large eccentric calcified plaque protruding into the lumen causing focal narrowing  Tortuosity - mild  Calcification - moderate to severe  LEFT PELVIS:  Left Common Iliac Artery -  Minimal Diameter - 10.5 x 11.1 mm  Tortuosity - moderate  Calcification - moderate  Left External Iliac Artery -  Minimal Diameter - 8.9 x 8.8 mm  Tortuosity - mild  Calcification - mild  Left Common Femoral Artery -  Minimal Diameter - 7.3 x 7.2 mm  Tortuosity - mild  Calcification - moderate  Review of the MIP images confirms the above findings.  IMPRESSION: 1. Vascular findings and measurements pertinent to potential TAVR procedure, as detailed above. This patient does appear to have suitable pelvic arterial access from the left side. On the right side, the combination of extensive calcified atheromatous plaque which distorts the right common femoral artery and extreme tortuosity of the right common iliac artery prevent appropriate vascular access for this procedure. 2. Extensive thickening calcification of the aortic valve, compatible with the reported clinical history of aortic stenosis. There is also severe thickening calcification of the mitral valve and mitral annulus. 3. 4.1 x 4.5 cm fusiform  infrarenal abdominal aortic aneurysm is unchanged in size compared to prior examinations. 4. Interval enlargement of a 2.0 x 2.4 cm enhancing lesion in the lower pole of the left kidney again highly concerning for renal cell carcinoma. 5. Calcified pleural plaques in the thorax bilaterally, suggesting underlying asbestos related pleural disease. No findings in the lungs to suggest asbestosis at this time. 6. 9 mm right lower lobe nodules unchanged compared to 01/13/2013, considered benign, presumably a noncalcified granuloma. 7. Additional incidental findings, as above.  Electronically Signed: By: Vinnie Langton M.D. On: 04/25/2015 13:36       Pulmonary Function Tests    FVC2.09 L (93% predicted) FEV11.66 L (119% predicted) FEF25-751.58 L (254% predicted) TLC4.42 L (77% predicted) RV2.05 L (79% predicted) DLCO76% predicted           STS Risk Score:  Risk of Mortality: 6.838%  Morbidity or Mortality: 21.729%  Long Length of Stay: 9.292%   Impression:  I have personally reviewed his echo and cardiac cath films, cardiac CT and CTA of the chest, abdomen and pelvis.This 81 year old gentleman has stage D severe symptomatic aortic stenosis with non-obstructive coronary artery disease. I think he would be a high risk candidate for open surgical AVR due to his advanced age. Cardiac gated CT angiogram of the heart demonstrates anatomical findings favorable for transcatheter aortic valve replacement using a 26 mm Edwards Sapien 3 transcatheter heart valve without any complicating features. The patient appears to have adequate pelvic vascular access for transfemoral approach, although the patient does have a small infrarenal abdominal aortic aneurysm, tortuous common iliac arteries bilaterally, and  significant calcification and disease involving both femoral arteries.   Following the decision to proceed with transcatheter aortic valve replacement, a discussion has been held regarding what types of management strategies would be attempted intraoperatively in the event of life-threatening complications, including whether or not the patient would be considered a candidate for the use of cardiopulmonary bypass and/or conversion to open sternotomy for attempted surgical intervention. The patient has been advised of a variety of complications that might develop including but not limited to risks of death, stroke, paravalvular leak, aortic dissection or other major vascular complications, aortic annulus rupture, device embolization, cardiac rupture or perforation, mitral regurgitation, acute myocardial infarction, arrhythmia, heart block or bradycardia requiring permanent pacemaker placement, congestive heart failure, respiratory failure, renal failure, pneumonia, infection, other late complications related to structural valve deterioration or migration, or other complications that might ultimately cause a temporary or permanent loss of functional independence or other long term morbidity. The patient provides full informed consent for the procedure as  described and all questions were answered.    Plan:  Left transfemoral TAVR using a 26 mm Sapien 3 valve.  Gaye Pollack, MD

## 2015-05-15 NOTE — Progress Notes (Signed)
  Echocardiogram Echocardiogram Transesophageal has been performed.  Jennette Dubin 05/15/2015, 1:48 PM

## 2015-05-15 NOTE — Anesthesia Postprocedure Evaluation (Signed)
  Anesthesia Post-op Note  Patient: Jordan [name]  Procedure(s) Performed: Procedure(s): TRANSCATHETER AORTIC VALVE REPLACEMENT, TRANSFEMORAL (N/A) TRANSESOPHAGEAL ECHOCARDIOGRAM (TEE) (N/A)  Patient Location: SICU  Anesthesia Type:General  Level of Consciousness: awake, alert  and oriented  Airway and Oxygen Therapy: Patient Spontanous Breathing and Patient connected to nasal cannula oxygen  Post-op Pain: mild  Post-op Assessment: Post-op Vital signs reviewed, Patient's Cardiovascular Status Stable, Respiratory Function Stable, Patent Airway and Pain level controlled              Post-op Vital Signs: stable  Last Vitals:  Filed Vitals:   05/15/15 1730  BP:   Pulse: 67  Temp: 35.5 C  Resp: 12    Complications: No apparent anesthesia complications

## 2015-05-15 NOTE — Anesthesia Procedure Notes (Signed)
Procedure Name: Intubation Date/Time: 05/15/2015 12:18 PM Performed by: Barrington Ellison Pre-anesthesia Checklist: Patient identified, Emergency Drugs available, Suction available, Patient being monitored and Timeout performed Patient Re-evaluated:Patient Re-evaluated prior to inductionOxygen Delivery Method: Circle system utilized Intubation Type: IV induction and Cricoid Pressure applied Ventilation: Mask ventilation without difficulty and Oral airway inserted - appropriate to patient size Laryngoscope Size: Mac and 4 Grade View: Grade I Tube type: Oral Tube size: 8.0 mm Number of attempts: 1 Airway Equipment and Method: Stylet and Oral airway Placement Confirmation: ETT inserted through vocal cords under direct vision,  positive ETCO2 and breath sounds checked- equal and bilateral Secured at: 21 cm Tube secured with: Tape Dental Injury: Teeth and Oropharynx as per pre-operative assessment  Comments: Intubated by Merrily Brittle, with CRNA and MDA supervision.

## 2015-05-15 NOTE — CV Procedure (Signed)
[facility]  TAVR OPERATIVE NOTE   Date of Procedure:  05/15/2015  Preoperative Diagnosis: Severe Aortic Stenosis   Postoperative Diagnosis: Same   Procedure:    Transcatheter Aortic Valve Replacement - Transfemoral Approach  Edwards Sapien 3 THV (size 26 mm,serial # [id number], model [id number])   Co-Surgeons:  Gilford Raid, MD and Lauree Chandler, MD  Assistants:   Darylene Price, MD  Anesthesiologist:  Lyda Perone  Echocardiographer:  Aundra Dubin  Pre-operative Echo Findings:  Severe aortic stenosis  Normal left ventricular systolic function  Post-operative Echo Findings:  No paravalvular leak  Normal left ventricular systolic function    BRIEF CLINICAL NOTE AND INDICATIONS FOR SURGERY  81 yo male with history of CAD, HLD, anemia, symptomatic bradycardia now s/p PPM placement, HTN, BPH, GERD and severe aortic valve stenosis who is here today for TAVR. His cardiac issues are followed by Dr. Martinique. He has been known to have severe aortic valve stenosis for many years but has not been considered a candidate for traditional AVR given his advanced age. He was admitted to [facility] 03/09/15 with c/o chest pain. Troponin was negative. Echo was repeated and showed continued aortic stenosis with a mean gradient of 42 mm Hg and AVA of 0.66cm2 with normal LV systolic function. He has a history of CAD with remote stent placement in 1999 and last cath in January 2013 which showed mild to moderate disease in the major epicardial vessels with severe ostial diagonal stenosis not felt to be amenable to PCI. Permanent pacemaker was placed in 2004 for symptomatic bradycardia. This has been followed by Dr. Lovena Le with replacement in June 2012. He has a 4.3 cm abdominal aortic aneurysm followed by Dr. Kellie Simmering. CT in October was unchanged. He was seen by pulmonary previously for pulmonary nodule but this was felt to be unchanged from 5 years ago. He is followed by Dr. Karsten Ro for a  renal tumor that has been stable.Cardiac cath with stable CAD.   During the course of the patient's preoperative work up they have been evaluated comprehensively by a multidisciplinary team of specialists coordinated through the [facility] in the [facility].  They have been demonstrated to suffer from symptomatic severe aortic stenosis as noted above. The patient has been counseled extensively as to the relative risks and benefits of all options for the treatment of severe aortic stenosis including long term medical therapy, conventional surgery for aortic valve replacement, and transcatheter aortic valve replacement.  The patient has been independently evaluated by two cardiac surgeons including Dr Roxy Manns and Dr. Cyndia Bent, and they are felt to be at high risk for conventional surgical aortic valve replacement. Both surgeons indicated the patient would be a poor candidate for conventional surgery because of comorbidities including advanced age.   Based upon review of all of the patient's preoperative diagnostic tests they are felt to be candidate for transcatheter aortic valve replacement using the transfemoral approach as an alternative to high risk conventional surgery.    Following the decision to proceed with transcatheter aortic valve replacement, a discussion has been held regarding what types of management strategies would be attempted intraoperatively in the event of life-threatening complications, including whether or not the patient would be considered a candidate for the use of cardiopulmonary bypass and/or conversion to open sternotomy for attempted surgical intervention.  The patient has been advised of a variety of complications that might develop peculiar to this approach including but not limited to risks of  death, stroke, paravalvular leak, aortic dissection or other major vascular complications, aortic annulus rupture, device embolization,  cardiac rupture or perforation, acute myocardial infarction, arrhythmia, heart block or bradycardia requiring permanent pacemaker placement, congestive heart failure, respiratory failure, renal failure, pneumonia, infection, other late complications related to structural valve deterioration or migration, or other complications that might ultimately cause a temporary or permanent loss of functional independence or other long term morbidity.  The patient provides full informed consent for the procedure as described and all questions were answered preoperatively.  DETAILS OF THE OPERATIVE PROCEDURE  PREPARATION:    The patient is brought to the operating room on the above mentioned date and central monitoring was established by the anesthesia team including placement of Swan-Ganz catheter and radial arterial line. The patient is placed in the supine position on the operating table.  Intravenous antibiotics are administered. General endotracheal anesthesia is induced uneventfully. A Foley catheter is placed.  Baseline transesophageal echocardiogram was performed. The patient's chest, abdomen, both groins, and both lower extremities are prepared and draped in a sterile manner. A time out procedure is performed.   PERIPHERAL ACCESS:    Using the modified Seldinger technique, femoral arterial and venous access was obtained with placement of 6 Fr sheaths on the right side.  A pigtail diagnostic catheter was passed through the right femoral arterial sheath under fluoroscopic guidance into the aortic root.  A temporary transvenous pacemaker catheter was passed through the right femoral venous sheath under fluoroscopic guidance into the right ventricle.  The pacemaker was tested to ensure stable lead placement and pacemaker capture. Aortic root angiography was performed in order to determine the optimal angiographic angle for valve deployment.   TRANSFEMORAL ACCESS:   A left femoral arterial cutdown was  performed by Dr Cyndia Bent. Please see his separate operative note for details. The patient was heparinized systemically and ACT verified > 250 seconds.    A 14 Fr transfemoral E-sheath was introduced into the right femoral artery after progressively dilating over an Amplatz superstiff wire. An AL-1 catheter was used to direct a straight-tip exchange length wire across the native aortic valve into the left ventricle. This was exchanged out for a pigtail catheter and position was confirmed in the LV apex. Simultaneous LV and Ao pressures were recorded.  The pigtail catheter was then exchanged for an Amplatz Extra-stiff wire in the LV apex. At that point, BAV was performed using a 23 mm valvuloplasty balloon.  Once optimal position was achieved, BAV was done under rapid ventricular pacing at 180 bpm. The patient recovered well hemodynamically.   TRANSCATHETER HEART VALVE DEPLOYMENT:  An Edwards Sapien 3 THV (size 26 mm) was prepared and crimped per manufacturer's guidelines, and the proper orientation of the valve is confirmed on the [employer] delivery system. The valve was advanced through the introducer sheath using normal technique until in an appropriate position in the abdominal aorta beyond the sheath tip. The balloon was then retracted and using the fine-tuning wheel was centered on the valve. The valve was then advanced across the aortic arch using appropriate flexion of the catheter. The valve was carefully positioned across the aortic valve annulus. The Commander catheter was retracted using normal technique. Once final position of the valve has been confirmed by angiographic assessment, the valve is deployed while temporarily holding ventilation and during rapid ventricular pacing to maintain systolic blood pressure < 50 mmHg and pulse pressure < 10 mmHg. The balloon inflation is held for >3 seconds after reaching full  deployment volume. Once the balloon has fully deflated the balloon is retracted  into the ascending aorta and valve function is assessed using TEE. There is felt to be no paravalvular leak and no central aortic insufficiency.  The patient's hemodynamic recovery following valve deployment is good.  The deployment balloon and guidewire are both removed. Echo demostrated acceptable post-procedural gradients, stable mitral valve function, and no AI.   PROCEDURE COMPLETION:  The sheath was then removed and arteriotomy repaired by Dr Cyndia Bent. Please see his separate report for details. Distal abdominal aortography was performed to evaluate for any arterial injury related to the procedure. There was no evidence dissection, perforation, or other vascular injury in the abdominal aorta, iliac artery, or femoral artery.  Protamine was administered once femoral arterial repair was complete. The temporary pacemaker, pigtail catheters and femoral sheaths were removed with manual pressure used for hemostasis.   The patient tolerated the procedure well and is transported to the surgical intensive care in stable condition. There were no immediate intraoperative complications. All sponge instrument and needle counts are verified correct at completion of the operation.   No blood products were administered during the operation.  The patient received a total of 99 mL of intravenous contrast during the procedure.  MCALHANY,CHRISTOPHER MD 05/15/2015 2:11 PM

## 2015-05-15 NOTE — Progress Notes (Signed)
Utilization Review Completed.Donne Anon T8/30/2016

## 2015-05-15 NOTE — Op Note (Signed)
CARDIOTHORACIC SURGERY OPERATIVE NOTE  Date of Procedure:  05/15/2015  Preoperative Diagnosis: Severe Aortic Stenosis   Postoperative Diagnosis: Same   Procedure:    Transcatheter Aortic Valve Replacement - Left Transfemoral Approach  Edwards Sapien 3 Transcatheter Heart Valve (size 26 mm, model # 9600TFX, serial # [id number])   Co-Surgeons:  Gaye Pollack, MD and Lauree Chandler, MD  Assistants:   Valentina Gu. Roxy Manns, MD   Anesthesiologist:  Roberts Gaudy, MD  Echocardiographer:  Loralie Champagne, MD  Pre-operative Echo Findings:   severe aortic stenosis   normal left ventricular systolic function   trivial MR  Post-operative Echo Findings:  no paravalvular leak  normal left ventricular systolic function  Unchanged trivial MR     DETAILS OF THE OPERATIVE PROCEDURE  The majority of the procedure is documented separately in a procedure note by Dr. Angelena Form.   TRANSFEMORAL ACCESS:   A small incision is made in the right groin immediately over the common femoral artery. The subcutaneous tissues are divided with electrocautery and the anterior surface of the common femoral artery is identified. Sharp dissection is utilized to free up the artery proximally and distally.  A pair of CV-4 Gore-tex sutures are place as diamond-shaped purse-strings on the anterior surface of the femoral artery.  The patient is heparinized systemically and ACT verified > 250 seconds.  The common femoral artery is punctured using an  18 gauge needle and a soft J-tipped guidewire is passed into the common iliac artery under fluoroscopic guidance.  A 6 Fr straight diagnostic catheter is placed over the guidewire and the guidewire is removed.  An Amplatz super stiff guidewire is passed through the sheath into the descending thoracic aorta and the introducing diagnostic catheter is removed.  Serial dilators are passed over the guidewire under continuous fluoroscopic guidance, making certain that each  dilator passes easily all of the way into the distal abdominal aorta.  A 14  Fr Commander introducer sheath is passed over the guidewire into the abdominal aorta.  The introducing dilator is removed, the sheath is flushed with heparinized saline, and the sheath is secured to the skin.    FEMORAL SHEATH REMOVAL AND ARTERIAL CLOSURE:  After the completion of successful valve deployment as documented separately by Dr. Angelena Form, the femoral artery sheath is removed and the arteriotomy is closed using the previously placed Gore-tex purse-string sutures. Once the repair has been completed protamine was administered to reverse the anticoagulation. A digitally-subtracted arteriogram is obtained from just above the aortic bifurcation to below the arteriotomy to confirm the integrity of the vascular repair.  The incision is irrigated with saline solution and subsequently closed in multiple layers using absorbable suture.  The skin incision is closed using a subcuticular skin closure.     Gaye Pollack MD 05/15/2015

## 2015-05-15 NOTE — Progress Notes (Signed)
Timeout at 1125 verified procedure and correct patient.

## 2015-05-15 NOTE — Transfer of Care (Signed)
Immediate Anesthesia Transfer of Care Note  Patient: [name]  Procedure(s) Performed: Procedure(s): TRANSCATHETER AORTIC VALVE REPLACEMENT, TRANSFEMORAL (N/A) TRANSESOPHAGEAL ECHOCARDIOGRAM (TEE) (N/A)  Patient Location: ICU  Anesthesia Type:General  Level of Consciousness: lethargic and responds to stimulation  Airway & Oxygen Therapy: Patient Spontanous Breathing and Patient connected to nasal cannula oxygen  Post-op Assessment: Report given to RN  Post vital signs: Reviewed and stable  Last Vitals:  Filed Vitals:   05/15/15 0948  BP: 154/69  Pulse: 84  Temp: 36.5 C  Resp: 20    Complications: No apparent anesthesia complications

## 2015-05-15 NOTE — Progress Notes (Signed)
Patient dangled on side of bed.  Started dry heaving, vagaled and BP dropped to 50 and patient passed out as he was laid back down.  BP returned to normal and patient noted to be alert.  Will continue to monitor.

## 2015-05-15 NOTE — Progress Notes (Signed)
TCTS BRIEF SICU PROGRESS NOTE  Day of Surgery  S/P Procedure(s) (LRB): TRANSCATHETER AORTIC VALVE REPLACEMENT, TRANSFEMORAL (N/A) TRANSESOPHAGEAL ECHOCARDIOGRAM (TEE) (N/A)   Looks good.  Denies pain.  Some nausea. Vpaced w/ stable hemodynamics O2 sats 100% on 2 L/min UOP adequate Labs okay  Plan: Continue routine early postop  Jordan [name] 05/15/2015 8:01 PM

## 2015-05-15 NOTE — Interval H&P Note (Signed)
History and Physical Interval Note:  05/15/2015 10:15 AM  Jordan [name]  has presented today for surgery, with the diagnosis of SEVERE AS  The various methods of treatment have been discussed with the patient and family. After consideration of risks, benefits and other options for treatment, the patient has consented to  Procedure(s): TRANSCATHETER AORTIC VALVE REPLACEMENT, TRANSFEMORAL (N/A) TRANSESOPHAGEAL ECHOCARDIOGRAM (TEE) (N/A) as a surgical intervention .  The patient's history has been reviewed, patient examined, no change in status, stable for surgery.  I have reviewed the patient's chart and labs.  Questions were answered to the patient's satisfaction.     Gaye Pollack

## 2015-05-16 ENCOUNTER — Inpatient Hospital Stay (HOSPITAL_COMMUNITY): Payer: Medicare Other

## 2015-05-16 ENCOUNTER — Encounter (HOSPITAL_COMMUNITY): Payer: Self-pay | Admitting: Thoracic Surgery (Cardiothoracic Vascular Surgery)

## 2015-05-16 DIAGNOSIS — I350 Nonrheumatic aortic (valve) stenosis: Secondary | ICD-10-CM

## 2015-05-16 DIAGNOSIS — D6489 Other specified anemias: Secondary | ICD-10-CM

## 2015-05-16 DIAGNOSIS — Z954 Presence of other heart-valve replacement: Secondary | ICD-10-CM

## 2015-05-16 LAB — CBC
HCT: 18.1 % — ABNORMAL LOW (ref 39.0–52.0)
HCT: 24.3 % — ABNORMAL LOW (ref 39.0–52.0)
HEMATOCRIT: 18.1 % — AB (ref 39.0–52.0)
Hemoglobin: 5.8 g/dL — CL (ref 13.0–17.0)
Hemoglobin: 5.9 g/dL — CL (ref 13.0–17.0)
Hemoglobin: 8 g/dL — ABNORMAL LOW (ref 13.0–17.0)
MCH: 30.1 pg (ref 26.0–34.0)
MCH: 30.4 pg (ref 26.0–34.0)
MCH: 30.5 pg (ref 26.0–34.0)
MCHC: 32 g/dL (ref 30.0–36.0)
MCHC: 32.6 g/dL (ref 30.0–36.0)
MCHC: 32.9 g/dL (ref 30.0–36.0)
MCV: 93.8 fL (ref 78.0–100.0)
MCV: 93.3 fL (ref 78.0–100.0)
MCV: 92.7 fL (ref 78.0–100.0)
PLATELETS: 88 10*3/uL — AB (ref 150–400)
Platelets: 97 10*3/uL — ABNORMAL LOW (ref 150–400)
Platelets: 99 10*3/uL — ABNORMAL LOW (ref 150–400)
RBC: 1.93 MIL/uL — ABNORMAL LOW (ref 4.22–5.81)
RBC: 1.94 MIL/uL — ABNORMAL LOW (ref 4.22–5.81)
RBC: 2.62 MIL/uL — ABNORMAL LOW (ref 4.22–5.81)
RDW: 12.8 % (ref 11.5–15.5)
RDW: 12.8 % (ref 11.5–15.5)
RDW: 14.1 % (ref 11.5–15.5)
WBC: 9.4 10*3/uL (ref 4.0–10.5)
WBC: 8.9 10*3/uL (ref 4.0–10.5)
WBC: 11.2 10*3/uL — AB (ref 4.0–10.5)

## 2015-05-16 LAB — BASIC METABOLIC PANEL
Anion gap: 6 (ref 5–15)
BUN: 19 mg/dL (ref 6–20)
CO2: 26 mmol/L (ref 22–32)
Calcium: 7.6 mg/dL — ABNORMAL LOW (ref 8.9–10.3)
Chloride: 105 mmol/L (ref 101–111)
Creatinine, Ser: 1.16 mg/dL (ref 0.61–1.24)
GFR calc Af Amer: >60 mL/min (ref >60)
GFR calc non Af Amer: 52 mL/min — ABNORMAL LOW (ref >60)
Glucose, Bld: 114 mg/dL — ABNORMAL HIGH (ref 65–99)
Potassium: 4.1 mmol/L (ref 3.5–5.1)
Sodium: 137 mmol/L (ref 135–145)

## 2015-05-16 LAB — PREPARE RBC (CROSSMATCH)

## 2015-05-16 LAB — MAGNESIUM: Magnesium: 1.5 mg/dL — ABNORMAL LOW (ref 1.7–2.4)

## 2015-05-16 MED ORDER — SODIUM CHLORIDE 0.9 % IJ SOLN
10.0000 mL | INTRAMUSCULAR | Status: DC | PRN
  Administered 2015-05-18: 10 mL
  Filled 2015-05-16: qty 40

## 2015-05-16 MED ORDER — CLOPIDOGREL BISULFATE 75 MG PO TABS
75.0000 mg | ORAL_TABLET | Freq: Every day | ORAL | Status: DC
  Filled 2015-05-16: qty 1

## 2015-05-16 MED ORDER — SODIUM CHLORIDE 0.9 % IV SOLN
Freq: Once | INTRAVENOUS | Status: AC
  Administered 2015-05-16: 06:00:00 via INTRAVENOUS

## 2015-05-16 MED ORDER — SODIUM CHLORIDE 0.9 % IJ SOLN
10.0000 mL | Freq: Two times a day (BID) | INTRAMUSCULAR | Status: DC
  Administered 2015-05-16 – 2015-05-17 (×2): 20 mL
  Administered 2015-05-17 – 2015-05-18 (×2): 10 mL

## 2015-05-16 NOTE — Progress Notes (Signed)
  Echocardiogram 2D Echocardiogram has been performed.  Jordan [name] 05/16/2015, 8:54 AM

## 2015-05-16 NOTE — Progress Notes (Signed)
Anesthesiology Follow-up:  POD #1 S/P TAVR. Awake and alert,  neuro intact, hemodynamically stable overnight, hemoglobin dropped from 9.0 immediately post-op to 5.8 this morning, confirmed by repeat. Abdominal CT negative for evidence of bleeding. Now receiving PRBCs.  VS: T- 38.1 BP- 104/42 RR 18 HR 79 with atrial sensed V pacing. PA 21/8 CO/CI- 7.7/4.4  K- 4.1 glucose -111 BUN/Cr 19/1.16 H/H- 5.9/18.1 platelets-99,000   Extubated in OR, post-op anemia without a source identified, otherwise doing well.  Roberts Gaudy

## 2015-05-16 NOTE — Progress Notes (Signed)
CRITICAL VALUE ALERT  Critical value received:  Hgb. 5.9  Date of notification:  05/16/15  Time of notification:  0515  Critical value read back: yes  Nurse who received alert:  Beckie Salts, RN  MD notified (1st page):  Dr. Roxy Manns  Time of first page:  0515  MD notified (2nd page):  Time of second page:  Responding MD:  Roxy Manns   Time MD responded:  [phone]

## 2015-05-16 NOTE — Progress Notes (Signed)
Patient ID: Jordan [name], male   DOB: 27-Dec-1917, 81 y.o.   MRN: [id number]  SICU Evening Rounds:  Hemodynamically stable  Transfused 2 units today and Hgb up to 8.0  Foley out. Passed a small amount of urine and blood.

## 2015-05-16 NOTE — Progress Notes (Signed)
1 Day Post-Op Procedure(s) (LRB): TRANSCATHETER AORTIC VALVE REPLACEMENT, TRANSFEMORAL (N/A) TRANSESOPHAGEAL ECHOCARDIOGRAM (TEE) (N/A) Subjective:  No complaints  Hgb dropped from 9.0 postop to 5.9 this am. No visible blood loss. I have personally reviewed his CT of abdomen and pelvis which is negative for retroperitoneal hematoma. AAA unchanged.   Had episode of hypotension and near syncope last night when they got him up.  Objective: Vital signs in last 24 hours: Temp:  [95 F (35 C)-100.8 F (38.2 C)] 100.4 F (38 C) (08/31 0800) Pulse Rate:  [60-92] 79 (08/31 0800) Cardiac Rhythm:  [-] Ventricular paced (08/30 2000) Resp:  [10-31] 18 (08/31 0800) BP: (82-154)/(36-69) 104/42 mmHg (08/31 0800) SpO2:  [92 %-100 %] 100 % (08/31 0800) Arterial Line BP: (81-154)/(30-87) 131/37 mmHg (08/31 0800) Weight:  [69.854 kg (154 lb)-71.3 kg (157 lb 3 oz)] 71.3 kg (157 lb 3 oz) (08/31 0530)  Hemodynamic parameters for last 24 hours: PAP: (19-42)/(5-23) 24/8 mmHg CO:  [6.9 L/min-7.7 L/min] 7.7 L/min CI:  [3.9 L/min/m2-4.4 L/min/m2] 4.4 L/min/m2  Intake/Output from previous day: 08/30 0701 - 08/31 0700 In: 3870.2 [I.V.:2611.7; Blood:158.5; IV Piggyback:1100] Out: 1475 [Urine:1375; Blood:100] Intake/Output this shift:    General appearance: alert and cooperative Neurologic: intact Heart: regular rate and rhythm, S1, S2 normal, no murmur, click, rub or gallop Lungs: clear to auscultation bilaterally Abdomen: soft, non-tender; bowel sounds normal; no masses,  no organomegaly Extremities: extremities normal, atraumatic, no cyanosis or edema Wound: left groin incision ok  Lab Results:  Recent Labs  05/16/15 0419 05/16/15 0447  WBC 9.4 8.9  HGB 5.8* 5.9*  HCT 18.1* 18.1*  PLT 97* 99*   BMET:  Recent Labs  05/15/15 1348 05/15/15 1453 05/16/15 0419  NA 138 139 137  K 3.8 4.0 4.1  CL 101  --  105  CO2  --   --  26  GLUCOSE 116* 120* 114*  BUN 17  --  19  CREATININE 1.00   --  1.16  CALCIUM  --   --  7.6*    PT/INR:  Recent Labs  05/15/15 1455  LABPROT 17.7*  INR 1.45   ABG    Component Value Date/Time   PHART 7.355 05/15/2015 1454   HCO3 26.0* 05/15/2015 1454   TCO2 28 05/15/2015 1454   O2SAT 98.0 05/15/2015 1454   CBG (last 3)  No results for input(s): GLUCAP in the last 72 hours.  CXR: clear  CLINICAL DATA: 81 year old male status post endovascular aortic valve replacement with decreasing hematocrit. Query retroperitoneal hemorrhage. Initial encounter.  EXAM: CT ABDOMEN AND PELVIS WITHOUT CONTRAST  TECHNIQUE: Multidetector CT imaging of the abdomen and pelvis was performed following the standard protocol without IV contrast.  COMPARISON: CTA abdomen and pelvis 04/25/2015, and earlier  FINDINGS: No pericardial effusion. Confluent dependent pulmonary opacity bilaterally with trace superimposed layering pleural effusions.  No acute osseous abnormality identified.  No pelvic free fluid. Negative rectum. Thick walled urinary bladder with numerous small diverticula is decompressed around a Foley catheter. There is excreted IV contrast within the bladder.  Mild left inguinal postoperative changes with mild fat stranding and occasional subcutaneous gas. Minimal (1.6 cm) hematoma superficial to the left femoral vasculature on series 21, image 83. No other inguinal hematoma. Negative retroperitoneal space in the pelvis. Negative retroperitoneal space in the abdomen. Large infrarenal abdominal aortic aneurysm is stable measuring up to 4.7 cm diameter (sagittal image 74). Extensive calcified atherosclerosis of the aorta and other major arterial branches.  Redundant sigmoid colon with  mild diverticulosis is mostly decompressed. Negative descending colon, transverse colon and cecum. The cecum is on a lax mesenteric. No dilated small bowel. Negative stomach and duodenum.  Vicarious contrast excretion into the gallbladder.  Trace fluid in Morison's pouch (series 21, image 32) is new. Otherwise negative non contrast liver. Negative noncontrast spleen, pancreas and adrenal glands. Stable kidneys with atrophy and a small non simple exophytic lesion at the left lower pole. No hydronephrosis or hydroureter. No other abdominal free fluid. No free air. No lymphadenopathy.  IMPRESSION: 1. Minimal left inguinal hematoma and postoperative stranding. No hemorrhage identified in the abdomen or pelvis to explain a drop in hematocrit. 2. Stable infrarenal abdominal aortic aneurysm measuring up to 4.7 cm. Recommend followup by abdomen and pelvis CTA in 6 months, and vascular surgery referral/consultation if not already obtained. This recommendation follows ACR consensus guidelines: White Paper of the ACR Incidental Findings Committee II on Vascular Findings. J Am Coll Radiol 2013; 10:789-794. 3. Trace nonspecific free fluid in Morison's pouch. Trace pleural effusions. Bilateral lower lobe atelectasis. 4. Suspicious left renal lower pole lesion re- demonstrated, see impression #4 of the 04/25/2015 CTA.   Electronically Signed  By: Genevie Ann M.D.  On: 05/16/2015 07:10       Assessment/Plan: S/P Procedure(s) (LRB): TRANSCATHETER AORTIC VALVE REPLACEMENT, TRANSFEMORAL (N/A) TRANSESOPHAGEAL ECHOCARDIOGRAM (TEE) (N/A)  He is hemodynamically stable this am and receiving 2 units PRBC's for symptomatic anemia.  Acute blood loss anemia more than expected but he did lose some blood in the OR around the arterial sheath and was positive 2200 cc yesterday so I suspect this is due to blood loss in the OR and dilution in a small elderly man. Transfuse 2 units PRBC's and repeat CBC.  2D echo today  DC swan, A-line and foley.  Mobilize out of bed   LOS: 1 day    Gaye Pollack 05/16/2015

## 2015-05-16 NOTE — Progress Notes (Signed)
     SUBJECTIVE: No chest pain this am. No SOB  BP 104/42 mmHg  Pulse 79  Temp(Src) 100.4 F (38 C) (Oral)  Resp 18  Ht 5\' 4"  (1.626 m)  Wt 157 lb 3 oz (71.3 kg)  BMI 26.97 kg/m2  SpO2 100%  Intake/Output Summary (Last 24 hours) at 05/16/15 0827 Last data filed at 05/16/15 0700  Gross per 24 hour  Intake 3870.17 ml  Output   1475 ml  Net 2395.17 ml    PHYSICAL EXAM General: Well developed, well nourished, in no acute distress. Alert and oriented x 3.  Psych:  Good affect, responds appropriately Neck: No JVD. No masses noted.  Lungs: Clear bilaterally with no wheezes or rhonci noted.  Heart: RRR with no murmurs noted. Abdomen: Bowel sounds are present. Soft, non-tender.  Extremities: No lower extremity edema.   LABS: Basic Metabolic Panel:  Recent Labs  05/15/15 1348 05/15/15 1453 05/16/15 0419  NA 138 139 137  K 3.8 4.0 4.1  CL 101  --  105  CO2  --   --  26  GLUCOSE 116* 120* 114*  BUN 17  --  19  CREATININE 1.00  --  1.16  CALCIUM  --   --  7.6*  MG  --   --  1.5*   CBC:  Recent Labs  05/16/15 0419 05/16/15 0447  WBC 9.4 8.9  HGB 5.8* 5.9*  HCT 18.1* 18.1*  MCV 93.8 93.3  PLT 97* 99*    Current Meds: . acetaminophen  1,000 mg Oral 4 times per day   Or  . acetaminophen (TYLENOL) oral liquid 160 mg/5 mL  1,000 mg Per Tube 4 times per day  . acetaminophen (TYLENOL) oral liquid 160 mg/5 mL  650 mg Per Tube Once   Or  . acetaminophen  650 mg Rectal Once  . antiseptic oral rinse  7 mL Mouth Rinse BID  . cefUROXime (ZINACEF)  IV  1.5 g Intravenous Q12H  . clopidogrel  75 mg Oral Daily  . famotidine (PEPCID) IV  20 mg Intravenous Q12H  . ferrous sulfate  325 mg Oral QPM  . insulin regular  0-10 Units Intravenous TID WC  . pantoprazole  40 mg Oral Daily  . rosuvastatin  5 mg Oral Daily  . tamsulosin  0.4 mg Oral Daily    ASSESSMENT AND PLAN:  1. Severe aortic valve stenosis:  POD # 1 s/p TAVR. Echo today to assess valve. Will hold Plavix  this am until anemia issue is resolved.   2. Post-op anemia: No clear source of bleeding. Likely dilutional with aggressive hydration combined with acute blood loss in the OR during the procedure. He is being transfused this am. Repeat CBC this afternoon following the transfusion.    Jordan [name]  8/31/20168:27 AM

## 2015-05-16 NOTE — Progress Notes (Signed)
Pt stated he was too tired to walk at this time and he wanted to go back to bed from the chair. Pt transferred to bed, CBC was drawn early and will reassess to see if pt is up for walking later this afternoon. Pt VS WNL, NI, no complaints of pain at this time.

## 2015-05-16 NOTE — Progress Notes (Signed)
Attempted to get patient up to walk and encouraged him to get up out of bed. Pt said he did not feel up to it at this time, that he would think about doing it later and he was still tired.

## 2015-05-17 DIAGNOSIS — I350 Nonrheumatic aortic (valve) stenosis: Secondary | ICD-10-CM

## 2015-05-17 LAB — CBC
HCT: 24.8 % — ABNORMAL LOW (ref 39.0–52.0)
HEMATOCRIT: 22.8 % — AB (ref 39.0–52.0)
HEMATOCRIT: 23.7 % — AB (ref 39.0–52.0)
HEMOGLOBIN: 7.7 g/dL — AB (ref 13.0–17.0)
HEMOGLOBIN: 8.2 g/dL — AB (ref 13.0–17.0)
Hemoglobin: 7.4 g/dL — ABNORMAL LOW (ref 13.0–17.0)
MCH: 30.2 pg (ref 26.0–34.0)
MCH: 30.1 pg (ref 26.0–34.0)
MCH: 30.4 pg (ref 26.0–34.0)
MCHC: 32.5 g/dL (ref 30.0–36.0)
MCHC: 32.5 g/dL (ref 30.0–36.0)
MCHC: 33.1 g/dL (ref 30.0–36.0)
MCV: 93.1 fL (ref 78.0–100.0)
MCV: 92.6 fL (ref 78.0–100.0)
MCV: 91.9 fL (ref 78.0–100.0)
Platelets: 74 10*3/uL — ABNORMAL LOW (ref 150–400)
Platelets: 75 10*3/uL — ABNORMAL LOW (ref 150–400)
Platelets: 78 10*3/uL — ABNORMAL LOW (ref 150–400)
RBC: 2.45 MIL/uL — AB (ref 4.22–5.81)
RBC: 2.56 MIL/uL — AB (ref 4.22–5.81)
RBC: 2.7 MIL/uL — AB (ref 4.22–5.81)
RDW: 14.1 % (ref 11.5–15.5)
RDW: 14.2 % (ref 11.5–15.5)
RDW: 13.8 % (ref 11.5–15.5)
WBC: 9.7 10*3/uL (ref 4.0–10.5)
WBC: 10 10*3/uL (ref 4.0–10.5)
WBC: 11.9 10*3/uL — ABNORMAL HIGH (ref 4.0–10.5)

## 2015-05-17 LAB — TYPE AND SCREEN
ABO/RH(D): O NEG
Antibody Screen: NEGATIVE
UNIT DIVISION: 0
Unit division: 0

## 2015-05-17 LAB — BASIC METABOLIC PANEL
Anion gap: 3 — ABNORMAL LOW (ref 5–15)
BUN: 47 mg/dL — AB (ref 6–20)
CHLORIDE: 107 mmol/L (ref 101–111)
CO2: 27 mmol/L (ref 22–32)
Calcium: 7.9 mg/dL — ABNORMAL LOW (ref 8.9–10.3)
Creatinine, Ser: 1.11 mg/dL (ref 0.61–1.24)
GFR calc Af Amer: >60 mL/min (ref >60)
GFR calc non Af Amer: 55 mL/min — ABNORMAL LOW (ref >60)
Glucose, Bld: 105 mg/dL — ABNORMAL HIGH (ref 65–99)
POTASSIUM: 4.1 mmol/L (ref 3.5–5.1)
SODIUM: 137 mmol/L (ref 135–145)

## 2015-05-17 MED ORDER — PANTOPRAZOLE SODIUM 40 MG PO TBEC
40.0000 mg | DELAYED_RELEASE_TABLET | Freq: Two times a day (BID) | ORAL | Status: DC
  Administered 2015-05-17 – 2015-05-18 (×3): 40 mg via ORAL
  Filled 2015-05-17 (×3): qty 1

## 2015-05-17 MED ORDER — FERROUS SULFATE 325 (65 FE) MG PO TABS
325.0000 mg | ORAL_TABLET | Freq: Two times a day (BID) | ORAL | Status: DC
  Administered 2015-05-17 – 2015-05-19 (×5): 325 mg via ORAL
  Filled 2015-05-17 (×7): qty 1

## 2015-05-17 MED FILL — Insulin Regular (Human) Inj 100 Unit/ML: INTRAMUSCULAR | Qty: 2.5 | Status: AC

## 2015-05-17 MED FILL — Potassium Chloride Inj 2 mEq/ML: INTRAVENOUS | Qty: 40 | Status: AC

## 2015-05-17 MED FILL — Magnesium Sulfate Inj 50%: INTRAMUSCULAR | Qty: 10 | Status: AC

## 2015-05-17 MED FILL — Heparin Sodium (Porcine) Inj 1000 Unit/ML: INTRAMUSCULAR | Qty: 30 | Status: AC

## 2015-05-17 NOTE — Progress Notes (Signed)
2 Days Post-Op Procedure(s) (LRB): TRANSCATHETER AORTIC VALVE REPLACEMENT, TRANSFEMORAL (N/A) TRANSESOPHAGEAL ECHOCARDIOGRAM (TEE) (N/A) Subjective:  No complaints. Slept well. Passing urine well without any blood  Objective: Vital signs in last 24 hours: Temp:  [98.3 F (36.8 C)-100.4 F (38 C)] 98.3 F (36.8 C) (09/01 0400) Pulse Rate:  [70-109] 109 (09/01 0700) Cardiac Rhythm:  [-] Ventricular paced (08/31 2000) Resp:  [14-30] 15 (09/01 0700) BP: (91-131)/(36-61) 105/48 mmHg (09/01 0700) SpO2:  [85 %-100 %] 96 % (09/01 0700) Arterial Line BP: (119-152)/(34-53) 152/53 mmHg (08/31 0815) Weight:  [70.4 kg (155 lb 3.3 oz)] 70.4 kg (155 lb 3.3 oz) (09/01 0600)  Hemodynamic parameters for last 24 hours: PAP: (24)/(8) 24/8 mmHg  Intake/Output from previous day: 08/31 0701 - 09/01 0700 In: 9628 [P.O.:500; I.V.:600; Blood:510; IV Piggyback:100] Out: 3662 [HUTML:4650] Intake/Output this shift:    General appearance: alert and cooperative Neurologic: intact Heart: regular rate and rhythm, S1, S2 normal, no murmur, click, rub or gallop Lungs: rales bibasilar Abdomen: soft, non-tender; bowel sounds normal; no masses,  no organomegaly Extremities: edema minimal Wound: left groin incision ok, right groin without hematoma  Lab Results:  Recent Labs  05/17/15 0109 05/17/15 0431  WBC 9.7 10.0  HGB 7.4* 7.7*  HCT 22.8* 23.7*  PLT 74* 75*   BMET:  Recent Labs  05/16/15 0419 05/17/15 0431  NA 137 137  K 4.1 4.1  CL 105 107  CO2 26 27  GLUCOSE 114* 105*  BUN 19 47*  CREATININE 1.16 1.11  CALCIUM 7.6* 7.9*    PT/INR:  Recent Labs  05/15/15 1455  LABPROT 17.7*  INR 1.45   ABG    Component Value Date/Time   PHART 7.355 05/15/2015 1454   HCO3 26.0* 05/15/2015 1454   TCO2 28 05/15/2015 1454   O2SAT 98.0 05/15/2015 1454   CBG (last 3)  No results for input(s): GLUCAP in the last 72 hours.  Assessment/Plan: S/P Procedure(s) (LRB): TRANSCATHETER AORTIC VALVE  REPLACEMENT, TRANSFEMORAL (N/A) TRANSESOPHAGEAL ECHOCARDIOGRAM (TEE) (N/A)  He is hemodynamically stable  Echo yesterday looked good with mean gradient 13 and no paravalvular leak  Acute blood loss anemia: Hgb 7.7 after 2 units transfusion yesterday. He is stable and able to walk around the ICU so will continue iron and follow.  Thrombocytopenia: follow. Will wait to start Plavix with thrombocytopenia and anemia in this 81 year old.   Keep central line in today  Transfer to 2W and continue mobilization.   LOS: 2 days    Gaye Pollack 05/17/2015

## 2015-05-17 NOTE — Progress Notes (Signed)
MD called and made aware of pts large bm containing what looks like old blood. New orders received- will continue to monitor pt. BP stable and pt WNL.

## 2015-05-17 NOTE — Progress Notes (Signed)
[facility] BRIEF SICU PROGRESS NOTE  2 Days Post-Op  S/P Procedure(s) (LRB): TRANSCATHETER AORTIC VALVE REPLACEMENT, TRANSFEMORAL (N/A) TRANSESOPHAGEAL ECHOCARDIOGRAM (TEE) (N/A)   Stable day Paced rhythm w/ stable BP Heme + stool  Plan: Will recheck CBC in am  Rexene Alberts 05/17/2015 7:19 PM

## 2015-05-17 NOTE — Progress Notes (Signed)
    Subjective:  Feels well. No chest pain or dyspnea. Spoke with pt's Rn, [name], who reported pt had BM with old blood noted.   Objective:  Vital Signs in the last 24 hours: Temp:  [98.3 F (36.8 C)-99.7 F (37.6 C)] 99.6 F (37.6 C) (09/01 0803) Pulse Rate:  [70-109] 90 (09/01 0900) Resp:  [14-30] 16 (09/01 0900) BP: (91-131)/(36-61) 108/46 mmHg (09/01 0900) SpO2:  [85 %-100 %] 93 % (09/01 0900) Weight:  [155 lb 3.3 oz (70.4 kg)] 155 lb 3.3 oz (70.4 kg) (09/01 0600)  Intake/Output from previous day: 08/31 0701 - 09/01 0700 In: 1710 [P.O.:500; I.V.:600; Blood:510; IV Piggyback:100] Out: 7106 [YIRSW:5462]  Physical Exam: Pt is alert and oriented, elderly male in NAD HEENT: normal Neck: JVP - normal Lungs: CTA bilaterally CV: RRR with 2/6 SEM at RUSB Abd: soft, NT, Positive BS, no hepatomegaly Ext: no C/C/E, distal pulses intact and equal, groin site clear Skin: warm/dry no rash   Lab Results:  Recent Labs  05/17/15 0109 05/17/15 0431  WBC 9.7 10.0  HGB 7.4* 7.7*  PLT 74* 75*    Recent Labs  05/16/15 0419 05/17/15 0431  NA 137 137  K 4.1 4.1  CL 105 107  CO2 26 27  GLUCOSE 114* 105*  BUN 19 47*  CREATININE 1.16 1.11   No results for input(s): TROPONINI in the last 72 hours.  Invalid input(s): CK, MB  Cardiac Studies: 2D Echo: Study Conclusions  - Left ventricle: The cavity size was normal. Wall thickness was increased in a pattern of mild LVH. Systolic function was vigorous. The estimated ejection fraction was in the range of 65% to 70%. Wall motion was normal; there were no regional wall motion abnormalities. - Aortic valve: Valve area (VTI): 2.1 cm^2. Valve area (Vmax): 2.09 cm^2. Valve area (Vmean): 1.91 cm^2. - Mitral valve: Valve area by continuity equation (using LVOT flow): 1.77 cm^2. - Left atrium: The atrium was mildly to moderately dilated.  Tele: V-pacing, episodes of sinus rhythm with heart rate approximately 100  bpm  Assessment/Plan:  1. Severe aortic stenosis POD #2 from TAVR - pt stable with plans for tx to tele today 2. Post-op blood loss anemia - felt to be combination of surgical blood loss, dilution, possible GI blood loss  Post-op echo reviewed and looks good. Agree with holding plavix and following H/H will check again this pm. Protonix BID. Mobilize.  Sherren Mocha, M.D. 05/17/2015, 9:53 AM

## 2015-05-18 LAB — CBC
HEMATOCRIT: 24 % — AB (ref 39.0–52.0)
Hemoglobin: 7.9 g/dL — ABNORMAL LOW (ref 13.0–17.0)
MCH: 30.5 pg (ref 26.0–34.0)
MCHC: 32.9 g/dL (ref 30.0–36.0)
MCV: 92.7 fL (ref 78.0–100.0)
PLATELETS: 80 10*3/uL — AB (ref 150–400)
RBC: 2.59 MIL/uL — ABNORMAL LOW (ref 4.22–5.81)
RDW: 13.9 % (ref 11.5–15.5)
WBC: 10.5 10*3/uL (ref 4.0–10.5)

## 2015-05-18 MED ORDER — DOCUSATE SODIUM 100 MG PO CAPS
200.0000 mg | ORAL_CAPSULE | Freq: Every day | ORAL | Status: DC
  Filled 2015-05-18: qty 2

## 2015-05-18 MED ORDER — ACETAMINOPHEN 325 MG PO TABS: 650.0000 mg | ORAL_TABLET | Freq: Four times a day (QID) | ORAL | Status: DC | PRN

## 2015-05-18 MED ORDER — ONDANSETRON HCL 4 MG PO TABS: 4.0000 mg | ORAL_TABLET | Freq: Four times a day (QID) | ORAL | Status: DC | PRN

## 2015-05-18 MED ORDER — BISACODYL 5 MG PO TBEC: 10.0000 mg | DELAYED_RELEASE_TABLET | Freq: Every day | ORAL | Status: DC | PRN

## 2015-05-18 MED ORDER — SODIUM CHLORIDE 0.9 % IJ SOLN: 3.0000 mL | INTRAMUSCULAR | Status: DC | PRN

## 2015-05-18 MED ORDER — BISACODYL 10 MG RE SUPP: 10.0000 mg | Freq: Every day | RECTAL | Status: DC | PRN

## 2015-05-18 MED ORDER — SODIUM CHLORIDE 0.9 % IV SOLN: 250.0000 mL | INTRAVENOUS | Status: DC | PRN

## 2015-05-18 MED ORDER — MOVING RIGHT ALONG BOOK
Freq: Once | Status: AC
  Administered 2015-05-18: 08:00:00
  Filled 2015-05-18: qty 1

## 2015-05-18 MED ORDER — SODIUM CHLORIDE 0.9 % IJ SOLN
3.0000 mL | Freq: Two times a day (BID) | INTRAMUSCULAR | Status: DC
  Administered 2015-05-18: 10 mL via INTRAVENOUS
  Administered 2015-05-18 – 2015-05-19 (×2): 3 mL via INTRAVENOUS

## 2015-05-18 MED ORDER — ONDANSETRON HCL 4 MG/2ML IJ SOLN: 4.0000 mg | Freq: Four times a day (QID) | INTRAMUSCULAR | Status: DC | PRN

## 2015-05-18 NOTE — Progress Notes (Signed)
1402 Offered to walk with pt but tired. Encouraged to walk with staff later. Will follow up tomorrow. Graylon Good RN BSN 05/18/2015 2:03 PM

## 2015-05-18 NOTE — Progress Notes (Signed)
Pt ambulated 150 ft with RN. Pt tolerated it well. VS were taken after ambulating and were stable. Pt stated he did not feel dizzy or weak when walking in the hall.   Angus Seller

## 2015-05-18 NOTE — Progress Notes (Signed)
Utilization review completed.  

## 2015-05-18 NOTE — Progress Notes (Signed)
    Subjective:  Patient feeling well this morning. Just returned from walking. No chest pain or shortness of breath.  Objective:  Vital Signs in the last 24 hours: Temp:  [97.6 F (36.4 C)-99.6 F (37.6 C)] 99 F (37.2 C) (09/01 1937) Pulse Rate:  [70-112] 86 (09/02 0700) Resp:  [13-28] 13 (09/02 0700) BP: (95-136)/(41-62) 122/61 mmHg (09/02 0700) SpO2:  [91 %-99 %] 98 % (09/02 0700) Weight:  [154 lb 5.2 oz (70 kg)] 154 lb 5.2 oz (70 kg) (09/02 0500)  Intake/Output from previous day: 09/01 0701 - 09/02 0700 In: 410 [P.O.:360; IV Piggyback:50] Out: 1638 [Urine:1550]  Physical Exam: Pt is alert and oriented, pleasant elderly male in NAD HEENT: normal Neck: JVP - normal Lungs: CTA bilaterally CV: RRR with grade 2/6 systolic ejection murmur at the right upper sternal border Abd: soft, NT Ext: no C/C/E, distal pulses intact and equal Skin: warm/dry no rash   Lab Results:  Recent Labs  05/17/15 1511 05/18/15 0354  WBC 11.9* 10.5  HGB 8.2* 7.9*  PLT 78* 80*    Recent Labs  05/16/15 0419 05/17/15 0431  NA 137 137  K 4.1 4.1  CL 105 107  CO2 26 27  GLUCOSE 114* 105*  BUN 19 47*  CREATININE 1.16 1.11   No results for input(s): TROPONINI in the last 72 hours.  Invalid input(s): CK, MB  Tele: Atrial sensed ventricular pacing  Assessment/Plan:  1. Severe aortic stenosis postoperative day #3 from TAVR 2. Postoperative blood loss anemia 3. Postoperative thrombocytopenia, stable  Hemoglobin is stable. No further signs of bleeding. The patient appears to be progressing well. His platelet count is stable and there are no signs of active bleeding. Probably okay to start Plavix 75 mg daily, but will review with Dr. Cyndia Bent before starting. The patient is allergic (intolerant) to aspirin. Anticipate discharge home tomorrow.  Sherren Mocha, M.D. 05/18/2015, 8:00 AM

## 2015-05-19 ENCOUNTER — Inpatient Hospital Stay (HOSPITAL_COMMUNITY): Payer: Medicare Other

## 2015-05-19 DIAGNOSIS — R609 Edema, unspecified: Secondary | ICD-10-CM

## 2015-05-19 MED ORDER — CLOPIDOGREL BISULFATE 75 MG PO TABS: 75.0000 mg | ORAL_TABLET | Freq: Every day | ORAL | Status: DC

## 2015-05-19 NOTE — Discharge Summary (Signed)
Patient ID: Jordan [name],  MRN: [id number], DOB/AGE: 1917-11-22 81 y.o.  Admit date: 05/15/2015 Discharge date: 05/19/2015  Primary Care Provider: Donnajean Lopes, MD Primary Cardiologist: Dr Donneta Romberg Dr Lovena Le  Discharge Diagnoses Principal Problem:   S/P TAVR (transcatheter aortic valve replacement) Active Problems:   Coronary atherosclerosis   Aortic stenosis   Severe aortic valve stenosis    Procedures: TAVR 05/15/15   Hospital Course: 81 year old gentleman with hyperlipidemia, HTN, CAD, symptomatic bradycardia s/p PPM in 2004 with gen change in 2012, and severe aortic stenosis. He has had severe AS for many years but was not considered a candidate for surgery due to his advanced age. He was admitted on 03/09/2015 with chest pain and ruled out for MI. An echo showed a mean AV gradient of 42 and an AVA of 0.66 cm2. LV function was normal. He has a history of CAD with remote stent placement in 1999 and cath in January 2013 showed mild to moderate coronary disease with severe ostial diagonal stenosis not felt to be amenable to PCI.         A cardiac cath on 04/11/2015 showed mild to moderate non-obstructive CAD with severe AS and a mean gradient of 41 mm Hg. He has a 4.3 cm abdominal aortic aneurysm followed by Dr. Kellie Simmering. A CT in October 2015 was unchanged. He was seen by pulmonary previously for pulmonary nodule but this was felt to be unchanged from 5 years ago. He is followed by Dr. Karsten Ro for a renal tumor that has been stable.          He has been active until about 2 months ago when he started having chest pressure and shortness of breath with minimal exertion. He has been very tired and can't play golf any longer. After evaluation by Dr Cyndia Bent and Dr Julianne Handler it was felt the pt was a good candidate for TAVR. He was admitted 05/15/15 and underwent TAVR. He tolerated this well. He did have post op blood loss anemia and thrombocytopenia. He was transfused 2 units PRBCs. There  was no sign of active bleeding.He is intolerant to ASA. His Plavix was held but it was felt to be safe to resume this at discharge. He did have some swelling in his Rt upper arm and a venous duplex to r/o DVT was done before discharge and results are pending at this time. His Norvasc was held post op, this may need to be resumed for HTN post op. F/U with Dr Roxy Manns and Dr Burt Knack will be arranged. . Hgb is 7.9 and Plts 80k at discharge.   Discharge Vitals:  Blood pressure 126/64, pulse 74, temperature 98 F (36.7 C), temperature source Oral, resp. rate 16, height 5\' 4"  (1.626 m), weight 153 lb 14.1 oz (69.8 kg), SpO2 97 %.    Labs: No results found for this or any previous visit (from the past 24 hour(s)).  Disposition:  Follow-up Information    Follow up with Rexene Alberts, MD.   Specialty:  Cardiothoracic Surgery   Why:  Office will contact you   Contact information:   [address] [place] [address] [phone]       Follow up with Sherren Mocha, MD.   Specialty:  Cardiology   Why:  Office will contact you   Contact information:   [address] [place] [address] [phone]       Discharge Medications:    Medication List  STOP taking these medications        amLODipine 5 MG tablet  Commonly known as:  NORVASC      TAKE these medications        acetaminophen 325 MG tablet  Commonly known as:  TYLENOL  Take 650 mg by mouth every 6 (six) hours as needed for mild pain, moderate pain or fever.     clopidogrel 75 MG tablet  Commonly known as:  PLAVIX  Take 75 mg by mouth daily.     esomeprazole 40 MG capsule  Commonly known as:  NEXIUM  Take 40 mg by mouth daily.     ferrous sulfate 325 (65 FE) MG tablet  Take 325 mg by mouth every evening.     rosuvastatin 5 MG tablet  Commonly known as:  CRESTOR  Take 5 mg by mouth daily.     silodosin 4 MG Caps capsule  Commonly known as:  RAPAFLO  Take 4 mg by mouth every  evening.         Duration of Discharge Encounter: Greater than 30 minutes including physician time.  Angelena Form PA-C 05/19/2015 11:47 AM

## 2015-05-19 NOTE — Progress Notes (Signed)
      [address] [place]       [employer] [address]             [phone]      4 Days Post-Op Procedure(s) (LRB): TRANSCATHETER AORTIC VALVE REPLACEMENT, TRANSFEMORAL (N/A) TRANSESOPHAGEAL ECHOCARDIOGRAM (TEE) (N/A) Subjective: Feels well, hopes to go home soon  Objective: Vital signs in last 24 hours: Temp:  [98 F (36.7 C)-98.5 F (36.9 C)] 98 F (36.7 C) (09/03 0514) Pulse Rate:  [74-109] 74 (09/03 0514) Cardiac Rhythm:  [-] Ventricular paced (09/03 0757) Resp:  [12-20] 16 (09/03 0514) BP: (104-139)/(48-64) 126/64 mmHg (09/03 0514) SpO2:  [96 %-99 %] 97 % (09/03 0514) Weight:  [153 lb 14.1 oz (69.8 kg)] 153 lb 14.1 oz (69.8 kg) (09/03 0514)  Hemodynamic parameters for last 24 hours:    Intake/Output from previous day: 09/02 0701 - 09/03 0700 In: 130 [P.O.:120; I.V.:10] Out: 500 [Urine:500] Intake/Output this shift: Total I/O In: 240 [P.O.:240] Out: -   General appearance: alert, cooperative and no distress Heart: regular rate and rhythm and soft systolic murmur Lungs: clear to auscultation bilaterally Abdomen: benign Extremities: some swelling right arm/hand Wound: incis healing well, no hematoma or drainage  Lab Results:  Recent Labs  05/17/15 1511 05/18/15 0354  WBC 11.9* 10.5  HGB 8.2* 7.9*  HCT 24.8* 24.0*  PLT 78* 80*   BMET:  Recent Labs  05/17/15 0431  NA 137  K 4.1  CL 107  CO2 27  GLUCOSE 105*  BUN 47*  CREATININE 1.11  CALCIUM 7.9*    PT/INR: No results for input(s): LABPROT, INR in the last 72 hours. ABG    Component Value Date/Time   PHART 7.355 05/15/2015 1454   HCO3 26.0* 05/15/2015 1454   TCO2 28 05/15/2015 1454   O2SAT 98.0 05/15/2015 1454   CBG (last 3)  No results for input(s): GLUCAP in the last 72 hours.  Meds Scheduled Meds: . acetaminophen  1,000 mg Oral 4 times per day   Or  . acetaminophen (TYLENOL) oral liquid 160 mg/5 mL  1,000 mg Per Tube 4 times per day  . antiseptic oral rinse  7 mL Mouth  Rinse BID  . docusate sodium  200 mg Oral Daily  . ferrous sulfate  325 mg Oral BID WC  . pantoprazole  40 mg Oral BID  . rosuvastatin  5 mg Oral Daily  . sodium chloride  10-40 mL Intracatheter Q12H  . sodium chloride  3 mL Intravenous Q12H  . tamsulosin  0.4 mg Oral Daily   Continuous Infusions:  PRN Meds:.sodium chloride, acetaminophen, bisacodyl **OR** bisacodyl, lactated ringers, ondansetron **OR** ondansetron (ZOFRAN) IV, oxyCODONE, sodium chloride, sodium chloride, traMADol  Xrays No results found.  Assessment/Plan: S/P Procedure(s) (LRB): TRANSCATHETER AORTIC VALVE REPLACEMENT, TRANSFEMORAL (N/A) TRANSESOPHAGEAL ECHOCARDIOGRAM (TEE) (N/A)  1 doing well overall 2 some right arm/hand swelling, could consider duplex to eval for DVT 3 H/H is stable as is platelet count- may be able to restart plavix   LOS: 4 days    Jordan [name] E 05/19/2015

## 2015-05-19 NOTE — Progress Notes (Signed)
Discharged to home with family office visits in place teaching done  

## 2015-05-19 NOTE — Progress Notes (Signed)
VASCULAR LAB PRELIMINARY  PRELIMINARY  PRELIMINARY  PRELIMINARY  Right upper extremity venous duplex completed.    Preliminary report:  There is no DVT or SVT noted in the right upper extremity.  There is interstitial fluid noted throughout extremity.   Rosenda Geffrard, RVT 05/19/2015, 12:21 PM

## 2015-05-19 NOTE — Progress Notes (Signed)
   Subjective: No CP  NO SOB   Objective: Filed Vitals:   05/18/15 1455 05/18/15 1956 05/18/15 2049 05/19/15 0514  BP: 104/56 128/57 127/48 126/64  Pulse: 89 96 109 74  Temp:  98.5 F (36.9 C)  98 F (36.7 C)  TempSrc:  Oral  Oral  Resp: 20 18 12 16   Height:      Weight:    153 lb 14.1 oz (69.8 kg)  SpO2: 98% 96% 96% 97%   Weight change: -7.1 oz (-0.2 kg)  Intake/Output Summary (Last 24 hours) at 05/19/15 0931 Last data filed at 05/19/15 0757  Gross per 24 hour  Intake    370 ml  Output    500 ml  Net   -130 ml    General: Alert, awake, oriented x3, in no acute distress Neck:  JVP is normal Heart: Regular rate and rhythm, without murmurs, rubs, gallops.  Lungs: Clear to auscultation.  No rales or wheezes. Exemities:  No edema.   Neuro: Grossly intact, nonfocal.  Tele  SR    Lab Results: No results found for this or any previous visit (from the past 24 hour(s)).  Studies/Results: No results found.  Medications: REviewed   @PROBHOSP @  1  Aortic stenosis/ s/p TAVR  REviewed with Ezzie Dural  Will start plavix 75  No loading dose.   2  Anemia  H/H are stable  Follow closely  On Fe supplementation    OK to dc today with close f/u    LOS: 4 days   Dorris Carnes 05/19/2015, 9:31 AM

## 2015-05-19 NOTE — Discharge Instructions (Signed)
Friendly  After your TAVR procedure, you will need help when you go home. It is hard to predict how much help you will need and for how long so it is best to plan ahead so you are sure to have the assistance you need. Most people who undergo TAVR say it takes one to three months to fully recover.  If you live alone, arrange for someone to stay with you or stay with family or friends for at least the first week to assist in your recovery.  If you are from outside the [facility] area, its best that you stay one night close to the hospital before your trip home.  Your discharge medications will be reviewed with you by your nurse prior to discharge. If you were given new prescriptions, they may be filled through our discharge medication service or may be sent to the pharmacy of your preference at the time of discharge. Pick them up prior to returning home.  Your primary provider of health care should review your medications during your first follow-up appointment so bring your hospital discharge papers with you.  There are some medications that help prevent complications after your heart valve procedure. For example: clopidogrel (Plavix) helps prevent blood clots from attaching to your new heart valve. SITE CARE  You likely will have small openings in both groins from catheters used during the procedure. If you had a transfemoral procedure, one groin will have a larger opening and may be bruised or tender.  If you had a chest procedure, you will have either a small incision in your upper sternum (breast-bone) or between your ribs on your left side. - Chest wall site: The surgical incision should be kept dry (no lotions / oils / powders) and open to air. If you experience irritation from clothing rubbing on the incision, a light gauze dressing may be applied. - Inspect your incision daily; notify your physician if there is increased redness, swelling or drainage from the  incision. - If the incision is located on your breast-bone you must avoid lifting objects heavier than a gallon of milk (eight pounds) and stretching / twisting / pulling with your arms for at least three months to ensure strong bone healing. - Notify your physician if you feel a popping of the breast-bone with movement.  Check your sites daily. Contact our office if you have any of the following problems: - Redness and warmth that does not go away - Yellow or green drainage from the wound - Fever and chills - Increasing numbness in your legs - Worsening pain at the site  If you had a leg/groin procedure, it is normal to have bruising or a soft lump at the site. It is not normal if the lump suddenly becomes larger or more firm. This may mean you are bleeding. If this happens: - Lie down - Have someone press down hard, just above the hole in your skin where the procedure was performed for 15 minutes. If after holding on the site, the lump does not become larger or harder, they are performing this correctly. - If the bleeding has stopped after 15 minutes, rest and stay laying down for at least two hours. - If the bleeding continues, call 911 for an ambulance. Do NOT drive yourself or have someone else drive you. DRESSING  Groin site: you may leave the clear dressing over the site for up to one week or until it falls off.  HYGIENE  If you had a  femoral (leg) procedure, you may take a shower when you return home. After the shower, pat the site dry. Do NOT use powder, oils or lotions in your groin area until the site has completely healed.  If you had a chest procedure, you may shower when you return home unless specifically instructed not to by your discharging practitioner. - DO NOT scrub incision; pat dry with a towel - DO NOT apply any lotions, oils, powders to the incision - No tub baths / swimming for at least six weeks. FOLLOW-UP APPOINTMENTS  With your primary  care provider within one to two weeks following discharge.  With your Cardiologist in one month and one year. An appointment will be made for you prior to discharge.   At your follow-up appointments, in addition to your clinic visit you will have an Echocardiogram, blood work and functional assessment.  If you have dental work or any other medical procedures: - Show them your wallet card. - Tell them you have a prosthetic heart valve. - Dental work performed after receiving a new heart valve may result in the valve becoming infected. Tell your dentist that you need antibiotic therapy for dental procedures. NUTRITION AND FLUIDS  Healthy eating is important for your healing and recovery. During cardiac rehabilitation you will attend classes regarding heart-healthy eating.  Some people find they have a poor appetite for two to four weeks after TAVR. Try to keep your food choices healthy while your appetite returns.  If you limited the amount of fluid you could drink before your procedure, discuss with your heart doctor if you need to continue fluid limitation after the procedure.  REPORTING YOUR HISTORY  In the future when you need health care always report that you have had a heart valve implantation. You will receive a heart valve identification card either at the time of your procedure or in the mail after you return home.  We recommend you keep the card in your wallet at all times and make a copy for your files in the event of loss.  If you go to the Emergency Department or are admitted to the hospital in the first month after your procedure. Ask the providers to call the [facility]. AT HOME WALKING Step 1 Get dressed in the morning. Take care of your personal needs (personal washing, making meals). Keep activities easy for short amounts of time and with rest periods. Walk around the house. Take stairs slowly, with rests. Step 2 Slowly  return to activities around the house that dont involve a long time standing or using your arms (this causes more strain on your body). Walks should feel light and easy. Walk for five to 10 minutes at a time once or twice a day (such as a morning and afternoon walk). Stay close to home; avoid hills. Step 3 Do a few more activities around the house (making your bed, simple meals or watering plants). These walks should feel easy. Continue to walk once or twice a day. Over several days, lengthen your walks. For example, add five minutes every day or two. Step 4 Slowly start returning to your usual activities again (like shopping, light gardening, going out with friends). When a 15-minute walk feels easy, you may increase your walking speed to a level that feels moderate. Begin cardiac rehabilitation. Continue to lengthen your walks until you are walking a total of 30 or more minutes five to six days per week. ACTIVITY PROGRESSION AT HOME  Follow  these guidelines until you begin cardiac rehabilitation.  Move through Step 1 to 4 at your own pace. Take two to seven days to complete each step.  Pay attention to how you feel whenever you increase your activity or add new activities.  If you have any symptoms (unusual tiredness, shortness of breath, chest pain or dizziness), stop the activity and go back to the step where you had no symptoms. ACTIVITY AND EXERCISE  Daily activity and exercise are an important part of your recovery. People recover at different rates depending on their general health and type of valve procedure.  Most people require six to 10 weeks to feel recovered.  No lifting, pushing, pulling more than 10 pounds (examples to avoid: groceries, vacuuming, gardening, golfing): - For one week with a procedure through the groin. - For six weeks for procedures through the chest wall. - For three months for procedures through the breast-bone.  After  the initial healing process of the access site, we recommend cardiac rehabilitation for all TAVR patients. Cardiac rehabilitation will help you: - Rebuild stamina, strength and balance. - Learn how to participate in activities safely, as well as help you regain confidence to do so. - Return to activities of daily living and leisure.

## 2015-05-19 NOTE — Progress Notes (Signed)
[id number] Cardiac Rehab Completed discharge education with pt, son and daughter in law. They voice understanding. Pt agrees to [facility]. [facility] in [place], will send referral. Discussed safety with them due to weakness and his low blood count. I gave them information on heart health and high iron diet. Deon Pilling, RN 05/19/2015 11:21 AM

## 2015-05-30 ENCOUNTER — Ambulatory Visit (INDEPENDENT_AMBULATORY_CARE_PROVIDER_SITE_OTHER): Payer: Medicare Other | Admitting: Surgery

## 2015-05-30 ENCOUNTER — Encounter: Payer: Self-pay | Admitting: Surgery

## 2015-05-30 VITALS — BP 129/67 | HR 84 | Resp 16 | Ht 64.0 in | Wt 154.5 lb

## 2015-05-30 DIAGNOSIS — Z952 Presence of prosthetic heart valve: Secondary | ICD-10-CM

## 2015-05-30 DIAGNOSIS — I350 Nonrheumatic aortic (valve) stenosis: Secondary | ICD-10-CM | POA: Diagnosis not present

## 2015-05-30 DIAGNOSIS — Z954 Presence of other heart-valve replacement: Secondary | ICD-10-CM | POA: Diagnosis not present

## 2015-05-30 NOTE — Progress Notes (Signed)
      HPI: Patient returns for routine postoperative follow-up having undergone Left transfemoral TAVR on 05/15/2015. The patient's early postoperative recovery while in the hospital was notable for development of significant anemia the morning after surgery. His Hgb was 11.2 preop and 9.0 immediately postop but dropped to 5.8 the following morning. CT of the abdomen and pelvis was negative for bleeding. He does have a 4.5 cm AAA that has been stable. He had no visible GI blood loss but did have more than usual blood loss from the femoral cannulation site while the sheath was in place due to stiffness of the vessel an lack of a good seal around the sheath. He was transfused. Since hospital discharge the patient reports that he has been doing well. He is now back living independently and walking several times per day. He is still fatigued at the end of his walks but improving. His last Hgb was 7.9 prior to discharge.   Current Outpatient Prescriptions  Medication Sig Dispense Refill  . acetaminophen (TYLENOL) 325 MG tablet Take 650 mg by mouth every 6 (six) hours as needed for mild pain, moderate pain or fever.     Marland Kitchen amLODipine (NORVASC) 5 MG tablet Take 5 mg by mouth daily.    . clopidogrel (PLAVIX) 75 MG tablet Take 75 mg by mouth daily.      Marland Kitchen esomeprazole (NEXIUM) 40 MG capsule Take 40 mg by mouth daily.    . ferrous sulfate 325 (65 FE) MG tablet Take 325 mg by mouth every evening.     . rosuvastatin (CRESTOR) 5 MG tablet Take 5 mg by mouth daily.    . silodosin (RAPAFLO) 4 MG CAPS capsule Take 4 mg by mouth every evening.      No current facility-administered medications for this visit.    Physical Exam: BP 129/67 mmHg  Pulse 84  Resp 16  Ht 5\' 4"  (1.626 m)  Wt 154 lb 8 oz (70.081 kg)  BMI 26.51 kg/m2  SpO2 98% He looks well Lungs are clear Cardiac exam shows a regular rate and rhythm with normal valve sounds. There is a faint systolic flow murmur but no diastolic murmur. The left  groin incision is healing well. There is no peripheral edema.  Diagnostic Tests:  None today.  Impression:  Overall I think he is doing well. I encouraged him to continue walking. He is planning to participate in cardiac rehab. I told him he could drive his car but should not lift anything heavier than 10 lbs for one month postop.   Plan:  He has a follow up appt with Dr. Angelena Form in two weeks and will have an echo at that time. He should probably have a follow up Hgb too. Long term follow up will be with Dr. Peter Martinique. He has an appt in November to see Dr. Kellie Simmering for follow up of his AAA.   Gaye Pollack, MD [facility] [phone]

## 2015-06-19 ENCOUNTER — Encounter: Payer: Self-pay | Admitting: *Deleted

## 2015-06-20 ENCOUNTER — Telehealth: Payer: Self-pay | Admitting: Cardiology

## 2015-06-20 NOTE — Telephone Encounter (Signed)
Do not need this encounter °

## 2015-06-21 ENCOUNTER — Other Ambulatory Visit: Payer: Self-pay | Admitting: Cardiovascular Disease

## 2015-06-21 DIAGNOSIS — I350 Nonrheumatic aortic (valve) stenosis: Secondary | ICD-10-CM

## 2015-06-22 ENCOUNTER — Other Ambulatory Visit: Payer: Self-pay

## 2015-06-22 ENCOUNTER — Ambulatory Visit (HOSPITAL_COMMUNITY): Payer: Medicare Other | Attending: Cardiovascular Disease

## 2015-06-22 ENCOUNTER — Encounter: Payer: Self-pay | Admitting: Cardiovascular Disease

## 2015-06-22 ENCOUNTER — Ambulatory Visit (INDEPENDENT_AMBULATORY_CARE_PROVIDER_SITE_OTHER): Payer: Medicare Other | Admitting: Cardiovascular Disease

## 2015-06-22 VITALS — BP 138/70 | HR 75 | Ht 64.0 in | Wt 156.0 lb

## 2015-06-22 DIAGNOSIS — I517 Cardiomegaly: Secondary | ICD-10-CM | POA: Diagnosis not present

## 2015-06-22 DIAGNOSIS — D508 Other iron deficiency anemias: Secondary | ICD-10-CM

## 2015-06-22 DIAGNOSIS — I059 Rheumatic mitral valve disease, unspecified: Secondary | ICD-10-CM | POA: Insufficient documentation

## 2015-06-22 DIAGNOSIS — Z954 Presence of other heart-valve replacement: Secondary | ICD-10-CM

## 2015-06-22 DIAGNOSIS — Z953 Presence of xenogenic heart valve: Secondary | ICD-10-CM | POA: Diagnosis not present

## 2015-06-22 DIAGNOSIS — Z952 Presence of prosthetic heart valve: Secondary | ICD-10-CM

## 2015-06-22 DIAGNOSIS — I10 Essential (primary) hypertension: Secondary | ICD-10-CM | POA: Insufficient documentation

## 2015-06-22 DIAGNOSIS — E785 Hyperlipidemia, unspecified: Secondary | ICD-10-CM | POA: Insufficient documentation

## 2015-06-22 DIAGNOSIS — I350 Nonrheumatic aortic (valve) stenosis: Secondary | ICD-10-CM

## 2015-06-22 LAB — CBC WITH DIFFERENTIAL/PLATELET
BASOS PCT: 0 % (ref 0–1)
Basophils Absolute: 0 10*3/uL (ref 0.0–0.1)
EOS ABS: 0.3 10*3/uL (ref 0.0–0.7)
Eosinophils Relative: 3 % (ref 0–5)
HCT: 32 % — ABNORMAL LOW (ref 39.0–52.0)
Hemoglobin: 10.8 g/dL — ABNORMAL LOW (ref 13.0–17.0)
Lymphocytes Relative: 41 % (ref 12–46)
Lymphs Abs: 3.6 10*3/uL (ref 0.7–4.0)
MCH: 30.6 pg (ref 26.0–34.0)
MCHC: 33.8 g/dL (ref 30.0–36.0)
MCV: 90.7 fL (ref 78.0–100.0)
MONO ABS: 0.9 10*3/uL (ref 0.1–1.0)
MONOS PCT: 10 % (ref 3–12)
MPV: 9.4 fL (ref 8.6–12.4)
NEUTROS PCT: 46 % (ref 43–77)
Neutro Abs: 4 10*3/uL (ref 1.7–7.7)
PLATELETS: 179 10*3/uL (ref 150–400)
RBC: 3.53 MIL/uL — ABNORMAL LOW (ref 4.22–5.81)
RDW: 13.7 % (ref 11.5–15.5)
WBC: 8.7 10*3/uL (ref 4.0–10.5)

## 2015-06-22 NOTE — Patient Instructions (Signed)
Medication Instructions:  Your physician recommends that you continue on your current medications as directed. Please refer to the Current Medication list given to you today.   Labwork: Lab work to be done today--CBC  Testing/Procedures: none  Follow-Up: Your physician recommends that you schedule a follow-up appointment in: 3 months with Dr. Martinique  Your physician wants you to follow-up in: 12 months with Dr. Shirley Friar will receive a reminder letter in the mail two months in advance. If you don't receive a letter, please call our office to schedule the follow-up appointment.  Echocardiogram to be done day of this appointment    Any Other Special Instructions Will Be Listed Below (If Applicable).

## 2015-06-22 NOTE — Progress Notes (Signed)
Chief Complaint  Patient presents with  . Follow-up     History of Present Illness: 81 yo male with history of CAD, HLD, anemia, symptomatic bradycardia now s/p PPM placement, HTN, BPH, GERD and severe aortic valve stenosis now s/p TAVR who is here today in the valve clinic for TAVR follow up. His cardiac issues are followed by Dr. Martinique and his pacemaker is followed by DR. Lovena Le. He was evaluated in the valve clinic this summer for severe AS and underwent TAVR on 05/15/15 with a 26 mm Edwards Sapien 3 bioprosthetic valve delivered from the left transfemoral approach. He did have profound post-op anemia felt to be due to excessive leaking of blood around the femoral sheath during the case. He was transfused and did well. No other source of bleeding.    He is here today for follow up.  He is feeling well. No chest pain. Mild SOB with heavy exertion. No LE edema.   Primary Care Physician: Leanna Battles   Past Medical History  Diagnosis Date  . CAD (coronary artery disease)     Remote stent to LAD in 1999. Last cath in 2004 and he is managed medically  . Anemia   . Hyperlipidemia   . Aortic stenosis   . BPH (benign prostatic hyperplasia)   . HTN (hypertension)   . Colon polyps   . Blood transfusion   . GERD (gastroesophageal reflux disease)   . Hemorrhoid   . Complication of anesthesia   . Dysrhythmia     paced  . Arthritis 09/18/11    "in my knees"  . Basal cell carcinoma of skin   . AAA (abdominal aortic aneurysm) ([place])   . Lung nodule     RLL  . Renal mass, left   . Pacemaker     due to bradycardia; placed in 2004  . 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    26 mm Edwards Sapien 3 transcatheter heart valve placed via open left transfemoral approach    Past Surgical History  Procedure Laterality Date  . Nasal hemorrhage control  1980's    Clips placed to stop bleeding  . Insert / replace / remove pacemaker  2004    initial placement; [employer]    . Insert / replace / remove pacemaker  02/14/2011  . Cardiac catheterization  2004    Managed medically  . Coronary angioplasty with stent placement  05/09/1998    "1"; LAD  . Tonsillectomy      "when I was a teenager"  . Skin cancer excision  09/18/11    "have had a couple hundred of them removed since I was in my 20's"  . Cardiac catheterization    . Coronary angiogram  09/19/2011    Procedure: CORONARY ANGIOGRAM;  Surgeon: Josue Hector, MD;  Location[facility] CATH LAB;  Service: Cardiovascular;;  . Cardiac catheterization N/A 04/11/2015    Procedure: Right/Left Heart Cath and Coronary Angiography;  Surgeon: Burnell Blanks, MD;  Location: [place] CV LAB;  Service: Cardiovascular;  Laterality: N/A;  . Transcatheter aortic valve replacement, transfemoral N/A 05/15/2015    Procedure: TRANSCATHETER AORTIC VALVE REPLACEMENT, TRANSFEMORAL;  Surgeon: Burnell Blanks, MD;  Location: [place] [facility];  Service: Open Heart Surgery;  Laterality: N/A;  . Tee without cardioversion N/A 05/15/2015    Procedure: TRANSESOPHAGEAL ECHOCARDIOGRAM (TEE);  Surgeon: Burnell Blanks, MD;  Location: [place];  Service: Open Heart Surgery;  Laterality: N/A;    Current Outpatient Prescriptions  Medication Sig Dispense Refill  . acetaminophen (TYLENOL) 325 MG tablet Take 650 mg by mouth every 6 (six) hours as needed for mild pain, moderate pain or fever.     Marland Kitchen amLODipine (NORVASC) 5 MG tablet Take 5 mg by mouth daily.    . clopidogrel (PLAVIX) 75 MG tablet Take 75 mg by mouth daily.      Marland Kitchen esomeprazole (NEXIUM) 40 MG capsule Take 40 mg by mouth daily.    . ferrous sulfate 325 (65 FE) MG tablet Take 325 mg by mouth every evening.     . rosuvastatin (CRESTOR) 5 MG tablet Take 5 mg by mouth daily.    . silodosin (RAPAFLO) 4 MG CAPS capsule Take 4 mg by mouth every evening.      No current facility-administered medications for this visit.    Allergies  Allergen Reactions  . Aspirin Other (See Comments)     Stomach bleeds  . Nitroglycerin     Makes patient faint    Social History   Social History  . Marital Status: Married    Spouse Name: N/A  . Number of Children: 2  . Years of Education: N/A   Occupational History  . Retired     Building control surveyor   Social History Main Topics  . Smoking status: Former Smoker -- 1.00 packs/day for 42 years    Types: Cigarettes    Quit date: 08/18/1974  . Smokeless tobacco: Former Systems developer    Types: Chew  . Alcohol Use: No  . Drug Use: No  . Sexual Activity: Yes   Other Topics Concern  . Not on file   Social History Narrative    Family History  Problem Relation Age of Onset  . Cancer Father     stomach  . Colon cancer Father   . Heart disease Brother   . Heart disease Brother   . Diabetes Other     granddaughter    Review of Systems:  As stated in the HPI and otherwise negative.   BP 138/70 mmHg  Pulse 75  Ht 5\' 4"  (1.626 m)  Wt 156 lb (70.761 kg)  BMI 26.76 kg/m2  SpO2 98%  Physical Examination: General: Well developed, well nourished, NAD HEENT: OP clear, mucus membranes moist SKIN: warm, dry. No rashes. Neuro: No focal deficits Musculoskeletal: Muscle strength 5/5 all ext Psychiatric: Mood and affect normal Neck: No JVD, no carotid bruits, no thyromegaly, no lymphadenopathy. Lungs:Clear bilaterally, no wheezes, rhonci, crackles Cardiovascular: Regular rate and rhythm. Soft systolic murmur. No gallops or rubs. Abdomen:Soft. Bowel sounds present. Non-tender.  Extremities: No lower extremity edema. Pulses are 2 + in the bilateral DP/PT.  Echo June 22, 2015:  Normal LV function. Trivial AI. Normally functioning bioprosthetic AV.  EKG:  EKG is not ordered today. The ekg ordered today demonstrates   Recent Labs: 03/09/2015: B Natriuretic Peptide 86.6 05/11/2015: ALT 22 05/16/2015: Magnesium 1.5* 05/17/2015: BUN 47*; Creatinine, Ser 1.11; Potassium 4.1; Sodium 137 05/18/2015: Hemoglobin 7.9*; Platelets 80*   Lipid Panel      Component Value Date/Time   CHOL 104 09/19/2011 0135   TRIG 66 09/19/2011 0135   HDL 38* 09/19/2011 0135   CHOLHDL 2.7 09/19/2011 0135   VLDL 13 09/19/2011 0135   LDLCALC 53 09/19/2011 0135     Wt Readings from Last 3 Encounters:  06/22/15 156 lb (70.761 kg)  05/30/15 154 lb 8 oz (70.081 kg)  05/19/15 153 lb 14.1 oz (69.8 kg)     Other studies Reviewed: Additional  studies/ records that were reviewed today include: Echo images, cath report. Hospital records.  Review of the above records demonstrates:   Assessment and Plan:   1. Severe aortic valve stenosis: He is now s/p TAVR. He is doing well. NYHA class 2 symptoms. Much improved. Echo today shows normally functioning bioprosthetic aortic valve and normal LV systolic function.  Weight is stable. Only mild dyspnea with heavy exertion. Continue Plavix. Will need antibiotic prophylaxis before procedures.   2. Anemia: Will check CBC today.    Current medicines are reviewed at length with the patient today.  The patient does not have concerns regarding medicines.  The following changes have been made:  no change  Labs/ tests ordered today include:   Orders Placed This Encounter  Procedures  . CBC with Differential/Platelet    Disposition:   FU with me following the cath   Signed, Lauree Chandler, MD 06/22/2015 12:37 PM    [facility] [place], [place], [place]  [address] Phone: [phone]; Fax: [phone]

## 2015-07-11 ENCOUNTER — Other Ambulatory Visit: Payer: Self-pay | Admitting: Dermatology

## 2015-07-28 ENCOUNTER — Other Ambulatory Visit: Payer: Self-pay | Admitting: Cardiology

## 2015-08-07 ENCOUNTER — Ambulatory Visit: Payer: Medicare Other | Admitting: Vascular Surgery

## 2015-08-07 ENCOUNTER — Other Ambulatory Visit (HOSPITAL_COMMUNITY): Payer: Medicare Other

## 2015-08-07 ENCOUNTER — Encounter: Payer: Self-pay | Admitting: Vascular Surgery

## 2015-08-14 ENCOUNTER — Encounter: Payer: Self-pay | Admitting: Internal Medicine

## 2015-08-14 ENCOUNTER — Ambulatory Visit (INDEPENDENT_AMBULATORY_CARE_PROVIDER_SITE_OTHER): Payer: Medicare Other | Admitting: Vascular Surgery

## 2015-08-14 ENCOUNTER — Ambulatory Visit (INDEPENDENT_AMBULATORY_CARE_PROVIDER_SITE_OTHER): Payer: Medicare Other | Admitting: Internal Medicine

## 2015-08-14 ENCOUNTER — Encounter: Payer: Self-pay | Admitting: Vascular Surgery

## 2015-08-14 VITALS — BP 136/60 | HR 84 | Ht 64.5 in | Wt 160.6 lb

## 2015-08-14 VITALS — BP 135/79 | HR 86 | Temp 98.0°F | Resp 16 | Ht 65.0 in | Wt 158.0 lb

## 2015-08-14 DIAGNOSIS — I350 Nonrheumatic aortic (valve) stenosis: Secondary | ICD-10-CM | POA: Diagnosis not present

## 2015-08-14 DIAGNOSIS — Z950 Presence of cardiac pacemaker: Secondary | ICD-10-CM | POA: Diagnosis not present

## 2015-08-14 DIAGNOSIS — I714 Abdominal aortic aneurysm, without rupture, unspecified: Secondary | ICD-10-CM | POA: Insufficient documentation

## 2015-08-14 DIAGNOSIS — I441 Atrioventricular block, second degree: Secondary | ICD-10-CM

## 2015-08-14 NOTE — Addendum Note (Signed)
Addended by: Dorthula Rue L on: 08/14/2015 02:18 PM   Modules accepted: Orders

## 2015-08-14 NOTE — Assessment & Plan Note (Signed)
His medtronic DDD PM is working normally. Will recheck in several months. 

## 2015-08-14 NOTE — Patient Instructions (Addendum)
Medication Instructions:  Your physician recommends that you continue on your current medications as directed. Please refer to the Current Medication list given to you today.   Labwork: None ordered   Testing/Procedures: None ordered   Follow-Up: Your physician wants you to follow-up in: 12 months with Dr Taylor You will receive a reminder letter in the mail two months in advance. If you don't receive a letter, please call our office to schedule the follow-up appointment.   Remote monitoring is used to monitor your Pacemaker  from home. This monitoring reduces the number of office visits required to check your device to one time per year. It allows us to keep an eye on the functioning of your device to ensure it is working properly. You are scheduled for a device check from home on 11/13/15. You may send your transmission at any time that day. If you have a wireless device, the transmission will be sent automatically. After your physician reviews your transmission, you will receive a postcard with your next transmission date.    Any Other Special Instructions Will Be Listed Below (If Applicable).     If you need a refill on your cardiac medications before your next appointment, please call your pharmacy.   

## 2015-08-14 NOTE — Assessment & Plan Note (Signed)
He is well after TAVR. Asymptomatic.

## 2015-08-14 NOTE — Progress Notes (Signed)
HPI Mr. Rutstein returns today for followup. He is a pleasant 81 yo man with a h/o symptomatic bradycardia, s/p PPM insertion, HTN, CAD, and dyslipidemia. In the interim, he has been stable. He denies chest pain. Minimal peripheral edema and sob. No syncope. He denies chest pain. He is still playing golf. He walks regularly as well. He has not been in the hospital since his last visit. Allergies  Allergen Reactions  . Aspirin Other (See Comments)    Stomach bleeds  . Nitroglycerin     Makes patient faint     Current Outpatient Prescriptions  Medication Sig Dispense Refill  . acetaminophen (TYLENOL) 325 MG tablet Take 650 mg by mouth every 6 (six) hours as needed for mild pain, moderate pain or fever.     . clopidogrel (PLAVIX) 75 MG tablet Take 75 mg by mouth daily.      Marland Kitchen esomeprazole (NEXIUM) 40 MG capsule Take 40 mg by mouth daily.    . ferrous sulfate 325 (65 FE) MG tablet Take 325 mg by mouth every evening.     . NORVASC 5 MG tablet TAKE 1 TABLET (5 MG TOTAL) BY MOUTH DAILY. 30 tablet 2  . rosuvastatin (CRESTOR) 5 MG tablet Take 5 mg by mouth daily.    . silodosin (RAPAFLO) 4 MG CAPS capsule Take 4 mg by mouth every evening.      No current facility-administered medications for this visit.     Past Medical History  Diagnosis Date  . CAD (coronary artery disease)     Remote stent to LAD in 1999. Last cath in 2004 and he is managed medically  . Anemia   . Hyperlipidemia   . Aortic stenosis   . BPH (benign prostatic hyperplasia)   . HTN (hypertension)   . Colon polyps   . Blood transfusion   . GERD (gastroesophageal reflux disease)   . Hemorrhoid   . Complication of anesthesia   . Dysrhythmia     paced  . Arthritis 09/18/11    "in my knees"  . Basal cell carcinoma of skin   . AAA (abdominal aortic aneurysm) ([place])   . Lung nodule     RLL  . Renal mass, left   . Pacemaker     due to bradycardia; placed in 2004  . S/P TAVR (transcatheter aortic valve replacement)  05/15/2015    26 mm Edwards Sapien 3 transcatheter heart valve placed via open left transfemoral approach    ROS:   All systems reviewed and negative except as noted in the HPI.   Past Surgical History  Procedure Laterality Date  . Nasal hemorrhage control  1980's    Clips placed to stop bleeding  . Insert / replace / remove pacemaker  2004    initial placement; Medtronic  . Insert / replace / remove pacemaker  02/14/2011  . Cardiac catheterization  2004    Managed medically  . Coronary angioplasty with stent placement  05/09/1998    "1"; LAD  . Tonsillectomy      "when I was a teenager"  . Skin cancer excision  09/18/11    "have had a couple hundred of them removed since I was in my 73's"  . Cardiac catheterization    . Coronary angiogram  09/19/2011    Procedure: CORONARY ANGIOGRAM;  Surgeon: Josue Hector, MD;  Location: [facility] CATH LAB;  Service: Cardiovascular;;  . Cardiac catheterization N/A 04/11/2015    Procedure: Right/Left Heart Cath and Coronary Angiography;  Surgeon:  Burnell Blanks, MD;  Location: [place] CV LAB;  Service: Cardiovascular;  Laterality: N/A;  . Transcatheter aortic valve replacement, transfemoral N/A 05/15/2015    Procedure: TRANSCATHETER AORTIC VALVE REPLACEMENT, TRANSFEMORAL;  Surgeon: Burnell Blanks, MD;  Location: [place];  Service: Open Heart Surgery;  Laterality: N/A;  . Tee without cardioversion N/A 05/15/2015    Procedure: TRANSESOPHAGEAL ECHOCARDIOGRAM (TEE);  Surgeon: Burnell Blanks, MD;  Location: [place];  Service: Open Heart Surgery;  Laterality: N/A;     Family History  Problem Relation Age of Onset  . Cancer Father     stomach  . Colon cancer Father   . Heart disease Brother   . Heart disease Brother   . Diabetes Other     granddaughter     Social History   Social History  . Marital Status: Married    Spouse Name: N/A  . Number of Children: 2  . Years of Education: N/A   Occupational History  . Retired      Building control surveyor   Social History Main Topics  . Smoking status: Former Smoker -- 1.00 packs/day for 42 years    Types: Cigarettes    Quit date: 08/18/1974  . Smokeless tobacco: Former Systems developer    Types: Chew  . Alcohol Use: No  . Drug Use: No  . Sexual Activity: Yes   Other Topics Concern  . Not on file   Social History Narrative     BP 136/60 mmHg  Pulse 84  Ht 5' 4.5" (1.638 m)  Wt 160 lb 9.6 oz (72.848 kg)  BMI 27.15 kg/m2  SpO2 95%  Physical Exam:  stable appearing 81 yo man, NAD HEENT: Unremarkable Neck:  6 cm JVD, no thyromegally Lungs:  Clear with no wheezes, rales, or rhonchi HEART:  Regular rate rhythm, no murmurs, no rubs, no clicks Abd:  soft, positive bowel sounds, no organomegally, no rebound, no guarding Ext:  2 plus pulses, no edema, no cyanosis, no clubbing Skin:  No rashes no nodules Neuro:  CN II through XII intact, motor grossly intact   DEVICE  Normal device function.  See PaceArt for details.   Assess/Plan:

## 2015-08-14 NOTE — Progress Notes (Signed)
Subjective:     Patient ID: Jordan [name], male   DOB: 1918/03/03, 81 y.o.   MRN: [id number]  HPI This 81 year old active male returns for continued follow-up regarding his abdominal aortic aneurysm. Patient recently had a transaortic valve replacement and is followed by Dr. Susa Loffler. Patient was found to have an aneurysm measuring 4.7 cm in maximum diameter at the time of the surgery. He denies any abdominal or back symptoms. He has remained quite active playing golf once a week and routinely shooting in the low 80s with a handicap of 10-12. He denies any lower extremity claudication symptoms.  Past Medical History  Diagnosis Date  . CAD (coronary artery disease)     Remote stent to LAD in 1999. Last cath in 2004 and he is managed medically  . Anemia   . Hyperlipidemia   . Aortic stenosis   . BPH (benign prostatic hyperplasia)   . HTN (hypertension)   . Colon polyps   . Blood transfusion   . GERD (gastroesophageal reflux disease)   . Hemorrhoid   . Complication of anesthesia   . Dysrhythmia     paced  . Arthritis 09/18/11    "in my knees"  . Basal cell carcinoma of skin   . AAA (abdominal aortic aneurysm) ([place])   . Lung nodule     RLL  . Renal mass, left   . Pacemaker     due to bradycardia; placed in 2004  . 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    26 mm Edwards Sapien 3 transcatheter heart valve placed via open left transfemoral approach    Social History  Substance Use Topics  . Smoking status: Former Smoker -- 1.00 packs/day for 42 years    Types: Cigarettes    Quit date: 08/18/1974  . Smokeless tobacco: Former Systems developer    Types: Chew  . Alcohol Use: No    Family History  Problem Relation Age of Onset  . Cancer Father     stomach  . Colon cancer Father   . Heart disease Brother   . Heart disease Brother   . Diabetes Other     granddaughter    Allergies  Allergen Reactions  . Aspirin Other (See Comments)    Stomach bleeds  .  Nitroglycerin     Makes patient faint     Current outpatient prescriptions:  .  acetaminophen (TYLENOL) 325 MG tablet, Take 650 mg by mouth every 6 (six) hours as needed for mild pain, moderate pain or fever. , Disp: , Rfl:  .  clopidogrel (PLAVIX) 75 MG tablet, Take 75 mg by mouth daily.  , Disp: , Rfl:  .  esomeprazole (NEXIUM) 40 MG capsule, Take 40 mg by mouth daily., Disp: , Rfl:  .  ferrous sulfate 325 (65 FE) MG tablet, Take 325 mg by mouth every evening. , Disp: , Rfl:  .  NORVASC 5 MG tablet, TAKE 1 TABLET (5 MG TOTAL) BY MOUTH DAILY., Disp: 30 tablet, Rfl: 2 .  rosuvastatin (CRESTOR) 5 MG tablet, Take 5 mg by mouth daily., Disp: , Rfl:  .  silodosin (RAPAFLO) 4 MG CAPS capsule, Take 4 mg by mouth every evening. , Disp: , Rfl:   Filed Vitals:   08/14/15 1236  BP: 135/79  Pulse: 86  Temp: 98 F (36.7 C)  Resp: 16  Height: 5\' 5"  (1.651 m)  Weight: 158 lb (71.668 kg)  SpO2: 98%    Body mass index is 26.29 kg/(m^2).  Review of Systems  Patient has remote history coronary artery disease with previous stent. Has GERD. Denies any active chest pain or significant dyspnea on exertion. Denies claudication symptoms.     Objective:   Physical Exam BP 135/79 mmHg  Pulse 86  Temp(Src) 98 F (36.7 C)  Resp 16  Ht 5\' 5"  (1.651 m)  Wt 158 lb (71.668 kg)  BMI 26.29 kg/m2  SpO2 98%  Gen.-alert and oriented x3 in no apparent distress HEENT normal for age Lungs no rhonchi or wheezing Cardiovascular regular rhythm no murmurs carotid pulses 3+ palpable no bruits audible Abdomen soft nontender  -4 cm pulsatile mass nontender Musculoskeletal free of  major deformities Skin clear -no rashes Neurologic normal Lower extremities 3+ femoral  Pulses palpable bilaterally with both feet well perfused  Today I reviewed this CT scan which was  Performed at the time of his  Aortic valve surgery.  Patient does appear to be some a good candidate for aortic stent graft if this  ever comes indicated       Assessment:      abdominal aortic aneurysm-4.7 cm- saccular by CT scan August 2016     Plan:      return in 6 months with duplex scan of aneurysm in our office to look for any change

## 2015-09-06 LAB — CUP PACEART INCLINIC DEVICE CHECK
Battery Remaining Longevity: 73 mo
Battery Voltage: 2.76 V
Brady Statistic AP VP Percent: 36 %
Brady Statistic AS VP Percent: 64 %
Implantable Lead Implant Date: 20040811
Implantable Lead Location: 753860
Implantable Lead Model: 4469
Lead Channel Impedance Value: 453 Ohm
Lead Channel Impedance Value: 465 Ohm
Lead Channel Pacing Threshold Amplitude: 1 V
Lead Channel Pacing Threshold Pulse Width: 0.4 ms
Lead Channel Setting Pacing Pulse Width: 0.4 ms
Lead Channel Setting Sensing Sensitivity: 1 mV
MDC IDC LEAD IMPLANT DT: 20040811
MDC IDC LEAD LOCATION: 753859
MDC IDC LEAD SERIAL: 421267
MDC IDC LEAD SERIAL: 436022
MDC IDC MSMT BATTERY IMPEDANCE: 473 Ohm
MDC IDC MSMT LEADCHNL RA IMPEDANCE VALUE: 453 Ohm
MDC IDC MSMT LEADCHNL RA PACING THRESHOLD AMPLITUDE: 0.5 V
MDC IDC MSMT LEADCHNL RA SENSING INTR AMPL: 1.4 mV
MDC IDC MSMT LEADCHNL RV IMPEDANCE VALUE: 465 Ohm
MDC IDC MSMT LEADCHNL RV PACING THRESHOLD PULSEWIDTH: 0.4 ms
MDC IDC MSMT LEADCHNL RV SENSING INTR AMPL: 8 mV
MDC IDC SESS DTM: 20161129151715
MDC IDC SET LEADCHNL RA PACING AMPLITUDE: 2 V
MDC IDC SET LEADCHNL RV PACING AMPLITUDE: 2.5 V
MDC IDC STAT BRADY AP VS PERCENT: 0 %
MDC IDC STAT BRADY AS VS PERCENT: 0 %

## 2015-10-21 ENCOUNTER — Other Ambulatory Visit: Payer: Self-pay | Admitting: Cardiology

## 2015-10-22 NOTE — Telephone Encounter (Signed)
Rx(s) sent to pharmacy electronically.  

## 2015-11-05 ENCOUNTER — Encounter: Payer: Self-pay | Admitting: Cardiology

## 2015-11-05 ENCOUNTER — Ambulatory Visit (INDEPENDENT_AMBULATORY_CARE_PROVIDER_SITE_OTHER): Payer: Medicare Other | Admitting: Cardiology

## 2015-11-05 VITALS — BP 128/70 | HR 80 | Ht 65.0 in | Wt 162.6 lb

## 2015-11-05 DIAGNOSIS — I714 Abdominal aortic aneurysm, without rupture, unspecified: Secondary | ICD-10-CM

## 2015-11-05 DIAGNOSIS — Z950 Presence of cardiac pacemaker: Secondary | ICD-10-CM

## 2015-11-05 DIAGNOSIS — I350 Nonrheumatic aortic (valve) stenosis: Secondary | ICD-10-CM | POA: Diagnosis not present

## 2015-11-05 DIAGNOSIS — Z954 Presence of other heart-valve replacement: Secondary | ICD-10-CM

## 2015-11-05 DIAGNOSIS — Z952 Presence of prosthetic heart valve: Secondary | ICD-10-CM

## 2015-11-05 DIAGNOSIS — I2510 Atherosclerotic heart disease of native coronary artery without angina pectoris: Secondary | ICD-10-CM

## 2015-11-05 NOTE — Patient Instructions (Signed)
Continue your current therapy  I will see you in 6 months.   

## 2015-11-05 NOTE — Progress Notes (Signed)
Jordan [name] Date of Birth: August 10, 1918   History of Present Illness: Jordan [name] is seen today for follow up CAD, pacemaker and AVR. He is now 82 years old. He has a history of coronary disease with remote stenting of the LAD in 1999. Cardiac catheterization January 2013 showed nonobstructive disease. He has a history of bradycardia requiring pacemaker implant. He has a history of severe aortic stenosis. Since his last visit with me he underwent TAVR on 05/14/16. He did have some bleeding and required 2 units of PRBCs. Otherwise he did very well. Follow up Echo in October looked good.   On followup today he is feeling very well. He denies any chest pain or shortness of breath. He remains active and plays golf at least once a week.  He has a 4.7 cm abdominal aortic aneurysm followed  by Dr. Kellie Simmering.   Current Outpatient Prescriptions on File Prior to Visit  Medication Sig Dispense Refill  . acetaminophen (TYLENOL) 325 MG tablet Take 650 mg by mouth every 6 (six) hours as needed for mild pain, moderate pain or fever.     . clopidogrel (PLAVIX) 75 MG tablet Take 75 mg by mouth daily.      Marland Kitchen esomeprazole (NEXIUM) 40 MG capsule Take 40 mg by mouth daily.    . ferrous sulfate 325 (65 FE) MG tablet Take 325 mg by mouth every evening.     . NORVASC 5 MG tablet TAKE 1 TABLET (5 MG TOTAL) BY MOUTH DAILY. 30 tablet 0  . rosuvastatin (CRESTOR) 5 MG tablet Take 5 mg by mouth daily.    . silodosin (RAPAFLO) 4 MG CAPS capsule Take 4 mg by mouth every evening.      No current facility-administered medications on file prior to visit.    Allergies  Allergen Reactions  . Aspirin Other (See Comments)    Stomach bleeds  . Nitroglycerin     Makes patient faint    Past Medical History  Diagnosis Date  . CAD (coronary artery disease)     Remote stent to LAD in 1999. Last cath in 2004 and he is managed medically  . Anemia   . Hyperlipidemia   . Aortic stenosis   . BPH (benign prostatic hyperplasia)    . HTN (hypertension)   . Colon polyps   . Blood transfusion   . GERD (gastroesophageal reflux disease)   . Hemorrhoid   . Complication of anesthesia   . Dysrhythmia     paced  . Arthritis 09/18/11    "in my knees"  . Basal cell carcinoma of skin   . AAA (abdominal aortic aneurysm) ([place])   . Lung nodule     RLL  . Renal mass, left   . Pacemaker     due to bradycardia; placed in 2004  . 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    26 mm Edwards Sapien 3 transcatheter heart valve placed via open left transfemoral approach    Past Surgical History  Procedure Laterality Date  . Nasal hemorrhage control  1980's    Clips placed to stop bleeding  . Insert / replace / remove pacemaker  2004    initial placement; [employer]tronic  . Insert / replace / remove pacemaker  02/14/2011  . Cardiac catheterization  2004    Managed medically  . Coronary angioplasty with stent placement  05/09/1998    "1"; LAD  . Tonsillectomy      "when I was a teenager"  . Skin  cancer excision  09/18/11    "have had a couple hundred of them removed since I was in my 35's"  . Cardiac catheterization    . Coronary angiogram  09/19/2011    Procedure: CORONARY ANGIOGRAM;  Surgeon: Josue Hector, MD;  Location: [facility] CATH LAB;  Service: Cardiovascular;;  . Cardiac catheterization N/A 04/11/2015    Procedure: Right/Left Heart Cath and Coronary Angiography;  Surgeon: Burnell Blanks, MD;  Location: [place] CV LAB;  Service: Cardiovascular;  Laterality: N/A;  . Transcatheter aortic valve replacement, transfemoral N/A 05/15/2015    Procedure: TRANSCATHETER AORTIC VALVE REPLACEMENT, TRANSFEMORAL;  Surgeon: Burnell Blanks, MD;  Location: [place];  Service: Open Heart Surgery;  Laterality: N/A;  . Tee without cardioversion N/A 05/15/2015    Procedure: TRANSESOPHAGEAL ECHOCARDIOGRAM (TEE);  Surgeon: Burnell Blanks, MD;  Location: [place];  Service: Open Heart Surgery;  Laterality: N/A;     History  Smoking status  . Former Smoker -- 1.00 packs/day for 42 years  . Types: Cigarettes  . Quit date: 08/18/1974  Smokeless tobacco  . Former Systems developer  . Types: Chew    History  Alcohol Use No    Family History  Problem Relation Age of Onset  . Cancer Father     stomach  . Colon cancer Father   . Heart disease Brother   . Heart disease Brother   . Diabetes Other     granddaughter    Review of Systems: As noted in history of present illness. All other systems were reviewed and are negative.  Physical Exam: BP 128/70 mmHg  Pulse 80  Ht 5\' 5"  (1.651 m)  Wt 73.755 kg (162 lb 9.6 oz)  BMI 27.06 kg/m2 Patient is very pleasant and in no acute distress. He looks younger than his stated age. Skin is warm and dry. Color is normal.  HEENT is unremarkable. Normocephalic/atraumatic. PERRL. Sclera are nonicteric. Neck is supple. No masses. No JVD. Lungs are clear. Cardiac exam shows a regular rate and rhythm. He has a harsh 1/6 systolic outflow murmur. No diastolic murmur. Abdomen is soft. Extremities reveal trace left ankle edema.  Gait and ROM are intact. No gross neurologic deficits noted.   LABORATORY DATA: Lab Results  Component Value Date   WBC 8.7 06/22/2015   HGB 10.8* 06/22/2015   HCT 32.0* 06/22/2015   PLT 179 06/22/2015   GLUCOSE 105* 05/17/2015   CHOL 104 09/19/2011   TRIG 66 09/19/2011   HDL 38* 09/19/2011   LDLCALC 53 09/19/2011   ALT 22 05/11/2015   AST 26 05/11/2015   NA 137 05/17/2015   K 4.1 05/17/2015   CL 107 05/17/2015   CREATININE 1.11 05/17/2015   BUN 47* 05/17/2015   CO2 27 05/17/2015   TSH 0.797 09/18/2011   INR 1.45 05/15/2015   HGBA1C 5.5 05/11/2015    Echo: 06/22/15:Study Conclusions  - Left ventricle: The cavity size was normal. Wall thickness was normal. Systolic function was normal. The estimated ejection fraction was in the range of 60% to 65%. Wall motion was normal; there were no regional wall motion abnormalities.  Doppler parameters are consistent with abnormal left ventricular relaxation (grade 1 diastolic dysfunction). - Mitral valve: Moderately calcified annulus. Moderately thickened leaflets . - Left atrium: The atrium was mildly dilated.   Assessment / Plan: 1. Coronary disease with remote stenting of the LAD in 1999. Cardiac catheterization Jan 2013 showed nonobstructive disease. He remains asymptomatic. We will continue with aspirin and statin therapy.  2. Severe aortic stenosis.s/p TAVR in August 2016. Excellent result.   3. Sinus node dysfunction with bradycardia. Status post pacemaker implant. Pacemaker followup in November was satisfactory. Follow up in pacer clinic.  4. Hyperlipidemia. On chronic Crestor therapy. Lab work followed by his primary.   5. AAA 4.7 cm. Followed by Dr. Kellie Simmering.    7. Renal mass. Followed by urology.  I will follow up in 6 months.

## 2015-11-13 ENCOUNTER — Ambulatory Visit (INDEPENDENT_AMBULATORY_CARE_PROVIDER_SITE_OTHER): Payer: Medicare Other | Admitting: *Deleted

## 2015-11-13 DIAGNOSIS — I441 Atrioventricular block, second degree: Secondary | ICD-10-CM | POA: Diagnosis not present

## 2015-11-13 DIAGNOSIS — Z950 Presence of cardiac pacemaker: Secondary | ICD-10-CM

## 2015-11-13 NOTE — Progress Notes (Signed)
Remote pacemaker transmission.   

## 2015-11-18 ENCOUNTER — Other Ambulatory Visit: Payer: Self-pay | Admitting: Cardiology

## 2015-11-19 NOTE — Telephone Encounter (Signed)
Rx(s) sent to pharmacy electronically.  

## 2015-12-04 LAB — CUP PACEART REMOTE DEVICE CHECK
Battery Remaining Longevity: 68 mo
Battery Voltage: 2.76 V
Brady Statistic AS VP Percent: 61 %
Date Time Interrogation Session: 20170228121710
Implantable Lead Implant Date: 20040811
Implantable Lead Model: 4469
Implantable Lead Model: 4470
Lead Channel Pacing Threshold Amplitude: 0.75 V
Lead Channel Pacing Threshold Pulse Width: 0.4 ms
Lead Channel Setting Pacing Amplitude: 2.5 V
Lead Channel Setting Pacing Pulse Width: 0.4 ms
Lead Channel Setting Sensing Sensitivity: 1 mV
MDC IDC LEAD IMPLANT DT: 20040811
MDC IDC LEAD LOCATION: 753859
MDC IDC LEAD LOCATION: 753860
MDC IDC LEAD SERIAL: 421267
MDC IDC LEAD SERIAL: 436022
MDC IDC MSMT BATTERY IMPEDANCE: 549 Ohm
MDC IDC MSMT LEADCHNL RA IMPEDANCE VALUE: 459 Ohm
MDC IDC MSMT LEADCHNL RA PACING THRESHOLD PULSEWIDTH: 0.4 ms
MDC IDC MSMT LEADCHNL RA SENSING INTR AMPL: 1 mV
MDC IDC MSMT LEADCHNL RV IMPEDANCE VALUE: 490 Ohm
MDC IDC MSMT LEADCHNL RV PACING THRESHOLD AMPLITUDE: 0.875 V
MDC IDC SET LEADCHNL RA PACING AMPLITUDE: 2 V
MDC IDC STAT BRADY AP VP PERCENT: 39 %
MDC IDC STAT BRADY AP VS PERCENT: 0 %
MDC IDC STAT BRADY AS VS PERCENT: 0 %

## 2015-12-05 ENCOUNTER — Encounter: Payer: Self-pay | Admitting: Cardiology

## 2015-12-21 ENCOUNTER — Encounter: Payer: Self-pay | Admitting: Cardiology

## 2016-01-08 ENCOUNTER — Other Ambulatory Visit: Payer: Self-pay | Admitting: *Deleted

## 2016-01-08 DIAGNOSIS — I350 Nonrheumatic aortic (valve) stenosis: Secondary | ICD-10-CM

## 2016-01-10 ENCOUNTER — Other Ambulatory Visit: Payer: Self-pay

## 2016-01-10 NOTE — Telephone Encounter (Signed)
Jordan M Martinique, MD at 11/05/2015 1:11 PM  4. Hyperlipidemia. On chronic Crestor therapy. Lab work followed by his primary.

## 2016-01-11 ENCOUNTER — Telehealth: Payer: Self-pay | Admitting: Cardiology

## 2016-01-11 MED ORDER — ROSUVASTATIN CALCIUM 5 MG PO TABS: 5.0000 mg | ORAL_TABLET | Freq: Every day | ORAL | Status: DC

## 2016-01-11 NOTE — Telephone Encounter (Signed)
Returned call to patient.He stated he had lipid and hepatic panels last Oct 06/2015 at PCP.Dr.Patterson's office called lab work requested.Crestor refill sent to pharmacy.

## 2016-01-11 NOTE — Telephone Encounter (Signed)
New message ° ° ° ° °Returning a call to the nurse °

## 2016-02-12 ENCOUNTER — Telehealth: Payer: Self-pay | Admitting: Cardiology

## 2016-02-12 ENCOUNTER — Encounter: Payer: Self-pay | Admitting: Vascular Surgery

## 2016-02-12 ENCOUNTER — Ambulatory Visit (INDEPENDENT_AMBULATORY_CARE_PROVIDER_SITE_OTHER): Payer: Medicare Other | Admitting: *Deleted

## 2016-02-12 DIAGNOSIS — I441 Atrioventricular block, second degree: Secondary | ICD-10-CM | POA: Diagnosis not present

## 2016-02-12 NOTE — Telephone Encounter (Signed)
LMOVM reminding pt to send remote transmission.   

## 2016-02-13 ENCOUNTER — Telehealth: Payer: Self-pay | Admitting: Internal Medicine

## 2016-02-13 ENCOUNTER — Encounter: Payer: Self-pay | Admitting: Cardiology

## 2016-02-13 NOTE — Telephone Encounter (Signed)
New Message  Pt calling to discuss remote transmission- stated that he was unable to send signal. Please call back and discuss.

## 2016-02-13 NOTE — Telephone Encounter (Signed)
Spoke w/ pt and informed him that he can send his remote transmission today since he missed yesterday transmission. Pt verbalized understanding.

## 2016-02-14 NOTE — Progress Notes (Signed)
Remote pacemaker transmission.   

## 2016-02-18 ENCOUNTER — Ambulatory Visit (HOSPITAL_COMMUNITY)
Admission: RE | Admit: 2016-02-18 | Discharge: 2016-02-18 | Disposition: A | Discharge status: Home / Self Care | Payer: Medicare Other | Source: Ambulatory Visit | Attending: Vascular Surgery | Admitting: Vascular Surgery

## 2016-02-18 ENCOUNTER — Encounter: Payer: Self-pay | Admitting: Vascular Surgery

## 2016-02-18 ENCOUNTER — Ambulatory Visit (INDEPENDENT_AMBULATORY_CARE_PROVIDER_SITE_OTHER): Payer: Medicare Other | Admitting: Vascular Surgery

## 2016-02-18 VITALS — BP 153/89 | HR 87 | Ht 65.0 in | Wt 164.3 lb

## 2016-02-18 DIAGNOSIS — E785 Hyperlipidemia, unspecified: Secondary | ICD-10-CM | POA: Insufficient documentation

## 2016-02-18 DIAGNOSIS — I714 Abdominal aortic aneurysm, without rupture, unspecified: Secondary | ICD-10-CM

## 2016-02-18 DIAGNOSIS — K219 Gastro-esophageal reflux disease without esophagitis: Secondary | ICD-10-CM | POA: Insufficient documentation

## 2016-02-18 DIAGNOSIS — I2510 Atherosclerotic heart disease of native coronary artery without angina pectoris: Secondary | ICD-10-CM | POA: Insufficient documentation

## 2016-02-18 DIAGNOSIS — I10 Essential (primary) hypertension: Secondary | ICD-10-CM | POA: Diagnosis not present

## 2016-02-18 NOTE — Progress Notes (Signed)
Subjective:     Patient ID: Jordan [name], male   DOB: 1917/10/28, 82 y.o.   MRN: [id number]  HPI this 82 year old male returns for continued follow-up regarding his infrarenal abdominal aortic aneurysm. He had a CT scan about 8 months ago which revealed the aneurysm to be 4.7 cm in maximum diameter. It did appear to be a good candidate for stent grafting. He has been stable since that time. He denies any new symptoms. He has had a previous transaortic valve replacement in August 2016 and did very well. He is quite active and plays golf frequently.  Past Medical History  Diagnosis Date  . CAD (coronary artery disease)     Remote stent to LAD in 1999. Last cath in 2004 and he is managed medically  . Anemia   . Hyperlipidemia   . Aortic stenosis   . BPH (benign prostatic hyperplasia)   . HTN (hypertension)   . Colon polyps   . Blood transfusion   . GERD (gastroesophageal reflux disease)   . Hemorrhoid   . Complication of anesthesia   . Dysrhythmia     paced  . Arthritis 09/18/11    "in my knees"  . Basal cell carcinoma of skin   . AAA (abdominal aortic aneurysm) ([place])   . Lung nodule     RLL  . Renal mass, left   . Pacemaker     due to bradycardia; placed in 2004  . 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    26 mm Edwards Sapien 3 transcatheter heart valve placed via open left transfemoral approach    Social History  Substance Use Topics  . Smoking status: Former Smoker -- 1.00 packs/day for 42 years    Types: Cigarettes    Quit date: 08/18/1974  . Smokeless tobacco: Former Systems developer    Types: Chew  . Alcohol Use: No    Family History  Problem Relation Age of Onset  . Cancer Father     stomach  . Colon cancer Father   . Heart disease Brother   . Heart disease Brother   . Diabetes Other     granddaughter    Allergies  Allergen Reactions  . Aspirin Other (See Comments)    Stomach bleeds  . Nitroglycerin     Makes patient faint     Current  outpatient prescriptions:  .  acetaminophen (TYLENOL) 325 MG tablet, Take 650 mg by mouth every 6 (six) hours as needed for mild pain, moderate pain or fever. , Disp: , Rfl:  .  clopidogrel (PLAVIX) 75 MG tablet, Take 75 mg by mouth daily.  , Disp: , Rfl:  .  esomeprazole (NEXIUM) 40 MG capsule, Take 40 mg by mouth daily., Disp: , Rfl:  .  ferrous sulfate 325 (65 FE) MG tablet, Take 325 mg by mouth every evening. , Disp: , Rfl:  .  NORVASC 5 MG tablet, TAKE 1 TABLET (5 MG TOTAL) BY MOUTH DAILY., Disp: 30 tablet, Rfl: 11 .  rosuvastatin (CRESTOR) 5 MG tablet, Take 1 tablet (5 mg total) by mouth daily., Disp: 30 tablet, Rfl: 6 .  silodosin (RAPAFLO) 4 MG CAPS capsule, Take 4 mg by mouth every evening. , Disp: , Rfl:   Filed Vitals:   02/18/16 0851 02/18/16 0853  BP: 153/86 153/89  Pulse: 87   Height: 5\' 5"  (1.651 m)   Weight: 164 lb 4.8 oz (74.526 kg)   SpO2: 96%     Body mass index is 27.34 kg/(m^2).  Review of Systems denies chest pain, dyspnea on exertion, PND, orthopnea, hemoptysis. Does have swelling in the right leg. Has history of irregular heart rate. Has had previous hematuria in the past. Has history of GERD and hypertension. Remote history of coronary artery disease with stent placement but now asymptomatic. Other systems negative and complete review of systems.     Objective:   Physical Exam BP 153/89 mmHg  Pulse 87  Ht 5\' 5"  (1.651 m)  Wt 164 lb 4.8 oz (74.526 kg)  BMI 27.34 kg/m2  SpO2 96%    Gen.-alert and oriented x3 in no apparent distress HEENT normal for age Lungs no rhonchi or wheezing Cardiovascular regular rhythm no murmurs carotid pulses 3+ palpable no bruits audible Abdomen soft nontender -4-5 cm pulsatile mass which is nontender. Musculoskeletal free of  major deformities Skin clear -no rashes Neurologic normal Lower extremities 3+ femoral and dorsalis pedis pulses palpable bilaterally with no edema on the left 1+ edema on the  right  Today I ordered duplex scan of his abdominal aorta which I reviewed and interpreted. Maxon diameter of the aneurysm is 4.6 cm.       Assessment:     Abdominal aortic aneurysm 4.6 cm by duplex scan and 4.7 cm by CT scan Appears to be good candidate for stent graft if ever indicated-patient is 83 years old quite active History of transaortic valve replacement August 2016 Remote history coronary artery disease with PT CEA and stenting GERD Hyperlipidemia Hypertension    Plan:     Return in 9 months for follow-up with duplex scan of his abdominal aortic aneurysm and see Dr. Servando Snare

## 2016-02-19 ENCOUNTER — Ambulatory Visit: Payer: Self-pay | Admitting: Vascular Surgery

## 2016-02-19 ENCOUNTER — Other Ambulatory Visit (HOSPITAL_COMMUNITY): Payer: Self-pay

## 2016-02-26 LAB — CUP PACEART REMOTE DEVICE CHECK
Battery Impedance: 600 Ohm
Battery Voltage: 2.77 V
Brady Statistic AP VS Percent: 0 %
Brady Statistic AS VS Percent: 0 %
Implantable Lead Implant Date: 20040811
Implantable Lead Location: 753859
Implantable Lead Location: 753860
Implantable Lead Model: 4470
Lead Channel Impedance Value: 453 Ohm
Lead Channel Impedance Value: 468 Ohm
Lead Channel Setting Pacing Pulse Width: 0.4 ms
MDC IDC LEAD IMPLANT DT: 20040811
MDC IDC LEAD SERIAL: 421267
MDC IDC LEAD SERIAL: 436022
MDC IDC MSMT BATTERY REMAINING LONGEVITY: 65 mo
MDC IDC SESS DTM: 20170531181552
MDC IDC SET LEADCHNL RA PACING AMPLITUDE: 2 V
MDC IDC SET LEADCHNL RV PACING AMPLITUDE: 2.5 V
MDC IDC SET LEADCHNL RV SENSING SENSITIVITY: 1 mV
MDC IDC STAT BRADY AP VP PERCENT: 36 %
MDC IDC STAT BRADY AS VP PERCENT: 64 %

## 2016-03-04 ENCOUNTER — Encounter: Payer: Self-pay | Admitting: Cardiology

## 2016-03-05 IMAGING — CT CT HEART MORP W/ CTA COR W/ SCORE W/ CA W/CM &/OR W/O CM
1 of 14 series · 1 of 19 positions shown, 2 images · IV contrast (Iodine)
Comparison: No priors.

EXAM:
OVER-READ INTERPRETATION  CT CHEST

The following report is an over-read performed by radiologist Dr.
over-read does not include interpretation of cardiac or coronary
anatomy or pathology. The coronary calcium score/coronary CTA
interpretation by the cardiologist is attached.

[Series 200: locator · axial · 0.35mm/px · z∈[-174,-174]mm · 1 of 1 slices shown, 2 images]
[im 1/1  vessel]
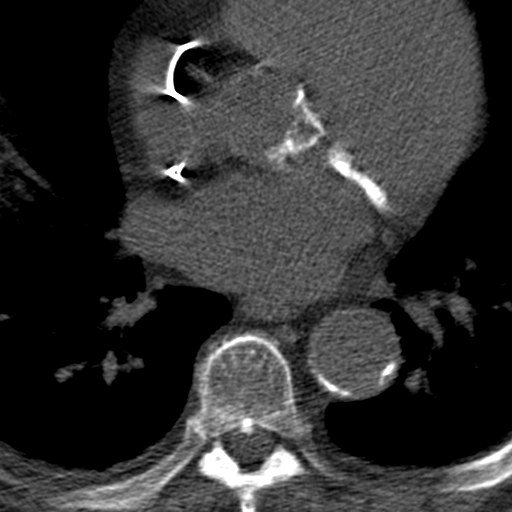
[im 1/1  lung]
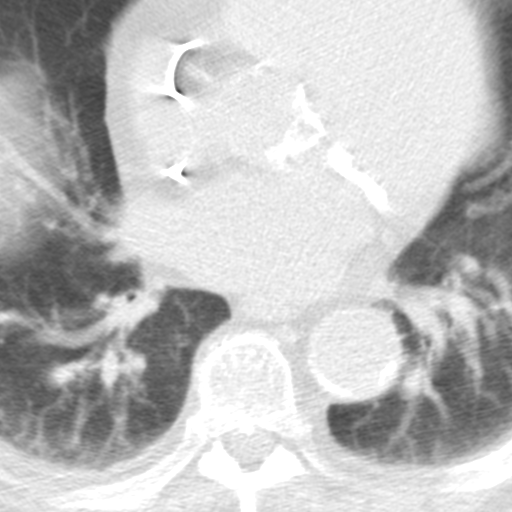

[1 of 19 positions shown; findings below may reference images not displayed]

FINDINGS: Extracardiac findings better demonstrated on contemporaneously
obtained CTA of the chest, abdomen and pelvis.
IMPRESSION: See full dictation for CTA of the chest, abdomen and pelvis
performed on 04/25/2015 for full description of extracardiac
findings.

## 2016-03-20 ENCOUNTER — Encounter: Payer: Self-pay | Admitting: Cardiology

## 2016-03-21 IMAGING — CR DG CHEST 2V
2 series · 2 of 2 positions shown · non-contrast
Comparison: 03/09/2015 .

CLINICAL DATA: Coronary artery disease.  Pacemaker.

EXAM:
CHEST  2 VIEW

[w chest pa]
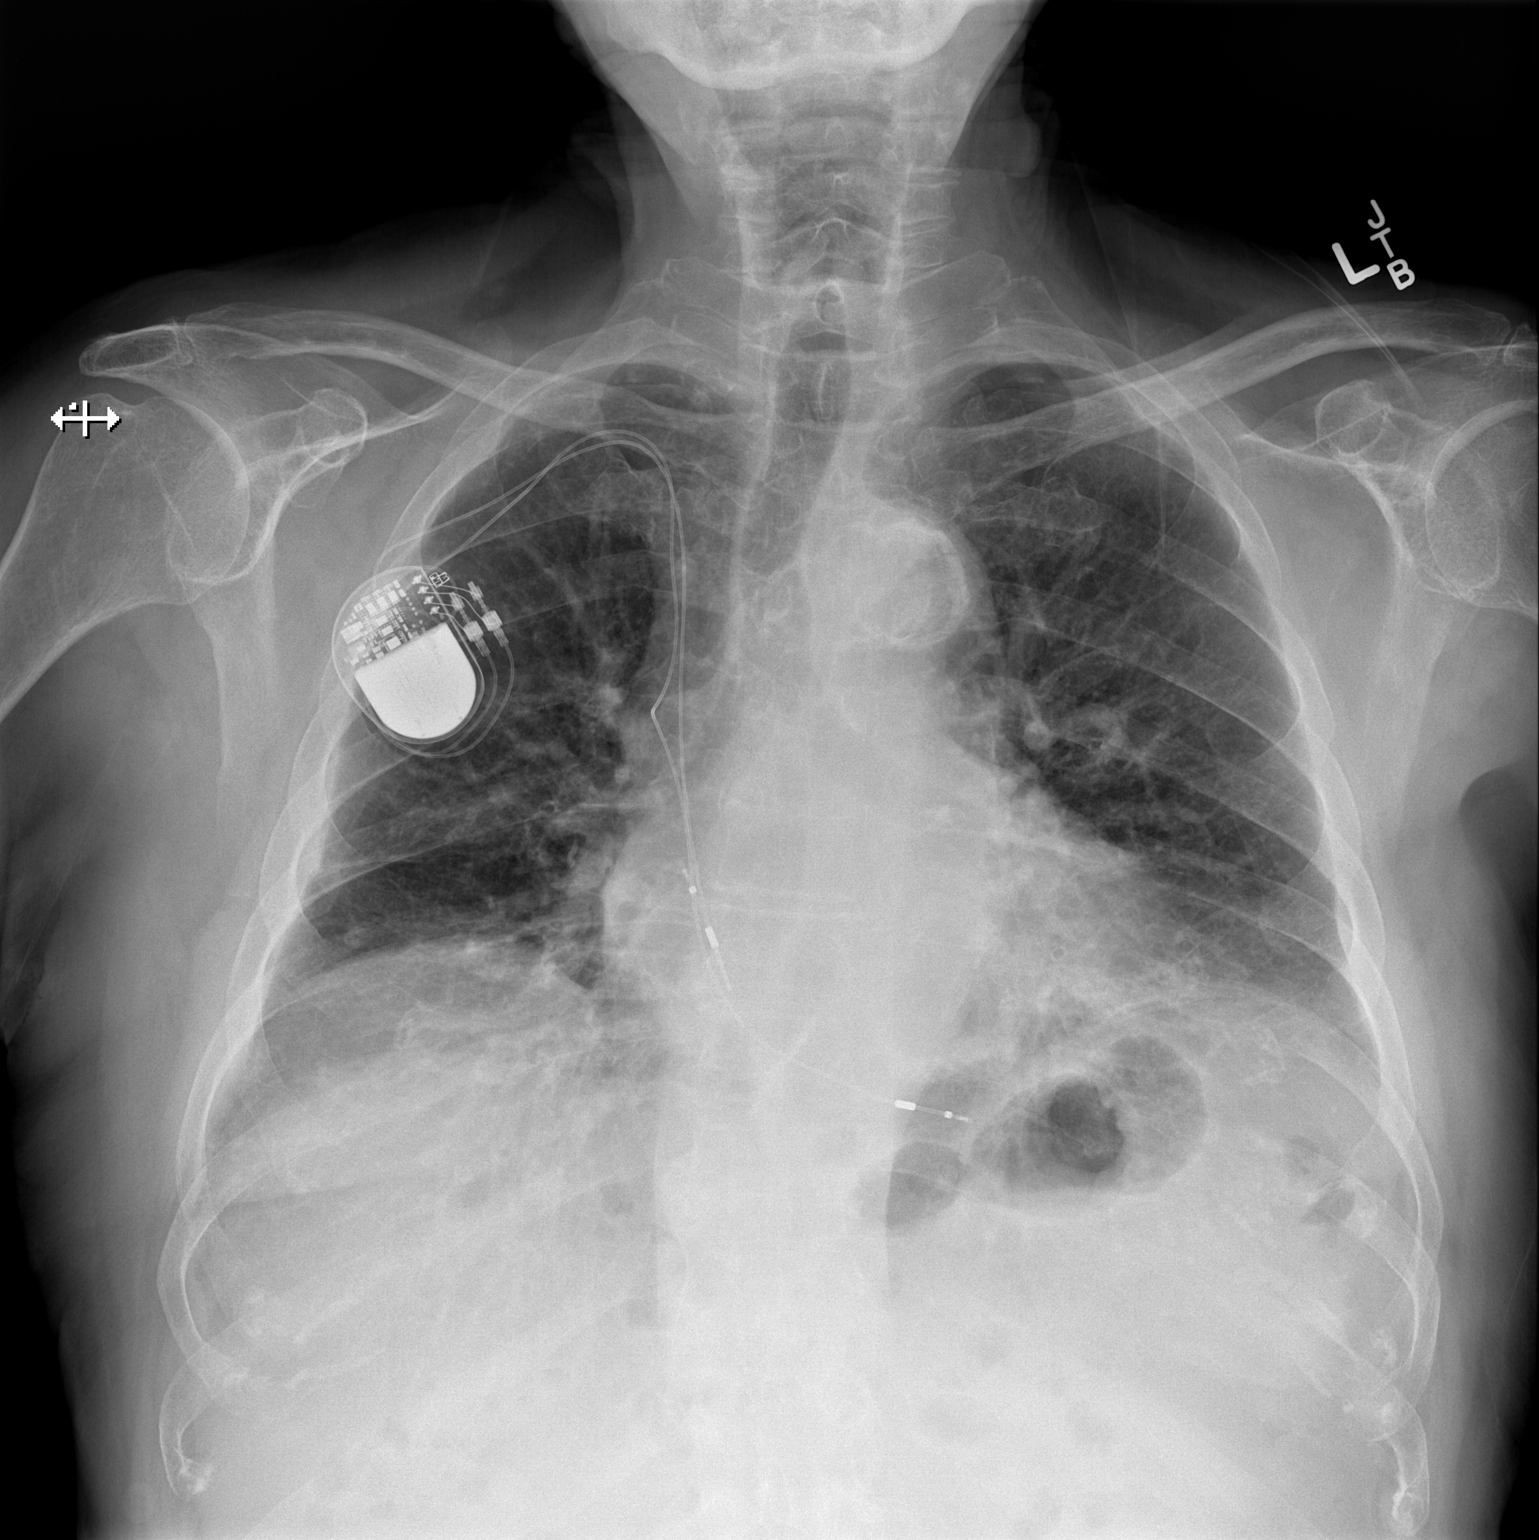

[w chest lat]
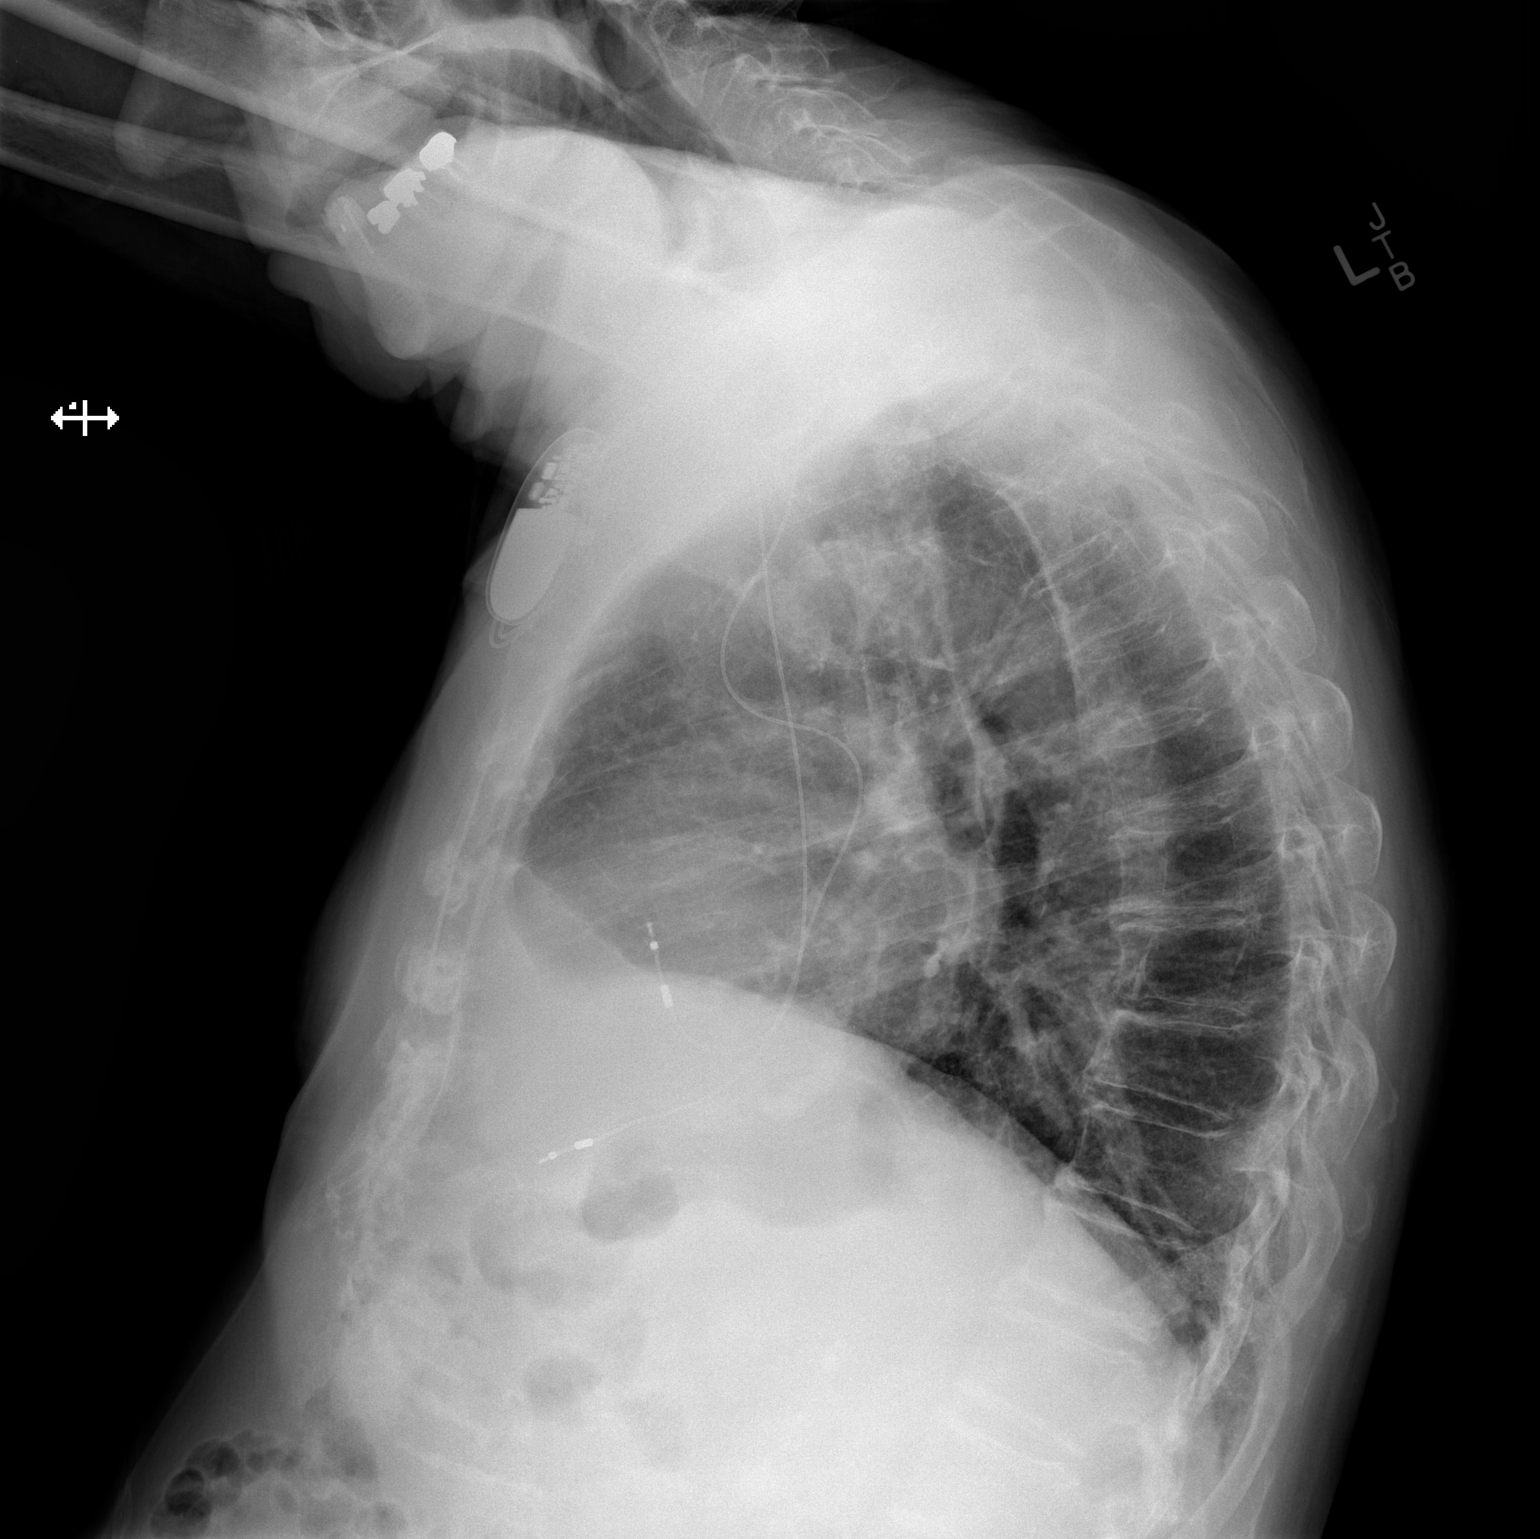

[2 of 2 positions shown; findings below may reference images not displayed]

FINDINGS: Cardiac pacer with lead tips in right atrium right ventricle.
Cardiomegaly. No pulmonary venous congestion. No focal infiltrate.
Bibasilar subsegmental atelectasis and/or pleural parenchymal
scarring. No pleural effusion or pneumothorax.
IMPRESSION: 1. Cardiac pacer with lead tips in right atrium and right ventricle.
Stable cardiomegaly.
2. Low lung volumes with bibasilar subsegmental atelectasis and/or
pleural parenchymal scarring.

## 2016-03-25 IMAGING — CR DG CHEST 1V PORT
1 series · 1 of 1 positions shown · non-contrast
Comparison: 05/11/2015

CLINICAL DATA: Postop TAVR.  Initial encounter.

EXAM:
PORTABLE CHEST - 1 VIEW

[AP]
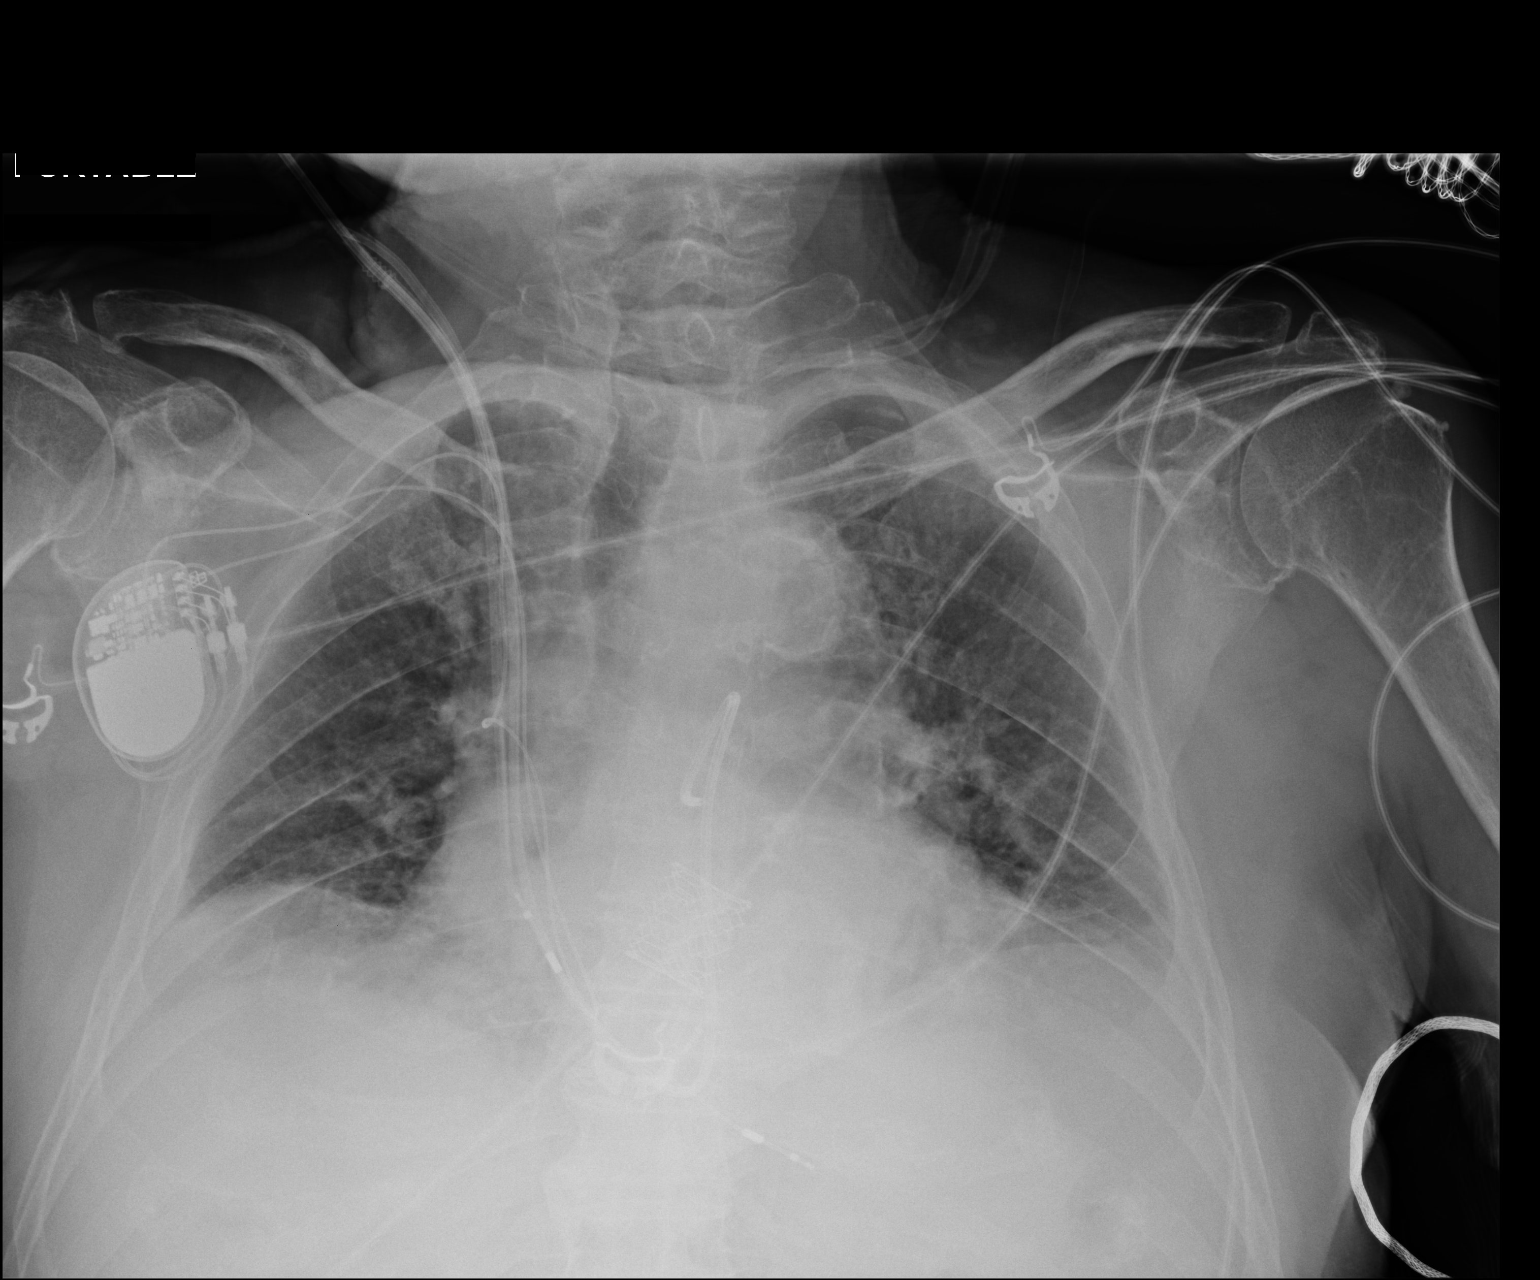

[1 of 1 positions shown; findings below may reference images not displayed]

FINDINGS: 2330 hours. Right IJ Swan-Ganz catheter appears looped in the region
of the right ventricular outflow tract or main pulmonary artery.
There is an additional right IJ central venous catheter which
appears to extend into the right atrium. Right subclavian pacemaker
leads are present. Patient has undergone interval transcatheter
aortic valve replacement. The heart size is stable. There is mildly
increased atelectasis at both lung bases. No pneumothorax or
significant pleural effusion.
IMPRESSION: No demonstrated complication following transcatheter aortic valve
replacement. Right IJ Swan-Ganz catheter appears looped in the
region of the right ventricular outflow tract or main pulmonary
artery. Mildly increased bibasilar atelectasis.

## 2016-04-03 ENCOUNTER — Telehealth: Payer: Self-pay | Admitting: Cardiology

## 2016-04-03 NOTE — Telephone Encounter (Signed)
Returned call to patient he stated he has been having swelling in right foot for the past 1 month.No pain.No injury.No sob.Stated he wanted to ask Dr.Jordan if he needs to take a fluid pill.Message sent to [place].

## 2016-04-03 NOTE — Telephone Encounter (Signed)
New message    Pt c/o swelling: STAT is pt has developed SOB within 24 hours  1. How long have you been experiencing swelling? Month   2. Where is the swelling located? Rt foot  3.  Are you currently taking a "fluid pill"? no 4.  Are you currently SOB? no 5.  Have you traveled recently? no

## 2016-04-03 NOTE — Telephone Encounter (Signed)
Returned call to patient.Dr.Jordan's recommendations given.Advised to call back if swelling does not improve.

## 2016-04-03 NOTE — Telephone Encounter (Signed)
When seen by Dr. Kellie Simmering one month ago noted to have 1+ right ankle edema. If edema is mild I would not recommend a diuretic. Elevate right leg when possible. Avoid sodium and monitor.  Peter Martinique MD, [facility]

## 2016-04-15 ENCOUNTER — Ambulatory Visit: Payer: Self-pay | Admitting: Cardiology

## 2016-04-21 ENCOUNTER — Encounter: Payer: Self-pay | Admitting: *Deleted

## 2016-05-05 ENCOUNTER — Encounter: Payer: Self-pay | Admitting: Cardiology

## 2016-05-05 ENCOUNTER — Ambulatory Visit (INDEPENDENT_AMBULATORY_CARE_PROVIDER_SITE_OTHER): Payer: Medicare Other | Admitting: Cardiology

## 2016-05-05 VITALS — BP 110/62 | HR 82 | Ht 64.0 in | Wt 165.2 lb

## 2016-05-05 DIAGNOSIS — I714 Abdominal aortic aneurysm, without rupture, unspecified: Secondary | ICD-10-CM

## 2016-05-05 DIAGNOSIS — Z954 Presence of other heart-valve replacement: Secondary | ICD-10-CM | POA: Diagnosis not present

## 2016-05-05 DIAGNOSIS — I441 Atrioventricular block, second degree: Secondary | ICD-10-CM | POA: Diagnosis not present

## 2016-05-05 DIAGNOSIS — I2510 Atherosclerotic heart disease of native coronary artery without angina pectoris: Secondary | ICD-10-CM | POA: Diagnosis not present

## 2016-05-05 DIAGNOSIS — I350 Nonrheumatic aortic (valve) stenosis: Secondary | ICD-10-CM | POA: Diagnosis not present

## 2016-05-05 DIAGNOSIS — Z952 Presence of prosthetic heart valve: Secondary | ICD-10-CM

## 2016-05-05 MED ORDER — CLOPIDOGREL BISULFATE 75 MG PO TABS: 75.0000 mg | ORAL_TABLET | Freq: Every day | ORAL | 3 refills | Status: DC

## 2016-05-05 MED ORDER — ROSUVASTATIN CALCIUM 5 MG PO TABS: 5.0000 mg | ORAL_TABLET | Freq: Every day | ORAL | 3 refills | Status: DC

## 2016-05-05 MED ORDER — AMLODIPINE BESYLATE 5 MG PO TABS: ORAL_TABLET | ORAL | 3 refills | Status: DC

## 2016-05-05 NOTE — Patient Instructions (Signed)
Continue your current therapy  I will see you in 6 months.   

## 2016-05-05 NOTE — Progress Notes (Signed)
Jordan [name] Date of Birth: 06-07-1918   History of Present Illness: Jordan [name] is seen today for follow up CAD, pacemaker and AVR. He is now 82 years old. He has a history of coronary disease with remote stenting of the LAD in 1999. Cardiac catheterization January 2013 showed nonobstructive disease. He has a history of bradycardia requiring pacemaker implant. He has a history of severe aortic stenosis. Since his last visit with me he underwent TAVR on 05/15/15. He did have some bleeding and required 2 units of PRBCs. Otherwise he did very well. Follow up Echo in October 2016 looked good.   On followup today he is feeling very well. He denies any chest pain or shortness of breath. He remains active and plays golf once a week.  He has a 4.7 cm abdominal aortic aneurysm followed  by Dr. Kellie [name]- last seen in June 2017. Not a candidate for stent grafting. He notes mild swelling in right ankle that goes down at night. He notes his 29th wedding anniversary will be in one month.  Current Outpatient Prescriptions on File Prior to Visit  Medication Sig Dispense Refill  . acetaminophen (TYLENOL) 325 MG tablet Take 650 mg by mouth every 6 (six) hours as needed for mild pain, moderate pain or fever.     . esomeprazole (NEXIUM) 40 MG capsule Take 40 mg by mouth daily.    . ferrous sulfate 325 (65 FE) MG tablet Take 325 mg by mouth every evening.     . silodosin (RAPAFLO) 4 MG CAPS capsule Take 4 mg by mouth every evening.      No current facility-administered medications on file prior to visit.     Allergies  Allergen Reactions  . Aspirin Other (See Comments)    Stomach bleeds  . Nitroglycerin     Makes patient faint    Past Medical History:  Diagnosis Date  . AAA (abdominal aortic aneurysm) ([place])   . Anemia   . Aortic stenosis   . Arthritis 09/18/11   "in my knees"  . Basal cell carcinoma of skin   . Blood transfusion   . BPH (benign prostatic hyperplasia)   . CAD (coronary artery  disease)    Remote stent to LAD in 1999. Last cath in 2004 and he is managed medically  . Colon polyps   . Complication of anesthesia   . Dysrhythmia    paced  . GERD (gastroesophageal reflux disease)   . Hemorrhoid   . HTN (hypertension)   . Hyperlipidemia   . Lung nodule    RLL  . 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 Renal mass, left   . 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 CARDIAC CATHETERIZATION  2004   Managed medically  . CARDIAC CATHETERIZATION    . CARDIAC CATHETERIZATION N/A 04/11/2015   Procedure: Right/Left Heart Cath and Coronary Angiography;  Surgeon: Burnell Blanks, MD;  Location: [facility];  Service: Cardiovascular;  Laterality: N/A;  . CORONARY ANGIOGRAM  09/19/2011   Procedure: CORONARY ANGIOGRAM;  Surgeon: Josue Hector, MD;  Location: [facility] C[facility] LAB;  Service: Cardiovascular;;  . CORONARY ANGIOPLASTY WITH STENT PLACEMENT  05/09/1998   "1"; LAD  . INSERT / REPLACE / REMOVE PACEMAKER  2004   initial placement; Medtronic  . INSERT /  REPLACE / REMOVE PACEMAKER  02/14/2011  . NASAL HEMORRHAGE CONTROL  1980's   Clips placed to stop bleeding  . SKIN CANCER EXCISION  09/18/11   "have had a couple hundred of them removed since I was in my 40's"  . TEE WITHOUT CARDIOVERSION N/A 05/15/2015   Procedure: TRANSESOPHAGEAL ECHOCARDIOGRAM (TEE);  Surgeon: Burnell Blanks, MD;  Location: [facility];  Service: Open Heart Surgery;  Laterality: N/A;  . TONSILLECTOMY     "when I was a teenager"  . TRANSCATHETER AORTIC VALVE REPLACEMENT, TRANSFEMORAL N/A 05/15/2015   Procedure: TRANSCATHETER AORTIC VALVE REPLACEMENT, TRANSFEMORAL;  Surgeon: Burnell Blanks, MD;  Location: [facility];  Service: Open Heart Surgery;  Laterality: N/A;    History  Smoking Status  . Former Smoker  .  Packs/day: 1.00  . Years: 42.00  . Types: Cigarettes  . Quit date: 08/18/1974  Smokeless Tobacco  . Former Systems developer  . Types: Chew    History  Alcohol Use No    Family History  Problem Relation Age of Onset  . Cancer Father     stomach  . Colon cancer Father   . Heart disease Brother   . Heart disease Brother   . Diabetes Other     granddaughter    Review of Systems: As noted in history of present illness. All other systems were reviewed and are negative.  Physical Exam: BP 110/62   Pulse 82   Ht 5\' 4"  (1.626 m)   Wt 165 lb 3.2 oz (74.9 kg)   BMI 28.36 kg/m  Patient is very pleasant and in no acute distress. He looks younger than his stated age. Skin is warm and dry. Color is normal.  HEENT is unremarkable. Normocephalic/atraumatic. PERRL. Sclera are nonicteric. Neck is supple. No masses. No JVD. Lungs are clear. Cardiac exam shows a regular rate and rhythm. He has a harsh 1/6 systolic outflow murmur. No diastolic murmur. Abdomen is soft. Extremities reveal trace right ankle edema.  Gait and ROM are intact. No gross neurologic deficits noted.   LABORATORY DATA: Lab Results  Component Value Date   WBC 8.7 06/22/2015   HGB 10.8 (L) 06/22/2015   HCT 32.0 (L) 06/22/2015   PLT 179 06/22/2015   GLUCOSE 105 (H) 05/17/2015   CHOL 104 09/19/2011   TRIG 66 09/19/2011   HDL 38 (L) 09/19/2011   LDLCALC 53 09/19/2011   ALT 22 05/11/2015   AST 26 05/11/2015   NA 137 05/17/2015   K 4.1 05/17/2015   CL 107 05/17/2015   CREATININE 1.11 05/17/2015   BUN 47 (H) 05/17/2015   CO2 27 05/17/2015   TSH 0.797 09/18/2011   INR 1.45 05/15/2015   HGBA1C 5.5 05/11/2015    Echo: 06/22/15:Study Conclusions  - Left ventricle: The cavity size was normal. Wall thickness was normal. Systolic function was normal. The estimated ejection fraction was in the range of 60% to 65%. Wall motion was normal; there were no regional wall motion abnormalities. Doppler parameters are consistent  with abnormal left ventricular relaxation (grade 1 diastolic dysfunction). - Mitral valve: Moderately calcified annulus. Moderately thickened leaflets . - Left atrium: The atrium was mildly dilated.  Ecg today shows NSR with atrial sensing and V  Pacing. I have personally reviewed and interpreted this study.  Labs reviewed from 07/15/16: normal CMET. Hgb 12. Cholesterol 130, trig-168, HDL 37, LDL 59.   Assessment / Plan: 1. Coronary disease with remote stenting of the LAD in 1999. Cardiac catheterization Jan 2013 showed  nonobstructive disease. He remains asymptomatic. We will continue with aspirin and statin therapy.  2. Severe aortic stenosis.s/p TAVR in August 2016. Excellent result.   3. Sinus node dysfunction with bradycardia. Status post pacemaker implant. Pacemaker followup in May was satisfactory. Follow up in pacer clinic.  4. Hyperlipidemia. On chronic Crestor therapy.   5. AAA 4.7 cm. Followed by Dr. Kellie [name]. Not a candidate for stent grafting.  6. Renal mass. Followed by urology.  I will follow up in 6 months.

## 2016-05-15 ENCOUNTER — Ambulatory Visit (INDEPENDENT_AMBULATORY_CARE_PROVIDER_SITE_OTHER): Payer: Medicare Other | Admitting: *Deleted

## 2016-05-15 DIAGNOSIS — I441 Atrioventricular block, second degree: Secondary | ICD-10-CM | POA: Diagnosis not present

## 2016-05-15 NOTE — Progress Notes (Signed)
Remote pacemaker transmission.   

## 2016-05-16 ENCOUNTER — Encounter: Payer: Self-pay | Admitting: Cardiology

## 2016-05-20 LAB — CUP PACEART REMOTE DEVICE CHECK
Battery Impedance: 703 Ohm
Battery Voltage: 2.76 V
Brady Statistic AP VP Percent: 39 %
Brady Statistic AS VP Percent: 61 %
Implantable Lead Implant Date: 20040811
Implantable Lead Implant Date: 20040811
Implantable Lead Location: 753859
Implantable Lead Serial Number: 421267
Implantable Lead Serial Number: 436022
Lead Channel Impedance Value: 453 Ohm
Lead Channel Impedance Value: 465 Ohm
Lead Channel Pacing Threshold Amplitude: 0.625 V
Lead Channel Pacing Threshold Amplitude: 0.875 V
Lead Channel Setting Pacing Amplitude: 2.5 V
Lead Channel Setting Pacing Pulse Width: 0.4 ms
MDC IDC LEAD LOCATION: 753860
MDC IDC MSMT BATTERY REMAINING LONGEVITY: 60 mo
MDC IDC MSMT LEADCHNL RA PACING THRESHOLD PULSEWIDTH: 0.4 ms
MDC IDC MSMT LEADCHNL RV PACING THRESHOLD PULSEWIDTH: 0.4 ms
MDC IDC SESS DTM: 20170831133047
MDC IDC SET LEADCHNL RA PACING AMPLITUDE: 2 V
MDC IDC SET LEADCHNL RV SENSING SENSITIVITY: 1 mV
MDC IDC STAT BRADY AP VS PERCENT: 0 %
MDC IDC STAT BRADY AS VS PERCENT: 0 %

## 2016-05-28 ENCOUNTER — Ambulatory Visit (HOSPITAL_COMMUNITY): Payer: Medicare Other | Attending: Cardiovascular Disease

## 2016-05-28 ENCOUNTER — Ambulatory Visit (INDEPENDENT_AMBULATORY_CARE_PROVIDER_SITE_OTHER): Payer: Medicare Other | Admitting: Cardiovascular Disease

## 2016-05-28 ENCOUNTER — Encounter: Payer: Self-pay | Admitting: Cardiovascular Disease

## 2016-05-28 ENCOUNTER — Other Ambulatory Visit: Payer: Self-pay

## 2016-05-28 VITALS — BP 138/82 | HR 90 | Ht 64.0 in | Wt 164.8 lb

## 2016-05-28 DIAGNOSIS — Z87891 Personal history of nicotine dependence: Secondary | ICD-10-CM | POA: Insufficient documentation

## 2016-05-28 DIAGNOSIS — I2510 Atherosclerotic heart disease of native coronary artery without angina pectoris: Secondary | ICD-10-CM | POA: Diagnosis not present

## 2016-05-28 DIAGNOSIS — I350 Nonrheumatic aortic (valve) stenosis: Secondary | ICD-10-CM

## 2016-05-28 DIAGNOSIS — D649 Anemia, unspecified: Secondary | ICD-10-CM | POA: Insufficient documentation

## 2016-05-28 DIAGNOSIS — I714 Abdominal aortic aneurysm, without rupture: Secondary | ICD-10-CM | POA: Diagnosis not present

## 2016-05-28 DIAGNOSIS — I4430 Unspecified atrioventricular block: Secondary | ICD-10-CM | POA: Insufficient documentation

## 2016-05-28 DIAGNOSIS — I340 Nonrheumatic mitral (valve) insufficiency: Secondary | ICD-10-CM | POA: Diagnosis not present

## 2016-05-28 DIAGNOSIS — I517 Cardiomegaly: Secondary | ICD-10-CM | POA: Diagnosis not present

## 2016-05-28 DIAGNOSIS — E785 Hyperlipidemia, unspecified: Secondary | ICD-10-CM | POA: Insufficient documentation

## 2016-05-28 DIAGNOSIS — Z952 Presence of prosthetic heart valve: Secondary | ICD-10-CM

## 2016-05-28 DIAGNOSIS — Z954 Presence of other heart-valve replacement: Secondary | ICD-10-CM

## 2016-05-28 NOTE — Progress Notes (Signed)
Chief Complaint  Patient presents with  . Aortic Stenosis     History of Present Illness: 82 yo Jordan [name] with history of CAD, HLD, anemia, symptomatic bradycardia now s/p PPM placement, HTN, BPH, GERD and severe aortic valve stenosis now s/p TAVR who is here today in the valve clinic for one year TAVR follow up. His cardiac issues are followed by Dr. Martinique and his pacemaker is followed by DR. Lovena Le. He underwent TAVR for severe AS on 05/15/15 with a 26 mm Edwards Sapien 3 bioprosthetic valve delivered from the left transfemoral approach. He did have profound post-op anemia felt to be due to excessive leaking of blood around the femoral sheath during the case. He was transfused and did well.   He is here today for follow up.  He is feeling well. No chest pain or dyspnea. His back hurts after he works hard in the yard. He has mild right LE edema that resolves at night. He will be married 75 years in several week. He is still playing golf.    Primary Care Physician: Donnajean Lopes, MD Primary Cardiology: [place]   Past Medical History:  Diagnosis Date  . AAA (abdominal aortic aneurysm) ([place])   . Anemia   . Aortic stenosis   . Arthritis 09/18/11   "in my knees"  . Basal cell carcinoma of skin   . Blood transfusion   . BPH (benign prostatic hyperplasia)   . CAD (coronary artery disease)    Remote stent to LAD in 1999. Last cath in 2004 and he is managed medically  . Colon polyps   . Complication of anesthesia   . Dysrhythmia    paced  . GERD (gastroesophageal reflux disease)   . Hemorrhoid   . HTN (hypertension)   . Hyperlipidemia   . Lung nodule    RLL  . 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 Renal mass, left   . 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 CARDIAC CATHETERIZATION  2004   Managed  medically  . CARDIAC CATHETERIZATION    . CARDIAC CATHETERIZATION N/A 04/11/2015   Procedure: Right/Left Heart Cath and Coronary Angiography;  Surgeon: Burnell Blanks, MD;  Location: [facility];  Service: Cardiovascular;  Laterality: N/A;  . CORONARY ANGIOGRAM  09/19/2011   Procedure: CORONARY ANGIOGRAM;  Surgeon: Josue Hector, MD;  Location: [facility] CATH LAB;  Service: Cardiovascular;;  . CORONARY ANGIOPLASTY WITH STENT PLACEMENT  05/09/1998   "1"; LAD  . INSERT / REPLACE / REMOVE PACEMAKER  2004   initial placement; [employer]tronic  . INSERT / REPLACE / REMOVE PACEMAKER  02/14/2011  . NASAL HEMORRHAGE CONTROL  1980's   Clips placed to stop bleeding  . SKIN CANCER EXCISION  09/18/11   "have had a couple hundred of them removed since I was in my 40's"  . TEE WITHOUT CARDIOVERSION N/A 05/15/2015   Procedure: TRANSESOPHAGEAL ECHOCARDIOGRAM (TEE);  Surgeon: Burnell Blanks, MD;  Location: [facility];  Service: Open Heart Surgery;  Laterality: N/A;  . TONSILLECTOMY     "when I was a teenager"  . TRANSCATHETER AORTIC VALVE REPLACEMENT, TRANSFEMORAL N/A 05/15/2015   Procedure: TRANSCATHETER AORTIC VALVE REPLACEMENT, TRANSFEMORAL;  Surgeon: Burnell Blanks, MD;  Location: [facility];  Service: Open Heart Surgery;  Laterality: N/A;  Current Outpatient Prescriptions  Medication Sig Dispense Refill  . acetaminophen (TYLENOL) 325 MG tablet Take 650 mg by mouth every 6 (six) hours as needed for mild pain, moderate pain or fever.     Marland Kitchen amLODipine (NORVASC) 5 MG tablet TAKE 1 TABLET (5 MG TOTAL) BY MOUTH DAILY. 90 tablet 3  . clopidogrel (PLAVIX) 75 MG tablet Take 1 tablet (75 mg total) by mouth daily. 90 tablet 3  . esomeprazole (NEXIUM) 40 MG capsule Take 40 mg by mouth daily.    . ferrous sulfate 325 (65 FE) MG tablet Take 325 mg by mouth every evening.     . rosuvastatin (CRESTOR) 5 MG tablet Take 1 tablet (5 mg total) by mouth daily. 90 tablet 3  . silodosin (RAPAFLO) 4 MG CAPS capsule Take  4 mg by mouth every evening.      No current facility-administered medications for this visit.     Allergies  Allergen Reactions  . Aspirin Other (See Comments)    Stomach bleeds  . Nitroglycerin     Makes patient faint    Social History   Social History  . Marital status: Married    Spouse name: N/A  . Number of children: 2  . Years of education: N/A   Occupational History  . Retired     Building control surveyor   Social History Main Topics  . Smoking status: Former Smoker    Packs/day: 1.00    Years: 42.00    Types: Cigarettes    Quit date: 08/18/1974  . Smokeless tobacco: Former Systems developer    Types: Chew  . Alcohol use No  . Drug use: No  . Sexual activity: Yes   Other Topics Concern  . Not on file   Social History Narrative  . No narrative on file    Family History  Problem Relation Age of Onset  . Cancer Father     stomach  . Colon cancer Father   . Heart disease Brother   . Heart disease Brother   . Diabetes Other     granddaughter    Review of Systems:  As stated in the HPI and otherwise negative.   BP 138/82   Pulse 90   Ht 5\' 4"  (1.626 m)   Wt 164 lb 12.8 oz (74.8 kg)   BMI 28.29 kg/m   Physical Examination: General: Well developed, well nourished, NAD  HEENT: OP clear, mucus membranes moist  SKIN: warm, dry. No rashes. Neuro: No focal deficits  Musculoskeletal: Muscle strength 5/5 all ext  Psychiatric: Mood and affect normal  Neck: No JVD, no carotid bruits, no thyromegaly, no lymphadenopathy.  Lungs:Clear bilaterally, no wheezes, rhonci, crackles Cardiovascular: Regular rate and rhythm. Soft systolic murmur. No gallops or rubs. Abdomen:Soft. Bowel sounds present. Non-tender.  Extremities: No lower extremity edema. Pulses are 2 + in the bilateral DP/PT.  Echo today: - Left ventricle: The cavity size was mildly dilated. Wall   thickness was increased in a pattern of moderate LVH. Systolic   function was normal. The estimated ejection fraction was in  the   range of 60% to 65%. Doppler parameters are consistent with   abnormal left ventricular relaxation (grade 1 diastolic   dysfunction). - Aortic valve: 26 mm Sapien 3 valve in excellent position with no   perivalvular regurgitation and stable gradients. - Mitral valve: Calcified annulus. Moderately thickened leaflets .   There was mild regurgitation. Valve area by pressure half-time:   2.5 cm^2. - Left atrium: The atrium was  moderately dilated. - Atrial septum: No defect or patent foramen ovale was identified.  EKG:  EKG is not ordered today. The ekg ordered today demonstrates   Recent Labs: 06/22/2015: Hemoglobin 10.8; Platelets 179    Wt Readings from Last 3 Encounters:  05/28/16 164 lb 12.8 oz (74.8 kg)  05/05/16 165 lb 3.2 oz (74.9 kg)  02/18/16 164 lb 4.8 oz (74.5 kg)    Other studies Reviewed: Additional studies/ records that were reviewed today include: Echo images, cath report. Hospital records.  Review of the above records demonstrates:   Assessment and Plan:   1. Severe aortic valve stenosis: He is now one year s/p TAVR. He is doing well. NYHA class 1 symptoms.  I have personally reviewed his echo today. Echo today shows normally functioning bioprosthetic aortic valve and normal LV systolic function.  Weight is stable. No dyspnea or pain. Continue Plavix. Will need antibiotic prophylaxis before procedures.   He will follow up as planned with Dr. Martinique.    Current medicines are reviewed at length with the patient today.  The patient does not have concerns regarding medicines.  The following changes have been made:  no change  Labs/ tests ordered today include:   No orders of the defined types were placed in this encounter.  Signed, Lauree Chandler, MD 05/28/2016 8:26 AM    [facility] [address], [place], [place]  [address] Phone: [phone]; Fax: [phone]

## 2016-05-28 NOTE — Patient Instructions (Signed)
Medication Instructions:  Your physician recommends that you continue on your current medications as directed. Please refer to the Current Medication list given to you today.   Labwork: none  Testing/Procedures: none  Follow-Up: Follow up with Dr. Martinique as planned.   Any Other Special Instructions Will Be Listed Below (If Applicable).     If you need a refill on your cardiac medications before your next appointment, please call your pharmacy.

## 2016-05-30 ENCOUNTER — Encounter: Payer: Self-pay | Admitting: Cardiology

## 2016-08-13 ENCOUNTER — Ambulatory Visit (INDEPENDENT_AMBULATORY_CARE_PROVIDER_SITE_OTHER): Payer: Medicare Other | Admitting: Internal Medicine

## 2016-08-13 ENCOUNTER — Encounter: Payer: Self-pay | Admitting: Internal Medicine

## 2016-08-13 VITALS — BP 124/64 | HR 91 | Ht 64.0 in | Wt 166.8 lb

## 2016-08-13 DIAGNOSIS — I441 Atrioventricular block, second degree: Secondary | ICD-10-CM

## 2016-08-13 DIAGNOSIS — Z950 Presence of cardiac pacemaker: Secondary | ICD-10-CM | POA: Diagnosis not present

## 2016-08-13 LAB — CUP PACEART INCLINIC DEVICE CHECK
Battery Impedance: 807 Ohm
Battery Remaining Longevity: 56 mo
Battery Voltage: 2.75 V
Date Time Interrogation Session: 20171129161238
Implantable Lead Implant Date: 20040811
Implantable Lead Location: 753859
Implantable Lead Model: 4470
Implantable Pulse Generator Implant Date: 20120601
Lead Channel Impedance Value: 472 Ohm
Lead Channel Pacing Threshold Pulse Width: 0.4 ms
Lead Channel Pacing Threshold Pulse Width: 0.4 ms
Lead Channel Sensing Intrinsic Amplitude: 1.4 mV
Lead Channel Setting Pacing Amplitude: 2 V
Lead Channel Setting Pacing Amplitude: 2.5 V
MDC IDC LEAD IMPLANT DT: 20040811
MDC IDC LEAD LOCATION: 753860
MDC IDC LEAD SERIAL: 421267
MDC IDC LEAD SERIAL: 436022
MDC IDC MSMT LEADCHNL RA PACING THRESHOLD AMPLITUDE: 0.5 V
MDC IDC MSMT LEADCHNL RV IMPEDANCE VALUE: 493 Ohm
MDC IDC MSMT LEADCHNL RV PACING THRESHOLD AMPLITUDE: 0.75 V
MDC IDC MSMT LEADCHNL RV SENSING INTR AMPL: 11.2 mV
MDC IDC SET LEADCHNL RV PACING PULSEWIDTH: 0.4 ms
MDC IDC SET LEADCHNL RV SENSING SENSITIVITY: 1 mV
MDC IDC STAT BRADY AP VP PERCENT: 41 %
MDC IDC STAT BRADY AP VS PERCENT: 0 %
MDC IDC STAT BRADY AS VP PERCENT: 59 %
MDC IDC STAT BRADY AS VS PERCENT: 0 %

## 2016-08-13 NOTE — Progress Notes (Signed)
HPI Mr. Dittoe returns today for followup. He is a pleasant 82 yo man with a h/o symptomatic bradycardia, s/p PPM insertion, aortic stenosis, s/p TAVR, HTN, CAD, and dyslipidemia. In the interim, he has been stable. He denies chest pain. Minimal peripheral edema and sob. No syncope. He denies chest pain. He is still playing golf. He walks regularly as well. He has not been in the hospital since his last visit. Allergies  Allergen Reactions  . Aspirin Other (See Comments)    Stomach bleeds  . Nitroglycerin     Makes patient faint     Current Outpatient Prescriptions  Medication Sig Dispense Refill  . acetaminophen (TYLENOL) 325 MG tablet Take 650 mg by mouth every 6 (six) hours as needed for mild pain, moderate pain or fever.     Marland Kitchen amLODipine (NORVASC) 5 MG tablet TAKE 1 TABLET (5 MG TOTAL) BY MOUTH DAILY. 90 tablet 3  . clopidogrel (PLAVIX) 75 MG tablet Take 1 tablet (75 mg total) by mouth daily. 90 tablet 3  . esomeprazole (NEXIUM) 40 MG capsule Take 40 mg by mouth daily.    . ferrous sulfate 325 (65 FE) MG tablet Take 325 mg by mouth every evening.     . rosuvastatin (CRESTOR) 5 MG tablet Take 1 tablet (5 mg total) by mouth daily. 90 tablet 3  . silodosin (RAPAFLO) 4 MG CAPS capsule Take 4 mg by mouth every evening.      No current facility-administered medications for this visit.      Past Medical History:  Diagnosis Date  . AAA (abdominal aortic aneurysm) ([place])   . Anemia   . Aortic stenosis   . Arthritis 09/18/11   "in my knees"  . Basal cell carcinoma of skin   . Blood transfusion   . BPH (benign prostatic hyperplasia)   . CAD (coronary artery disease)    Remote stent to LAD in 1999. Last cath in 2004 and he is managed medically  . Colon polyps   . Complication of anesthesia   . Dysrhythmia    paced  . GERD (gastroesophageal reflux disease)   . Hemorrhoid   . HTN (hypertension)   . Hyperlipidemia   . Lung nodule    RLL  . Pacemaker    due to bradycardia; placed  in 2004  . Renal mass, left   . 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   Past Surgical History:  Procedure Laterality Date  . CARDIAC CATHETERIZATION  2004   Managed medically  . CARDIAC CATHETERIZATION    . CARDIAC CATHETERIZATION N/A 04/11/2015   Procedure: Right/Left Heart Cath and Coronary Angiography;  Surgeon: Burnell Blanks, MD;  Location: [facility];  Service: Cardiovascular;  Laterality: N/A;  . CORONARY ANGIOGRAM  09/19/2011   Procedure: CORONARY ANGIOGRAM;  Surgeon: Josue Hector, MD;  Location: [facility] CATH LAB;  Service: Cardiovascular;;  . CORONARY ANGIOPLASTY WITH STENT PLACEMENT  05/09/1998   "1"; LAD  . INSERT / REPLACE / REMOVE PACEMAKER  2004   initial placement; [employer]  . INSERT / REPLACE / REMOVE PACEMAKER  02/14/2011  . NASAL HEMORRHAGE CONTROL  1980's   Clips placed to stop bleeding  . SKIN CANCER EXCISION  09/18/11   "have had a couple hundred of them removed since I was in  my 65's"  . TEE WITHOUT CARDIOVERSION N/A 05/15/2015   Procedure: TRANSESOPHAGEAL ECHOCARDIOGRAM (TEE);  Surgeon: Burnell Blanks, MD;  Location: [facility];  Service: Open Heart Surgery;  Laterality: N/A;  . TONSILLECTOMY     "when I was a teenager"  . TRANSCATHETER AORTIC VALVE REPLACEMENT, TRANSFEMORAL N/A 05/15/2015   Procedure: TRANSCATHETER AORTIC VALVE REPLACEMENT, TRANSFEMORAL;  Surgeon: Burnell Blanks, MD;  Location: [facility];  Service: Open Heart Surgery;  Laterality: N/A;     Family History  Problem Relation Age of Onset  . Cancer Father     stomach  . Colon cancer Father   . Heart disease Brother   . Heart disease Brother   . Diabetes Other     granddaughter     Social History   Social History  . Marital status: Married    Spouse name: N/A  . Number of children:  2  . Years of education: N/A   Occupational History  . Retired     Building control surveyor   Social History Main Topics  . Smoking status: Former Smoker    Packs/day: 1.00    Years: 42.00    Types: Cigarettes    Quit date: 08/18/1974  . Smokeless tobacco: Former Systems developer    Types: Chew  . Alcohol use No  . Drug use: No  . Sexual activity: Yes   Other Topics Concern  . Not on file   Social History Narrative  . No narrative on file     BP 124/64   Pulse 91   Ht 5\' 4"  (1.626 m)   Wt 166 lb 12.8 oz (75.7 kg)   BMI 28.63 kg/m   Physical Exam:  stable appearing 82 yo man, NAD HEENT: Unremarkable Neck:  6 cm JVD, no thyromegally Lungs:  Clear with no wheezes, rales, or rhonchi HEART:  Regular rate rhythm, no murmurs, no rubs, no clicks Abd:  soft, positive bowel sounds, no organomegally, no rebound, no guarding Ext:  2 plus pulses, no edema, no cyanosis, no clubbing Skin:  No rashes no nodules Neuro:  CN II through XII intact, motor grossly intact   DEVICE  Normal device function.  See PaceArt for details.   Assess/Plan: 1. PPM - his Medtronic DDD PM is working normally. He will be rechecked in several months. 2. HTN - his blood pressure is well controlled. No change in meds. 3. CAD - no anginal symptoms. No change in meds. He is encouraged to continue to exercise regularly.  Mikle [email or web address].D.

## 2016-08-13 NOTE — Patient Instructions (Signed)
Medication Instructions:  Your physician recommends that you continue on your current medications as directed. Please refer to the Current Medication list given to you today.   Labwork: None Ordered   Testing/Procedures: None Ordered   Follow-Up: Your physician wants you to follow-up in: 1 year with Dr. Lovena Le. You will receive a reminder letter in the mail two months in advance. If you don't receive a letter, please call our office to schedule the follow-up appointment.  Remote monitoring is used to monitor your Pacemaker from home. This monitoring reduces the number of office visits required to check your device to one time per year. It allows [place] to keep an eye on the functioning of your device to ensure it is working properly. You are scheduled for a device check from home on 11/12/16. You may send your transmission at any time that day. If you have a wireless device, the transmission will be sent automatically. After your physician reviews your transmission, you will receive a postcard with your next transmission date.    Any Other Special Instructions Will Be Listed Below (If Applicable).     If you need a refill on your cardiac medications before your next appointment, please call your pharmacy.

## 2016-08-14 ENCOUNTER — Other Ambulatory Visit: Payer: Self-pay | Admitting: Dermatology

## 2016-09-22 ENCOUNTER — Telehealth: Payer: Self-pay | Admitting: Cardiology

## 2016-09-22 NOTE — Telephone Encounter (Signed)
Spoke to patient. He and his wife needed appts w Dr. Martinique for 6 mo f/u. Notes they had been scheduled for 2/28 but needed a different date. Requested afternoon appt. Was able to schedule them both on February 9th at 3:45 and 4pm, respectively. Pt acknowledged appt information. Aware to call if further concerns or questions. He voiced thanks.

## 2016-09-22 NOTE — Telephone Encounter (Signed)
New Message    Pt called spoke with nurse this morning, pt confused said medication is wrong, may be cancelling appointment on 11/12/16 11:30am w/ Dr Martinique. Please call him

## 2016-10-23 NOTE — Progress Notes (Signed)
Jordan [name] Date of Birth: 1918-07-25   History of Present Illness: Jordan [name] is seen today for follow up CAD, pacemaker and AVR. He is now 83 years old. He has a history of coronary disease with remote stenting of the LAD in 1999. Cardiac catheterization January 2013 showed nonobstructive disease. He has a history of bradycardia requiring pacemaker implant. He has a history of severe aortic stenosis. Since his last visit with me he underwent TAVR on 05/15/15. He did have some bleeding and required 2 units of PRBCs. Otherwise he did very well. Follow up Echo in September 2017 looked good. Pacemaker follow up in November 2017 was satisfactory.     On followup today he is feeling very well. He denies any chest pain or shortness of breath. He remains active although not playing as much golf due to the weather.   He has a 4.7 cm abdominal aortic aneurysm followed  by Dr. Kellie Simmering- last seen in June 2017. Not a candidate for stent grafting.    Current Outpatient Prescriptions on File Prior to Visit  Medication Sig Dispense Refill  . acetaminophen (TYLENOL) 325 MG tablet Take 650 mg by mouth every 6 (six) hours as needed for mild pain, moderate pain or fever.     Marland Kitchen amLODipine (NORVASC) 5 MG tablet TAKE 1 TABLET (5 MG TOTAL) BY MOUTH DAILY. 90 tablet 3  . clopidogrel (PLAVIX) 75 MG tablet Take 1 tablet (75 mg total) by mouth daily. 90 tablet 3  . esomeprazole (NEXIUM) 40 MG capsule Take 40 mg by mouth daily.    . ferrous sulfate 325 (65 FE) MG tablet Take 325 mg by mouth every evening.     . rosuvastatin (CRESTOR) 5 MG tablet Take 1 tablet (5 mg total) by mouth daily. 90 tablet 3  . silodosin (RAPAFLO) 4 MG CAPS capsule Take 4 mg by mouth every evening.      No current facility-administered medications on file prior to visit.     Allergies  Allergen Reactions  . Aspirin Other (See Comments)    Stomach bleeds  . Nitroglycerin     Makes patient faint    Past Medical History:  Diagnosis  Date  . AAA (abdominal aortic aneurysm) ([place])   . Anemia   . Aortic stenosis   . Arthritis 09/18/11   "in my knees"  . Basal cell carcinoma of skin   . Blood transfusion   . BPH (benign prostatic hyperplasia)   . CAD (coronary artery disease)    Remote stent to LAD in 1999. Last cath in 2004 and he is managed medically  . Colon polyps   . Complication of anesthesia   . Dysrhythmia    paced  . GERD (gastroesophageal reflux disease)   . Hemorrhoid   . HTN (hypertension)   . Hyperlipidemia   . Lung nodule    RLL  . 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 Renal mass, left   . 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 CARDIAC CATHETERIZATION  2004   Managed medically  . CARDIAC CATHETERIZATION    . CARDIAC CATHETERIZATION N/A 04/11/2015   Procedure: Right/Left Heart Cath and Coronary Angiography;  Surgeon: Burnell Blanks, MD;  Location: [facility];  Service: Cardiovascular;  Laterality: N/A;  . CORONARY ANGIOGRAM  09/19/2011   Procedure: CORONARY ANGIOGRAM;  Surgeon: Josue Hector, MD;  Location: [facility];  Service: Cardiovascular;;  . CORONARY ANGIOPLASTY WITH STENT PLACEMENT  05/09/1998   "1"; LAD  . INSERT / REPLACE / REMOVE PACEMAKER  2004   initial placement; [employer]  . INSERT / REPLACE / REMOVE PACEMAKER  02/14/2011  . NASAL HEMORRHAGE CONTROL  1980's   Clips placed to stop bleeding  . SKIN CANCER EXCISION  09/18/11   "have had a couple hundred of them removed since I was in my 40's"  . TEE WITHOUT CARDIOVERSION N/A 05/15/2015   Procedure: TRANSESOPHAGEAL ECHOCARDIOGRAM (TEE);  Surgeon: Burnell Blanks, MD;  Location: [facility];  Service: Open Heart Surgery;  Laterality: N/A;  . TONSILLECTOMY     "when I was a teenager"  . TRANSCATHETER AORTIC VALVE REPLACEMENT, TRANSFEMORAL  N/A 05/15/2015   Procedure: TRANSCATHETER AORTIC VALVE REPLACEMENT, TRANSFEMORAL;  Surgeon: Burnell Blanks, MD;  Location: [facility];  Service: Open Heart Surgery;  Laterality: N/A;    History  Smoking Status  . Former Smoker  . Packs/day: 1.00  . Years: 42.00  . Types: Cigarettes  . Quit date: 08/18/1974  Smokeless Tobacco  . Former Systems developer  . Types: Chew    History  Alcohol Use No    Family History  Problem Relation Age of Onset  . Cancer Father     stomach  . Colon cancer Father   . Heart disease Brother   . Heart disease Brother   . Diabetes Other     granddaughter    Review of Systems: As noted in history of present illness. All other systems were reviewed and are negative.  Physical Exam: BP 138/80   Pulse 86   Ht 5\' 4"  (1.626 m)   Wt 168 lb (76.2 kg)   BMI 28.84 kg/m  Patient is very pleasant and in no acute distress. He looks younger than his stated age. Skin is warm and dry. Color is normal.  HEENT is unremarkable. Normocephalic/atraumatic. PERRL. Sclera are nonicteric. Neck is supple. No masses. No JVD. Lungs are clear. Cardiac exam shows a regular rate and rhythm. He has a harsh 1/6 systolic outflow murmur. No diastolic murmur. Abdomen is soft. Extremities reveal trace right ankle edema.  Gait and ROM are intact. No gross neurologic deficits noted.   LABORATORY DATA: Lab Results  Component Value Date   WBC 8.7 06/22/2015   HGB 10.8 (L) 06/22/2015   HCT 32.0 (L) 06/22/2015   PLT 179 06/22/2015   GLUCOSE 105 (H) 05/17/2015   CHOL 104 09/19/2011   TRIG 66 09/19/2011   HDL 38 (L) 09/19/2011   LDLCALC 53 09/19/2011   ALT 22 05/11/2015   AST 26 05/11/2015   NA 137 05/17/2015   K 4.1 05/17/2015   CL 107 05/17/2015   CREATININE 1.11 05/17/2015   BUN 47 (H) 05/17/2015   CO2 27 05/17/2015   TSH 0.797 09/18/2011   INR 1.45 05/15/2015   HGBA1C 5.5 05/11/2015    Echo: 05/28/16:  Study Conclusions  - Left ventricle: The cavity size was mildly  dilated. Wall   thickness was increased in a pattern of moderate LVH. Systolic   function was normal. The estimated ejection fraction was in the   range of 60% to 65%. Doppler parameters are consistent with   abnormal left ventricular relaxation (grade 1 diastolic   dysfunction). - Aortic valve: 26 mm Sapien 3 valve in excellent position with no   perivalvular regurgitation  and stable gradients. - Mitral valve: Calcified annulus. Moderately thickened leaflets .   There was mild regurgitation. Valve area by pressure half-time:   2.5 cm^2. - Left atrium: The atrium was moderately dilated. - Atrial septum: No defect or patent foramen ovale was identified.   Labs reviewed from 07/15/16: normal CMET. Hgb 12. Cholesterol 130, trig-168, HDL 37, LDL 59.   Assessment / Plan: 1. Coronary disease with remote stenting of the LAD in 1999. Cardiac catheterization Jan 2013 showed nonobstructive disease. He remains asymptomatic. We will continue with Plavix and statin therapy.  2. Severe aortic stenosis.s/p TAVR in August 2016. Excellent result. Follow up Echo in September looked good.  3. Sinus node dysfunction with bradycardia. Status post pacemaker implant. Pacemaker followup in November was satisfactory. Follow up in pacer clinic.  4. Hyperlipidemia. On chronic Crestor therapy. Good control.  5. AAA 4.7 cm. Followed by Dr. Kellie Simmering. Not a candidate for stent grafting.  6. Renal mass. Followed by urology.  I will follow up in 6 months.

## 2016-10-24 ENCOUNTER — Ambulatory Visit (INDEPENDENT_AMBULATORY_CARE_PROVIDER_SITE_OTHER): Payer: Medicare Other | Admitting: Cardiology

## 2016-10-24 VITALS — BP 138/80 | HR 86 | Ht 64.0 in | Wt 168.0 lb

## 2016-10-24 DIAGNOSIS — I2510 Atherosclerotic heart disease of native coronary artery without angina pectoris: Secondary | ICD-10-CM

## 2016-10-24 DIAGNOSIS — I714 Abdominal aortic aneurysm, without rupture, unspecified: Secondary | ICD-10-CM

## 2016-10-24 DIAGNOSIS — Z952 Presence of prosthetic heart valve: Secondary | ICD-10-CM

## 2016-10-24 DIAGNOSIS — Z950 Presence of cardiac pacemaker: Secondary | ICD-10-CM

## 2016-10-24 DIAGNOSIS — I350 Nonrheumatic aortic (valve) stenosis: Secondary | ICD-10-CM | POA: Diagnosis not present

## 2016-10-24 NOTE — Patient Instructions (Signed)
Continue your other therapy  I will see you in 6 months   

## 2016-11-12 ENCOUNTER — Ambulatory Visit (INDEPENDENT_AMBULATORY_CARE_PROVIDER_SITE_OTHER): Payer: Medicare Other | Admitting: *Deleted

## 2016-11-12 ENCOUNTER — Ambulatory Visit: Payer: Self-pay | Admitting: Cardiology

## 2016-11-12 ENCOUNTER — Telehealth: Payer: Self-pay | Admitting: Cardiology

## 2016-11-12 DIAGNOSIS — I441 Atrioventricular block, second degree: Secondary | ICD-10-CM | POA: Diagnosis not present

## 2016-11-12 NOTE — Progress Notes (Signed)
Remote pacemaker transmission.   

## 2016-11-12 NOTE — Telephone Encounter (Signed)
Spoke with pt and reminded pt of remote transmission that is due today. Pt verbalized understanding.   

## 2016-11-13 ENCOUNTER — Encounter: Payer: Self-pay | Admitting: Cardiology

## 2016-11-14 LAB — CUP PACEART REMOTE DEVICE CHECK
Battery Remaining Longevity: 50 mo
Brady Statistic AS VP Percent: 62 %
Date Time Interrogation Session: 20180228183843
Implantable Lead Implant Date: 20040811
Implantable Lead Location: 753860
Implantable Lead Serial Number: 421267
Implantable Lead Serial Number: 436022
Implantable Pulse Generator Implant Date: 20120601
Lead Channel Impedance Value: 457 Ohm
Lead Channel Pacing Threshold Amplitude: 0.875 V
Lead Channel Pacing Threshold Pulse Width: 0.4 ms
Lead Channel Setting Pacing Amplitude: 2 V
Lead Channel Setting Pacing Pulse Width: 0.4 ms
MDC IDC LEAD IMPLANT DT: 20040811
MDC IDC LEAD LOCATION: 753859
MDC IDC MSMT BATTERY IMPEDANCE: 965 Ohm
MDC IDC MSMT BATTERY VOLTAGE: 2.75 V
MDC IDC MSMT LEADCHNL RA IMPEDANCE VALUE: 446 Ohm
MDC IDC MSMT LEADCHNL RA PACING THRESHOLD AMPLITUDE: 0.75 V
MDC IDC MSMT LEADCHNL RA PACING THRESHOLD PULSEWIDTH: 0.4 ms
MDC IDC SET LEADCHNL RV PACING AMPLITUDE: 2.5 V
MDC IDC SET LEADCHNL RV SENSING SENSITIVITY: 1 mV
MDC IDC STAT BRADY AP VP PERCENT: 38 %
MDC IDC STAT BRADY AP VS PERCENT: 0 %
MDC IDC STAT BRADY AS VS PERCENT: 0 %

## 2016-11-27 ENCOUNTER — Encounter: Payer: Self-pay | Admitting: Cardiology

## 2016-12-25 ENCOUNTER — Encounter: Payer: Self-pay | Admitting: Family

## 2017-01-02 ENCOUNTER — Other Ambulatory Visit (HOSPITAL_COMMUNITY): Payer: Self-pay

## 2017-01-02 ENCOUNTER — Ambulatory Visit: Payer: Self-pay | Admitting: Vascular Surgery

## 2017-01-05 ENCOUNTER — Other Ambulatory Visit: Payer: Self-pay | Admitting: Vascular Surgery

## 2017-01-05 DIAGNOSIS — I714 Abdominal aortic aneurysm, without rupture, unspecified: Secondary | ICD-10-CM

## 2017-01-06 ENCOUNTER — Ambulatory Visit (INDEPENDENT_AMBULATORY_CARE_PROVIDER_SITE_OTHER): Payer: Medicare Other | Admitting: Family

## 2017-01-06 ENCOUNTER — Encounter: Payer: Self-pay | Admitting: Family

## 2017-01-06 ENCOUNTER — Ambulatory Visit (HOSPITAL_COMMUNITY)
Admission: RE | Admit: 2017-01-06 | Discharge: 2017-01-06 | Disposition: A | Discharge status: Home / Self Care | Payer: Medicare Other | Source: Ambulatory Visit | Attending: Family | Admitting: Family

## 2017-01-06 VITALS — BP 154/81 | HR 82 | Temp 96.8°F | Resp 16 | Ht 63.0 in | Wt 166.0 lb

## 2017-01-06 DIAGNOSIS — I714 Abdominal aortic aneurysm, without rupture, unspecified: Secondary | ICD-10-CM

## 2017-01-06 NOTE — Patient Instructions (Signed)
Abdominal Aortic Aneurysm Blood pumps away from the heart through tubes (blood vessels) called arteries. Aneurysms are weak or damaged places in the wall of an artery. It bulges out like a balloon. An abdominal aortic aneurysm happens in the main artery of the body (aorta). It can burst or tear, causing bleeding inside the body. This is an emergency. It needs treatment right away. What are the causes? The exact cause is unknown. Things that could cause this problem include:  Fat and other substances building up in the lining of a tube.  Swelling of the walls of a blood vessel.  Certain tissue diseases.  Belly (abdominal) trauma.  An infection in the main artery of the body.  What increases the risk? There are things that make it more likely for you to have an aneurysm. These include:  Being over the age of 81 years old.  Having high blood pressure (hypertension).  Being a male.  Being white.  Being very overweight (obese).  Having a family history of aneurysm.  Using tobacco products.  What are the signs or symptoms? Symptoms depend on the size of the aneurysm and how fast it grows. There may not be symptoms. If symptoms occur, they can include:  Pain (belly, side, lower back, or groin).  Feeling full after eating a small amount of food.  Feeling sick to your stomach (nauseous), throwing up (vomiting), or both.  Feeling a lump in your belly that feels like it is beating (pulsating).  Feeling like you will pass out (faint).  How is this treated?  Medicine to control blood pressure and pain.  Imaging tests to see if the aneurysm gets bigger.  Surgery. How is this prevented? To lessen your chance of getting this condition:  Stop smoking. Stop chewing tobacco.  Limit or avoid alcohol.  Keep your blood pressure, blood sugar, and cholesterol within normal limits.  Eat less salt.  Eat foods low in saturated fats and cholesterol. These are found in animal and  whole dairy products.  Eat more fiber. Fiber is found in whole grains, vegetables, and fruits.  Keep a healthy weight.  Stay active and exercise often.  This information is not intended to replace advice given to you by your health care provider. Make sure you discuss any questions you have with your health care provider. Document Released: 12/27/2012 Document Revised: 02/07/2016 Document Reviewed: 10/01/2012 Elsevier Interactive Patient Education  2017 Elsevier Inc.  

## 2017-01-06 NOTE — Progress Notes (Signed)
Vitals:   01/06/17 0834  BP: (!) 170/90  Pulse: 82  Resp: 16  Temp: (!) 96.8 F (36 C)  SpO2: 96%  Weight: 166 lb (75.3 kg)  Height: 5\' 3"  (1.6 m)

## 2017-01-06 NOTE — Progress Notes (Signed)
[facility]   CC: Follow up Abdominal Aortic Aneurysm  History of Present Illness  Jordan [name] is a 83 y.o. (09/27/1917) male whom Dr. Kellie Simmering has been monitoring regarding his infrarenal abdominal aortic aneurysm. He returns today for follow up.  He had a CT scan in 2016 which revealed the aneurysm to be 4.7 cm in maximum diameter. It did appear to be a good candidate for stent grafting. He has been stable since that time.  He denies any new symptoms. He has had a previous transaortic valve replacement in August 2016 and did very well. He is quite active and plays golf frequently.  He drives and is very active for his age.  He denies any abdominal or back pain.   The patient denies claudication in his legs with walking. The patient denies any history of stroke or TIA symptoms.  Pt states that Dr. Allyson Sabal, dermatologist, is aware of the large multi hued macular lesion on the left side of his face; pt states this has been biopsied.   Pt Diabetic: No Pt smoker: former smoker, quit in 1972, smoked x 42 years   Past Medical History:  Diagnosis Date  . AAA (abdominal aortic aneurysm) ([place])   . Anemia   . Aortic stenosis   . Arthritis 09/18/11   "in my knees"  . Basal cell carcinoma of skin   . Blood transfusion   . BPH (benign prostatic hyperplasia)   . CAD (coronary artery disease)    Remote stent to LAD in 1999. Last cath in 2004 and he is managed medically  . Colon polyps   . Complication of anesthesia   . Dysrhythmia    paced  . GERD (gastroesophageal reflux disease)   . Hemorrhoid   . HTN (hypertension)   . Hyperlipidemia   . Lung nodule    RLL  . 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 Renal mass, left   . 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 CARDIAC  CATHETERIZATION  2004   Managed medically  . CARDIAC CATHETERIZATION    . CARDIAC CATHETERIZATION N/A 04/11/2015   Procedure: Right/Left Heart Cath and Coronary Angiography;  Surgeon: Burnell Blanks, MD;  Location: [facility];  Service: Cardiovascular;  Laterality: N/A;  . CORONARY ANGIOGRAM  09/19/2011   Procedure: CORONARY ANGIOGRAM;  Surgeon: Josue Hector, MD;  Location: [facility];  Service: Cardiovascular;;  . CORONARY ANGIOPLASTY WITH STENT PLACEMENT  05/09/1998   "1"; LAD  . INSERT / REPLACE / REMOVE PACEMAKER  2004   initial placement; [employer]  . INSERT / REPLACE / REMOVE PACEMAKER  02/14/2011  . NASAL HEMORRHAGE CONTROL  1980's   Clips placed to stop bleeding  . SKIN CANCER EXCISION  09/18/11   "have had a couple hundred of them removed since I was in my 40's"  . TEE WITHOUT CARDIOVERSION N/A 05/15/2015   Procedure: TRANSESOPHAGEAL ECHOCARDIOGRAM (TEE);  Surgeon: Burnell Blanks, MD;  Location: [facility];  Service: Open Heart Surgery;  Laterality: N/A;  . TONSILLECTOMY     "when I was a teenager"  . TRANSCATHETER AORTIC VALVE REPLACEMENT, TRANSFEMORAL N/A 05/15/2015   Procedure: TRANSCATHETER AORTIC VALVE REPLACEMENT, TRANSFEMORAL;  Surgeon: Burnell Blanks, MD;  Location: [facility];  Service: Open Heart Surgery;  Laterality:  N/A;   Social History Social History   Social History  . Marital status: Married    Spouse name: N/A  . Number of children: 2  . Years of education: N/A   Occupational History  . Retired     Building control surveyor   Social History Main Topics  . Smoking status: Former Smoker    Packs/day: 1.00    Years: 42.00    Types: Cigarettes    Quit date: 08/18/1974  . Smokeless tobacco: Former Systems developer    Types: Chew  . Alcohol use No  . Drug use: No  . Sexual activity: Yes   Other Topics Concern  . Not on file   Social History Narrative  . No narrative on file   Family History Family History  Problem Relation Age of Onset  . Cancer Father      stomach  . Colon cancer Father   . Heart disease Brother   . Heart disease Brother   . Diabetes Other     granddaughter    Current Outpatient Prescriptions on File Prior to Visit  Medication Sig Dispense Refill  . acetaminophen (TYLENOL) 325 MG tablet Take 650 mg by mouth every 6 (six) hours as needed for mild pain, moderate pain or fever.     Marland Kitchen amLODipine (NORVASC) 5 MG tablet TAKE 1 TABLET (5 MG TOTAL) BY MOUTH DAILY. 90 tablet 3  . clopidogrel (PLAVIX) 75 MG tablet Take 1 tablet (75 mg total) by mouth daily. 90 tablet 3  . esomeprazole (NEXIUM) 40 MG capsule Take 40 mg by mouth daily.    . ferrous sulfate 325 (65 FE) MG tablet Take 325 mg by mouth every evening.     . rosuvastatin (CRESTOR) 5 MG tablet Take 1 tablet (5 mg total) by mouth daily. 90 tablet 3  . silodosin (RAPAFLO) 4 MG CAPS capsule Take 4 mg by mouth every evening.      No current facility-administered medications on file prior to visit.    Allergies  Allergen Reactions  . Aspirin Other (See Comments)    Stomach bleeds  . Nitroglycerin     Makes patient faint    ROS: See HPI for pertinent positives and negatives.  Physical Examination  Vitals:   01/06/17 0834 01/06/17 0837  BP: (!) 170/90 (!) 154/81  Pulse: 82 82  Resp: 16   Temp: (!) 96.8 F (36 C)   SpO2: 96%   Weight: 166 lb (75.3 kg)   Height: 5\' 3"  (1.6 m)    Body mass index is 29.41 kg/m.  General: A&O x 3, WD, elderly male who appears younger than his stated age.  Pulmonary: Sym exp, respirations are non labored, good air movt, CTAB, no rales, rhonchi, or wheezing.  Cardiac: RRR, Nl S1, S2, no detected  murmur.   Carotid Bruits Right Left   Negative Negative   Aorta is not palpable Radial pulses are palpable: 1+ right and 2+ left.                          VASCULAR EXAM:  LE Pulses Right Left       FEMORAL   palpable   palpable         POPLITEAL  not palpable   not palpable       POSTERIOR TIBIAL   palpable    palpable        DORSALIS PEDIS      ANTERIOR TIBIAL  palpable   palpable      Gastrointestinal: soft, NTND, -G/R, - HSM, - masses palpated, - CVAT B.  Musculoskeletal: M/S 5/5 throughout, Extremities without ischemic changes.  Skin: Large macular lesion left side of face with various hues of brown, irregular borders.   Neurologic: CN 2-12 intact except is slightly hard of hearing, Pain and light touch intact in extremities are intact, Motor exam as listed above.  Non-Invasive Vascular Imaging  AAA Duplex (01/06/2017)  Previous size: 4.6 cm (Date: 02-18-16)  Current size:  4.5 cm (Date: 01-06-17) Limited visualization of the abdominal vasculature due to overlying bowel gas. Right CIA: 1.88 cm, Left CIA not visualized.   Medical Decision Making  The patient is a 83 y.o. male who presents with asymptomatic AAA with no increase in size, based on limited visualization.   Based on this patient's exam and diagnostic studies, the patient will follow up in 9 months with the following studies: AAA duplex.  Consideration for repair of AAA would be made when the size is 5.0 cm, growth > 1 cm/yr, and symptomatic status.  I emphasized the importance of maximal medical management including strict control of blood pressure, blood glucose, and lipid levels, antiplatelet agents, obtaining regular exercise, and continued cessation of smoking.   The patient was given information about AAA including signs, symptoms, treatment, and how to minimize the risk of enlargement and rupture of aneurysms.    The patient was advised to call 911 should the patient experience sudden onset abdominal or back pain.   Thank you for allowing [place] to participate in this patient's care.  Clemon Chambers, RN, MSN, FNP-C [facility] Office: [facility] Physician: Early  01/06/2017, 8:44  AM

## 2017-01-07 NOTE — Addendum Note (Signed)
Addended by: Lianne Cure A on: 01/07/2017 09:21 AM   Modules accepted: Orders

## 2017-02-11 ENCOUNTER — Encounter: Payer: Medicare Other | Admitting: *Deleted

## 2017-02-13 ENCOUNTER — Encounter: Payer: Self-pay | Admitting: Cardiology

## 2017-03-16 ENCOUNTER — Telehealth: Payer: Self-pay | Admitting: Cardiology

## 2017-03-16 NOTE — Telephone Encounter (Signed)
Yes; okay to take daily

## 2017-03-16 NOTE — Telephone Encounter (Signed)
Major problems documented between plavix and Prilosec(omeprazole). Nexium has a decreased probability of interaction.  May to avoid nexium if not needed, otherwise okay to take with plavix

## 2017-03-16 NOTE — Telephone Encounter (Signed)
New message    Pt daughter in law is calling for pt.   Pt c/o medication issue:  1. Name of Medication: Plavix, Nexium   2. How are you currently taking this medication (dosage and times per day)? Both once daily  3. Are you having a reaction (difficulty breathing--STAT)? No   4. What is your medication issue? Pt pharmacist instructed pt to inform the doctor about a drug interaction between Nexium and Plavix.

## 2017-03-16 NOTE — Telephone Encounter (Signed)
Returned call to patient's daughter n law Manus Gunning.She stated patient has been taking Nexium 20 mg daily with Plavix 75 mg daily.She was told to ask if ok to continue Nexium with Plavix.Message sent to pharmacy for advice.

## 2017-03-16 NOTE — Telephone Encounter (Signed)
Returned call to [employer] given.

## 2017-03-16 NOTE — Telephone Encounter (Signed)
Follow up    Pt missed your call earlier about the Nexium and Plavix

## 2017-05-09 ENCOUNTER — Other Ambulatory Visit: Payer: Self-pay | Admitting: Cardiology

## 2017-05-11 NOTE — Telephone Encounter (Signed)
REFILL 

## 2017-05-14 ENCOUNTER — Telehealth: Payer: Self-pay | Admitting: Cardiology

## 2017-05-14 NOTE — Telephone Encounter (Signed)
We discourage plavix and nexium together.  Increase risk of plavix being ineffective.  Would prefer he take protonix 40 mg qd.  Feel free to send in Rx for protonix if he needs it.

## 2017-05-14 NOTE — Telephone Encounter (Signed)
Pt c/o medication issue:  1. Name of Medication: Clopidogrel and Nexium  2. How are you currently taking this medication (dosage and times per day)? Clopidogrel (75 mg) @8am  every morning and  Nexium (20 mg) @4pm    3. Are you having a reaction (difficulty breathing--STAT)? no  4. What is your medication issue?  Went to pick up refill and received a brochure that there might be a drug interference with two medicatons

## 2017-05-14 NOTE — Telephone Encounter (Signed)
Returned call to patient's daughter.Spoke to son Louie Casa.He stated he wanted to make sure ok for father to take plavix and nexium.Message sent to pharmacy for advice.

## 2017-05-14 NOTE — Telephone Encounter (Signed)
Returned call to patient's son spoke to husband pharmacy's recommendation given.Husband wanted me to ask Dr.Jordan next week when he is back in office.

## 2017-05-20 NOTE — Telephone Encounter (Signed)
Returned call to patient no answer.LMTC. 

## 2017-05-22 MED ORDER — PANTOPRAZOLE SODIUM 40 MG PO TBEC: 40.0000 mg | DELAYED_RELEASE_TABLET | Freq: Every day | ORAL | 11 refills | Status: DC

## 2017-05-22 NOTE — Telephone Encounter (Signed)
Spoke to patient Dr.Jordan advised stop nexium and start protonix 40 mg daily.

## 2017-07-04 NOTE — Progress Notes (Signed)
Jordan [name] Date of Birth: 1917-12-02   History of Present Illness: Jordan [name] is seen today for follow up CAD, pacemaker and AVR. He is 83 years old. He has a history of coronary disease with remote stenting of the LAD in 1999. Cardiac catheterization January 2013 showed nonobstructive disease. He has a history of bradycardia requiring pacemaker implant. He has a history of severe aortic stenosis. He underwent TAVR on 05/15/15.  Follow up Echo in September 2017 looked good. Pacemaker follow up in February 2018 was satisfactory.     On followup today he is feeling  well. He denies any chest pain or shortness of breath. He does note some swelling in his ankles that comes and goes. He remains active although not playing  golf due to fact his wife is requiring more attention. 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   He has a 4.5 cm abdominal aortic aneurysm followed  by [facility]- last seen in April 2018. Not a candidate for stent grafting. He was switched from Nexium to Protonix due to possible interaction with Plavix but protonix made him feel bad so he went back to nexium.    Current Outpatient Prescriptions on File Prior to Visit  Medication Sig Dispense Refill  . acetaminophen (TYLENOL) 325 MG tablet Take 650 mg by mouth every 6 (six) hours as needed for mild pain, moderate pain or fever.     Marland Kitchen amLODipine (NORVASC) 5 MG tablet TAKE 1 TABLET (5 MG TOTAL) BY MOUTH DAILY. 90 tablet 3  . clopidogrel (PLAVIX) 75 MG tablet TAKE 1 TABLET (75 MG TOTAL) BY MOUTH DAILY. 90 tablet 1  . ferrous sulfate 325 (65 FE) MG tablet Take 325 mg by mouth every evening.     . rosuvastatin (CRESTOR) 5 MG tablet TAKE 1 TABLET (5 MG TOTAL) BY MOUTH DAILY. 90 tablet 1  . silodosin (RAPAFLO) 4 MG CAPS capsule Take 4 mg by mouth every evening.      No current facility-administered medications on file prior to visit.     Allergies  Allergen Reactions  . Aspirin Other (See Comments)    Stomach bleeds   . Nitroglycerin     Makes patient faint    Past Medical History:  Diagnosis Date  . AAA (abdominal aortic aneurysm) ([place])   . Anemia   . Aortic stenosis   . Arthritis 09/18/11   "in my knees"  . Basal cell carcinoma of skin   . Blood transfusion   . BPH (benign prostatic hyperplasia)   . CAD (coronary artery disease)    Remote stent to LAD in 1999. Last cath in 2004 and he is managed medically  . Colon polyps   . Complication of anesthesia   . Dysrhythmia    paced  . GERD (gastroesophageal reflux disease)   . Hemorrhoid   . HTN (hypertension)   . Hyperlipidemia   . Lung nodule    RLL  . 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 Renal mass, left   . 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 CARDIAC CATHETERIZATION  2004   Managed medically  . CARDIAC CATHETERIZATION    . CARDIAC CATHETERIZATION N/A 04/11/2015   Procedure: Right/Left Heart Cath and Coronary Angiography;  Surgeon: Burnell Blanks, MD;  Location: [place] [facility];  Service: Cardiovascular;  Laterality: N/A;  . CORONARY ANGIOGRAM  09/19/2011   Procedure: CORONARY ANGIOGRAM;  Surgeon: Josue Hector, MD;  Location: [facility] CATH LAB;  Service: Cardiovascular;;  . CORONARY ANGIOPLASTY WITH STENT PLACEMENT  05/09/1998   "1"; LAD  . INSERT / REPLACE / REMOVE PACEMAKER  2004   initial placement; Medtronic  . INSERT / REPLACE / REMOVE PACEMAKER  02/14/2011  . NASAL HEMORRHAGE CONTROL  1980's   Clips placed to stop bleeding  . SKIN CANCER EXCISION  09/18/11   "have had a couple hundred of them removed since I was in my 40's"  . TEE WITHOUT CARDIOVERSION N/A 05/15/2015   Procedure: TRANSESOPHAGEAL ECHOCARDIOGRAM (TEE);  Surgeon: Burnell Blanks, MD;  Location: [place];  Service: Open Heart Surgery;  Laterality: N/A;  . TONSILLECTOMY      "when I was a teenager"  . TRANSCATHETER AORTIC VALVE REPLACEMENT, TRANSFEMORAL N/A 05/15/2015   Procedure: TRANSCATHETER AORTIC VALVE REPLACEMENT, TRANSFEMORAL;  Surgeon: Burnell Blanks, MD;  Location: [place];  Service: Open Heart Surgery;  Laterality: N/A;    History  Smoking Status  . Former Smoker  . Packs/day: 1.00  . Years: 42.00  . Types: Cigarettes  . Quit date: 08/18/1974  Smokeless Tobacco  . Former Systems developer  . Types: Chew    History  Alcohol Use No    Family History  Problem Relation Age of Onset  . Cancer Father        stomach  . Colon cancer Father   . Heart disease Brother   . Heart disease Brother   . Diabetes Other        granddaughter    Review of Systems: As noted in history of present illness. All other systems were reviewed and are negative.  Physical Exam: BP (!) 142/72   Pulse 80   Ht 5\' 3"  (1.6 m)   Wt 168 lb (76.2 kg)   BMI 29.76 kg/m  GENERAL:  Well appearing elderly WM in NAD HEENT:  PERRL, EOMI, sclera are clear. Lid droop on the left. Oropharynx is clear. NECK:  No jugular venous distention, carotid upstroke brisk and symmetric, no bruits, no thyromegaly or adenopathy LUNGS:  Clear to auscultation bilaterally CHEST:  Unremarkable HEART:  RRR,  PMI not displaced or sustained,S1 and S2 within normal limits, no S3, no S4: no clicks, no rubs, grade 2/6 soft SEM RUSB.  ABD:  Soft, nontender. BS +, no masses or bruits. No hepatomegaly, no splenomegaly EXT:  2 + pulses throughout, tr-1+  edema, no cyanosis no clubbing SKIN:  Warm and dry.  No rashes NEURO:  Alert and oriented x 3. Cranial nerves II through XII intact. PSYCH:  Cognitively intact     LABORATORY DATA: Lab Results  Component Value Date   WBC 8.7 06/22/2015   HGB 10.8 (L) 06/22/2015   HCT 32.0 (L) 06/22/2015   PLT 179 06/22/2015   GLUCOSE 105 (H) 05/17/2015   CHOL 104 09/19/2011   TRIG 66 09/19/2011   HDL 38 (L) 09/19/2011   LDLCALC 53 09/19/2011   ALT 22  05/11/2015   AST 26 05/11/2015   NA 137 05/17/2015   K 4.1 05/17/2015   CL 107 05/17/2015   CREATININE 1.11 05/17/2015   BUN 47 (H) 05/17/2015   CO2 27 05/17/2015   TSH 0.797 09/18/2011   INR 1.45 05/15/2015   HGBA1C 5.5 05/11/2015    Echo: 05/28/16:  Study Conclusions  - Left ventricle:  The cavity size was mildly dilated. Wall   thickness was increased in a pattern of moderate LVH. Systolic   function was normal. The estimated ejection fraction was in the   range of 60% to 65%. Doppler parameters are consistent with   abnormal left ventricular relaxation (grade 1 diastolic   dysfunction). - Aortic valve: 26 mm Sapien 3 valve in excellent position with no   perivalvular regurgitation and stable gradients. - Mitral valve: Calcified annulus. Moderately thickened leaflets .   There was mild regurgitation. Valve area by pressure half-time:   2.5 cm^2. - Left atrium: The atrium was moderately dilated. - Atrial septum: No defect or patent foramen ovale was identified.   Labs reviewed from 07/15/16: normal CMET. Hgb 12. Cholesterol 130, trig-168, HDL 37, LDL 59.   Assessment / Plan: 1. Coronary disease with remote stenting of the LAD in 1999. Cardiac catheterization Jan 2013 showed nonobstructive disease. He remains asymptomatic. We will continue with Plavix and statin therapy.  2. Severe aortic stenosis.s/p TAVR in August 2016. Excellent result. Follow up Echo in September 2017 looked good.  3. Sinus node dysfunction with bradycardia. Status post pacemaker implant. Pacemaker followup  was satisfactory. Follow up in pacer clinic.  4. Hyperlipidemia. On chronic Crestor therapy. Labs followed by primary care.  5. AAA 4.7 cm. Followed by Dr. Kellie Simmering. Not a candidate for stent grafting. He is a poor candidate for open repair.   6. Renal mass. Followed by urology.  I will follow up in 6 months.

## 2017-07-09 ENCOUNTER — Ambulatory Visit (INDEPENDENT_AMBULATORY_CARE_PROVIDER_SITE_OTHER): Payer: Medicare Other | Admitting: Cardiology

## 2017-07-09 ENCOUNTER — Encounter: Payer: Self-pay | Admitting: Cardiology

## 2017-07-09 VITALS — BP 142/72 | HR 80 | Ht 63.0 in | Wt 168.0 lb

## 2017-07-09 DIAGNOSIS — I714 Abdominal aortic aneurysm, without rupture, unspecified: Secondary | ICD-10-CM

## 2017-07-09 DIAGNOSIS — I2510 Atherosclerotic heart disease of native coronary artery without angina pectoris: Secondary | ICD-10-CM | POA: Diagnosis not present

## 2017-07-09 DIAGNOSIS — Z950 Presence of cardiac pacemaker: Secondary | ICD-10-CM | POA: Diagnosis not present

## 2017-07-09 DIAGNOSIS — Z952 Presence of prosthetic heart valve: Secondary | ICD-10-CM | POA: Diagnosis not present

## 2017-07-09 MED ORDER — ESOMEPRAZOLE MAGNESIUM 20 MG PO CPDR: 20.0000 mg | DELAYED_RELEASE_CAPSULE | Freq: Every day | ORAL | 3 refills | Status: DC

## 2017-07-09 NOTE — Patient Instructions (Signed)
Limit your salt intake   Continue your current therapy  I will see you in 6 months.

## 2017-07-11 ENCOUNTER — Other Ambulatory Visit: Payer: Self-pay | Admitting: Cardiology

## 2017-07-18 ENCOUNTER — Other Ambulatory Visit: Payer: Self-pay | Admitting: Cardiology

## 2017-07-20 NOTE — Telephone Encounter (Signed)
REFILL 

## 2017-08-19 ENCOUNTER — Encounter: Payer: Self-pay | Admitting: Internal Medicine

## 2017-08-19 ENCOUNTER — Ambulatory Visit: Payer: Medicare Other | Admitting: Internal Medicine

## 2017-08-19 VITALS — BP 130/90 | HR 96 | Ht 63.0 in | Wt 167.2 lb

## 2017-08-19 DIAGNOSIS — Z952 Presence of prosthetic heart valve: Secondary | ICD-10-CM | POA: Diagnosis not present

## 2017-08-19 DIAGNOSIS — Z950 Presence of cardiac pacemaker: Secondary | ICD-10-CM | POA: Diagnosis not present

## 2017-08-19 DIAGNOSIS — I441 Atrioventricular block, second degree: Secondary | ICD-10-CM

## 2017-08-19 LAB — CUP PACEART INCLINIC DEVICE CHECK
Battery Remaining Longevity: 39 mo
Brady Statistic AS VP Percent: 64 %
Brady Statistic AS VS Percent: 0 %
Date Time Interrogation Session: 20181205111108
Implantable Lead Implant Date: 20040811
Implantable Lead Implant Date: 20040811
Implantable Lead Location: 753859
Implantable Lead Serial Number: 421267
Implantable Pulse Generator Implant Date: 20120601
Lead Channel Impedance Value: 472 Ohm
Lead Channel Pacing Threshold Pulse Width: 0.4 ms
Lead Channel Setting Pacing Amplitude: 2 V
Lead Channel Setting Pacing Amplitude: 2.5 V
Lead Channel Setting Pacing Pulse Width: 0.4 ms
MDC IDC LEAD LOCATION: 753860
MDC IDC LEAD SERIAL: 436022
MDC IDC MSMT BATTERY IMPEDANCE: 1376 Ohm
MDC IDC MSMT BATTERY VOLTAGE: 2.74 V
MDC IDC MSMT LEADCHNL RA PACING THRESHOLD AMPLITUDE: 0.75 V
MDC IDC MSMT LEADCHNL RA PACING THRESHOLD PULSEWIDTH: 0.4 ms
MDC IDC MSMT LEADCHNL RA SENSING INTR AMPL: 1 mV
MDC IDC MSMT LEADCHNL RV IMPEDANCE VALUE: 487 Ohm
MDC IDC MSMT LEADCHNL RV PACING THRESHOLD AMPLITUDE: 1 V
MDC IDC MSMT LEADCHNL RV SENSING INTR AMPL: 2.8 mV
MDC IDC SET LEADCHNL RV SENSING SENSITIVITY: 1 mV
MDC IDC STAT BRADY AP VP PERCENT: 36 %
MDC IDC STAT BRADY AP VS PERCENT: 0 %

## 2017-08-19 NOTE — Patient Instructions (Signed)

## 2017-08-19 NOTE — Progress Notes (Signed)
HPI [name] returns today for ongoing evaluation and management of complete heart block, aortic stenosis, status post valve replacement, permanent pacemaker insertion, hypertension, and coronary artery disease.  In the interim, he has been stable and has not been in the hospital.  He denies chest pain or shortness of breath.  He does admit to not being as active as he once was.  No syncope.  He has very mild peripheral edema. Allergies  Allergen Reactions  . Aspirin Other (See Comments)    Stomach bleeds  . Nitroglycerin     Makes patient faint     Current Outpatient Medications  Medication Sig Dispense Refill  . acetaminophen (TYLENOL) 325 MG tablet Take 650 mg by mouth every 6 (six) hours as needed for mild pain, moderate pain or fever.     . clopidogrel (PLAVIX) 75 MG tablet TAKE 1 TABLET (75 MG TOTAL) BY MOUTH DAILY. 90 tablet 1  . esomeprazole (NEXIUM) 20 MG capsule Take 1 capsule (20 mg total) by mouth daily at 12 noon. 90 capsule 3  . ferrous sulfate 325 (65 FE) MG tablet Take 325 mg by mouth every evening.     . NORVASC 5 MG tablet TAKE 1 TABLET (5 MG TOTAL) BY MOUTH DAILY. 90 tablet 2  . rosuvastatin (CRESTOR) 5 MG tablet TAKE 1 TABLET (5 MG TOTAL) BY MOUTH DAILY. 90 tablet 1  . silodosin (RAPAFLO) 4 MG CAPS capsule Take 4 mg by mouth every evening.      No current facility-administered medications for this visit.      Past Medical History:  Diagnosis Date  . AAA (abdominal aortic aneurysm) ([place])   . Anemia   . Aortic stenosis   . Arthritis 09/18/11   "in my knees"  . Basal cell carcinoma of skin   . Blood transfusion   . BPH (benign prostatic hyperplasia)   . CAD (coronary artery disease)    Remote stent to LAD in 1999. Last cath in 2004 and he is managed medically  . Colon polyps   . Complication of anesthesia   . Dysrhythmia    paced  . GERD (gastroesophageal reflux disease)   . Hemorrhoid   . HTN (hypertension)   . Hyperlipidemia   . Lung nodule     RLL  . 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 Renal mass, left   . 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   Past Surgical History:  Procedure Laterality Date  . CARDIAC CATHETERIZATION  2004   Managed medically  . CARDIAC CATHETERIZATION    . CARDIAC CATHETERIZATION N/A 04/11/2015   Procedure: Right/Left Heart Cath and Coronary Angiography;  Surgeon: Jordan Blanks, MD;  Location: [place] [facility];  Service: Cardiovascular;  Laterality: N/A;  . CORONARY ANGIOGRAM  09/19/2011   Procedure: CORONARY ANGIOGRAM;  Surgeon: Jordan Hector, MD;  Location: [facility] CATH LAB;  Service: Cardiovascular;;  . CORONARY ANGIOPLASTY WITH STENT PLACEMENT  05/09/1998   "1"; LAD  . INSERT / REPLACE / REMOVE PACEMAKER  2004   initial placement; [employer]  . INSERT / REPLACE / REMOVE PACEMAKER  02/14/2011  . NASAL HEMORRHAGE CONTROL  1980's   Clips placed to stop bleeding  . SKIN CANCER EXCISION  09/18/11   "  have had a couple hundred of them removed since I was in my 64's"  . TEE WITHOUT CARDIOVERSION N/A 05/15/2015   Procedure: TRANSESOPHAGEAL ECHOCARDIOGRAM (TEE);  Surgeon: Jordan Blanks, MD;  Location: [place];  Service: Open Heart Surgery;  Laterality: N/A;  . TONSILLECTOMY     "when I was a teenager"  . TRANSCATHETER AORTIC VALVE REPLACEMENT, TRANSFEMORAL N/A 05/15/2015   Procedure: TRANSCATHETER AORTIC VALVE REPLACEMENT, TRANSFEMORAL;  Surgeon: Jordan Blanks, MD;  Location: [place];  Service: Open Heart Surgery;  Laterality: N/A;     Family History  Problem Relation Age of Onset  . Cancer Father        stomach  . Colon cancer Father   . Heart disease Brother   . Heart disease Brother   . Diabetes Other        granddaughter     Social History   Socioeconomic History  .  Marital status: Married    Spouse name: Not on file  . Number of children: 2  . Years of education: Not on file  . Highest education level: Not on file  Social Needs  . Financial resource strain: Not on file  . Food insecurity - worry: Not on file  . Food insecurity - inability: Not on file  . Transportation needs - medical: Not on file  . Transportation needs - non-medical: Not on file  Occupational History  . Occupation: Retired    Comment: Building control surveyor  Tobacco Use  . Smoking status: Former Smoker    Packs/day: 1.00    Years: 42.00    Pack years: 42.00    Types: Cigarettes    Last attempt to quit: 08/18/1974    Years since quitting: 43.0  . Smokeless tobacco: Former Systems developer    Types: Chew  Substance and Sexual Activity  . Alcohol use: No    Alcohol/week: 0.0 oz  . Drug use: No  . Sexual activity: Yes  Other Topics Concern  . Not on file  Social History Narrative  . Not on file     BP 130/90   Pulse 96   Ht 5\' 3"  (1.6 m)   Wt 167 lb 3.2 oz (75.8 kg)   SpO2 98%   BMI 29.62 kg/m   Physical Exam:  Well appearing 83 year old man, NAD HEENT: Unremarkable Neck: 7 cm JVD, no thyromegally Lymphatics:  No adenopathy Back:  No CVA tenderness Lungs:  Clear, with no wheezes, rales, or rhonchi. HEART:  Regular rate rhythm, no murmurs, no rubs, no clicks Abd:  soft, positive bowel sounds, no organomegally, no rebound, no guarding Ext:  2 plus pulses, no edema, no cyanosis, no clubbing Skin:  No rashes no nodules Neuro:  CN II through XII intact, motor grossly intact  DEVICE  Normal device function.  See PaceArt for details.   Assess/Plan: 1.  Complete heart block -he is asymptomatic status post pacemaker insertion 2.  Permanent pacemaker -interrogation of his Medtronic dual-chamber pacemaker demonstrates normal dual-chamber pacemaker function. 3.  Hypertension -his blood pressure is well controlled today.  No change in medical therapy. 4.  Coronary artery disease -he  remains active and denies anginal symptoms.  He will continue his current medical therapy.  Jordan Sickles, MD

## 2017-09-09 ENCOUNTER — Telehealth: Payer: Self-pay | Admitting: Cardiology

## 2017-09-09 NOTE — Telephone Encounter (Signed)
Returned call to patient.  Request to speak to Dr. Doug Sou nurse. Advised she is OOO this week but could relay a message or help if needed.  Patient request a paperwork filled out for handicap sticker.  Advised I would sent to nurse to do next week,  Patient aware and verbalized understanding

## 2017-09-09 NOTE — Telephone Encounter (Signed)
Mr.Merendino is returning a call . Thanks

## 2017-09-14 ENCOUNTER — Telehealth: Payer: Self-pay | Admitting: Cardiology

## 2017-09-14 NOTE — Telephone Encounter (Signed)
Returned call to patient no answer.Unable to leave message no voice mail. 

## 2017-09-14 NOTE — Telephone Encounter (Signed)
Follow up     Patient calling to see if paper work for handicap sticker has been filled out yet

## 2017-09-16 NOTE — Telephone Encounter (Signed)
Spoke to patient.He stated he already had PCP sign handicap parking pass.

## 2017-10-08 ENCOUNTER — Other Ambulatory Visit (HOSPITAL_COMMUNITY): Payer: Self-pay

## 2017-10-08 ENCOUNTER — Ambulatory Visit: Payer: Self-pay | Admitting: Family

## 2017-10-13 ENCOUNTER — Ambulatory Visit (HOSPITAL_COMMUNITY)
Admission: RE | Admit: 2017-10-13 | Discharge: 2017-10-13 | Disposition: A | Discharge status: Home / Self Care | Payer: Medicare Other | Source: Ambulatory Visit | Attending: Family | Admitting: Family

## 2017-10-13 ENCOUNTER — Encounter: Payer: Self-pay | Admitting: Family

## 2017-10-13 ENCOUNTER — Ambulatory Visit (INDEPENDENT_AMBULATORY_CARE_PROVIDER_SITE_OTHER): Payer: Medicare Other | Admitting: Family

## 2017-10-13 ENCOUNTER — Other Ambulatory Visit: Payer: Self-pay

## 2017-10-13 VITALS — BP 123/72 | HR 80 | Temp 97.2°F | Resp 18 | Ht 63.0 in | Wt 161.0 lb

## 2017-10-13 DIAGNOSIS — I714 Abdominal aortic aneurysm, without rupture, unspecified: Secondary | ICD-10-CM

## 2017-10-13 NOTE — Progress Notes (Signed)
[facility] [place]   CC: Follow up Abdominal Aortic Aneurysm  History of Present Illness  Jordan [name] is a 84 y.o. (May 07, 1918) male whom Dr. Kellie Simmering has been monitoring regarding his infrarenal abdominal aortic aneurysm. He returns today for follow up.  He had a CT scan in 2016 which revealed the aneurysm to be 4.7 cm in maximum diameter. It did appear to be a good candidate for stent grafting. He has been stable since that time.  He denies any new symptoms. He has had a previous transaortic valve replacement in August 2016 and did very well. He is quite active and plays golf frequently.  He drives and is very active for his age.  He denies any abdominal or back pain.   The patient denies claudication in his legs with walking. The patient denies any history of stroke or TIA symptoms.  Pt states that Dr. Allyson Sabal, dermatologist, is aware of the large multi hued macular lesion on the left side of his face; pt states this has been biopsied.   Pt Diabetic: No Pt smoker: former smoker, quit in 1972, smoked x 42 years   Past Medical History:  Diagnosis Date  . AAA (abdominal aortic aneurysm) ([place])   . Anemia   . Aortic stenosis   . Arthritis 09/18/11   "in my knees"  . Basal cell carcinoma of skin   . Blood transfusion   . BPH (benign prostatic hyperplasia)   . CAD (coronary artery disease)    Remote stent to LAD in 1999. Last cath in 2004 and he is managed medically  . Colon polyps   . Complication of anesthesia   . Dysrhythmia    paced  . GERD (gastroesophageal reflux disease)   . Hemorrhoid   . HTN (hypertension)   . Hyperlipidemia   . Lung nodule    RLL  . 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 Renal mass, left   . 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  CARDIAC CATHETERIZATION  2004   Managed medically  . CARDIAC CATHETERIZATION    . CARDIAC CATHETERIZATION N/A 04/11/2015   Procedure: Right/Left Heart Cath and Coronary Angiography;  Surgeon: Burnell Blanks, MD;  Location: [place] [facility];  Service: Cardiovascular;  Laterality: N/A;  . CORONARY ANGIOGRAM  09/19/2011   Procedure: CORONARY ANGIOGRAM;  Surgeon: Josue Hector, MD;  [facility] C[facility];  Service: Cardiovascular;;  . CORONARY ANGIOPLASTY WITH STENT PLACEMENT  05/09/1998   "1"; LAD  . INSERT / REPLACE / REMOVE PACEMAKER  2004   initial placement; [employer]  . INSERT / REPLACE / REMOVE PACEMAKER  02/14/2011  . NASAL HEMORRHAGE CONTROL  1980's   Clips placed to stop bleeding  . SKIN CANCER EXCISION  09/18/11   "have had a couple hundred of them removed since I was in my 40's"  . TEE WITHOUT CARDIOVERSION N/A 05/15/2015   Procedure: TRANSESOPHAGEAL ECHOCARDIOGRAM (TEE);  Surgeon: Burnell Blanks, MD;  Location: [place];  Service: Open Heart Surgery;  Laterality: N/A;  . TONSILLECTOMY     "when I was a teenager"  . TRANSCATHETER AORTIC VALVE REPLACEMENT, TRANSFEMORAL N/A 05/15/2015   Procedure: TRANSCATHETER AORTIC VALVE REPLACEMENT, TRANSFEMORAL;  Surgeon: Burnell Blanks, MD;  Location: [place];  Service: Open Heart Surgery;  Laterality:  N/A;   Social History Social History   Socioeconomic History  . Marital status: Married    Spouse name: Not on file  . Number of children: 2  . Years of education: Not on file  . Highest education level: Not on file  Social Needs  . Financial resource strain: Not on file  . Food insecurity - worry: Not on file  . Food insecurity - inability: Not on file  . Transportation needs - medical: Not on file  . Transportation needs - non-medical: Not on file  Occupational History  . Occupation: Retired    Comment: Building control surveyor  Tobacco Use  . Smoking status: Former Smoker    Packs/day: 1.00    Years: 42.00    Pack years: 42.00     Types: Cigarettes    Last attempt to quit: 08/18/1974    Years since quitting: 43.1  . Smokeless tobacco: Former Systems developer    Types: Chew  Substance and Sexual Activity  . Alcohol use: No    Alcohol/week: 0.0 oz  . Drug use: No  . Sexual activity: Yes  Other Topics Concern  . Not on file  Social History Narrative  . Not on file   Family History Family History  Problem Relation Age of Onset  . Cancer Father        stomach  . Colon cancer Father   . Heart disease Brother   . Heart disease Brother   . Diabetes Other        granddaughter    Current Outpatient Medications on File Prior to Visit  Medication Sig Dispense Refill  . acetaminophen (TYLENOL) 325 MG tablet Take 650 mg by mouth every 6 (six) hours as needed for mild pain, moderate pain or fever.     . clopidogrel (PLAVIX) 75 MG tablet TAKE 1 TABLET (75 MG TOTAL) BY MOUTH DAILY. 90 tablet 1  . esomeprazole (NEXIUM) 20 MG capsule Take 1 capsule (20 mg total) by mouth daily at 12 noon. 90 capsule 3  . ferrous sulfate 325 (65 FE) MG tablet Take 325 mg by mouth every evening.     . NORVASC 5 MG tablet TAKE 1 TABLET (5 MG TOTAL) BY MOUTH DAILY. 90 tablet 2  . rosuvastatin (CRESTOR) 5 MG tablet TAKE 1 TABLET (5 MG TOTAL) BY MOUTH DAILY. 90 tablet 1  . silodosin (RAPAFLO) 4 MG CAPS capsule Take 4 mg by mouth every evening.      No current facility-administered medications on file prior to visit.    Allergies  Allergen Reactions  . Aspirin Other (See Comments)    Stomach bleeds  . Nitroglycerin     Makes patient faint    ROS: See HPI for pertinent positives and negatives.  Physical Examination  Vitals:   10/13/17 0853  BP: 123/72  Pulse: 80  Resp: 18  Temp: (!) 97.2 F (36.2 C)  TempSrc: Oral  SpO2: 95%  Weight: 161 lb (73 kg)  Height: 5\' 3"  (1.6 m)   Body mass index is 28.52 kg/m.  General: A&O x 3, WD, elderly male who appears younger than his stated age. Gait: Normal Eyes: PERRLA, bilateral ectropion, left  more prominent than right  HENT: No apparent abnormalities  Pulmonary: Sym exp, respirations are non labored, good air movement in all fields, CTAB, no rales, rhonchi, or wheezing. Cardiac: Regular rhythm and rate, no detected  murmur. Pacemaker right upper chest.    Carotid Bruits Right Left   Negative Negative    Abdominal  aortic pulse is not palpable Radial pulses are palpable: 1+ right and 2+ left.                          VASCULAR EXAM:                                                                                                                                           LE Pulses Right Left       FEMORAL   palpable   palpable        POPLITEAL  not palpable   not palpable       POSTERIOR TIBIAL   palpable    palpable        DORSALIS PEDIS      ANTERIOR TIBIAL  palpable   palpable     Gastrointestinal: soft, NTND, -G/R, - HSM, - masses palpated, - CVAT B. Musculoskeletal: M/S 5/5 throughout, Extremities without ischemic changes. Skin: Large macular lesion left side of face with various hues of brown, irregular borders. Multiple actinic keratoses on scalp and forearms.  Neurologic: CN 2-12 intact except is slightly hard of hearing, Pain and light touch intact in extremities are intact, Motor exam as listed above. Psychiatric: Normal thought content, mood appropriate to clinical situation.    DATA  AAA Duplex (10/13/2017):  Previous size: 4.5 cm (Date: 01-06-17) Limited visualization of the abdominal vasculature due to overlying bowel gas. Right CIA: 1.88 cm, Left CIA not visualized.   Current size:  4.6 cm (Date: 10/13/17); Right CIA: 1.8 cm; Left CIA: 1.4 cm. Limited visualization due to bowel gas.  Medical Decision Making  The patient is a 84 y.o. male who presents with asymptomatic AAA with no increase in size based on limited visualization, 4.6 cm today, 4.5 cm on 01-06-17.    Based on this patient's exam and diagnostic studies, the patient will  follow up in 9 months with the following studies: AAA duplex.  Consideration for repair of AAA would be made when the size is 5.0 cm, growth > 1 cm/yr, and symptomatic status.        The patient was given information about AAA including signs, symptoms, treatment, and how to minimize the risk of enlargement and rupture of aneurysms.    I emphasized the importance of maximal medical management including strict control of blood pressure, blood glucose, and lipid levels, antiplatelet agents, obtaining regular exercise, and continued cessation of smoking.   The patient was advised to call 911 should the patient experience sudden onset abdominal or back pain.   Thank you for allowing [place] to participate in this patient's care.  Clemon Chambers, RN, MSN, FNP-C [facility] [place] Office: [phone]  Clinic Physician: Early  10/13/2017, 9:00 AM

## 2017-10-13 NOTE — Patient Instructions (Signed)
Before your next abdominal ultrasound:  Take two Extra-Strength Gas-X capsules at bedtime the night before the test. Take another two Extra-Strength Gas-X capsules 3 hours before the test.  Avoid gas forming foods the day before the test.       Abdominal Aortic Aneurysm Blood pumps away from the heart through tubes (blood vessels) called arteries. Aneurysms are weak or damaged places in the wall of an artery. It bulges out like a balloon. An abdominal aortic aneurysm happens in the main artery of the body (aorta). It can burst or tear, causing bleeding inside the body. This is an emergency. It needs treatment right away. What are the causes? The exact cause is unknown. Things that could cause this problem include:  Fat and other substances building up in the lining of a tube.  Swelling of the walls of a blood vessel.  Certain tissue diseases.  Belly (abdominal) trauma.  An infection in the main artery of the body.  What increases the risk? There are things that make it more likely for you to have an aneurysm. These include:  Being over the age of 82 years old.  Having high blood pressure (hypertension).  Being a male.  Being white.  Being very overweight (obese).  Having a family history of aneurysm.  Using tobacco products.  What are the signs or symptoms? Symptoms depend on the size of the aneurysm and how fast it grows. There may not be symptoms. If symptoms occur, they can include:  Pain (belly, side, lower back, or groin).  Feeling full after eating a small amount of food.  Feeling sick to your stomach (nauseous), throwing up (vomiting), or both.  Feeling a lump in your belly that feels like it is beating (pulsating).  Feeling like you will pass out (faint).  How is this treated?  Medicine to control blood pressure and pain.  Imaging tests to see if the aneurysm gets bigger.  Surgery. How is this prevented? To lessen your chance of getting this  condition:  Stop smoking. Stop chewing tobacco.  Limit or avoid alcohol.  Keep your blood pressure, blood sugar, and cholesterol within normal limits.  Eat less salt.  Eat foods low in saturated fats and cholesterol. These are found in animal and whole dairy products.  Eat more fiber. Fiber is found in whole grains, vegetables, and fruits.  Keep a healthy weight.  Stay active and exercise often.  This information is not intended to replace advice given to you by your health care provider. Make sure you discuss any questions you have with your health care provider. Document Released: 12/27/2012 Document Revised: 02/07/2016 Document Reviewed: 10/01/2012 Elsevier Interactive Patient Education  2017 Elsevier Inc.  

## 2017-10-14 NOTE — Addendum Note (Signed)
Addended by: Lianne Cure A on: 10/14/2017 12:22 PM   Modules accepted: Orders

## 2017-11-03 ENCOUNTER — Other Ambulatory Visit: Payer: Self-pay | Admitting: Cardiology

## 2017-11-03 NOTE — Telephone Encounter (Signed)
Rx(s) sent to pharmacy electronically.  

## 2017-11-18 ENCOUNTER — Ambulatory Visit (INDEPENDENT_AMBULATORY_CARE_PROVIDER_SITE_OTHER): Payer: Medicare Other | Admitting: *Deleted

## 2017-11-18 DIAGNOSIS — I441 Atrioventricular block, second degree: Secondary | ICD-10-CM | POA: Diagnosis not present

## 2017-11-18 NOTE — Progress Notes (Signed)
Remote pacemaker transmission.   

## 2017-11-19 ENCOUNTER — Encounter: Payer: Self-pay | Admitting: Cardiology

## 2017-11-22 LAB — CUP PACEART REMOTE DEVICE CHECK
Battery Impedance: 1517 Ohm
Battery Voltage: 2.74 V
Brady Statistic AP VS Percent: 0 %
Brady Statistic AS VS Percent: 0 %
Date Time Interrogation Session: 20190306151828
Implantable Lead Implant Date: 20040811
Implantable Lead Location: 753859
Implantable Lead Location: 753860
Implantable Lead Model: 4469
Lead Channel Impedance Value: 483 Ohm
Lead Channel Pacing Threshold Pulse Width: 0.4 ms
Lead Channel Pacing Threshold Pulse Width: 0.4 ms
Lead Channel Setting Pacing Amplitude: 2.5 V
Lead Channel Setting Pacing Pulse Width: 0.4 ms
Lead Channel Setting Sensing Sensitivity: 1 mV
MDC IDC LEAD IMPLANT DT: 20040811
MDC IDC LEAD SERIAL: 421267
MDC IDC LEAD SERIAL: 436022
MDC IDC MSMT BATTERY REMAINING LONGEVITY: 37 mo
MDC IDC MSMT LEADCHNL RA IMPEDANCE VALUE: 454 Ohm
MDC IDC MSMT LEADCHNL RA PACING THRESHOLD AMPLITUDE: 0.75 V
MDC IDC MSMT LEADCHNL RV PACING THRESHOLD AMPLITUDE: 0.875 V
MDC IDC PG IMPLANT DT: 20120601
MDC IDC SET LEADCHNL RA PACING AMPLITUDE: 2 V
MDC IDC STAT BRADY AP VP PERCENT: 20 %
MDC IDC STAT BRADY AS VP PERCENT: 80 %

## 2017-12-24 ENCOUNTER — Telehealth: Payer: Self-pay | Admitting: Cardiology

## 2017-12-24 NOTE — Telephone Encounter (Signed)
SPOKE TO PATIENT . PATIENT STATES HE ONLY WANT TO SPEAK T CHERYL. PATIENT AWARE MESSAGE WILL BE SENT TO HER.

## 2017-12-24 NOTE — Telephone Encounter (Signed)
New message   Patient states he is returning call  from 12/23/17 ,from nurse. Please call

## 2017-12-24 NOTE — Telephone Encounter (Signed)
Returned call to patient no answer.Unable to leave message no voice mail. 

## 2017-12-24 NOTE — Telephone Encounter (Signed)
Spoke to patient he stated both feet have been swollen for 1 week.No sob,does not weigh.Stated he takes care of his wife.He does not have any one to help him.Stated she has got worse he cannot leave her alone.Advised to eat low salt diet.Elevate feet as much as he can.Advised to wear compression socks.Stated he cannot wear them, they make his feet and legs hurt.Advised Dr.Jordan out of office this week.I will speak to him next week and call you back.

## 2017-12-29 ENCOUNTER — Ambulatory Visit: Payer: Self-pay | Admitting: Cardiology

## 2017-12-29 NOTE — Telephone Encounter (Signed)
Returned call to patient.Spoke to Jordan [name] he advised ok to take Lasix 10 mg daily.Stated since he has been keeping feet elevated swelling much better.Stated he wants to wait.He will call back if swelling becomes worse.Advised to keep appointment as planned.

## 2018-01-13 ENCOUNTER — Encounter: Payer: Self-pay | Admitting: Cardiology

## 2018-02-01 NOTE — Progress Notes (Signed)
Jordan [name] Date of Birth: [birth year]   History of Present Illness: Jordan [name] is seen today for follow up CAD, pacemaker and AVR. He is 84 years old. He has a history of coronary disease with remote stenting of the LAD in 1999. Cardiac catheterization January 2013 showed nonobstructive disease. He has a history of bradycardia requiring pacemaker implant. He has a history of severe aortic stenosis. He underwent TAVR on 05/15/15.  Follow up Echo in September 2017 looked good. Pacemaker follow up in February 2018 was satisfactory.  He has a 4.6 cm abdominal aortic aneurysm followed  by [facility]- last checked in Jan 2019. Not a candidate for stent grafting. He was switched from Nexium to Protonix due to possible interaction with Plavix but protonix made him feel bad so he went back to nexium. He called in April noting increased swelling in his feet. Improved when he could keep them elevated and reduced salt intake.   On followup today he is feeling  well. He denies any chest pain. Edema has resolved. Does note some SOB when doing yard work at age 84. No palpitations or dizziness.   Current Outpatient Medications on File Prior to Visit  Medication Sig Dispense Refill  . acetaminophen (TYLENOL) 325 MG tablet Take 650 mg by mouth every 6 (six) hours as needed for mild pain, moderate pain or fever.     . clopidogrel (PLAVIX) 75 MG tablet TAKE 1 TABLET BY MOUTH EVERY DAY 90 tablet 2  . esomeprazole (NEXIUM) 20 MG capsule Take 1 capsule (20 mg total) by mouth daily at 12 noon. 90 capsule 3  . ferrous sulfate 325 (65 FE) MG tablet Take 325 mg by mouth every evening.     . NORVASC 5 MG tablet TAKE 1 TABLET (5 MG TOTAL) BY MOUTH DAILY. 90 tablet 2  . rosuvastatin (CRESTOR) 5 MG tablet TAKE 1 TABLET BY MOUTH EVERY DAY 90 tablet 2  . silodosin (RAPAFLO) 4 MG CAPS capsule Take 4 mg by mouth every evening.      No current facility-administered medications on file prior to visit.     Allergies  Allergen  Reactions  . Aspirin Other (See Comments)    Stomach bleeds  . Nitroglycerin     Makes patient faint    Past Medical History:  Diagnosis Date  . AAA (abdominal aortic aneurysm) ([place])   . Anemia   . Aortic stenosis   . Arthritis 09/18/11   "in my knees"  . Basal cell carcinoma of skin   . Blood transfusion   . BPH (benign prostatic hyperplasia)   . CAD (coronary artery disease)    Remote stent to LAD in 1999. Last cath in 2004 and he is managed medically  . Colon polyps   . Complication of anesthesia   . Dysrhythmia    paced  . GERD (gastroesophageal reflux disease)   . Hemorrhoid   . HTN (hypertension)   . Hyperlipidemia   . Lung nodule    RLL  . 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 Renal mass, left   . 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 CARDIAC CATHETERIZATION  2004   Managed medically  . CARDIAC CATHETERIZATION    . CARDIAC CATHETERIZATION N/A 04/11/2015   Procedure: Right/Left Heart Cath and Coronary Angiography;  Surgeon: Burnell Blanks, MD;  Location: [facility];  Service: Cardiovascular;  Laterality: N/A;  . CORONARY ANGIOGRAM  09/19/2011   Procedure: CORONARY ANGIOGRAM;  Surgeon: Josue Hector, MD;  Location: [facility] C[facility];  Service: Cardiovascular;;  . CORONARY ANGIOPLASTY WITH STENT PLACEMENT  05/09/1998   "1"; LAD  . INSERT / REPLACE / REMOVE PACEMAKER  2004   initial placement; [employer]  . INSERT / REPLACE / REMOVE PACEMAKER  02/14/2011  . NASAL HEMORRHAGE CONTROL  1980's   Clips placed to stop bleeding  . SKIN CANCER EXCISION  09/18/11   "have had a couple hundred of them removed since I was in my 40's"  . TEE WITHOUT CARDIOVERSION N/A 05/15/2015   Procedure: TRANSESOPHAGEAL ECHOCARDIOGRAM (TEE);  Surgeon: Burnell Blanks, MD;  Location: [facility];   Service: Open Heart Surgery;  Laterality: N/A;  . TONSILLECTOMY     "when I was a teenager"  . TRANSCATHETER AORTIC VALVE REPLACEMENT, TRANSFEMORAL N/A 05/15/2015   Procedure: TRANSCATHETER AORTIC VALVE REPLACEMENT, TRANSFEMORAL;  Surgeon: Burnell Blanks, MD;  Location: [facility];  Service: Open Heart Surgery;  Laterality: N/A;    Social History   Tobacco Use  Smoking Status Former Smoker  . Packs/day: 1.00  . Years: 42.00  . Pack years: 42.00  . Types: Cigarettes  . Last attempt to quit: 08/18/1974  . Years since quitting: 43.4  Smokeless Tobacco Former Systems developer  . Types: Chew    Social History   Substance and Sexual Activity  Alcohol Use No  . Alcohol/week: 0.0 oz    Family History  Problem Relation Age of Onset  . Cancer Father        stomach  . Colon cancer Father   . Heart disease Brother   . Heart disease Brother   . Diabetes Other        granddaughter    Review of Systems: As noted in history of present illness. All other systems were reviewed and are negative.  Physical Exam: BP (!) 150/76   Pulse 72   Ht 5\' 3"  (1.6 m)   Wt 161 lb 12.8 oz (73.4 kg)   BMI 28.66 kg/m  GENERAL:  Well appearing, elderly WM in NAD HEENT:  PERRL, EOMI, sclera are clear. Oropharynx is clear. NECK:  No jugular venous distention, carotid upstroke brisk and symmetric, no bruits, no thyromegaly or adenopathy LUNGS:  Clear to auscultation bilaterally CHEST:  Unremarkable HEART:  RRR,  PMI not displaced or sustained,S1 and S2 within normal limits, no S3, no S4: no clicks, no rubs, gr 2/6 systolic murmur RUSB.  ABD:  Soft, nontender. BS +, no masses or bruits. No hepatomegaly, no splenomegaly EXT:  2 + pulses throughout, no edema, no cyanosis no clubbing SKIN:  Warm and dry.  No rashes NEURO:  Alert and oriented x 3. Cranial nerves II through XII intact. PSYCH:  Cognitively intact   LABORATORY DATA: Lab Results  Component Value Date   WBC 8.7 06/22/2015   HGB 10.8 (L)  06/22/2015   HCT 32.0 (L) 06/22/2015   PLT 179 06/22/2015   GLUCOSE 105 (H) 05/17/2015   CHOL 104 09/19/2011   TRIG 66 09/19/2011   HDL 38 (L) 09/19/2011   LDLCALC 53 09/19/2011   ALT 22 05/11/2015   AST 26 05/11/2015   NA 137 05/17/2015   K 4.1 05/17/2015   CL 107 05/17/2015   CREATININE 1.11 05/17/2015   BUN 47 (H) 05/17/2015   CO2 27 05/17/2015  TSH 0.797 09/18/2011   INR 1.45 05/15/2015   HGBA1C 5.5 05/11/2015    Echo: 05/28/16:  Study Conclusions  - Left ventricle: The cavity size was mildly dilated. Wall   thickness was increased in a pattern of moderate LVH. Systolic   function was normal. The estimated ejection fraction was in the   range of 60% to 65%. Doppler parameters are consistent with   abnormal left ventricular relaxation (grade 1 diastolic   dysfunction). - Aortic valve: 26 mm Sapien 3 valve in excellent position with no   perivalvular regurgitation and stable gradients. - Mitral valve: Calcified annulus. Moderately thickened leaflets .   There was mild regurgitation. Valve area by pressure half-time:   2.5 cm^2. - Left atrium: The atrium was moderately dilated. - Atrial septum: No defect or patent foramen ovale was identified.  Labs reviewed from 07/15/16: normal CMET. Hgb 12. Cholesterol 130, trig-168, HDL 37, LDL 59.   Assessment / Plan: 1. Coronary disease with remote stenting of the LAD in 1999. Cardiac catheterization Jan 2013 showed nonobstructive disease. He  aissymptomatic. We will continue with Plavix and statin therapy.  2. Severe aortic stenosis.s/p TAVR in August 2016. Excellent result. Follow up Echo in September 2017 looked good. Exam is good.   3. Sinus node dysfunction with bradycardia. Status post pacemaker implant. Pacemaker followup in March was satisfactory. Follow up in pacer clinic.  4. Hyperlipidemia. On chronic Crestor therapy. Labs followed by primary care. Patient reports lab work last October but I am unable to find  results today.  5. AAA 4.7 cm. Followed by [facility]. Not a candidate for stent grafting. He is a poor candidate for open repair. Last [place] on 10/13/17 was stable.   6. Renal mass. Followed by urology.  I will follow up in 6 months.

## 2018-02-03 ENCOUNTER — Encounter: Payer: Self-pay | Admitting: Cardiology

## 2018-02-03 ENCOUNTER — Ambulatory Visit: Payer: Medicare Other | Admitting: Cardiology

## 2018-02-03 VITALS — BP 150/76 | HR 72 | Ht 63.0 in | Wt 161.8 lb

## 2018-02-03 DIAGNOSIS — Z952 Presence of prosthetic heart valve: Secondary | ICD-10-CM

## 2018-02-03 DIAGNOSIS — I714 Abdominal aortic aneurysm, without rupture, unspecified: Secondary | ICD-10-CM

## 2018-02-03 DIAGNOSIS — I350 Nonrheumatic aortic (valve) stenosis: Secondary | ICD-10-CM

## 2018-02-03 DIAGNOSIS — I2510 Atherosclerotic heart disease of native coronary artery without angina pectoris: Secondary | ICD-10-CM | POA: Diagnosis not present

## 2018-02-03 NOTE — Patient Instructions (Signed)
Continue your current therapy and stay away from salt.  I will see you in 6 months.

## 2018-02-12 ENCOUNTER — Telehealth: Payer: Self-pay | Admitting: Cardiology

## 2018-02-12 NOTE — Telephone Encounter (Signed)
Okay to dispense Nexium and clopidogrel

## 2018-02-12 NOTE — Telephone Encounter (Signed)
Returned call to Tammy-notified that its ok to dispense d/t interaction

## 2018-02-12 NOTE — Telephone Encounter (Signed)
[facility] is calling with a drug interaction for   Esomeprazole 20mg  and  Clopidogrel 75mg ..  Please advise. You can speak to any pharmacist at [facility]

## 2018-02-17 ENCOUNTER — Telehealth: Payer: Self-pay | Admitting: Cardiology

## 2018-02-17 ENCOUNTER — Encounter: Payer: Medicare Other | Admitting: *Deleted

## 2018-02-17 NOTE — Telephone Encounter (Signed)
Confirmed remote transmission w/ pt wife.   

## 2018-02-18 ENCOUNTER — Encounter: Payer: Self-pay | Admitting: Cardiology

## 2018-02-24 ENCOUNTER — Ambulatory Visit (INDEPENDENT_AMBULATORY_CARE_PROVIDER_SITE_OTHER): Payer: Medicare Other | Admitting: *Deleted

## 2018-02-24 DIAGNOSIS — I441 Atrioventricular block, second degree: Secondary | ICD-10-CM

## 2018-02-25 ENCOUNTER — Encounter: Payer: Self-pay | Admitting: Cardiology

## 2018-02-25 NOTE — Progress Notes (Signed)
Remote pacemaker transmission.   

## 2018-03-02 LAB — CUP PACEART REMOTE DEVICE CHECK
Battery Voltage: 2.74 V
Brady Statistic AP VS Percent: 0 %
Brady Statistic AS VP Percent: 83 %
Date Time Interrogation Session: 20190612164254
Implantable Lead Implant Date: 20040811
Implantable Lead Implant Date: 20040811
Implantable Lead Location: 753859
Implantable Lead Location: 753860
Implantable Lead Serial Number: 436022
Lead Channel Pacing Threshold Amplitude: 0.75 V
Lead Channel Pacing Threshold Amplitude: 0.875 V
Lead Channel Pacing Threshold Pulse Width: 0.4 ms
Lead Channel Pacing Threshold Pulse Width: 0.4 ms
Lead Channel Setting Pacing Amplitude: 2.5 V
Lead Channel Setting Pacing Pulse Width: 0.4 ms
Lead Channel Setting Sensing Sensitivity: 1 mV
MDC IDC LEAD SERIAL: 421267
MDC IDC MSMT BATTERY IMPEDANCE: 1689 Ohm
MDC IDC MSMT BATTERY REMAINING LONGEVITY: 34 mo
MDC IDC MSMT LEADCHNL RA IMPEDANCE VALUE: 460 Ohm
MDC IDC MSMT LEADCHNL RA SENSING INTR AMPL: 0.7 mV
MDC IDC MSMT LEADCHNL RV IMPEDANCE VALUE: 478 Ohm
MDC IDC PG IMPLANT DT: 20120601
MDC IDC SET LEADCHNL RA PACING AMPLITUDE: 2 V
MDC IDC STAT BRADY AP VP PERCENT: 17 %
MDC IDC STAT BRADY AS VS PERCENT: 0 %

## 2018-05-26 ENCOUNTER — Encounter: Payer: Medicare Other | Admitting: *Deleted

## 2018-05-26 ENCOUNTER — Telehealth: Payer: Self-pay

## 2018-05-26 NOTE — Telephone Encounter (Signed)
Spoke with pt and reminded pt of remote transmission that is due today. Pt verbalized understanding.   

## 2018-05-27 ENCOUNTER — Encounter: Payer: Self-pay | Admitting: Cardiology

## 2018-06-07 ENCOUNTER — Ambulatory Visit (INDEPENDENT_AMBULATORY_CARE_PROVIDER_SITE_OTHER): Payer: Medicare Other | Admitting: *Deleted

## 2018-06-07 DIAGNOSIS — I441 Atrioventricular block, second degree: Secondary | ICD-10-CM | POA: Diagnosis not present

## 2018-06-07 NOTE — Progress Notes (Signed)
Remote pacemaker transmission.   

## 2018-06-08 ENCOUNTER — Encounter: Payer: Self-pay | Admitting: Cardiology

## 2018-06-20 LAB — CUP PACEART REMOTE DEVICE CHECK
Battery Impedance: 1775 Ohm
Battery Remaining Longevity: 32 mo
Battery Voltage: 2.73 V
Brady Statistic AP VP Percent: 17 %
Brady Statistic AS VP Percent: 83 %
Brady Statistic AS VS Percent: 0 %
Implantable Lead Implant Date: 20040811
Implantable Lead Location: 753859
Implantable Lead Model: 4469
Implantable Lead Model: 4470
Implantable Lead Serial Number: 421267
Implantable Pulse Generator Implant Date: 20120601
Lead Channel Impedance Value: 461 Ohm
Lead Channel Impedance Value: 476 Ohm
Lead Channel Pacing Threshold Amplitude: 0.75 V
Lead Channel Pacing Threshold Amplitude: 0.875 V
Lead Channel Setting Pacing Amplitude: 2 V
Lead Channel Setting Pacing Amplitude: 2.5 V
MDC IDC LEAD IMPLANT DT: 20040811
MDC IDC LEAD LOCATION: 753860
MDC IDC LEAD SERIAL: 436022
MDC IDC MSMT LEADCHNL RA PACING THRESHOLD PULSEWIDTH: 0.4 ms
MDC IDC MSMT LEADCHNL RV PACING THRESHOLD PULSEWIDTH: 0.4 ms
MDC IDC SESS DTM: 20190921173223
MDC IDC SET LEADCHNL RV PACING PULSEWIDTH: 0.4 ms
MDC IDC SET LEADCHNL RV SENSING SENSITIVITY: 1 mV
MDC IDC STAT BRADY AP VS PERCENT: 0 %

## 2018-07-13 ENCOUNTER — Ambulatory Visit (HOSPITAL_COMMUNITY)
Admission: RE | Admit: 2018-07-13 | Discharge: 2018-07-13 | Disposition: A | Discharge status: Home / Self Care | Payer: Medicare Other | Source: Ambulatory Visit | Attending: Family | Admitting: Family

## 2018-07-13 ENCOUNTER — Encounter: Payer: Self-pay | Admitting: Family

## 2018-07-13 ENCOUNTER — Ambulatory Visit (INDEPENDENT_AMBULATORY_CARE_PROVIDER_SITE_OTHER): Payer: Medicare Other | Admitting: Family

## 2018-07-13 ENCOUNTER — Other Ambulatory Visit: Payer: Self-pay

## 2018-07-13 VITALS — BP 147/70 | HR 65 | Temp 97.0°F | Resp 16 | Ht 63.0 in | Wt 151.0 lb

## 2018-07-13 DIAGNOSIS — I714 Abdominal aortic aneurysm, without rupture, unspecified: Secondary | ICD-10-CM

## 2018-07-13 NOTE — Progress Notes (Signed)
[facility] [place]   CC: Follow up Abdominal Aortic Aneurysm  History of Present Illness  Jordan [name] is a 84 y.o. ([birth year]) male whomDr. Iran Planas been monitoringregarding his infrarenal abdominal aortic aneurysm. He returns today for follow up. He had a CT scanin 2016which revealed the aneurysm to be 4.7 cm in maximum diameter. It did appear to be a good candidate for stent grafting. He has been stable since that time.  He denies any new symptoms. He has had a previous transaortic valve replacement in August 2016 and did very well. He is quite active and plays golf frequently. He drives and is very active for his age.  He denies any abdominal or back pain.  The patientdeniesclaudication in hislegs with walking. The patientdenies anyhistory of stroke or TIA symptoms.  Pt states that Dr. Allyson Sabal, dermatologist, has been addressing his multiple AK's and skin cancers.   Pt Diabetic:No Pt smoker:former smoker, quitin 1972, smoked x 42 years   Past Medical History:  Diagnosis Date  . AAA (abdominal aortic aneurysm) ([place])   . Anemia   . Aortic stenosis   . Arthritis 09/18/11   "in my knees"  . Basal cell carcinoma of skin   . Blood transfusion   . BPH (benign prostatic hyperplasia)   . CAD (coronary artery disease)    Remote stent to LAD in 1999. Last cath in 2004 and he is managed medically  . Colon polyps   . Complication of anesthesia   . Dysrhythmia    paced  . GERD (gastroesophageal reflux disease)   . Hemorrhoid   . HTN (hypertension)   . Hyperlipidemia   . Lung nodule    RLL  . 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 Renal mass, left   . 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 CARDIAC CATHETERIZATION  2004   Managed medically  .  CARDIAC CATHETERIZATION    . CARDIAC CATHETERIZATION N/A 04/11/2015   Procedure: Right/Left Heart Cath and Coronary Angiography;  Surgeon: Burnell Blanks, MD;  Location: [facility];  Service: Cardiovascular;  Laterality: N/A;  . CORONARY ANGIOGRAM  09/19/2011   Procedure: CORONARY ANGIOGRAM;  Surgeon: Josue Hector, MD;  Location: [facility] C[facility];  Service: Cardiovascular;;  . CORONARY ANGIOPLASTY WITH STENT PLACEMENT  05/09/1998   "1"; LAD  . INSERT / REPLACE / REMOVE PACEMAKER  2004   initial placement; [employer]  . INSERT / REPLACE / REMOVE PACEMAKER  02/14/2011  . NASAL HEMORRHAGE CONTROL  1980's   Clips placed to stop bleeding  . SKIN CANCER EXCISION  09/18/11   "have had a couple hundred of them removed since I was in my 40's"  . TEE WITHOUT CARDIOVERSION N/A 05/15/2015   Procedure: TRANSESOPHAGEAL ECHOCARDIOGRAM (TEE);  Surgeon: Burnell Blanks, MD;  Location: [facility];  Service: Open Heart Surgery;  Laterality: N/A;  . TONSILLECTOMY     "when I was a teenager"  . TRANSCATHETER AORTIC VALVE REPLACEMENT, TRANSFEMORAL N/A 05/15/2015   Procedure: TRANSCATHETER AORTIC VALVE REPLACEMENT, TRANSFEMORAL;  Surgeon: Burnell Blanks, MD;  Location: [facility];  Service: Open Heart Surgery;  Laterality: N/A;   Social History Social History   Socioeconomic History  . Marital status: Married    Spouse name: Not on file  . Number of children:  2  . Years of education: Not on file  . Highest education level: Not on file  Occupational History  . Occupation: Retired    Comment: Building control surveyor  Social Needs  . Financial resource strain: Not on file  . Food insecurity:    Worry: Not on file    Inability: Not on file  . Transportation needs:    Medical: Not on file    Non-medical: Not on file  Tobacco Use  . Smoking status: Former Smoker    Packs/day: 1.00    Years: 42.00    Pack years: 42.00    Types: Cigarettes    Last attempt to quit: 08/18/1974    Years since quitting: 43.9  .  Smokeless tobacco: Former Systems developer    Types: Chew  Substance and Sexual Activity  . Alcohol use: No    Alcohol/week: 0.0 standard drinks  . Drug use: No  . Sexual activity: Yes  Lifestyle  . Physical activity:    Days per week: Not on file    Minutes per session: Not on file  . Stress: Not on file  Relationships  . Social connections:    Talks on phone: Not on file    Gets together: Not on file    Attends religious service: Not on file    Active member of club or organization: Not on file    Attends meetings of clubs or organizations: Not on file    Relationship status: Not on file  . Intimate partner violence:    Fear of current or ex partner: Not on file    Emotionally abused: Not on file    Physically abused: Not on file    Forced sexual activity: Not on file  Other Topics Concern  . Not on file  Social History Narrative  . Not on file   Family History Family History  Problem Relation Age of Onset  . Cancer Father        stomach  . Colon cancer Father   . Heart disease Brother   . Heart disease Brother   . Diabetes Other        granddaughter    Current Outpatient Medications on File Prior to Visit  Medication Sig Dispense Refill  . acetaminophen (TYLENOL) 325 MG tablet Take 650 mg by mouth every 6 (six) hours as needed for mild pain, moderate pain or fever.     . clopidogrel (PLAVIX) 75 MG tablet TAKE 1 TABLET BY MOUTH EVERY DAY 90 tablet 2  . esomeprazole (NEXIUM) 20 MG capsule Take 1 capsule (20 mg total) by mouth daily at 12 noon. 90 capsule 3  . ferrous sulfate 325 (65 FE) MG tablet Take 325 mg by mouth every evening.     . NORVASC 5 MG tablet TAKE 1 TABLET (5 MG TOTAL) BY MOUTH DAILY. 90 tablet 2  . rosuvastatin (CRESTOR) 5 MG tablet TAKE 1 TABLET BY MOUTH EVERY DAY 90 tablet 2  . silodosin (RAPAFLO) 4 MG CAPS capsule Take 4 mg by mouth every evening.      No current facility-administered medications on file prior to visit.    Allergies  Allergen Reactions   . Aspirin Other (See Comments)    Stomach bleeds  . Nitroglycerin     Makes patient faint    ROS: See HPI for pertinent positives and negatives.  Physical Examination  Vitals:   07/13/18 0855  BP: (!) 147/70  Pulse: 65  Resp: 16  Temp: (!) 97 F (36.1 C)  TempSrc: Oral  SpO2: 97%  Weight: 151 lb (68.5 kg)  Height: 5\' 3"  (1.6 m)   Body mass index is 26.75 kg/m.  General: A&O x 3, WD,elderly male who appears younger than his stated age. Gait: Normal Eyes: PERRLA, bilateral ectropion, left more prominent than right  HENT: No apparent abnormalities  Pulmonary: Sym exp, respirations are non labored, good air movement in all fields, CTAB, no rales, rhonchi, or wheezing. Cardiac: Regular rhythm and rate, nodetectedmurmur. Pacemaker right upper chest.    Carotid Bruits Right Left   Negative Negative   Abdominal aortic pulse is notpalpable Radial pulses arepalpable: 1+ right and 2+ left.  VASCULAR EXAM:  LE Pulses Right Left  FEMORAL palpable palpable   POPLITEAL 2+palpable  2+palpable  POSTERIOR TIBIAL not palpable  not palpable   DORSALIS PEDIS ANTERIOR TIBIAL 3+palpable  2+palpable     Gastrointestinal: soft, NTND, -G/R, - HSM, - masses palpated, - CVAT B. Musculoskeletal: M/S 5/5 throughout, Extremities without ischemic changes. Pitting ankle edema: 1+ right, 2+ left.  Skin: Multiple actinic keratoses on scalp and forearms.  Neurologic: CN 2-12 intact exceptis slightly hard of hearing, Pain and light touch intact in extremities are intact, Motor exam as listed above. Psychiatric: Normal thought content, mood appropriate to clinical situation.     DATA  AAA Duplex (07/13/2018):  Previous size: 4.6 cm (Date: 10/13/17); Right CIA: 1.8 cm; Left CIA: 1.4 cm. Limited visualization due to bowel gas.   Current size:  4.7 cm (Date: 07-13-18); Right CIA: 1.7 cm; Left CIA:  2.0 cm. Visualization of the right CIA was limited  Medical Decision Making  The patient is a 84 y.o. male who presents with asymptomatic AAA with 1 mm increase in size, to 4.7 cm today compared to 4.6 cm nine months prior.    Based on this patient's exam and diagnostic studies, the patient will follow up in 9 months with the following studies: AAA duplex.  Consideration for repair of AAA would be made when the size is 5.0 cm, growth > 1 cm/yr, and symptomatic status.        The patient was given information about AAA including signs, symptoms, treatment, and how to minimize the risk of enlargement and rupture of aneurysms.    I emphasized the importance of maximal medical management including strict control of blood pressure, blood glucose, and lipid levels, antiplatelet agents, obtaining regular exercise, and continued cessation of smoking.   The patient was advised to call 911 should the patient experience sudden onset abdominal or back pain.   Thank you for allowing [place] to participate in this patient's care.  Clemon Chambers, RN, MSN, FNP-C [facility] [place] [place] Office: [phone]  Clinic Physician: Charlesetta Ivory  07/13/2018, 8:59 AM

## 2018-07-13 NOTE — Patient Instructions (Signed)
Before your next abdominal ultrasound:  Avoid gas forming foods and beverages the day before the test.   Take two Extra-Strength Gas-X capsules at bedtime the night before the test. Take another two Extra-Strength Gas-X capsules in the middle of the night if you get up to the restroom, if not, first thing in the morning with water.  Do not chew gum.      Abdominal Aortic Aneurysm Blood pumps away from the heart through tubes (blood vessels) called arteries. Aneurysms are weak or damaged places in the wall of an artery. It bulges out like a balloon. An abdominal aortic aneurysm happens in the main artery of the body (aorta). It can burst or tear, causing bleeding inside the body. This is an emergency. It needs treatment right away. What are the causes? The exact cause is unknown. Things that could cause this problem include:  Fat and other substances building up in the lining of a tube.  Swelling of the walls of a blood vessel.  Certain tissue diseases.  Belly (abdominal) trauma.  An infection in the main artery of the body.  What increases the risk? There are things that make it more likely for you to have an aneurysm. These include:  Being over the age of 82 years old.  Having high blood pressure (hypertension).  Being a male.  Being white.  Being very overweight (obese).  Having a family history of aneurysm.  Using tobacco products.  What are the signs or symptoms? Symptoms depend on the size of the aneurysm and how fast it grows. There may not be symptoms. If symptoms occur, they can include:  Pain (belly, side, lower back, or groin).  Feeling full after eating a small amount of food.  Feeling sick to your stomach (nauseous), throwing up (vomiting), or both.  Feeling a lump in your belly that feels like it is beating (pulsating).  Feeling like you will pass out (faint).  How is this treated?  Medicine to control blood pressure and pain.  Imaging tests to  see if the aneurysm gets bigger.  Surgery. How is this prevented? To lessen your chance of getting this condition:  Stop smoking. Stop chewing tobacco.  Limit or avoid alcohol.  Keep your blood pressure, blood sugar, and cholesterol within normal limits.  Eat less salt.  Eat foods low in saturated fats and cholesterol. These are found in animal and whole dairy products.  Eat more fiber. Fiber is found in whole grains, vegetables, and fruits.  Keep a healthy weight.  Stay active and exercise often.  This information is not intended to replace advice given to you by your health care provider. Make sure you discuss any questions you have with your health care provider. Document Released: 12/27/2012 Document Revised: 02/07/2016 Document Reviewed: 10/01/2012 Elsevier Interactive Patient Education  2017 [employer] Inc.  

## 2018-07-22 ENCOUNTER — Other Ambulatory Visit: Payer: Self-pay

## 2018-07-22 MED ORDER — ATORVASTATIN CALCIUM 10 MG PO TABS: 10.0000 mg | ORAL_TABLET | Freq: Every day | ORAL | 3 refills | Status: DC

## 2018-07-23 ENCOUNTER — Telehealth: Payer: Self-pay

## 2018-07-23 NOTE — Telephone Encounter (Signed)
Spoke to patient we received a fax from your pharmacy [facility] stating crestor too expensive.Dr.Jordan prescribed atorvastatin 10 mg daily.Advised finish taking crestor and start atorvastatin 10 mg daily.

## 2018-07-25 ENCOUNTER — Other Ambulatory Visit: Payer: Self-pay | Admitting: Cardiology

## 2018-07-30 NOTE — Progress Notes (Signed)
Jordan [name] Date of Birth: [birth year]   History of Present Illness: Jordan [name] is seen today for follow up CAD, pacemaker and AVR. He just turned 84 years old yesterday. He has a history of coronary disease with remote stenting of the LAD in 1999. Cardiac catheterization January 2013 showed nonobstructive disease. He has a history of bradycardia requiring pacemaker implant. He has a history of severe aortic stenosis. He underwent TAVR on 05/15/15.  Follow up Echo in September 2017 looked good. Pacemaker follow up in February 2018 was satisfactory.  He has a 4.6 cm abdominal aortic aneurysm followed  by [facility]- last checked in October 2019. Stable at 4.7 cm.  Not a candidate for stent grafting. He was switched from Nexium to Protonix due to possible interaction with Plavix but protonix made him feel bad so he went back to nexium.    On followup today he is feeling  well. He denies any chest pain, edema, or increased SOB. He is mainly active caring for his wife who is progressively disabled.  No palpitations or dizziness. Reports he saw Urology and renal mass was smaller.    Current Outpatient Medications on File Prior to Visit  Medication Sig Dispense Refill  . acetaminophen (TYLENOL) 325 MG tablet Take 650 mg by mouth every 6 (six) hours as needed for mild pain, moderate pain or fever.     Marland Kitchen atorvastatin (LIPITOR) 10 MG tablet Take 1 tablet (10 mg total) by mouth daily. 90 tablet 3  . clopidogrel (PLAVIX) 75 MG tablet TAKE 1 TABLET BY MOUTH EVERY DAY 90 tablet 0  . esomeprazole (NEXIUM) 20 MG capsule Take 1 capsule (20 mg total) by mouth daily at 12 noon. 90 capsule 3  . ferrous sulfate 325 (65 FE) MG tablet Take 325 mg by mouth every evening.     . NORVASC 5 MG tablet TAKE 1 TABLET (5 MG TOTAL) BY MOUTH DAILY. 90 tablet 2  . silodosin (RAPAFLO) 4 MG CAPS capsule Take 4 mg by mouth every evening.      No current facility-administered medications on file prior to visit.     Allergies   Allergen Reactions  . Aspirin Other (See Comments)    Stomach bleeds  . Nitroglycerin     Makes patient faint    Past Medical History:  Diagnosis Date  . AAA (abdominal aortic aneurysm) ([place])   . Anemia   . Aortic stenosis   . Arthritis 09/18/11   "in my knees"  . Basal cell carcinoma of skin   . Blood transfusion   . BPH (benign prostatic hyperplasia)   . CAD (coronary artery disease)    Remote stent to LAD in 1999. Last cath in 2004 and he is managed medically  . Colon polyps   . Complication of anesthesia   . Dysrhythmia    paced  . GERD (gastroesophageal reflux disease)   . Hemorrhoid   . HTN (hypertension)   . Hyperlipidemia   . Lung nodule    RLL  . 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 Renal mass, left   . 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 CARDIAC CATHETERIZATION  2004   Managed medically  . CARDIAC CATHETERIZATION    . CARDIAC CATHETERIZATION N/A 04/11/2015   Procedure: Right/Left Heart Cath and Coronary  Angiography;  Surgeon: Burnell Blanks, MD;  Location: [place] [facility];  Service: Cardiovascular;  Laterality: N/A;  . CORONARY ANGIOGRAM  09/19/2011   Procedure: CORONARY ANGIOGRAM;  Surgeon: Josue Hector, MD;  Location: [facility];  Service: Cardiovascular;;  . CORONARY ANGIOPLASTY WITH STENT PLACEMENT  05/09/1998   "1"; LAD  . INSERT / REPLACE / REMOVE PACEMAKER  2004   initial placement; Medtronic  . INSERT / REPLACE / REMOVE PACEMAKER  02/14/2011  . NASAL HEMORRHAGE CONTROL  1980's   Clips placed to stop bleeding  . SKIN CANCER EXCISION  09/18/11   "have had a couple hundred of them removed since I was in my 40's"  . TEE WITHOUT CARDIOVERSION N/A 05/15/2015   Procedure: TRANSESOPHAGEAL ECHOCARDIOGRAM (TEE);  Surgeon: Burnell Blanks, MD;  Location: [place];  Service: Open Heart Surgery;  Laterality: N/A;  . TONSILLECTOMY     "when I was a teenager"  . TRANSCATHETER AORTIC VALVE REPLACEMENT, TRANSFEMORAL N/A 05/15/2015   Procedure: TRANSCATHETER AORTIC VALVE REPLACEMENT, TRANSFEMORAL;  Surgeon: Burnell Blanks, MD;  Location: [place];  Service: Open Heart Surgery;  Laterality: N/A;    Social History   Tobacco Use  Smoking Status Former Smoker  . Packs/day: 1.00  . Years: 42.00  . Pack years: 42.00  . Types: Cigarettes  . Last attempt to quit: 08/18/1974  . Years since quitting: 43.9  Smokeless Tobacco Former Systems developer  . Types: Chew    Social History   Substance and Sexual Activity  Alcohol Use No  . Alcohol/week: 0.0 standard drinks    Family History  Problem Relation Age of Onset  . Cancer Father        stomach  . Colon cancer Father   . Heart disease Brother   . Heart disease Brother   . Diabetes Other        granddaughter    Review of Systems: As noted in history of present illness. All other systems were reviewed and are negative.  Physical Exam: BP (!) 142/73 (BP Location: Left Arm, Cuff Size: Normal)   Pulse 79   Ht 5\' 3"  (1.6 m)   Wt 159 lb (72.1 kg)   BMI 28.17 kg/m  GENERAL:  Well appearing elderly WM in NAD HEENT:  PERRL, EOMI, sclera are clear. Oropharynx is clear. NECK:  No jugular venous distention, carotid upstroke brisk and symmetric, no bruits, no thyromegaly or adenopathy LUNGS:  Clear to auscultation bilaterally CHEST:  Unremarkable HEART:  RRR,  PMI not displaced or sustained,S1 and S2 within normal limits, no S3, no S4: no clicks, no rubs, gr 2/6 systolic murmur. ABD:  Soft, nontender. BS +, no masses or bruits. No hepatomegaly, no splenomegaly EXT:  2 + pulses throughout, no edema, no cyanosis no clubbing SKIN:  Warm and dry.  No rashes NEURO:  Alert and oriented x 3. Cranial nerves II through XII intact. PSYCH:  Cognitively intact    LABORATORY DATA: Lab Results  Component Value  Date   WBC 8.7 06/22/2015   HGB 10.8 (L) 06/22/2015   HCT 32.0 (L) 06/22/2015   PLT 179 06/22/2015   GLUCOSE 105 (H) 05/17/2015   CHOL 104 09/19/2011   TRIG 66 09/19/2011   HDL 38 (L) 09/19/2011   LDLCALC 53 09/19/2011   ALT 22 05/11/2015   AST 26 05/11/2015   NA 137 05/17/2015   K 4.1 05/17/2015   CL 107 05/17/2015   CREATININE 1.11 05/17/2015   BUN 47 (H)  05/17/2015   CO2 27 05/17/2015   TSH 0.797 09/18/2011   INR 1.45 05/15/2015   HGBA1C 5.5 05/11/2015   Labs dated 07/05/18: Normal CBC and CMET. Cholesterol 120, triglycerides 131, HDL 36, LDL 58.   Echo: 05/28/16:  Study Conclusions  - Left ventricle: The cavity size was mildly dilated. Wall   thickness was increased in a pattern of moderate LVH. Systolic   function was normal. The estimated ejection fraction was in the   range of 60% to 65%. Doppler parameters are consistent with   abnormal left ventricular relaxation (grade 1 diastolic   dysfunction). - Aortic valve: 26 mm Sapien 3 valve in excellent position with no   perivalvular regurgitation and stable gradients. - Mitral valve: Calcified annulus. Moderately thickened leaflets .   There was mild regurgitation. Valve area by pressure half-time:   2.5 cm^2. - Left atrium: The atrium was moderately dilated. - Atrial septum: No defect or patent foramen ovale was identified.    Assessment / Plan: 1. Coronary disease with remote stenting of the LAD in 1999. Cardiac catheterization Jan 2013 showed nonobstructive disease. He has no significant angina. We will continue with Plavix and statin therapy.  2. Severe aortic stenosis.s/p TAVR in August 2016. Excellent result. Follow up Echo in September 2017 looked good. Exam is stable.    3. Sinus node dysfunction with bradycardia. Status post pacemaker implant. Pacemaker followup in September was satisfactory. Follow up in pacer clinic.  4. Hyperlipidemia. On chronic Crestor therapy. Excellent control. LDL 58.   5.  AAA 4.7 cm. Followed by [facility]. Not a candidate for stent grafting. He is a poor candidate for open repair. Last [place] October 2019 was stable.   6. Renal mass. Followed by urology. Patient reports this is stable/smaller.   I will follow up in 6 months.

## 2018-08-02 ENCOUNTER — Encounter: Payer: Self-pay | Admitting: Cardiology

## 2018-08-02 ENCOUNTER — Ambulatory Visit: Payer: Medicare Other | Admitting: Cardiology

## 2018-08-02 VITALS — BP 142/73 | HR 79 | Ht 63.0 in | Wt 159.0 lb

## 2018-08-02 DIAGNOSIS — I714 Abdominal aortic aneurysm, without rupture, unspecified: Secondary | ICD-10-CM

## 2018-08-02 DIAGNOSIS — I350 Nonrheumatic aortic (valve) stenosis: Secondary | ICD-10-CM | POA: Diagnosis not present

## 2018-08-02 DIAGNOSIS — Z952 Presence of prosthetic heart valve: Secondary | ICD-10-CM | POA: Diagnosis not present

## 2018-08-02 DIAGNOSIS — Z950 Presence of cardiac pacemaker: Secondary | ICD-10-CM | POA: Diagnosis not present

## 2018-08-02 NOTE — Patient Instructions (Signed)
Continue your current therapy  Happy birthday!!

## 2018-08-25 ENCOUNTER — Ambulatory Visit: Payer: Medicare Other | Admitting: Internal Medicine

## 2018-08-25 ENCOUNTER — Encounter: Payer: Self-pay | Admitting: Internal Medicine

## 2018-08-25 VITALS — BP 144/86 | HR 76 | Ht 63.0 in | Wt 159.0 lb

## 2018-08-25 DIAGNOSIS — I441 Atrioventricular block, second degree: Secondary | ICD-10-CM | POA: Diagnosis not present

## 2018-08-25 DIAGNOSIS — Z950 Presence of cardiac pacemaker: Secondary | ICD-10-CM | POA: Diagnosis not present

## 2018-08-25 DIAGNOSIS — I2510 Atherosclerotic heart disease of native coronary artery without angina pectoris: Secondary | ICD-10-CM | POA: Diagnosis not present

## 2018-08-25 DIAGNOSIS — Z952 Presence of prosthetic heart valve: Secondary | ICD-10-CM | POA: Diagnosis not present

## 2018-08-25 NOTE — Progress Notes (Signed)
HPI [name] returns today for ongoing evaluation and management of complete heart block, aortic stenosis, status post valve replacement 3 years ago, permanent pacemaker insertion, hypertension, and coronary artery disease.  In the interim, he has been stable and has not been in the hospital.  He denies chest pain or shortness of breath.  He does admit to not being as active as he once was.  No syncope.  He has very mild peripheral edema. Allergies  Allergen Reactions  . Aspirin Other (See Comments)    Stomach bleeds  . Nitroglycerin     Makes patient faint     Current Outpatient Medications  Medication Sig Dispense Refill  . acetaminophen (TYLENOL) 325 MG tablet Take 650 mg by mouth every 6 (six) hours as needed for mild pain, moderate pain or fever.     . clopidogrel (PLAVIX) 75 MG tablet TAKE 1 TABLET BY MOUTH EVERY DAY 90 tablet 0  . esomeprazole (NEXIUM) 20 MG capsule Take 1 capsule (20 mg total) by mouth daily at 12 noon. 90 capsule 3  . ferrous sulfate 325 (65 FE) MG tablet Take 325 mg by mouth every evening.     . NORVASC 5 MG tablet TAKE 1 TABLET (5 MG TOTAL) BY MOUTH DAILY. 90 tablet 2  . rosuvastatin (CRESTOR) 5 MG tablet Take 5 mg by mouth daily.    . silodosin (RAPAFLO) 4 MG CAPS capsule Take 4 mg by mouth every evening.      No current facility-administered medications for this visit.      Past Medical History:  Diagnosis Date  . AAA (abdominal aortic aneurysm) ([place])   . Anemia   . Aortic stenosis   . Arthritis 09/18/11   "in my knees"  . Basal cell carcinoma of skin   . Blood transfusion   . BPH (benign prostatic hyperplasia)   . CAD (coronary artery disease)    Remote stent to LAD in 1999. Last cath in 2004 and he is managed medically  . Colon polyps   . Complication of anesthesia   . Dysrhythmia    paced  . GERD (gastroesophageal reflux disease)   . Hemorrhoid   . HTN (hypertension)   . Hyperlipidemia   . Lung nodule    RLL  . 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 Renal mass, left   . 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   Past Surgical History:  Procedure Laterality Date  . CARDIAC CATHETERIZATION  2004   Managed medically  . CARDIAC CATHETERIZATION    . CARDIAC CATHETERIZATION N/A 04/11/2015   Procedure: Right/Left Heart Cath and Coronary Angiography;  Surgeon: Burnell Blanks, MD;  Location: [place] [facility];  Service: Cardiovascular;  Laterality: N/A;  . CORONARY ANGIOGRAM  09/19/2011   Procedure: CORONARY ANGIOGRAM;  Surgeon: Josue Hector, MD;  Location: [facility] CATH LAB;  Service: Cardiovascular;;  . CORONARY ANGIOPLASTY WITH STENT PLACEMENT  05/09/1998   "1"; LAD  . INSERT / REPLACE / REMOVE PACEMAKER  2004   initial placement; [employer]tronic  . INSERT / REPLACE / REMOVE PACEMAKER  02/14/2011  . NASAL HEMORRHAGE CONTROL  1980's   Clips placed to stop bleeding  . SKIN CANCER EXCISION  09/18/11   "have had a couple  hundred of them removed since I was in my 40's"  . TEE WITHOUT CARDIOVERSION N/A 05/15/2015   Procedure: TRANSESOPHAGEAL ECHOCARDIOGRAM (TEE);  Surgeon: Burnell Blanks, MD;  Location: [place];  Service: Open Heart Surgery;  Laterality: N/A;  . TONSILLECTOMY     "when I was a teenager"  . TRANSCATHETER AORTIC VALVE REPLACEMENT, TRANSFEMORAL N/A 05/15/2015   Procedure: TRANSCATHETER AORTIC VALVE REPLACEMENT, TRANSFEMORAL;  Surgeon: Burnell Blanks, MD;  Location: [place];  Service: Open Heart Surgery;  Laterality: N/A;     Family History  Problem Relation Age of Onset  . Cancer Father        stomach  . Colon cancer Father   . Heart disease Brother   . Heart disease Brother   . Diabetes Other        granddaughter     Social History   Socioeconomic History  . Marital status: Married     Spouse name: Not on file  . Number of children: 2  . Years of education: Not on file  . Highest education level: Not on file  Occupational History  . Occupation: Retired    Comment: Building control surveyor  Social Needs  . Financial resource strain: Not on file  . Food insecurity:    Worry: Not on file    Inability: Not on file  . Transportation needs:    Medical: Not on file    Non-medical: Not on file  Tobacco Use  . Smoking status: Former Smoker    Packs/day: 1.00    Years: 42.00    Pack years: 42.00    Types: Cigarettes    Last attempt to quit: 08/18/1974    Years since quitting: 44.0  . Smokeless tobacco: Former Systems developer    Types: Chew  Substance and Sexual Activity  . Alcohol use: No    Alcohol/week: 0.0 standard drinks  . Drug use: No  . Sexual activity: Yes  Lifestyle  . Physical activity:    Days per week: Not on file    Minutes per session: Not on file  . Stress: Not on file  Relationships  . Social connections:    Talks on phone: Not on file    Gets together: Not on file    Attends religious service: Not on file    Active member of club or organization: Not on file    Attends meetings of clubs or organizations: Not on file    Relationship status: Not on file  . Intimate partner violence:    Fear of current or ex partner: Not on file    Emotionally abused: Not on file    Physically abused: Not on file    Forced sexual activity: Not on file  Other Topics Concern  . Not on file  Social History Narrative  . Not on file     BP (!) 144/86   Pulse 76   Ht 5\' 3"  (1.6 m)   Wt 159 lb (72.1 kg)   SpO2 97%   BMI 28.17 kg/m   Physical Exam:  Well appearing NAD HEENT: Unremarkable Neck:  No JVD, no thyromegally Lymphatics:  No adenopathy Back:  No CVA tenderness Lungs:  Clear HEART:  Regular rate rhythm, no murmurs, no rubs, no clicks Abd:  soft, positive bowel sounds, no organomegally, no rebound, no guarding Ext:  2 plus pulses, no edema, no cyanosis, no  clubbing Skin:  No rashes no nodules Neuro:  CN II through XII intact, motor grossly intact   DEVICE  Normal device function.  See PaceArt for details.   Assess/Plan: 1. PPM - his medtronic DDD PM is working normally.  2. CHB - he is asymptomatic, pacing in the ventricle about 99% of the time. 3. HTN - his blood pressure is reasonably well controlled.  Mikle [email or web address].D.

## 2018-08-25 NOTE — Patient Instructions (Signed)
Medication Instructions:  Your physician recommends that you continue on your current medications as directed. Please refer to the Current Medication list given to you today.  Labwork: None ordered.  Testing/Procedures: None ordered.  Follow-Up: Your physician wants you to follow-up in: one year with Dr. Lovena Le.   You will receive a reminder letter in the mail two months in advance. If you don't receive a letter, please call our office to schedule the follow-up appointment.  Remote monitoring is used to monitor your Pacemaker from home. This monitoring reduces the number of office visits required to check your device to one time per year. It allows [place] to keep an eye on the functioning of your device to ensure it is working properly. You are scheduled for a device check from home on 09/06/2018. You may send your transmission at any time that day. If you have a wireless device, the transmission will be sent automatically. After your physician reviews your transmission, you will receive a postcard with your next transmission date.  Any Other Special Instructions Will Be Listed Below (If Applicable).  If you need a refill on your cardiac medications before your next appointment, please call your pharmacy.

## 2018-09-02 ENCOUNTER — Other Ambulatory Visit: Payer: Self-pay | Admitting: Cardiology

## 2018-09-03 LAB — CUP PACEART INCLINIC DEVICE CHECK
Implantable Lead Implant Date: 20040811
Implantable Lead Location: 753859
Implantable Lead Serial Number: 436022
Implantable Pulse Generator Implant Date: 20120601
MDC IDC LEAD IMPLANT DT: 20040811
MDC IDC LEAD LOCATION: 753860
MDC IDC LEAD SERIAL: 421267
MDC IDC SESS DTM: 20191220144029

## 2018-09-06 ENCOUNTER — Ambulatory Visit (INDEPENDENT_AMBULATORY_CARE_PROVIDER_SITE_OTHER): Payer: Medicare Other

## 2018-09-06 DIAGNOSIS — I441 Atrioventricular block, second degree: Secondary | ICD-10-CM | POA: Diagnosis not present

## 2018-09-07 LAB — CUP PACEART REMOTE DEVICE CHECK
Battery Impedance: 1966 Ohm
Battery Remaining Longevity: 28 mo
Brady Statistic AP VP Percent: 33 %
Brady Statistic AS VP Percent: 66 %
Brady Statistic AS VS Percent: 0 %
Implantable Lead Implant Date: 20040811
Implantable Lead Location: 753859
Implantable Lead Model: 4470
Implantable Pulse Generator Implant Date: 20120601
Lead Channel Impedance Value: 474 Ohm
Lead Channel Impedance Value: 480 Ohm
Lead Channel Pacing Threshold Amplitude: 0.75 V
Lead Channel Pacing Threshold Amplitude: 0.875 V
Lead Channel Setting Pacing Amplitude: 2 V
Lead Channel Setting Pacing Amplitude: 2.5 V
MDC IDC LEAD IMPLANT DT: 20040811
MDC IDC LEAD LOCATION: 753860
MDC IDC LEAD SERIAL: 421267
MDC IDC LEAD SERIAL: 436022
MDC IDC MSMT BATTERY VOLTAGE: 2.73 V
MDC IDC MSMT LEADCHNL RA PACING THRESHOLD PULSEWIDTH: 0.4 ms
MDC IDC MSMT LEADCHNL RV PACING THRESHOLD PULSEWIDTH: 0.4 ms
MDC IDC SESS DTM: 20191223184835
MDC IDC SET LEADCHNL RV PACING PULSEWIDTH: 0.4 ms
MDC IDC SET LEADCHNL RV SENSING SENSITIVITY: 1 mV
MDC IDC STAT BRADY AP VS PERCENT: 0 %

## 2018-09-07 NOTE — Progress Notes (Signed)
Remote pacemaker transmission.   

## 2018-11-16 ENCOUNTER — Other Ambulatory Visit: Payer: Self-pay

## 2018-11-16 MED ORDER — CLOPIDOGREL BISULFATE 75 MG PO TABS: 75.0000 mg | ORAL_TABLET | Freq: Every day | ORAL | 2 refills | Status: DC

## 2018-12-06 ENCOUNTER — Ambulatory Visit (INDEPENDENT_AMBULATORY_CARE_PROVIDER_SITE_OTHER): Payer: Medicare Other | Admitting: *Deleted

## 2018-12-06 ENCOUNTER — Other Ambulatory Visit: Payer: Self-pay

## 2018-12-06 DIAGNOSIS — I441 Atrioventricular block, second degree: Secondary | ICD-10-CM | POA: Diagnosis not present

## 2018-12-07 LAB — CUP PACEART REMOTE DEVICE CHECK
Battery Remaining Longevity: 27 mo
Battery Voltage: 2.72 V
Brady Statistic AP VS Percent: 0 %
Implantable Lead Implant Date: 20040811
Implantable Lead Location: 753859
Implantable Lead Model: 4469
Implantable Lead Model: 4470
Implantable Lead Serial Number: 436022
Lead Channel Impedance Value: 476 Ohm
Lead Channel Impedance Value: 478 Ohm
Lead Channel Pacing Threshold Amplitude: 0.75 V
Lead Channel Pacing Threshold Amplitude: 1 V
Lead Channel Pacing Threshold Pulse Width: 0.4 ms
Lead Channel Setting Pacing Amplitude: 2.5 V
Lead Channel Setting Pacing Pulse Width: 0.4 ms
MDC IDC LEAD IMPLANT DT: 20040811
MDC IDC LEAD LOCATION: 753860
MDC IDC LEAD SERIAL: 421267
MDC IDC MSMT BATTERY IMPEDANCE: 2067 Ohm
MDC IDC MSMT LEADCHNL RV PACING THRESHOLD PULSEWIDTH: 0.4 ms
MDC IDC PG IMPLANT DT: 20120601
MDC IDC SESS DTM: 20200323132205
MDC IDC SET LEADCHNL RA PACING AMPLITUDE: 2 V
MDC IDC SET LEADCHNL RV SENSING SENSITIVITY: 1 mV
MDC IDC STAT BRADY AP VP PERCENT: 27 %
MDC IDC STAT BRADY AS VP PERCENT: 73 %
MDC IDC STAT BRADY AS VS PERCENT: 0 %

## 2018-12-15 NOTE — Progress Notes (Signed)
Remote pacemaker transmission.   

## 2019-01-03 ENCOUNTER — Other Ambulatory Visit: Payer: Self-pay

## 2019-01-03 MED ORDER — AMLODIPINE BESYLATE 5 MG PO TABS: ORAL_TABLET | ORAL | 2 refills | Status: DC

## 2019-01-28 ENCOUNTER — Telehealth: Payer: Self-pay | Admitting: Cardiology

## 2019-01-28 NOTE — Telephone Encounter (Signed)
Mychart, no smartphone, consent, pre reg complete 01/28/19 AF

## 2019-01-29 NOTE — Progress Notes (Signed)
Virtual Visit via Telephone Note   This visit type was conducted due to national recommendations for restrictions regarding the COVID-19 Pandemic (e.g. social distancing) in an effort to limit this patient's exposure and mitigate transmission in our community.  Due to his co-morbid illnesses, this patient is at least at moderate risk for complications without adequate follow up.  This format is felt to be most appropriate for this patient at this time.  The patient did not have access to video technology/had technical difficulties with video requiring transitioning to audio format only (telephone).  All issues noted in this document were discussed and addressed.  No physical exam could be performed with this format.  Please refer to the patient's chart for his  consent to telehealth for [facility].   Date:  01/31/2019   ID:  Jordan [name], DOB 06/03/1918, MRN [id number]  Patient Location: Home Provider Location: Home  PCP:  Leanna Battles, MD  Cardiologist:  Keshawna Dix Martinique MD Electrophysiologist:  None   Evaluation Performed:  Follow-Up Visit  Chief Complaint:  Follow up CAD  History of Present Illness:    Jordan [name] is a 85 y.o. male seen today for follow up CAD, pacemaker and AVR.He has a history of coronary disease with remote stenting of the LAD in 1999. Cardiac catheterization January 2013 showed nonobstructive disease. He has a history of bradycardia requiring pacemaker implant. He has a history of severe aortic stenosis. He underwent TAVR on 05/15/15.  Follow up Echo in September 2017 looked good. Pacemaker follow up in February 2018 was satisfactory.  He has a 4.6 cm abdominal aortic aneurysm followed  by [facility]- last checked in October 2019. Stable at 4.7 cm.  Not a candidate for stent grafting. He was switched from Nexium to Protonix due to possible interaction with Plavix but protonix made him feel bad so he went back to nexium. Last pacemaker check in March showed  normal device function.  Since I last saw him his wife has passed away. He is coping fairly well. Denies any chest pain, dyspnea, palpitations. He did have some edema in his ankles but this is better now. He was having knee problems and had an injection with improvement.   The patient does not have symptoms concerning for COVID-19 infection (fever, chills, cough, or new shortness of breath).    Past Medical History:  Diagnosis Date  . AAA (abdominal aortic aneurysm) ([place])   . Anemia   . Aortic stenosis   . Arthritis 09/18/11   "in my knees"  . Basal cell carcinoma of skin   . Blood transfusion   . BPH (benign prostatic hyperplasia)   . CAD (coronary artery disease)    Remote stent to LAD in 1999. Last cath in 2004 and he is managed medically  . Colon polyps   . Complication of anesthesia   . Dysrhythmia    paced  . GERD (gastroesophageal reflux disease)   . Hemorrhoid   . HTN (hypertension)   . Hyperlipidemia   . Lung nodule    RLL  . 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 Renal mass, left   . 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 CARDIAC CATHETERIZATION  2004   Managed medically  . CARDIAC CATHETERIZATION    . CARDIAC CATHETERIZATION N/A 04/11/2015  Procedure: Right/Left Heart Cath and Coronary Angiography;  Surgeon: Burnell Blanks, MD;  Location: [place] CV LAB;  Service: Cardiovascular;  Laterality: N/A;  . CORONARY ANGIOGRAM  09/19/2011   Procedure: CORONARY ANGIOGRAM;  Surgeon: Josue Hector, MD;  Location: [facility] CATH LAB;  Service: Cardiovascular;;  . CORONARY ANGIOPLASTY WITH STENT PLACEMENT  05/09/1998   "1"; LAD  . INSERT / REPLACE / REMOVE PACEMAKER  2004   initial placement; [employer]  . INSERT / REPLACE / REMOVE PACEMAKER  02/14/2011  . NASAL HEMORRHAGE CONTROL  1980's    Clips placed to stop bleeding  . SKIN CANCER EXCISION  09/18/11   "have had a couple hundred of them removed since I was in my 40's"  . TEE WITHOUT CARDIOVERSION N/A 05/15/2015   Procedure: TRANSESOPHAGEAL ECHOCARDIOGRAM (TEE);  Surgeon: Burnell Blanks, MD;  Location: [place];  Service: Open Heart Surgery;  Laterality: N/A;  . TONSILLECTOMY     "when I was a teenager"  . TRANSCATHETER AORTIC VALVE REPLACEMENT, TRANSFEMORAL N/A 05/15/2015   Procedure: TRANSCATHETER AORTIC VALVE REPLACEMENT, TRANSFEMORAL;  Surgeon: Burnell Blanks, MD;  Location: [place];  Service: Open Heart Surgery;  Laterality: N/A;     Current Meds  Medication Sig  . acetaminophen (TYLENOL) 325 MG tablet Take 650 mg by mouth every 6 (six) hours as needed for mild pain, moderate pain or fever.   Marland Kitchen amLODipine (NORVASC) 5 MG tablet TAKE 1 TABLET (5 MG TOTAL) BY MOUTH DAILY.  Marland Kitchen atorvastatin (LIPITOR) 10 MG tablet   . clopidogrel (PLAVIX) 75 MG tablet Take 1 tablet (75 mg total) by mouth daily.  . ferrous sulfate 325 (65 FE) MG tablet Take 325 mg by mouth every evening.   Marland Kitchen NEXIUM 24HR CLEAR MINIS 20 MG capsule TAKE 1 CAPSULE (20 MG TOTAL) BY MOUTH DAILY AT 12 NOON.  . silodosin (RAPAFLO) 4 MG CAPS capsule Take 4 mg by mouth every evening.      Allergies:   Aspirin and Nitroglycerin   Social History   Tobacco Use  . Smoking status: Former Smoker    Packs/day: 1.00    Years: 42.00    Pack years: 42.00    Types: Cigarettes    Last attempt to quit: 08/18/1974    Years since quitting: 44.4  . Smokeless tobacco: Former Systems developer    Types: Chew  Substance Use Topics  . Alcohol use: No    Alcohol/week: 0.0 standard drinks  . Drug use: No     Family Hx: The patient's family history includes Cancer in his father; Colon cancer in his father; Diabetes in an other family member; Heart disease in his brother and brother.  ROS:   Please see the history of present illness.    All other systems reviewed and are negative.    Prior CV studies:   The following studies were reviewed today:  none  Labs/Other Tests and Data Reviewed:    EKG:  No ECG reviewed.  Recent Labs: No results found for requested labs within last 8760 hours.   Recent Lipid Panel Lab Results  Component Value Date/Time   CHOL 104 09/19/2011 01:35 AM   TRIG 66 09/19/2011 01:35 AM   HDL 38 (L) 09/19/2011 01:35 AM   CHOLHDL 2.7 09/19/2011 01:35 AM   LDLCALC 53 09/19/2011 01:35 AM   Labs dated 07/05/18: Normal CBC and CMET. Cholesterol 120, triglycerides 131, HDL 36, LDL 58.   Wt Readings from Last 3 Encounters:  01/31/19 160 lb 12.8 oz (  72.9 kg)  08/25/18 159 lb (72.1 kg)  08/02/18 159 lb (72.1 kg)     Objective:    Vital Signs:  BP 134/67   Pulse 81   Ht 5\' 1"  (1.549 m)   Wt 160 lb 12.8 oz (72.9 kg)   BMI 30.38 kg/m    VITAL SIGNS:  reviewed  ASSESSMENT & PLAN:    1. Coronary disease with remote stenting of the LAD in 1999. Cardiac catheterization Jan 2013 showed nonobstructive disease. He has no significant angina. We will continue with Plavix and statin therapy.  2. Severe aortic stenosis.s/p TAVR in August 2016. Excellent result. Follow up Echo in September 2017 looked good.   3. Sinus node dysfunction with bradycardia. Status post pacemaker implant. Pacemaker followup in March was satisfactory. Follow up in pacer clinic.  4. Hyperlipidemia. On chronic Crestor therapy. Excellent control. LDL 58.   5. AAA 4.7 cm. Followed by [facility]. Not a candidate for stent grafting. He is a poor candidate for open repair. Last [place] October 2019 was stable.   6. Renal mass. Followed by urology. Patient reports this is stable/smaller. being monitored.   COVID-19 Education: The signs and symptoms of COVID-19 were discussed with the patient and how to seek care for testing (follow up with PCP or arrange E-visit).  The importance of social distancing was discussed today.  Time:   Today, I have spent 10 minutes with the  patient with telehealth technology discussing the above problems.     Medication Adjustments/Labs and Tests Ordered: Current medicines are reviewed at length with the patient today.  Concerns regarding medicines are outlined above.   Tests Ordered: No orders of the defined types were placed in this encounter.   Medication Changes: No orders of the defined types were placed in this encounter.   Disposition:  Follow up in 6 month(s)  Signed, Malyk Girouard Martinique, MD  01/31/2019 2:35 PM    [facility]

## 2019-01-31 ENCOUNTER — Telehealth (INDEPENDENT_AMBULATORY_CARE_PROVIDER_SITE_OTHER): Payer: Medicare Other | Admitting: Cardiology

## 2019-01-31 ENCOUNTER — Ambulatory Visit: Payer: Self-pay | Admitting: Cardiology

## 2019-01-31 ENCOUNTER — Encounter: Payer: Self-pay | Admitting: Cardiology

## 2019-01-31 VITALS — BP 134/67 | HR 81 | Ht 61.0 in | Wt 160.8 lb

## 2019-01-31 DIAGNOSIS — I2510 Atherosclerotic heart disease of native coronary artery without angina pectoris: Secondary | ICD-10-CM

## 2019-01-31 DIAGNOSIS — Z952 Presence of prosthetic heart valve: Secondary | ICD-10-CM | POA: Diagnosis not present

## 2019-01-31 DIAGNOSIS — I441 Atrioventricular block, second degree: Secondary | ICD-10-CM

## 2019-01-31 DIAGNOSIS — Z950 Presence of cardiac pacemaker: Secondary | ICD-10-CM

## 2019-01-31 NOTE — Patient Instructions (Addendum)
Continue your current therapy.  Restrict sodium intake  Follow up in 6 months     Call 3 months before to schedule

## 2019-03-07 ENCOUNTER — Ambulatory Visit (INDEPENDENT_AMBULATORY_CARE_PROVIDER_SITE_OTHER): Payer: Medicare Other | Admitting: *Deleted

## 2019-03-07 DIAGNOSIS — I441 Atrioventricular block, second degree: Secondary | ICD-10-CM

## 2019-03-07 LAB — CUP PACEART REMOTE DEVICE CHECK
Battery Impedance: 2091 Ohm
Battery Remaining Longevity: 27 mo
Battery Voltage: 2.72 V
Brady Statistic AP VP Percent: 25 %
Brady Statistic AP VS Percent: 0 %
Brady Statistic AS VP Percent: 75 %
Brady Statistic AS VS Percent: 0 %
Date Time Interrogation Session: 20200622131709
Implantable Lead Implant Date: 20040811
Implantable Lead Implant Date: 20040811
Implantable Lead Location: 753859
Implantable Lead Location: 753860
Implantable Lead Model: 4469
Implantable Lead Model: 4470
Implantable Lead Serial Number: 421267
Implantable Lead Serial Number: 436022
Implantable Pulse Generator Implant Date: 20120601
Lead Channel Impedance Value: 480 Ohm
Lead Channel Impedance Value: 506 Ohm
Lead Channel Pacing Threshold Amplitude: 0.75 V
Lead Channel Pacing Threshold Amplitude: 0.875 V
Lead Channel Pacing Threshold Pulse Width: 0.4 ms
Lead Channel Pacing Threshold Pulse Width: 0.4 ms
Lead Channel Setting Pacing Amplitude: 2 V
Lead Channel Setting Pacing Amplitude: 2.5 V
Lead Channel Setting Pacing Pulse Width: 0.4 ms
Lead Channel Setting Sensing Sensitivity: 1 mV

## 2019-03-16 NOTE — Progress Notes (Signed)
Remote pacemaker transmission.   

## 2019-06-07 ENCOUNTER — Ambulatory Visit (INDEPENDENT_AMBULATORY_CARE_PROVIDER_SITE_OTHER): Payer: Medicare Other | Admitting: *Deleted

## 2019-06-07 DIAGNOSIS — I441 Atrioventricular block, second degree: Secondary | ICD-10-CM

## 2019-06-07 LAB — CUP PACEART REMOTE DEVICE CHECK
Battery Impedance: 2171 Ohm
Battery Remaining Longevity: 26 mo
Battery Voltage: 2.71 V
Brady Statistic AP VP Percent: 21 %
Brady Statistic AP VS Percent: 0 %
Brady Statistic AS VP Percent: 79 %
Brady Statistic AS VS Percent: 0 %
Date Time Interrogation Session: 20200922134642
Implantable Lead Implant Date: 20040811
Implantable Lead Implant Date: 20040811
Implantable Lead Location: 753859
Implantable Lead Location: 753860
Implantable Lead Model: 4469
Implantable Lead Model: 4470
Implantable Lead Serial Number: 421267
Implantable Lead Serial Number: 436022
Implantable Pulse Generator Implant Date: 20120601
Lead Channel Impedance Value: 485 Ohm
Lead Channel Impedance Value: 491 Ohm
Lead Channel Pacing Threshold Amplitude: 0.75 V
Lead Channel Pacing Threshold Amplitude: 0.875 V
Lead Channel Pacing Threshold Pulse Width: 0.4 ms
Lead Channel Pacing Threshold Pulse Width: 0.4 ms
Lead Channel Setting Pacing Amplitude: 2 V
Lead Channel Setting Pacing Amplitude: 2.5 V
Lead Channel Setting Pacing Pulse Width: 0.4 ms
Lead Channel Setting Sensing Sensitivity: 1 mV

## 2019-06-17 NOTE — Progress Notes (Signed)
Remote pacemaker transmission.   

## 2019-07-09 ENCOUNTER — Other Ambulatory Visit: Payer: Self-pay | Admitting: Cardiology

## 2019-07-11 ENCOUNTER — Other Ambulatory Visit: Payer: Self-pay

## 2019-07-11 MED ORDER — ATORVASTATIN CALCIUM 10 MG PO TABS: 10.0000 mg | ORAL_TABLET | Freq: Every day | ORAL | 0 refills | Status: DC

## 2019-07-18 ENCOUNTER — Telehealth: Payer: Self-pay | Admitting: Cardiology

## 2019-07-18 NOTE — Telephone Encounter (Signed)
Returned call to patient's son Louie Casa he stated father is due to see Dr.Jordan.Virtual visit scheduled 08/31/19 at 10:00 am.Stated father is doing well he will turn 89 this month.Advised to call sooner if needed.

## 2019-07-18 NOTE — Telephone Encounter (Signed)
° ° °  Patient requesting to speak with Dr Doug Sou nurse prior to scheduling an  Appointment. Received recall letter , declined to allow scheduler to assist

## 2019-08-20 ENCOUNTER — Other Ambulatory Visit: Payer: Self-pay | Admitting: Cardiology

## 2019-08-22 ENCOUNTER — Other Ambulatory Visit: Payer: Self-pay

## 2019-08-22 MED ORDER — ATORVASTATIN CALCIUM 10 MG PO TABS: 10.0000 mg | ORAL_TABLET | Freq: Every day | ORAL | 0 refills | Status: DC

## 2019-08-29 NOTE — Progress Notes (Signed)
Virtual Visit via Telephone Note   This visit type was conducted due to national recommendations for restrictions regarding the COVID-19 Pandemic (e.g. social distancing) in an effort to limit this patient's exposure and mitigate transmission in our community.  Due to his co-morbid illnesses, this patient is at least at moderate risk for complications without adequate follow up.  This format is felt to be most appropriate for this patient at this time.  The patient did not have access to video technology/had technical difficulties with video requiring transitioning to audio format only (telephone).  All issues noted in this document were discussed and addressed.  No physical exam could be performed with this format.  Please refer to the patient's chart for his  consent to telehealth for [facility].   Date:  08/31/2019   ID:  Jordan [name], DOB May 14, 1918, MRN [id number]  Patient Location: Home Provider Location: Home  PCP:  Leanna Battles, MD  Cardiologist:  Tian Mcmurtrey Martinique MD Electrophysiologist:  None   Evaluation Performed:  Follow-Up Visit  Chief Complaint:  Follow up CAD  History of Present Illness:    Jordan [name] is a 85 y.o. male seen today for follow up CAD, pacemaker and AVR.He has a history of coronary disease with remote stenting of the LAD in 1999. Cardiac catheterization January 2013 showed nonobstructive disease. He has a history of bradycardia requiring pacemaker implant. He has a history of severe aortic stenosis. He underwent TAVR on 05/15/15.  Follow up Echo in September 2017 looked good. Pacemaker follow up in February 2018 was satisfactory.  He has a 4.6 cm abdominal aortic aneurysm followed  by [facility]- last checked in October 2019. Stable at 4.7 cm.  Not a candidate for stent grafting. He was switched from Nexium to Protonix due to possible interaction with Plavix but protonix made him feel bad so he went back to nexium. Last pacemaker check in September showed  normal device function.  Since I last saw him his wife has passed away. He is coping fairly well. Denies any chest pain, dyspnea, palpitations. He did have some edema in his ankles but this is better now. He was having knee problems and has periodic  injections with improvement. He is seeing a dermatologist for a skin condition.   The patient does not have symptoms concerning for COVID-19 infection (fever, chills, cough, or new shortness of breath).    Past Medical History:  Diagnosis Date  . AAA (abdominal aortic aneurysm) ([place])   . Anemia   . Aortic stenosis   . Arthritis 09/18/11   "in my knees"  . Basal cell carcinoma of skin   . Blood transfusion   . BPH (benign prostatic hyperplasia)   . CAD (coronary artery disease)    Remote stent to LAD in 1999. Last cath in 2004 and he is managed medically  . Colon polyps   . Complication of anesthesia   . Dysrhythmia    paced  . GERD (gastroesophageal reflux disease)   . Hemorrhoid   . HTN (hypertension)   . Hyperlipidemia   . Lung nodule    RLL  . 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 Renal mass, left   . 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 CARDIAC CATHETERIZATION  2004   Managed medically  . CARDIAC  CATHETERIZATION    . CARDIAC CATHETERIZATION N/A 04/11/2015   Procedure: Right/Left Heart Cath and Coronary Angiography;  Surgeon: Burnell Blanks, MD;  Location: [place] CV LAB;  Service: Cardiovascular;  Laterality: N/A;  . CORONARY ANGIOGRAM  09/19/2011   Procedure: CORONARY ANGIOGRAM;  Surgeon: Josue Hector, MD;  Location: [facility] CATH LAB;  Service: Cardiovascular;;  . CORONARY ANGIOPLASTY WITH STENT PLACEMENT  05/09/1998   "1"; LAD  . INSERT / REPLACE / REMOVE PACEMAKER  2004   initial placement; [employer]  . INSERT / REPLACE / REMOVE  PACEMAKER  02/14/2011  . NASAL HEMORRHAGE CONTROL  1980's   Clips placed to stop bleeding  . SKIN CANCER EXCISION  09/18/11   "have had a couple hundred of them removed since I was in my 40's"  . TEE WITHOUT CARDIOVERSION N/A 05/15/2015   Procedure: TRANSESOPHAGEAL ECHOCARDIOGRAM (TEE);  Surgeon: Burnell Blanks, MD;  Location: [place];  Service: Open Heart Surgery;  Laterality: N/A;  . TONSILLECTOMY     "when I was a teenager"  . TRANSCATHETER AORTIC VALVE REPLACEMENT, TRANSFEMORAL N/A 05/15/2015   Procedure: TRANSCATHETER AORTIC VALVE REPLACEMENT, TRANSFEMORAL;  Surgeon: Burnell Blanks, MD;  Location: [place];  Service: Open Heart Surgery;  Laterality: N/A;     Current Meds  Medication Sig  . acetaminophen (TYLENOL) 325 MG tablet Take 650 mg by mouth every 6 (six) hours as needed for mild pain, moderate pain or fever.   Marland Kitchen amLODipine (NORVASC) 5 MG tablet TAKE 1 TABLET BY MOUTH EVERY DAY  . atorvastatin (LIPITOR) 10 MG tablet Take 1 tablet (10 mg total) by mouth daily.  . clopidogrel (PLAVIX) 75 MG tablet TAKE 1 TABLET BY MOUTH EVERY DAY  . NEXIUM 24HR 20 MG capsule TAKE 1 CAPSULE BY MOUTH DAILY AT 12 NOON  . silodosin (RAPAFLO) 4 MG CAPS capsule Take 4 mg by mouth every evening.   . [DISCONTINUED] ferrous sulfate 325 (65 FE) MG tablet Take 325 mg by mouth every evening.      Allergies:   Aspirin and Nitroglycerin   Social History   Tobacco Use  . Smoking status: Former Smoker    Packs/day: 1.00    Years: 42.00    Pack years: 42.00    Types: Cigarettes    Quit date: 08/18/1974    Years since quitting: 45.0  . Smokeless tobacco: Former Systems developer    Types: Chew  Substance Use Topics  . Alcohol use: No    Alcohol/week: 0.0 standard drinks  . Drug use: No     Family Hx: The patient's family history includes Cancer in his father; Colon cancer in his father; Diabetes in an other family member; Heart disease in his brother and brother.  ROS:   Please see the history of  present illness.    All other systems reviewed and are negative.   Prior CV studies:   The following studies were reviewed today:  none  Labs/Other Tests and Data Reviewed:    EKG:  No ECG reviewed.  Recent Labs: No results found for requested labs within last 8760 hours.   Recent Lipid Panel Lab Results  Component Value Date/Time   CHOL 104 09/19/2011 01:35 AM   TRIG 66 09/19/2011 01:35 AM   HDL 38 (L) 09/19/2011 01:35 AM   CHOLHDL 2.7 09/19/2011 01:35 AM   LDLCALC 53 09/19/2011 01:35 AM   Labs dated 07/05/18: Normal CBC and CMET. Cholesterol 120, triglycerides 131, HDL 36, LDL 58.   Wt Readings from  Last 3 Encounters:  08/31/19 159 lb 4 oz (72.2 kg)  01/31/19 160 lb 12.8 oz (72.9 kg)  08/25/18 159 lb (72.1 kg)     Objective:    Vital Signs:  BP 127/64   Pulse 82   Ht 5\' 2"  (1.575 m)   Wt 159 lb 4 oz (72.2 kg)   BMI 29.13 kg/m    VITAL SIGNS:  reviewed  ASSESSMENT & PLAN:    1. Coronary disease with remote stenting of the LAD in 1999. Cardiac catheterization Jan 2013 showed nonobstructive disease. He has no significant angina. We will continue with Plavix and statin therapy.  2. Severe aortic stenosis.s/p TAVR in August 2016. Excellent result. Follow up Echo in September 2017 looked good.   3. Sinus node dysfunction with bradycardia. Status post pacemaker implant. Pacemaker followup in September was satisfactory. Follow up in pacer clinic.  4. Hyperlipidemia. On chronic Crestor therapy. Excellent control. Labs followed by Dr Philip Aspen.  5. AAA 4.7 cm. Followed by [facility]. Not a candidate for stent grafting. He is a poor candidate for open repair. Last [place] October 2019 was stable.   6. Renal mass. Followed by urology. Patient reports this is stable/smaller. being monitored.   COVID-19 Education: The signs and symptoms of COVID-19 were discussed with the patient and how to seek care for testing (follow up with PCP or arrange E-visit).  The importance of  social distancing was discussed today.  Time:   Today, I have spent 10 minutes with the patient with telehealth technology discussing the above problems.     Medication Adjustments/Labs and Tests Ordered: Current medicines are reviewed at length with the patient today.  Concerns regarding medicines are outlined above.   Tests Ordered: No orders of the defined types were placed in this encounter.   Medication Changes: No orders of the defined types were placed in this encounter.   Disposition:  Follow up in 6 month(s)  Signed, Real Cona Martinique, MD  08/31/2019 9:49 AM    [facility]

## 2019-08-31 ENCOUNTER — Encounter: Payer: Self-pay | Admitting: Cardiology

## 2019-08-31 ENCOUNTER — Telehealth (INDEPENDENT_AMBULATORY_CARE_PROVIDER_SITE_OTHER): Payer: Medicare Other | Admitting: Cardiology

## 2019-08-31 VITALS — BP 127/64 | HR 82 | Ht 62.0 in | Wt 159.2 lb

## 2019-08-31 DIAGNOSIS — Z952 Presence of prosthetic heart valve: Secondary | ICD-10-CM

## 2019-08-31 DIAGNOSIS — Z950 Presence of cardiac pacemaker: Secondary | ICD-10-CM

## 2019-08-31 DIAGNOSIS — I441 Atrioventricular block, second degree: Secondary | ICD-10-CM

## 2019-08-31 DIAGNOSIS — I714 Abdominal aortic aneurysm, without rupture, unspecified: Secondary | ICD-10-CM

## 2019-08-31 DIAGNOSIS — I2510 Atherosclerotic heart disease of native coronary artery without angina pectoris: Secondary | ICD-10-CM

## 2019-08-31 NOTE — Patient Instructions (Signed)
Medication Instructions:  Continue same medications   Lab Work: None ordered  Testing/Procedures: None ordered  Follow-Up: At [facility], you and your health needs are our priority.  As part of our continuing mission to provide you with exceptional heart care, we have created designated Provider Care Teams.  These Care Teams include your primary Cardiologist (physician) and Advanced Practice Providers (APPs -  Physician Assistants and Nurse Practitioners) who all work together to provide you with the care you need, when you need it.  Your next appointment:  6 months   Call in March to schedule June appointment.   The format for your next appointment: Office    Provider:  Dr.Jordan

## 2019-09-01 ENCOUNTER — Telehealth: Payer: Self-pay | Admitting: Internal Medicine

## 2019-09-01 NOTE — Telephone Encounter (Signed)
Son is calling because he would like to come with his dad to his visit to assist with ambulation. He states the patient is not steady on his feet.

## 2019-09-06 ENCOUNTER — Ambulatory Visit (INDEPENDENT_AMBULATORY_CARE_PROVIDER_SITE_OTHER): Payer: Medicare Other | Admitting: *Deleted

## 2019-09-06 ENCOUNTER — Encounter: Payer: Self-pay | Admitting: Internal Medicine

## 2019-09-06 ENCOUNTER — Ambulatory Visit: Payer: Medicare Other | Admitting: Internal Medicine

## 2019-09-06 ENCOUNTER — Other Ambulatory Visit: Payer: Self-pay

## 2019-09-06 VITALS — BP 132/70 | HR 106 | Ht 62.0 in | Wt 162.2 lb

## 2019-09-06 DIAGNOSIS — Z950 Presence of cardiac pacemaker: Secondary | ICD-10-CM

## 2019-09-06 DIAGNOSIS — I441 Atrioventricular block, second degree: Secondary | ICD-10-CM

## 2019-09-06 LAB — CUP PACEART REMOTE DEVICE CHECK
Battery Impedance: 2291 Ohm
Battery Remaining Longevity: 24 mo
Battery Voltage: 2.71 V
Brady Statistic AP VP Percent: 19 %
Brady Statistic AP VS Percent: 0 %
Brady Statistic AS VP Percent: 81 %
Brady Statistic AS VS Percent: 0 %
Date Time Interrogation Session: 20201222095406
Implantable Lead Implant Date: 20040811
Implantable Lead Implant Date: 20040811
Implantable Lead Location: 753859
Implantable Lead Location: 753860
Implantable Lead Model: 4469
Implantable Lead Model: 4470
Implantable Lead Serial Number: 421267
Implantable Lead Serial Number: 436022
Implantable Pulse Generator Implant Date: 20120601
Lead Channel Impedance Value: 478 Ohm
Lead Channel Impedance Value: 479 Ohm
Lead Channel Pacing Threshold Amplitude: 0.75 V
Lead Channel Pacing Threshold Amplitude: 1 V
Lead Channel Pacing Threshold Pulse Width: 0.4 ms
Lead Channel Pacing Threshold Pulse Width: 0.4 ms
Lead Channel Setting Pacing Amplitude: 2 V
Lead Channel Setting Pacing Amplitude: 2.5 V
Lead Channel Setting Pacing Pulse Width: 0.4 ms
Lead Channel Setting Sensing Sensitivity: 1 mV

## 2019-09-06 NOTE — Patient Instructions (Addendum)
Medication Instructions:  Your physician recommends that you continue on your current medications as directed. Please refer to the Current Medication list given to you today.  Labwork: None ordered.  Testing/Procedures: None ordered.  Follow-Up: Your physician wants you to follow-up in: one year with Dr Knox Saliva will receive a reminder letter in the mail two months in advance. If you don't receive a letter, please call our office to schedule the follow-up appointment.  Remote monitoring is used to monitor your Pacemaker  from home. This monitoring reduces the number of office visits required to check your device to one time per year. It allows [place] to keep an eye on the functioning of your device to ensure it is working properly. You are scheduled for a device check from home on 12/06/2019. You may send your transmission at any time that day. If you have a wireless device, the transmission will be sent automatically. After your physician reviews your transmission, you will receive a postcard with your next transmission date.  Any Other Special Instructions Will Be Listed Below (If Applicable).  If you need a refill on your cardiac medications before your next appointment, please call your pharmacy.

## 2019-09-06 NOTE — Progress Notes (Signed)
HPI Jordan [name] returns today for followup. He is a pleasant 85 yo man with aortic stenosis, s/p TAVR, CHB, s/p PPM, dyslipidemia and HTN. Despite his advanced age, he has not been in the hospital. He has dyspnea with exertion but is fairly sedentary. He has not had syncope. No anginal symptoms.  Allergies  Allergen Reactions  . Aspirin Other (See Comments)    Stomach bleeds  . Nitroglycerin     Makes patient faint     Current Outpatient Medications  Medication Sig Dispense Refill  . acetaminophen (TYLENOL) 325 MG tablet Take 650 mg by mouth every 6 (six) hours as needed for mild pain, moderate pain or fever.     Marland Kitchen amLODipine (NORVASC) 5 MG tablet TAKE 1 TABLET BY MOUTH EVERY DAY 90 tablet 2  . atorvastatin (LIPITOR) 10 MG tablet Take 1 tablet (10 mg total) by mouth daily. 60 tablet 0  . clopidogrel (PLAVIX) 75 MG tablet TAKE 1 TABLET BY MOUTH EVERY DAY 90 tablet 2  . NEXIUM 24HR 20 MG capsule TAKE 1 CAPSULE BY MOUTH DAILY AT 12 NOON 84 capsule 4  . silodosin (RAPAFLO) 4 MG CAPS capsule Take 4 mg by mouth every evening.      No current facility-administered medications for this visit.     Past Medical History:  Diagnosis Date  . AAA (abdominal aortic aneurysm) ([place])   . Anemia   . Aortic stenosis   . Arthritis 09/18/11   "in my knees"  . Basal cell carcinoma of skin   . Blood transfusion   . BPH (benign prostatic hyperplasia)   . CAD (coronary artery disease)    Remote stent to LAD in 1999. Last cath in 2004 and he is managed medically  . Colon polyps   . Complication of anesthesia   . Dysrhythmia    paced  . GERD (gastroesophageal reflux disease)   . Hemorrhoid   . HTN (hypertension)   . Hyperlipidemia   . Lung nodule    RLL  . 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 Renal mass, left   . 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   26 mm Edwards Sapien 3 transcatheter heart valve placed via open left transfemoral approach     ROS:   All systems reviewed and negative except as noted in the HPI.   Past Surgical History:  Procedure Laterality Date  . CARDIAC CATHETERIZATION  2004   Managed medically  . CARDIAC CATHETERIZATION    . CARDIAC CATHETERIZATION N/A 04/11/2015   Procedure: Right/Left Heart Cath and Coronary Angiography;  Surgeon: Burnell Blanks, MD;  Location: [facility];  Service: Cardiovascular;  Laterality: N/A;  . CORONARY ANGIOGRAM  09/19/2011   Procedure: CORONARY ANGIOGRAM;  Surgeon: Josue Hector, MD;  Location: [facility] CATH LAB;  Service: Cardiovascular;;  . CORONARY ANGIOPLASTY WITH STENT PLACEMENT  05/09/1998   "1"; LAD  . INSERT / REPLACE / REMOVE PACEMAKER  2004   initial placement; [employer]  . INSERT / REPLACE / REMOVE PACEMAKER  02/14/2011  . NASAL HEMORRHAGE CONTROL  1980's   Clips placed to stop bleeding  . SKIN CANCER EXCISION  09/18/11   "have had a couple hundred of them removed since I was in my 40's"  . TEE WITHOUT CARDIOVERSION N/A 05/15/2015   Procedure: TRANSESOPHAGEAL ECHOCARDIOGRAM (TEE);  Surgeon: Burnell Blanks, MD;  Location: [facility];  Service: Open Heart Surgery;  Laterality: N/A;  .  TONSILLECTOMY     "when I was a teenager"  . TRANSCATHETER AORTIC VALVE REPLACEMENT, TRANSFEMORAL N/A 05/15/2015   Procedure: TRANSCATHETER AORTIC VALVE REPLACEMENT, TRANSFEMORAL;  Surgeon: Burnell Blanks, MD;  Location: [facility];  Service: Open Heart Surgery;  Laterality: N/A;     Family History  Problem Relation Age of Onset  . Cancer Father        stomach  . Colon cancer Father   . Heart disease Brother   . Heart disease Brother   . Diabetes Other        granddaughter     Social History   Socioeconomic History  . Marital status: Married    Spouse name: Not on file  . Number of children: 2  . Years of education: Not on file  . Highest education level: Not on file  Occupational History  . Occupation: Retired    Comment: Building control surveyor  Tobacco Use  . Smoking  status: Former Smoker    Packs/day: 1.00    Years: 42.00    Pack years: 42.00    Types: Cigarettes    Quit date: 08/18/1974    Years since quitting: 45.0  . Smokeless tobacco: Former Systems developer    Types: Chew  Substance and Sexual Activity  . Alcohol use: No    Alcohol/week: 0.0 standard drinks  . Drug use: No  . Sexual activity: Yes  Other Topics Concern  . Not on file  Social History Narrative  . Not on file   Social Determinants of Health   Financial Resource Strain:   . Difficulty of Paying Living Expenses: Not on file  Food Insecurity:   . Worried About Charity fundraiser in the Last Year: Not on file  . Ran Out of Food in the Last Year: Not on file  Transportation Needs:   . Lack of Transportation (Medical): Not on file  . Lack of Transportation (Non-Medical): Not on file  Physical Activity:   . Days of Exercise per Week: Not on file  . Minutes of Exercise per Session: Not on file  Stress:   . Feeling of Stress : Not on file  Social Connections:   . Frequency of Communication with Friends and Family: Not on file  . Frequency of Social Gatherings with Friends and Family: Not on file  . Attends Religious Services: Not on file  . Active Member of Clubs or Organizations: Not on file  . Attends Archivist Meetings: Not on file  . Marital Status: Not on file  Intimate Partner Violence:   . Fear of Current or Ex-Partner: Not on file  . Emotionally Abused: Not on file  . Physically Abused: Not on file  . Sexually Abused: Not on file     BP 132/70   Pulse (!) 106   Ht 5\' 2"  (1.575 m)   Wt 162 lb 3.2 oz (73.6 kg)   SpO2 96%   BMI 29.67 kg/m   Physical Exam:  Well appearing NAD HEENT: Unremarkable Neck:  No JVD, no thyromegally Lymphatics:  No adenopathy Back:  No CVA tenderness Lungs:  Clear with no wheezes HEART:  Regular rate rhythm, 2/6 systolic murmurs, no rubs, no clicks Abd:  soft, positive bowel sounds, no organomegally, no rebound, no guarding  Ext:  2 plus pulses, no edema, no cyanosis, no clubbing Skin:  No rashes no nodules Neuro:  CN II through XII intact, motor grossly intact  EKG - NSR with ventricular pacing  DEVICE  Normal device  function.  See PaceArt for details.   Assess/Plan: 1. PPM - his medtronic DDD PM is working normally. We will recheck in several months.  2.Aortic stenosis - he has evidence of this on exam, despite prior TAVR. However he is asymptomatic and for this reason and his advanced age, we will not repeat his echo. 3. Diastolic heart failure - he has class 2 symptoms. I have asked him to avoid salty foods.   Mikle Bosworth.D.

## 2019-09-24 ENCOUNTER — Emergency Department (HOSPITAL_COMMUNITY): Payer: Medicare Other

## 2019-09-24 ENCOUNTER — Observation Stay (HOSPITAL_COMMUNITY)
Admission: EM | Admit: 2019-09-24 | Discharge: 2019-09-25 | Disposition: A | Discharge status: Home / Self Care | Payer: Medicare Other | Attending: Internal Medicine | Admitting: Internal Medicine

## 2019-09-24 DIAGNOSIS — E785 Hyperlipidemia, unspecified: Secondary | ICD-10-CM | POA: Insufficient documentation

## 2019-09-24 DIAGNOSIS — R4182 Altered mental status, unspecified: Secondary | ICD-10-CM

## 2019-09-24 DIAGNOSIS — Z20822 Contact with and (suspected) exposure to covid-19: Secondary | ICD-10-CM | POA: Insufficient documentation

## 2019-09-24 DIAGNOSIS — Z87891 Personal history of nicotine dependence: Secondary | ICD-10-CM | POA: Diagnosis not present

## 2019-09-24 DIAGNOSIS — R262 Difficulty in walking, not elsewhere classified: Secondary | ICD-10-CM | POA: Diagnosis not present

## 2019-09-24 DIAGNOSIS — I714 Abdominal aortic aneurysm, without rupture: Secondary | ICD-10-CM | POA: Diagnosis not present

## 2019-09-24 DIAGNOSIS — I2510 Atherosclerotic heart disease of native coronary artery without angina pectoris: Secondary | ICD-10-CM | POA: Insufficient documentation

## 2019-09-24 DIAGNOSIS — Z886 Allergy status to analgesic agent status: Secondary | ICD-10-CM | POA: Insufficient documentation

## 2019-09-24 DIAGNOSIS — Z888 Allergy status to other drugs, medicaments and biological substances status: Secondary | ICD-10-CM | POA: Insufficient documentation

## 2019-09-24 DIAGNOSIS — Z9861 Coronary angioplasty status: Secondary | ICD-10-CM | POA: Diagnosis not present

## 2019-09-24 DIAGNOSIS — R55 Syncope and collapse: Principal | ICD-10-CM | POA: Insufficient documentation

## 2019-09-24 DIAGNOSIS — Z79899 Other long term (current) drug therapy: Secondary | ICD-10-CM | POA: Insufficient documentation

## 2019-09-24 DIAGNOSIS — I10 Essential (primary) hypertension: Secondary | ICD-10-CM | POA: Diagnosis not present

## 2019-09-24 DIAGNOSIS — I498 Other specified cardiac arrhythmias: Secondary | ICD-10-CM | POA: Diagnosis not present

## 2019-09-24 DIAGNOSIS — R778 Other specified abnormalities of plasma proteins: Secondary | ICD-10-CM

## 2019-09-24 DIAGNOSIS — Z7902 Long term (current) use of antithrombotics/antiplatelets: Secondary | ICD-10-CM | POA: Insufficient documentation

## 2019-09-24 DIAGNOSIS — M6281 Muscle weakness (generalized): Secondary | ICD-10-CM | POA: Insufficient documentation

## 2019-09-24 DIAGNOSIS — R2681 Unsteadiness on feet: Secondary | ICD-10-CM | POA: Insufficient documentation

## 2019-09-24 LAB — COMPREHENSIVE METABOLIC PANEL
ALT: 20 U/L (ref 0–44)
AST: 41 U/L (ref 15–41)
Albumin: 3.3 g/dL — ABNORMAL LOW (ref 3.5–5.0)
Alkaline Phosphatase: 49 U/L (ref 38–126)
Anion gap: 11 (ref 5–15)
BUN: 24 mg/dL — ABNORMAL HIGH (ref 8–23)
CO2: 25 mmol/L (ref 22–32)
Calcium: 9.1 mg/dL (ref 8.9–10.3)
Chloride: 100 mmol/L (ref 98–111)
Creatinine, Ser: 1.36 mg/dL — ABNORMAL HIGH (ref 0.61–1.24)
GFR calc Af Amer: 49 mL/min — ABNORMAL LOW (ref >60)
GFR calc non Af Amer: 43 mL/min — ABNORMAL LOW (ref >60)
Glucose, Bld: 113 mg/dL — ABNORMAL HIGH (ref 70–99)
Potassium: 4.2 mmol/L (ref 3.5–5.1)
Sodium: 136 mmol/L (ref 135–145)
Total Bilirubin: 0.8 mg/dL (ref 0.3–1.2)
Total Protein: 7.7 g/dL (ref 6.5–8.1)

## 2019-09-24 LAB — TROPONIN I (HIGH SENSITIVITY)
Troponin I (High Sensitivity): 2562 ng/L (ref <18)
Troponin I (High Sensitivity): 2467 ng/L (ref <18)
Troponin I (High Sensitivity): 2801 ng/L (ref <18)
Troponin I (High Sensitivity): 2813 ng/L (ref <18)
Troponin I (High Sensitivity): 3044 ng/L (ref <18)

## 2019-09-24 LAB — URINALYSIS, ROUTINE W REFLEX MICROSCOPIC
Bacteria, UA: NONE SEEN
Bilirubin Urine: NEGATIVE
Glucose, UA: NEGATIVE mg/dL
Ketones, ur: 5 mg/dL — AB
Leukocytes,Ua: NEGATIVE
Nitrite: NEGATIVE
Protein, ur: 100 mg/dL — AB
Specific Gravity, Urine: 1.017 (ref 1.005–1.030)
pH: 6 (ref 5.0–8.0)

## 2019-09-24 LAB — CBC WITH DIFFERENTIAL/PLATELET
Abs Immature Granulocytes: 0.03 10*3/uL (ref 0.00–0.07)
Basophils Absolute: 0 10*3/uL (ref 0.0–0.1)
Basophils Relative: 0 %
Eosinophils Absolute: 0 10*3/uL (ref 0.0–0.5)
Eosinophils Relative: 0 %
HCT: 40.3 % (ref 39.0–52.0)
Hemoglobin: 12.7 g/dL — ABNORMAL LOW (ref 13.0–17.0)
Immature Granulocytes: 0 %
Lymphocytes Relative: 27 %
Lymphs Abs: 2.7 10*3/uL (ref 0.7–4.0)
MCH: 29.5 pg (ref 26.0–34.0)
MCHC: 31.5 g/dL (ref 30.0–36.0)
MCV: 93.5 fL (ref 80.0–100.0)
Monocytes Absolute: 0.8 10*3/uL (ref 0.1–1.0)
Monocytes Relative: 8 %
Neutro Abs: 6.5 10*3/uL (ref 1.7–7.7)
Neutrophils Relative %: 65 %
Platelets: 147 10*3/uL — ABNORMAL LOW (ref 150–400)
RBC: 4.31 MIL/uL (ref 4.22–5.81)
RDW: 12.7 % (ref 11.5–15.5)
WBC: 10.1 10*3/uL (ref 4.0–10.5)
nRBC: 0 % (ref 0.0–0.2)

## 2019-09-24 LAB — PROTIME-INR
INR: 1.1 (ref 0.8–1.2)
Prothrombin Time: 13.6 seconds (ref 11.4–15.2)

## 2019-09-24 LAB — CK: Total CK: 337 U/L (ref 49–397)

## 2019-09-24 MED ORDER — ENOXAPARIN SODIUM 40 MG/0.4ML ~~LOC~~ SOLN
40.0000 mg | SUBCUTANEOUS | Status: DC
  Administered 2019-09-24: 40 mg via SUBCUTANEOUS
  Filled 2019-09-24: qty 0.4

## 2019-09-24 MED ORDER — ACETAMINOPHEN 325 MG PO TABS: 650.0000 mg | ORAL_TABLET | Freq: Four times a day (QID) | ORAL | Status: DC | PRN

## 2019-09-24 MED ORDER — ATORVASTATIN CALCIUM 10 MG PO TABS
10.0000 mg | ORAL_TABLET | Freq: Every day | ORAL | Status: DC
  Administered 2019-09-25: 10 mg via ORAL
  Filled 2019-09-24: qty 1

## 2019-09-24 MED ORDER — TAMSULOSIN HCL 0.4 MG PO CAPS
0.4000 mg | ORAL_CAPSULE | Freq: Every day | ORAL | Status: DC
  Administered 2019-09-25: 0.4 mg via ORAL
  Filled 2019-09-24: qty 1

## 2019-09-24 MED ORDER — PANTOPRAZOLE SODIUM 40 MG PO TBEC
40.0000 mg | DELAYED_RELEASE_TABLET | Freq: Every day | ORAL | Status: DC
  Administered 2019-09-24 – 2019-09-25 (×2): 40 mg via ORAL
  Filled 2019-09-24 (×2): qty 1

## 2019-09-24 MED ORDER — CLOPIDOGREL BISULFATE 75 MG PO TABS
75.0000 mg | ORAL_TABLET | Freq: Every day | ORAL | Status: DC
  Administered 2019-09-25: 75 mg via ORAL
  Filled 2019-09-24: qty 1

## 2019-09-24 MED ORDER — SODIUM CHLORIDE 0.9% FLUSH
3.0000 mL | Freq: Two times a day (BID) | INTRAVENOUS | Status: DC
  Administered 2019-09-25: 3 mL via INTRAVENOUS

## 2019-09-24 MED ORDER — SODIUM CHLORIDE 0.9 % IV BOLUS
1000.0000 mL | Freq: Once | INTRAVENOUS | Status: AC
  Administered 2019-09-24: 1000 mL via INTRAVENOUS

## 2019-09-24 NOTE — H&P (Signed)
Date: 09/24/2019               Patient Name:  Jordan [name] MRN: [id number]  DOB: 24-Apr-1918 Age / Sex: 86 y.o., male   PCP: Leanna Battles, MD         Medical Service: Internal Medicine Teaching Service         Attending Physician: Dr. Aldine Contes, MD    First Contact: Dr. Ladona Horns Pager: [phone]  Second Contact: Dr. Modena Nunnery Pager: [phone]       After Hours (After 5p/  First Contact Pager: [phone]  weekends / holidays): Second Contact Pager: [phone]   Chief Complaint: syncope   History of Present Illness: Mr. Blohm is a 86 y/o gentleman with history of AS s/p TAVR, CHB s/p PPM, dyslipidemia, HTN who presents following an unwitnessed syncopal event at home. History provided per patient at the bedside. He states he was in his usual state of health when going to bed last night. Pt awoke in the middle of the night to use the bathroom. He states he was unable to see in the room or get his bearing with the lights out, so he tried crawling on the ground looking for the light. In this process, he states he accidentally knocked a lamp over on his head and multiple items in the room (a chair and picture frames). He believes that this process lasted a couple of hours overnight. During this time he believes he had a bowel movement because he thought he was in the bathroom. When it was lighter in the morning, pt was able to get to a phone and call his son. The son subsequently brought him to the emergency room after seeing the state of his dad's house. Pt denies any dizziness/lightheadedness, vision changes, chest pain, shortness of breath, nausea/vomiting, abdominal pain, or loss of consciousness throughout this episode. Pt takes clopidogrel at home, and is on no other blood thinners.  In the ED, pt was unsure why he was in the emergency room. He was afebrile, HR 78, BP 150/94, RR 22, and O2 saturation 96 on RA. Work-up remarkable for negative CT head. Troponin elevated  to 2,562 though flat on repeat at 2,467. CBC with WBC 10.1, Hgb 12.7, and plts 147. BMP with Na 136, K 4.2, bicarb 25, Cr 1.36, BUN 24, and Glc 113. PT and INR normal. UA unremarkable. Internal medicine called to trend troponin.   Meds: Current Meds  Medication Sig  . acetaminophen (TYLENOL) 325 MG tablet Take 650 mg by mouth every 6 (six) hours as needed for mild pain, moderate pain or fever.   Marland Kitchen amLODipine (NORVASC) 5 MG tablet TAKE 1 TABLET BY MOUTH EVERY DAY (Patient taking differently: Take 5 mg by mouth daily. )  . atorvastatin (LIPITOR) 10 MG tablet Take 1 tablet (10 mg total) by mouth daily.  . clopidogrel (PLAVIX) 75 MG tablet TAKE 1 TABLET BY MOUTH EVERY DAY (Patient taking differently: Take 75 mg by mouth daily. )  . silodosin (RAPAFLO) 4 MG CAPS capsule Take 4 mg by mouth every evening.    Allergies: Allergies as of 09/24/2019 - Review Complete 09/24/2019  Allergen Reaction Noted  . Aspirin Other (See Comments) 06/21/2008  . Nitroglycerin  05/10/2015   Past Medical History:  Diagnosis Date  . AAA (abdominal aortic aneurysm) ([place])   . Anemia   . Aortic stenosis   . Arthritis 09/18/11   "in my knees"  . Basal cell carcinoma of skin   .  Blood transfusion   . BPH (benign prostatic hyperplasia)   . CAD (coronary artery disease)    Remote stent to LAD in 1999. Last cath in 2004 and he is managed medically  . Colon polyps   . Complication of anesthesia   . Dysrhythmia    paced  . GERD (gastroesophageal reflux disease)   . Hemorrhoid   . HTN (hypertension)   . Hyperlipidemia   . Lung nodule    RLL  . 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 Renal mass, left   . 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   26 mm Edwards Sapien 3 transcatheter heart valve placed via open left transfemoral approach    Family History:  No family history of kidney,   Social History:  Pt lives alone at home with his bird, Pretty Boy. He is able to perform his  own ADLs including driving, going to grocery store, cooking meals, and cleaning. He allows someone else to mow his lawn. He manages his own medications and utilizes a pill Chief Executive Officer at home. His son lives in [place].   Previous smoker quitting in the 1970s. Denies any EtOH use.  Review of Systems: Review of Systems  Constitutional: Negative for chills and fever.  HENT: Negative for congestion and sore throat.   Eyes: Positive for blurred vision. Negative for double vision.  Respiratory: Negative for cough and shortness of breath.   Cardiovascular: Negative for chest pain, palpitations and leg swelling.  Gastrointestinal: Negative for abdominal pain, constipation, diarrhea, nausea and vomiting.  Genitourinary: Negative for dysuria and frequency.  Musculoskeletal: Positive for falls. Negative for back pain and neck pain.  Skin: Negative.   Neurological: Negative for dizziness, focal weakness and headaches.   Physical Exam: Blood pressure 126/82, pulse 81, temperature 98 F (36.7 C), temperature source Oral, resp. rate 19, height 5\' 2"  (1.575 m), weight 73.6 kg, SpO2 95 %. Physical Exam Vitals and nursing note reviewed.  Constitutional:      General: He is not in acute distress.    Appearance: He is ill-appearing.  HENT:     Head: Normocephalic.     Comments: Hematoma over top left side of head.     Mouth/Throat:     Mouth: Mucous membranes are moist.  Eyes:     General:        Left eye: Discharge (w/ associated erythema on lower lash line) present. Cardiovascular:     Rate and Rhythm: Normal rate and regular rhythm.     Heart sounds: Murmur present.  Pulmonary:     Effort: Pulmonary effort is normal. No respiratory distress.     Breath sounds: Normal breath sounds.  Abdominal:     General: Abdomen is flat. Bowel sounds are normal.     Palpations: Abdomen is soft.  Musculoskeletal:        General: No tenderness or deformity.     Right lower leg: No edema.     Left  lower leg: No edema.  Skin:    General: Skin is warm and dry.     Comments: Age related skin changes and multiple senile ecchymosis   Neurological:     General: No focal deficit present.     Mental Status: He is alert and oriented to person, place, and time.  Psychiatric:        Mood and Affect: Mood normal.    EKG: personally reviewed my interpretation is normal rate, ventricular paced rhythm  CXR: personally reviewed my interpretation is mild vascular congestion, left-sided pleural effusion, and patchy airspace opacities. Pacemaker and valve replacement noted.  Assessment & Plan by Problem: Active Problems:   Syncope  Mr. Opheim is a 86 y/o gentleman with history of AS s/p TAVR, CHB s/p PPM, dyslipidemia, HTN who presents following a fall vs unwitnessed syncopal event at home and ?altered mental status.  Syncope vs fall and subsequent AMS Unclear history due to unwitnessed events overnight, but it appears that pt was unable to get to the bathroom overnight due to the dark room. Does not sound like a syncopal event due to pt now having recollection of the events and he denies loss of consciousness. Son and ED providers initially reported altered mental status. Work-up thus far unremarkable. CT head negative for trauma or acute bleeds. Lab work without metabolic reasons of AMS - Na 136, Glc 113, bicarb 25, and BUN 24. CK normal at 337. UA without evidence of infection, no nitrites/leukocytes or bacteria. Pt with a pacemaker and interrogation without events. Seizure activity seems unlikely given pt's recollection of events. Suspect orthostatic vs vasovagal syncope or ?fall out of head. - will continue to monitor overnight - orthostatic vitals - repeat echo given aortic stenosis history - morning EKG - PT/OT eval  Elevated troponin Troponin elevated to 2,562 on admission though flat on repeat at 2,467. Pt denies chest pain overnight or currently in the ED. EKG with normal stable and  stable ventricular pacing, no ischemic changes. Case discussed with Dr. Percival Spanish of cardiology, per ED provider, who does not recommend heparin at this time and instead to trend troponins - trend troponin q2h - tele bed - officially consult cards if uptrending  AKI Cr on admission 1.36, with prior baseline 4 years ago 1.11. UA significant for small Hgb, 5 ketones, 100 protein, negative nitrites/leukocytes, 0-5 RBC, 0-5 WBC, and no bacteria. - s/p 1L NS bolus in the ED - follow-up morning BMP  Aortic stenosis s/p TAVR Pt last seen by cardiology on 08/2019. Pt with evidence of AS despite TAVR, though given he was asymptomatic and advance age no further work-up was pursued. Last echo in 2017 with 89mm Sapien 3 valve in excellent position with no perivalvular regurgitation and stable gradients.   HTN/hyperlipidemia - holding home amlodipine - continue home atorvastatin  GERD - continue pantoprazole 40mg  daily  Diet - heart healthy Fluids - none DVT ppx - enoxaparin 40mg  subQ daily CODE STATUS - FULL  Dispo: Admit patient to Observation with expected length of stay less than 2 midnights.  Signed: Ladona Horns, MD 09/24/2019, 6:44 PM  Pager: [phone]

## 2019-09-24 NOTE — ED Notes (Signed)
Date and time results received: 09/24/19  Test: Troponin Critical Value: 2,562  Name of Provider Notified: Vanita Panda

## 2019-09-24 NOTE — ED Notes (Signed)
Pacemaker integrated

## 2019-09-24 NOTE — ED Provider Notes (Signed)
Care assumed from [facility], [place] at shift change with labs pending.  In brief, this patient is a 86 y.o. M presented for evaluation of unwitnessed fall and altered mental status.  Most of history is presented by son who stated that he last talked to his dad yesterday and was at his baseline.  Son called dad this morning and stated that patient was disoriented and did not sound right.  He came over and found a lamp on the floor and that patient had defecated on his chair.  Additionally, he appeared to have a hematoma noted to his skull.  Patient did not recall what had happened.  Son stated that dad was confused which is not his baseline.  He is currently on Plavix.  Please see history/physical from previous provider.   Physical Exam  BP (!) 146/84   Pulse 78   Temp 98 F (36.7 C) (Oral)   Resp (!) 28   Ht 5\' 2"  (1.575 m)   Wt 73.6 kg Comment: per record  SpO2 93%   BMI 29.67 kg/m   Physical Exam  Hematoma noted to the left scalp.  No underlying skull deformity or crepitus noted.  ED Course/Procedures     Procedures   Results for orders placed or performed during the hospital encounter of 09/24/19 (from the past 24 hour(s))  CBC with Differential     Status: Abnormal   Collection Time: 09/24/19  2:05 PM  Result Value Ref Range   WBC 10.1 4.0 - 10.5 K/uL   RBC 4.31 4.22 - 5.81 MIL/uL   Hemoglobin 12.7 (L) 13.0 - 17.0 g/dL   HCT 40.3 39.0 - 52.0 %   MCV 93.5 80.0 - 100.0 fL   MCH 29.5 26.0 - 34.0 pg   MCHC 31.5 30.0 - 36.0 g/dL   RDW 12.7 11.5 - 15.5 %   Platelets 147 (L) 150 - 400 K/uL   nRBC 0.0 0.0 - 0.2 %   Neutrophils Relative % 65 %   Neutro Abs 6.5 1.7 - 7.7 K/uL   Lymphocytes Relative 27 %   Lymphs Abs 2.7 0.7 - 4.0 K/uL   Monocytes Relative 8 %   Monocytes Absolute 0.8 0.1 - 1.0 K/uL   Eosinophils Relative 0 %   Eosinophils Absolute 0.0 0.0 - 0.5 K/uL   Basophils Relative 0 %   Basophils Absolute 0.0 0.0 - 0.1 K/uL   Immature Granulocytes 0 %   Abs Immature  Granulocytes 0.03 0.00 - 0.07 K/uL  Comprehensive metabolic panel     Status: Abnormal   Collection Time: 09/24/19  2:05 PM  Result Value Ref Range   Sodium 136 135 - 145 mmol/L   Potassium 4.2 3.5 - 5.1 mmol/L   Chloride 100 98 - 111 mmol/L   CO2 25 22 - 32 mmol/L   Glucose, Bld 113 (H) 70 - 99 mg/dL   BUN 24 (H) 8 - 23 mg/dL   Creatinine, Ser 1.36 (H) 0.61 - 1.24 mg/dL   Calcium 9.1 8.9 - 10.3 mg/dL   Total Protein 7.7 6.5 - 8.1 g/dL   Albumin 3.3 (L) 3.5 - 5.0 g/dL   AST 41 15 - 41 U/L   ALT 20 0 - 44 U/L   Alkaline Phosphatase 49 38 - 126 U/L   Total Bilirubin 0.8 0.3 - 1.2 mg/dL   GFR calc non Af Amer 43 (L) >60 mL/min   GFR calc Af Amer 49 (L) >60 mL/min   Anion gap 11 5 -  15  Protime-INR     Status: None   Collection Time: 09/24/19  2:05 PM  Result Value Ref Range   Prothrombin Time 13.6 11.4 - 15.2 seconds   INR 1.1 0.8 - 1.2  Troponin I (High Sensitivity)     Status: Abnormal   Collection Time: 09/24/19  2:05 PM  Result Value Ref Range   Troponin I (High Sensitivity) 2,562 (HH) <18 ng/L  Urinalysis, Routine w reflex microscopic     Status: Abnormal   Collection Time: 09/24/19  4:12 PM  Result Value Ref Range   Color, Urine YELLOW YELLOW   APPearance CLEAR CLEAR   Specific Gravity, Urine 1.017 1.005 - 1.030   pH 6.0 5.0 - 8.0   Glucose, UA NEGATIVE NEGATIVE mg/dL   Hgb urine dipstick SMALL (A) NEGATIVE   Bilirubin Urine NEGATIVE NEGATIVE   Ketones, ur 5 (A) NEGATIVE mg/dL   Protein, ur 100 (A) NEGATIVE mg/dL   Nitrite NEGATIVE NEGATIVE   Leukocytes,Ua NEGATIVE NEGATIVE   RBC / HPF 0-5 0 - 5 RBC/hpf   WBC, UA 0-5 0 - 5 WBC/hpf   Bacteria, UA NONE SEEN NONE SEEN   Mucus PRESENT   Troponin I (High Sensitivity)     Status: Abnormal   Collection Time: 09/24/19  4:34 PM  Result Value Ref Range   Troponin I (High Sensitivity) 2,467 (HH) <18 ng/L  CK     Status: None   Collection Time: 09/24/19  4:34 PM  Result Value Ref Range   Total CK 337 49 - 397 U/L   Troponin I (High Sensitivity)     Status: Abnormal   Collection Time: 09/24/19  7:02 PM  Result Value Ref Range   Troponin I (High Sensitivity) 2,801 (HH) <18 ng/L     MDM    PLAN: Patient pending labs.  Given altered mental status, will likely require admission.  MDM:  UA does not show any infectious etiology.  CMP shows BUN of 24, creatinine of 1.36.  Lance Bosch is elevated at 2,562.  CBC shows no leukocytosis.  Hemoglobin stable at 12.7.  We will plan to interrogate his pacemaker.  Discussed patient with Dr. Percival Spanish (cardiology). Does not recommend heparin at this time. Recommends trending troponin. Consult cards as needed.   I reviewed his pacemaker interrogation, which showed no events.   Discussed with internal medicine who is agreeable for admission.   Updated patient and patient's son on plan. He is agreeable.   Portions of this note were generated with [employer]. Dictation errors may occur despite best attempts at proofreading.   1. Altered mental status, unspecified altered mental status type   2. Elevated troponin     Portions of this note were generated with [employer]. Dictation errors may occur despite best attempts at proofreading.     Desma Mcgregor 09/24/19 Liam Graham, MD 09/24/19 2330

## 2019-09-24 NOTE — ED Notes (Signed)
When doing orthostatics, pt denied any state of dizziness or lightheadness. Pt. was stable on his feet, however his pressure did drop.

## 2019-09-24 NOTE — ED Notes (Signed)
Received call from [employer] r/t pace maker report, informed pt is pacemaker dependent- has a dual chamber pacemaker- no issues with device and battery is charged.

## 2019-09-24 NOTE — ED Notes (Signed)
Report attempted 

## 2019-09-24 NOTE — ED Triage Notes (Signed)
Pt to ED via EMS from home. Pt lives alone, usually alert and oriented/ cares for himself. Family spoke with him yesterday at 1pm and pt was acting himself. Today around 1000 family called patient and they were concerned for his well being due to he sounded "not himself" They went to check on him and pt was in a chair, questionable fall? Lamp on floor, picture frames on floor. Small abrasion to pt head. Pt ambulatory with assist . Pt takes Plavix. Pt does not recall event,  But is A&O X 4 per EMS. HX pacemaker.Pt is HOH. No medications given by EMS. Last VS: 160/86, HR 90, 98% RA. cbg 129. Temp 98.7.

## 2019-09-24 NOTE — ED Notes (Signed)
Pt unable to void at this time. 

## 2019-09-24 NOTE — ED Notes (Signed)
hospitalist at bedside

## 2019-09-24 NOTE — ED Notes (Signed)
[employer] reports given to EDP

## 2019-09-24 NOTE — ED Provider Notes (Signed)
[facility] EMERGENCY DEPARTMENT Provider Note   CSN: [id number] Arrival date & time: 09/24/19  1345     History Chief Complaint  Patient presents with  . Fall    Jordan [name] is a 86 y.o. male.  86 y.o male with a PMH of AAA, S/P TAVR, GERD, HTN presents to the ED via EMS s/p unwitnessed fall. According to EMS report patient's son called after he had found him at home. Patient is unsure on why he is here and what all occurred prior to arrival. Level 5 caveat.   The history is provided by the patient and medical records.  Fall Pertinent negatives include no chest pain, no abdominal pain and no shortness of breath.       Past Medical History:  Diagnosis Date  . AAA (abdominal aortic aneurysm) ([place])   . Anemia   . Aortic stenosis   . Arthritis 09/18/11   "in my knees"  . Basal cell carcinoma of skin   . Blood transfusion   . BPH (benign prostatic hyperplasia)   . CAD (coronary artery disease)    Remote stent to LAD in 1999. Last cath in 2004 and he is managed medically  . Colon polyps   . Complication of anesthesia   . Dysrhythmia    paced  . GERD (gastroesophageal reflux disease)   . Hemorrhoid   . HTN (hypertension)   . Hyperlipidemia   . Lung nodule    RLL  . 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 Renal mass, left   . 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 AAA (abdominal aortic aneurysm) without rupture ([place]) 08/14/2015  . Severe aortic valve stenosis 05/15/2015  . 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  . Aortic stenosis, severe 05/03/2015  . Chest pain 03/09/2015  . Abdominal aneurysm without mention of rupture 02/24/2013  . Pulmonary nodule, right 02/10/2013  . Mobitz type II atrioventricular block 03/09/2012  . Effusion of  olecranon bursa, left 01/28/2012  . Aortic stenosis 05/06/2011  . Cardiac pacemaker in situ 10/29/2010  . ABDOMINAL PAIN-LUQ 06/21/2008  . PERSONAL HX COLONIC POLYPS 06/21/2008  . HYPERLIPIDEMIA 06/19/2008  . ANEMIA 06/19/2008  . Coronary atherosclerosis 06/19/2008    Past Surgical History:  Procedure Laterality Date  . CARDIAC CATHETERIZATION  2004   Managed medically  . CARDIAC CATHETERIZATION    . CARDIAC CATHETERIZATION N/A 04/11/2015   Procedure: Right/Left Heart Cath and Coronary Angiography;  Surgeon: Burnell Blanks, MD;  Location: [facility] CV LAB;  Service: Cardiovascular;  Laterality: N/A;  . CORONARY ANGIOGRAM  09/19/2011   Procedure: CORONARY ANGIOGRAM;  Surgeon: Josue Hector, MD;  Location: [facility] CATH LAB;  Service: Cardiovascular;;  . CORONARY ANGIOPLASTY WITH STENT PLACEMENT  05/09/1998   "1"; LAD  . INSERT / REPLACE / REMOVE PACEMAKER  2004   initial placement; [employer]  . INSERT / REPLACE / REMOVE PACEMAKER  02/14/2011  . NASAL HEMORRHAGE CONTROL  1980's   Clips placed to stop bleeding  . SKIN CANCER EXCISION  09/18/11   "have had a couple hundred of them removed since I was in my 40's"  . TEE WITHOUT CARDIOVERSION N/A 05/15/2015   Procedure: TRANSESOPHAGEAL ECHOCARDIOGRAM (TEE);  Surgeon: Burnell Blanks, MD;  Location: [facility];  Service: Open  Heart Surgery;  Laterality: N/A;  . TONSILLECTOMY     "when I was a teenager"  . TRANSCATHETER AORTIC VALVE REPLACEMENT, TRANSFEMORAL N/A 05/15/2015   Procedure: TRANSCATHETER AORTIC VALVE REPLACEMENT, TRANSFEMORAL;  Surgeon: Burnell Blanks, MD;  Location: [facility];  Service: Open Heart Surgery;  Laterality: N/A;       Family History  Problem Relation Age of Onset  . Cancer Father        stomach  . Colon cancer Father   . Heart disease Brother   . Heart disease Brother   . Diabetes Other        granddaughter    Social History   Tobacco Use  . Smoking status: Former Smoker    Packs/day: 1.00     Years: 42.00    Pack years: 42.00    Types: Cigarettes    Quit date: 08/18/1974    Years since quitting: 45.1  . Smokeless tobacco: Former Systems developer    Types: Chew  Substance Use Topics  . Alcohol use: No    Alcohol/week: 0.0 standard drinks  . Drug use: No    Home Medications Prior to Admission medications   Medication Sig Start Date End Date Taking? Authorizing Provider  acetaminophen (TYLENOL) 325 MG tablet Take 650 mg by mouth every 6 (six) hours as needed for mild pain, moderate pain or fever.    Yes [provider]  amLODipine (NORVASC) 5 MG tablet TAKE 1 TABLET BY MOUTH EVERY DAY Patient taking differently: Take 5 mg by mouth daily.  08/22/19  Yes Martinique, Peter M, MD  atorvastatin (LIPITOR) 10 MG tablet Take 1 tablet (10 mg total) by mouth daily. 08/22/19  Yes Martinique, Peter M, MD  clopidogrel (PLAVIX) 75 MG tablet TAKE 1 TABLET BY MOUTH EVERY DAY Patient taking differently: Take 75 mg by mouth daily.  07/12/19  Yes Martinique, Peter M, MD  silodosin (RAPAFLO) 4 MG CAPS capsule Take 4 mg by mouth every evening.    Yes [provider]  NEXIUM 24HR 20 MG capsule TAKE 1 CAPSULE BY MOUTH DAILY AT 12 NOON Patient not taking: Reported on 09/24/2019 08/22/19   Martinique, Peter M, MD    Allergies    Aspirin and Nitroglycerin  Review of Systems   Review of Systems  Unable to perform ROS: Mental status change  Constitutional: Negative for fever.  HENT: Negative for sore throat.   Respiratory: Negative for shortness of breath.   Cardiovascular: Negative for chest pain.  Gastrointestinal: Negative for abdominal pain, nausea and vomiting.  Genitourinary: Negative for flank pain.  Musculoskeletal: Negative for back pain.  Skin: Positive for wound.    Physical Exam Updated Vital Signs BP (!) 150/94   Pulse 78   Temp 98 F (36.7 C) (Oral)   Resp (!) 22   Ht 5\' 2"  (1.575 m)   Wt 73.6 kg Comment: per record  SpO2 96%   BMI 29.67 kg/m   Physical Exam Vitals and nursing  note reviewed.  Constitutional:      General: He is not in acute distress.    Appearance: He is not toxic-appearing.  HENT:     Head: Normocephalic.      Mouth/Throat:     Mouth: Mucous membranes are moist.  Eyes:     Pupils: Pupils are equal, round, and reactive to light.  Cardiovascular:     Rate and Rhythm: Normal rate.  Pulmonary:     Effort: Pulmonary effort is normal.     Breath sounds:  No wheezing, rhonchi or rales.  Abdominal:     General: Abdomen is flat. There is no distension.     Palpations: Abdomen is soft.     Tenderness: There is no abdominal tenderness. There is no right CVA tenderness, left CVA tenderness, guarding or rebound.  Musculoskeletal:     Cervical back: Normal range of motion and neck supple.  Skin:    General: Skin is warm and dry.     Findings: Bruising and erythema present.  Neurological:     Mental Status: He is alert and oriented to person, place, and time. Mental status is at baseline.     Cranial Nerves: No dysarthria.     Motor: No weakness or abnormal muscle tone.     Coordination: Finger-Nose-Finger Test normal.     Comments: Strength 5/5 on BUE Strength 5/5 on BLE NO facial asymmetry, no dysarthria.      ED Results / Procedures / Treatments   Labs (all labs ordered are listed, but only abnormal results are displayed) Labs Reviewed  CBC WITH DIFFERENTIAL/PLATELET - Abnormal; Notable for the following components:      Result Value   Hemoglobin 12.7 (*)    Platelets 147 (*)    All other components within normal limits  COMPREHENSIVE METABOLIC PANEL - Abnormal; Notable for the following components:   Glucose, Bld 113 (*)    BUN 24 (*)    Creatinine, Ser 1.36 (*)    Albumin 3.3 (*)    GFR calc non Af Amer 43 (*)    GFR calc Af Amer 49 (*)    All other components within normal limits  PROTIME-INR  URINALYSIS, ROUTINE W REFLEX MICROSCOPIC  TROPONIN I (HIGH SENSITIVITY)    EKG None  Radiology DG Chest Portable 1 View   Result Date: 09/24/2019 CLINICAL DATA:  Fall EXAM: PORTABLE CHEST 1 VIEW COMPARISON:  2016 FINDINGS: Likely chronic background interstitial prominence. Patchy left mid and lower lung density. Mild pulmonary vascular congestion. Small left pleural effusion. Similar cardiomediastinal contours. Endovascular valve replacement is again noted. IMPRESSION: Mild pulmonary vascular congestion. Patchy left mid and lower lung atelectasis/consolidation. Small left pleural effusion. Electronically Signed   By: Macy Mis M.D.   On: 09/24/2019 15:03    Procedures Procedures (including critical care time)  Medications Ordered in ED Medications - No data to display  ED Course  I have reviewed the triage vital signs and the nursing notes.  Pertinent labs & imaging results that were available during my care of the patient were reviewed by me and considered in my medical decision making (see chart for details).    MDM Rules/Calculators/A&P   Patient with extensive past medical history including AAA, hypertension, TAVR presents to the ED status post likely syncopal episode at home.  According to EMS report, patient is usually ambulatory, oriented at baseline, ambulates without assistance.  On today's visit, patient was found by son at home, unknown how long he was down for, he last spoke to his son last night.  No focal neuro deficits on my exam.  He does have a goose egg to the top of his head, currently on Plavix.  No other obvious wounds, there is bruising to his left leg.  He reports no chest pain, shortness of breath, abdominal pain, difficulty urinating.  Will call [name] in order to obtain further collateral information.  2:30 PM Spoke to [name], who reports he spoke to pt this morning (10 am) usually speaks to him daily.  Pt did not answer the phone when he called,pt called son and told him "I am not sure how I feel today", couldn't tell him what had happened, he was lying on the floor, he didn't  know where he was. Son reports he was unable to tell him what happened, when he arrived at his house stuff was on the floor, picture frames and lamp, pt appeared disoriented. He has a bump on his head with a lot blood, he had defecated on himself. Son was able to shower him, and clean him up and called 911. Of note, ambulates without assistance, he currently drives to grocery store. This is patients second fall in the past 5 weeks.      CBC without any signs of infection, hemoglobin slightly decreased but within his baseline.  CMP without any hypokalemia or hyponatremia.  BUN slightly elevated, creatinine is remarkable for a mild AKI at 1.36, LFTs are within normal limits.  PT and INR are at baseline.  A chest x-ray was ordered to further evaluate patient.  Chest x-ray showed: Mild pulmonary vascular congestion. Patchy left mid and lower lung  atelectasis/consolidation. Small left pleural effusion.     Patient care signed out to Providence Lanius PA at shift change pending CT and further assessment for disposition.    Portions of this note were generated with [employer]. Dictation errors may occur despite best attempts at proofreading.  Final Clinical Impression(s) / ED Diagnoses Final diagnoses:  Altered mental status, unspecified altered mental status type    Rx / DC Orders ED Discharge Orders    None       Janeece Fitting, PA-C 09/24/19 1531    Lucrezia Starch, MD 09/26/19 [phone]

## 2019-09-25 ENCOUNTER — Encounter (HOSPITAL_COMMUNITY): Payer: Self-pay | Admitting: Internal Medicine

## 2019-09-25 ENCOUNTER — Observation Stay (HOSPITAL_BASED_OUTPATIENT_CLINIC_OR_DEPARTMENT_OTHER): Payer: Medicare Other

## 2019-09-25 ENCOUNTER — Other Ambulatory Visit: Payer: Self-pay

## 2019-09-25 DIAGNOSIS — I350 Nonrheumatic aortic (valve) stenosis: Secondary | ICD-10-CM

## 2019-09-25 DIAGNOSIS — R55 Syncope and collapse: Secondary | ICD-10-CM

## 2019-09-25 DIAGNOSIS — Z952 Presence of prosthetic heart valve: Secondary | ICD-10-CM

## 2019-09-25 DIAGNOSIS — I714 Abdominal aortic aneurysm, without rupture: Secondary | ICD-10-CM | POA: Diagnosis not present

## 2019-09-25 DIAGNOSIS — Z888 Allergy status to other drugs, medicaments and biological substances status: Secondary | ICD-10-CM

## 2019-09-25 DIAGNOSIS — S0003XA Contusion of scalp, initial encounter: Secondary | ICD-10-CM | POA: Diagnosis not present

## 2019-09-25 DIAGNOSIS — I442 Atrioventricular block, complete: Secondary | ICD-10-CM

## 2019-09-25 DIAGNOSIS — E785 Hyperlipidemia, unspecified: Secondary | ICD-10-CM

## 2019-09-25 DIAGNOSIS — R4182 Altered mental status, unspecified: Secondary | ICD-10-CM

## 2019-09-25 DIAGNOSIS — Z886 Allergy status to analgesic agent status: Secondary | ICD-10-CM

## 2019-09-25 DIAGNOSIS — Z950 Presence of cardiac pacemaker: Secondary | ICD-10-CM

## 2019-09-25 DIAGNOSIS — W19XXXA Unspecified fall, initial encounter: Secondary | ICD-10-CM

## 2019-09-25 DIAGNOSIS — Z79899 Other long term (current) drug therapy: Secondary | ICD-10-CM

## 2019-09-25 DIAGNOSIS — R778 Other specified abnormalities of plasma proteins: Secondary | ICD-10-CM

## 2019-09-25 DIAGNOSIS — K219 Gastro-esophageal reflux disease without esophagitis: Secondary | ICD-10-CM

## 2019-09-25 DIAGNOSIS — I10 Essential (primary) hypertension: Secondary | ICD-10-CM | POA: Diagnosis not present

## 2019-09-25 LAB — BASIC METABOLIC PANEL
Anion gap: 12 (ref 5–15)
BUN: 18 mg/dL (ref 8–23)
CO2: 25 mmol/L (ref 22–32)
Calcium: 8.9 mg/dL (ref 8.9–10.3)
Chloride: 101 mmol/L (ref 98–111)
Creatinine, Ser: 1.11 mg/dL (ref 0.61–1.24)
GFR calc Af Amer: >60 mL/min (ref >60)
GFR calc non Af Amer: 55 mL/min — ABNORMAL LOW (ref >60)
Glucose, Bld: 92 mg/dL (ref 70–99)
Potassium: 3.9 mmol/L (ref 3.5–5.1)
Sodium: 138 mmol/L (ref 135–145)

## 2019-09-25 LAB — CK TOTAL AND CKMB (NOT AT ARMC)
CK, MB: 12.4 ng/mL — ABNORMAL HIGH (ref 0.5–5.0)
Relative Index: 3.9 — ABNORMAL HIGH (ref 0.0–2.5)
Total CK: 315 U/L (ref 49–397)

## 2019-09-25 LAB — CBC
HCT: 40.5 % (ref 39.0–52.0)
Hemoglobin: 12.8 g/dL — ABNORMAL LOW (ref 13.0–17.0)
MCH: 29.5 pg (ref 26.0–34.0)
MCHC: 31.6 g/dL (ref 30.0–36.0)
MCV: 93.3 fL (ref 80.0–100.0)
Platelets: 130 10*3/uL — ABNORMAL LOW (ref 150–400)
RBC: 4.34 MIL/uL (ref 4.22–5.81)
RDW: 12.9 % (ref 11.5–15.5)
WBC: 9.6 10*3/uL (ref 4.0–10.5)
nRBC: 0 % (ref 0.0–0.2)

## 2019-09-25 LAB — ECHOCARDIOGRAM COMPLETE
Height: 62 in
Weight: 2308.8 oz

## 2019-09-25 LAB — GLUCOSE, CAPILLARY: Glucose-Capillary: 72 mg/dL (ref 70–99)

## 2019-09-25 LAB — SARS CORONAVIRUS 2 (TAT 6-24 HRS): SARS Coronavirus 2: NEGATIVE

## 2019-09-25 MED ORDER — SODIUM CHLORIDE 0.9 % IV BOLUS
500.0000 mL | Freq: Once | INTRAVENOUS | Status: AC
  Administered 2019-09-25: 500 mL via INTRAVENOUS

## 2019-09-25 NOTE — Consult Note (Signed)
Cardiology Consultation:   Patient ID: Jordan [name] MRN: [id number]; DOB: 09/23/1917  Admit date: 09/24/2019 Date of Consult: 09/25/2019  Primary Care Provider: Leanna Battles, MD Primary Cardiologist:  Jordan [name] Primary Electrophysiologist: Jordan [name]   Patient Profile:   Jordan [name] is a 86 y.o. male with a hx of CHB, s/p PPM insertion who is being seen today for the evaluation of syncope and lower abdominal pain at the request of Dr. Dareen [name].  History of Present Illness:   Jordan [name] is a very pleasant 86 yo man with multiple medical problems who has a h/o TAVR 2016, CHB, abdominal aortic aneurysm, not a candidate for stent grafting, who was admitted after an episode of altered consciousness associated with abdominal pain. His pain has resolved. He denies chest pain. His bp has been fairly controlled.   Heart Pathway Score:     Past Medical History:  Diagnosis Date  . AAA (abdominal aortic aneurysm) ([place])   . Anemia   . Aortic stenosis   . Arthritis 09/18/11   "in my knees"  . Basal cell carcinoma of skin   . Blood transfusion   . BPH (benign prostatic hyperplasia)   . CAD (coronary artery disease)    Remote stent to LAD in 1999. Last cath in 2004 and he is managed medically  . Colon polyps   . Complication of anesthesia   . Dysrhythmia    paced  . GERD (gastroesophageal reflux disease)   . Hemorrhoid   . HTN (hypertension)   . Hyperlipidemia   . Lung nodule    RLL  . 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 Renal mass, left   . 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 CARDIAC CATHETERIZATION  2004   Managed medically  . CARDIAC CATHETERIZATION    . CARDIAC CATHETERIZATION N/A 04/11/2015   Procedure: Right/Left Heart Cath and Coronary Angiography;  Surgeon: Jordan Blanks, MD;  Location: [place] [facility];  Service: Cardiovascular;  Laterality: N/A;  . CORONARY ANGIOGRAM  09/19/2011   Procedure: CORONARY ANGIOGRAM;  Surgeon: Jordan Hector, MD;  Location: [facility] CATH LAB;  Service: Cardiovascular;;  . CORONARY ANGIOPLASTY WITH STENT PLACEMENT  05/09/1998   "1"; LAD  . INSERT / REPLACE / REMOVE PACEMAKER  2004   initial placement; [employer]  . INSERT / REPLACE / REMOVE PACEMAKER  02/14/2011  . NASAL HEMORRHAGE CONTROL  1980's   Clips placed to stop bleeding  . SKIN CANCER EXCISION  09/18/11   "have had a couple hundred of them removed since I was in my 40's"  . TEE WITHOUT CARDIOVERSION N/A 05/15/2015   Procedure: TRANSESOPHAGEAL ECHOCARDIOGRAM (TEE);  Surgeon: Jordan Blanks, MD;  Location: [place];  Service: Open Heart Surgery;  Laterality: N/A;  . TONSILLECTOMY     "when I was a teenager"  . TRANSCATHETER AORTIC VALVE REPLACEMENT, TRANSFEMORAL N/A 05/15/2015   Procedure: TRANSCATHETER AORTIC VALVE REPLACEMENT, TRANSFEMORAL;  Surgeon: Jordan Blanks, MD;  Location: [place];  Service: Open Heart Surgery;  Laterality: N/A;     Home Medications:  Prior to Admission medications   Medication Sig Start Date End Date Taking? Authorizing Provider  acetaminophen (TYLENOL) 325 MG tablet Take 650 mg by mouth every 6 (six) hours as needed for mild pain, moderate pain or fever.  Yes [provider]  amLODipine (NORVASC) 5 MG tablet TAKE 1 TABLET BY MOUTH EVERY DAY Patient taking differently: Take 5 mg by mouth daily.  08/22/19  Yes Jordan [name], Jordan M, MD  atorvastatin (LIPITOR) 10 MG tablet Take 1 tablet (10 mg total) by mouth daily. 08/22/19  Yes Jordan [name], Jordan M, MD  clopidogrel (PLAVIX) 75 MG tablet TAKE 1 TABLET BY MOUTH EVERY DAY Patient taking differently: Take 75 mg by mouth daily.  07/12/19  Yes Jordan [name], Jordan M, MD  silodosin (RAPAFLO) 4 MG CAPS capsule Take 4 mg by mouth every evening.    Yes [provider]  NEXIUM 24HR 20 MG capsule  TAKE 1 CAPSULE BY MOUTH DAILY AT 12 NOON Patient not taking: Reported on 09/24/2019 08/22/19   Jordan [name], Jordan M, MD    Inpatient Medications: Scheduled Meds: . atorvastatin  10 mg Oral Daily  . clopidogrel  75 mg Oral Daily  . enoxaparin (LOVENOX) injection  40 mg Subcutaneous Q24H  . pantoprazole  40 mg Oral Daily  . sodium chloride flush  3 mL Intravenous Q12H  . tamsulosin  0.4 mg Oral QPC supper   Continuous Infusions:  PRN Meds: acetaminophen  Allergies:    Allergies  Allergen Reactions  . Aspirin Other (See Comments)    Stomach bleeds  . Nitroglycerin     Makes patient faint    Social History:   Social History   Socioeconomic History  . Marital status: Married    Spouse name: Not on file  . Number of children: 2  . Years of education: Not on file  . Highest education level: Not on file  Occupational History  . Occupation: Retired    Comment: Building control surveyor  Tobacco Use  . Smoking status: Former Smoker    Packs/day: 1.00    Years: 42.00    Pack years: 42.00    Types: Cigarettes    Quit date: 08/18/1974    Years since quitting: 45.1  . Smokeless tobacco: Former Systems developer    Types: Chew  Substance and Sexual Activity  . Alcohol use: No    Alcohol/week: 0.0 standard drinks  . Drug use: No  . Sexual activity: Yes  Other Topics Concern  . Not on file  Social History Narrative  . Not on file   Social Determinants of Health   Financial Resource Strain:   . Difficulty of Paying Living Expenses: Not on file  Food Insecurity:   . Worried About Charity fundraiser in the Last Year: Not on file  . Ran Out of Food in the Last Year: Not on file  Transportation Needs:   . Lack of Transportation (Medical): Not on file  . Lack of Transportation (Non-Medical): Not on file  Physical Activity:   . Days of Exercise per Week: Not on file  . Minutes of Exercise per Session: Not on file  Stress:   . Feeling of Stress : Not on file  Social Connections:   . Frequency of  Communication with Friends and Family: Not on file  . Frequency of Social Gatherings with Friends and Family: Not on file  . Attends Religious Services: Not on file  . Active Member of Clubs or Organizations: Not on file  . Attends Archivist Meetings: Not on file  . Marital Status: Not on file  Intimate Partner Violence:   . Fear of Current or Ex-Partner: Not on file  . Emotionally Abused: Not on file  . Physically Abused: Not on file  .  Sexually Abused: Not on file    Family History:    Family History  Problem Relation Age of Onset  . Cancer Father        stomach  . Colon cancer Father   . Heart disease Brother   . Heart disease Brother   . Diabetes Other        granddaughter     ROS:  Please see the history of present illness.   All other ROS reviewed and negative.     Physical Exam/Data:   Vitals:   09/24/19 2145 09/24/19 2241 09/24/19 2245 09/25/19 0443  BP: (!) 118/58  (!) 149/85 (!) 153/82  Pulse: 63  83 77  Resp: 16   (!) 22  Temp:   98 F (36.7 C) 98.6 F (37 C)  TempSrc:   Oral Oral  SpO2: 93%  97% 95%  Weight:  65.5 kg    Height:  5\' 2"  (1.575 [name])      Intake/Output Summary (Last 24 hours) at 09/25/2019 1304 Last data filed at 09/24/2019 2302 Gross per 24 hour  Intake 250 ml  Output --  Net 250 ml   Last 3 Weights 09/24/2019 09/24/2019 09/06/2019  Weight (lbs) 144 lb 4.8 oz 162 lb 3.2 oz 162 lb 3.2 oz  Weight (kg) 65.454 kg 73.573 kg 73.573 kg     Body mass index is 26.39 kg/[name].  General:  Well nourished, well developed, in no acute distress HEENT: normal Lymph: no adenopathy Neck: 7 cm JVD Endocrine:  No thryomegaly Vascular: No carotid bruits; FA pulses 2+ bilaterally without bruits  Cardiac:  normal S1, S2; RRR; 2/6 systolic murmur  Lungs:  clear to auscultation bilaterally, no wheezing, rhonchi or rales  Abd: soft, nontender, no hepatomegaly  Ext: no edema Musculoskeletal:  No deformities, BUE and BLE strength normal and  equal Skin: warm and dry  Neuro:  CNs 2-12 intact, no focal abnormalities noted Psych:  Normal affect   EKG:  The EKG was personally reviewed and demonstrates:  NSR with ventricular pacing Telemetry:  Telemetry was personally reviewed and demonstrates:  nsr with ventricular pacing  Relevant CV Studies: none  Laboratory Data:  High Sensitivity Troponin:   Recent Labs  Lab 09/24/19 1405 09/24/19 1634 09/24/19 1902 09/24/19 2200 09/24/19 2221  TROPONINIHS 2,562* 2,467* 2,801* 3,044* 2,813*     Chemistry Recent Labs  Lab 09/24/19 1405 09/25/19 0006  NA 136 138  K 4.2 3.9  CL 100 101  CO2 25 25  GLUCOSE 113* 92  BUN 24* 18  CREATININE 1.36* 1.11  CALCIUM 9.1 8.9  GFRNONAA 43* 55*  GFRAA 49* >60  ANIONGAP 11 12    Recent Labs  Lab 09/24/19 1405  PROT 7.7  ALBUMIN 3.3*  AST 41  ALT 20  ALKPHOS 49  BILITOT 0.8   Hematology Recent Labs  Lab 09/24/19 1405 09/25/19 0006  WBC 10.1 9.6  RBC 4.31 4.34  HGB 12.7* 12.8*  HCT 40.3 40.5  MCV 93.5 93.3  MCH 29.5 29.5  MCHC 31.5 31.6  RDW 12.7 12.9  PLT 147* 130*   BNPNo results for input(s): BNP, PROBNP in the last 168 hours.  DDimer No results for input(s): DDIMER in the last 168 hours.   Radiology/Studies:  CT Head Wo Contrast  Result Date: 09/24/2019 CLINICAL DATA:  Headache, questionable fall. EXAM: CT HEAD WITHOUT CONTRAST TECHNIQUE: Contiguous axial images were obtained from the base of the skull through the vertex without intravenous contrast. COMPARISON:  None. FINDINGS:  Brain: There is atrophy and chronic small vessel disease changes. No acute intracranial abnormality. Specifically, no hemorrhage, hydrocephalus, mass lesion, acute infarction, or significant intracranial injury. Vascular: No hyperdense vessel or unexpected calcification. Skull: No acute calvarial abnormality. Sinuses/Orbits: Visualized paranasal sinuses and mastoids clear. Orbital soft tissues unremarkable. Other: None IMPRESSION: Atrophy,  chronic microvascular disease. No acute intracranial abnormality. Electronically Signed   By: Rolm Baptise [name].D.   On: 09/24/2019 15:53   DG Chest Portable 1 View  Result Date: 09/24/2019 CLINICAL DATA:  Fall EXAM: PORTABLE CHEST 1 VIEW COMPARISON:  2016 FINDINGS: Likely chronic background interstitial prominence. Patchy left mid and lower lung density. Mild pulmonary vascular congestion. Small left pleural effusion. Similar cardiomediastinal contours. Endovascular valve replacement is again noted. IMPRESSION: Mild pulmonary vascular congestion. Patchy left mid and lower lung atelectasis/consolidation. Small left pleural effusion. Electronically Signed   By: Macy Mis [name].D.   On: 09/24/2019 15:03   ECHOCARDIOGRAM COMPLETE  Result Date: 09/25/2019   ECHOCARDIOGRAM REPORT   Patient Name:   Jordan [name] Date of Exam: 09/25/2019 Medical Rec #:  [id number]         Height:       62.0 in Accession #:    [id number]        Weight:       144.3 lb Date of Birth:  27-Dec-1917        BSA:          1.66 [name] Patient Age:    40 years          BP:           153/82 mmHg Patient Gender: [name]                 HR:           82 bpm. Exam Location:  Inpatient Procedure: 2D Echo, Cardiac Doppler and Color Doppler Indications:    R55 Syncope  History:        Patient has prior history of Echocardiogram examinations, most                 recent 05/28/2016. CAD, Aortic Valve Disease; Risk                 Factors:Hypertension and Dyslipidemia. Aortic Valve: A TAVR                 TAVR. Abdominal Aortic Aneurysm. GERD.  Sonographer:    Jonelle Sidle Dance Referring Phys: [place]  1. Left ventricular ejection fraction, by visual estimation, is 60 to 65%. The left ventricle has normal function. There is mildly increased left ventricular hypertrophy.  2. Elevated left atrial pressure.  3. Left ventricular diastolic parameters are consistent with Grade I diastolic dysfunction (impaired relaxation).  4. The left ventricle has  no regional wall motion abnormalities.  5. Global right ventricle has normal systolic function.The right ventricular size is normal.  6. Left atrial size was mildly dilated.  7. Right atrial size was normal.  8. Severe mitral annular calcification.  9. The mitral valve is normal in structure. Mild mitral valve regurgitation. No evidence of mitral stenosis. 10. The tricuspid valve is normal in structure. 11. The aortic valve is normal in structure. Aortic valve regurgitation is not visualized. No evidence of aortic valve sclerosis or stenosis. 12. The pulmonic valve was normal in structure. Pulmonic valve regurgitation is not visualized. 13. The inferior vena cava is normal in size with greater than 50% respiratory variability, suggesting right  atrial pressure of 3 mmHg. 14. Normal LV systolic function; mild LVH; grade 1 diastolic dysfunction; s/p TAVR with mean gradient 10 mmHg; AVA 1.8 cm2) and no AI; mild LAE; mild MR. FINDINGS  Left Ventricle: Left ventricular ejection fraction, by visual estimation, is 60 to 65%. The left ventricle has normal function. The left ventricle has no regional wall motion abnormalities. There is mildly increased left ventricular hypertrophy. Left ventricular diastolic parameters are consistent with Grade I diastolic dysfunction (impaired relaxation). Elevated left atrial pressure. Right Ventricle: The right ventricular size is normal.Global RV systolic function is has normal systolic function. Left Atrium: Left atrial size was mildly dilated. Right Atrium: Right atrial size was normal in size Pericardium: There is no evidence of pericardial effusion. Mitral Valve: The mitral valve is normal in structure. Severe mitral annular calcification. Mild mitral valve regurgitation. No evidence of mitral valve stenosis by observation. Tricuspid Valve: The tricuspid valve is normal in structure. Tricuspid valve regurgitation is trivial. Aortic Valve: The aortic valve is normal in structure.  Aortic valve regurgitation is not visualized. The aortic valve is structurally normal, with no evidence of sclerosis or stenosis. Aortic valve mean gradient measures 10.5 mmHg. Aortic valve peak gradient measures 22.2 mmHg. Aortic valve area, by VTI measures 1.84 cm. TAVR valve is present in the aortic position. Pulmonic Valve: The pulmonic valve was normal in structure. Pulmonic valve regurgitation is not visualized. Pulmonic regurgitation is not visualized. Aorta: The aortic root is normal in size and structure. Venous: The inferior vena cava is normal in size with greater than 50% respiratory variability, suggesting right atrial pressure of 3 mmHg. IAS/Shunts: No atrial level shunt detected by color flow Doppler. Additional Comments: Normal LV systolic function; mild LVH; grade 1 diastolic dysfunction; s/p TAVR with mean gradient 10 mmHg; AVA 1.8 cm2) and no AI; mild LAE; mild MR.  LEFT VENTRICLE PLAX 2D LVIDd:         3.90 cm  Diastology LVIDs:         2.90 cm  LV e' lateral:   5.00 cm/s LV PW:         1.20 cm  LV E/e' lateral: 20.2 LV IVS:        1.10 cm  LV e' medial:    4.24 cm/s LVOT diam:     2.00 cm  LV E/e' medial:  23.8 LV SV:         34 ml LV SV Index:   19.72 LVOT Area:     3.14 cm  RIGHT VENTRICLE            IVC RV Basal diam:  2.40 cm    IVC diam: 2.30 cm RV S prime:     9.57 cm/s TAPSE ([name]-mode): 1.4 cm LEFT ATRIUM             Index       RIGHT ATRIUM          Index LA diam:        4.20 cm 2.52 cm/[name]  RA Area:     9.89 cm LA Vol (A2C):   57.1 ml 34.31 ml/[name] RA Volume:   17.20 ml 10.34 ml/[name] LA Vol (A4C):   48.1 ml 28.91 ml/[name] LA Biplane Vol: 55.9 ml 33.59 ml/[name]  AORTIC VALVE AV Area (Vmax):    1.51 cm AV Area (Vmean):   1.83 cm AV Area (VTI):     1.84 cm AV Vmax:           235.67  cm/s AV Vmean:          149.500 cm/s AV VTI:            0.431 [name] AV Peak Grad:      22.2 mmHg AV Mean Grad:      10.5 mmHg LVOT Vmax:         113.50 cm/s LVOT Vmean:        86.850 cm/s LVOT VTI:          0.253 [name] LVOT/AV  VTI ratio: 0.59  AORTA Ao Root diam: 3.70 cm Ao Asc diam:  3.70 cm MITRAL VALVE MV Area (PHT): 1.94 cm              SHUNTS MV PHT:        113.39 msec           Systemic VTI:  0.25 [name] MV Decel Time: 391 msec              Systemic Diam: 2.00 cm MV E velocity: 101.00 cm/s 103 cm/s MV A velocity: 163.00 cm/s 70.3 cm/s MV E/A ratio:  0.62        1.5  Kirk Ruths MD Electronically signed by Kirk Ruths MD Signature Date/Time: 09/25/2019/11:48:21 AM    Final     Assessment and Plan:   1. Elevated troponin - he does not have any clinical stigmata for an acute coronary syndrome. His lower abdominal pain is not due to angina. 2. Abdominal pain - he has a AAA and I worry that his pain could be related. According to Dr. [place], he is not thought to be a candidate for an open procedure. 3. Syncope - etiology is unclear. Might have been vagal from abdominal pain.  4. Disp. - there is no good options for treatment. He would like to go home. With his advanced age, limited medical options if this is an aneurysm trying to rupture, I think early discharge is reasonable.     For questions or updates, please contact [place] Please consult [email or web address] for contact info under   Signed, Cristopher [place], MD  09/25/2019 1:04 PM

## 2019-09-25 NOTE — TOC Transition Note (Signed)
Transition of Care [facility]) - CM/SW Discharge Note   Patient Details  Name: LONEY SMELLEY MRN: [id number] Date of Birth: [birth year]  Transition of Care [facility]) CM/SW Contact:  Claudie Leach, RN 09/25/2019, 1:53 PM   Clinical Narrative:    Per PT/OT recommendations, pt will d/c with HHPT/OT.  D/W son.  No preference of [place] agency. He is agreeable to [facility], which accepts [facility] Medicare plans.    Patient will need [facility] PT/OT orders with Face to face.   Son states they do not have a 3n1 and he would like to have one.  3n1 ordered and will be delivered to patient's room.     Final next level of care: [place] Barriers to Discharge: No Barriers Identified   Patient Goals and CMS Choice Patient states their goals for this hospitalization and ongoing recovery are:: to get well CMS Medicare.gov Compare Post Acute Care list provided to:: Patient Represenative (must comment) Choice offered to / list presented to : Adult Children   Discharge Plan and Services                DME Arranged: 3-N-1 DME Agency: [employer] Date DME Agency Contacted: 09/25/19 Time DME Agency Contacted: [phone] Representative spoke with at DME Agency: [place]: PT, OT HH Agency: [facility] [place] Date [place]: 09/25/19 Time [place]: 1353 Representative spoke with at [place]

## 2019-09-25 NOTE — Progress Notes (Signed)
  Echocardiogram 2D Echocardiogram has been performed.  Jordan [name] Jordan [name] 09/25/2019, 10:21 AM

## 2019-09-25 NOTE — Evaluation (Signed)
Occupational Therapy Evaluation Patient Details Name: Jordan [name] MRN: [id number] DOB: 1917-09-24 Today's Date: 09/25/2019    History of Present Illness Mr. Strodtman is a 86 y/o gentleman with history of AS s/p TAVR, CHB s/p PPM, dyslipidemia, HTN who presents following an unwitnessed syncopal event at home. pMH: impaired vision, AAA, arthritis in knees, basal cell carcinoma of skin, pacemaker, s/p TAVR HTN   Clinical Impression   PTA pt living alone, driving and managing meals without use of AD. Son and DIL on phone to relay information during session. Pt presents with cognitive deficits, baseline visual deficits, and decreased activity tolerance. He is able to complete bed mobility at mod A level and transfers at min A. He requires min A +2 for safety with functional mobility and navigating environment. Max multimodal safety cues needed throughout session. While standing at the sink to groom with min guard assist, pt overshooting water to rinse tooth brush (suspect depth perception impairment). This could further attribute to poor balance and increased falls. Given his current status, recommend HHOT with 24/7 assist (which son and DIL said they can provide) for improvement of BADL tolerance safety in home environment. Will continue to follow per POC listed below.     Follow Up Recommendations  Home health OT;Supervision/Assistance - 24 hour    Equipment Recommendations  None recommended by OT    Recommendations for Other Services       Precautions / Restrictions Precautions Precautions: Fall Precaution Comments: extremely HOH, HR between 89-130s bpm Restrictions Weight Bearing Restrictions: No      Mobility Bed Mobility Overal bed mobility: Needs Assistance Bed Mobility: Supine to Sit     Supine to sit: HOB elevated;Mod assist     General bed mobility comments: max directional verbal and tactile cues to complete task, suspect combination of HOH and impaired  sequencing  Transfers Overall transfer level: Needs assistance Equipment used: 1 person hand held assist Transfers: Sit to/from Stand Sit to Stand: Min assist         General transfer comment: max directional verbal and tactile cues, increased time, slow and guarded    Balance Overall balance assessment: Needs assistance Sitting-balance support: Feet unsupported;No upper extremity supported Sitting balance-Leahy Scale: Poor Sitting balance - Comments: posterior lean, worse when trying to don socks   Standing balance support: Single extremity supported Standing balance-Leahy Scale: Fair Standing balance comment: pt stood at sink with OT to brush teeth and dentures, pt placed elbow on sink to steady self                           ADL either performed or assessed with clinical judgement   ADL Overall ADL's : Needs assistance/impaired Eating/Feeding: Set up;Sitting   Grooming: Set up;Cueing for sequencing;Standing;Oral care Grooming Details (indicate cue type and reason): standing at sink to complete oral care, cues for sequencing. Pt appearing to havev depth perceptiond deficits with over/undershoot of tooth brush under water Upper Body Bathing: Set up;Sitting   Lower Body Bathing: Moderate assistance;Sit to/from stand   Upper Body Dressing : Set up;Sitting   Lower Body Dressing: Moderate assistance;Sit to/from stand Lower Body Dressing Details (indicate cue type and reason): to don socks- cannot sustain figure 4 method or reach down to feet without mod A. Toilet Transfer: Minimal assistance;+2 for physical assistance;+2 for safety/equipment;Regular Toilet;Ambulation   Toileting- Clothing Manipulation and Hygiene: Set up;Sit to/from stand   Tub/ Shower Transfer: Minimal assistance;+2 for physical assistance;+2 for  safety/equipment;Rolling walker   Functional mobility during ADLs: Minimal assistance;+2 for physical assistance;+2 for safety/equipment;Cueing for  safety;Cueing for sequencing General ADL Comments: pt limited by cognitive deficits, baseline visual deficits, and decreased activity tolerance with generalized weakness     Vision Patient Visual Report: No change from baseline Vision Assessment?: Vision impaired- to be further tested in functional context Additional Comments: pt vision difficult to specifically test given cognition and HOH. Pt reports his R eye has decreased vision, but on assessment it appeared his L eye had some limitations as well. Pt appearing to have depth perception difficulties when rinsing toothbrush and over shooting location of water.     Perception     Praxis      Pertinent Vitals/Pain Pain Assessment: Faces Faces Pain Scale: No hurt     Hand Dominance Right   Extremity/Trunk Assessment Upper Extremity Assessment Upper Extremity Assessment: Generalized weakness   Lower Extremity Assessment Lower Extremity Assessment: Generalized weakness   Cervical / Trunk Assessment Cervical / Trunk Assessment: Kyphotic   Communication Communication Communication: HOH   Cognition Arousal/Alertness: Awake/alert Behavior During Therapy: WFL for tasks assessed/performed Overall Cognitive Status: Impaired/Different from baseline Area of Impairment: Orientation;Memory;Following commands;Safety/judgement;Problem solving;Awareness                 Orientation Level: Disoriented to;Time;Situation(stated hospital in [place], unsure as to why)   Memory: Decreased short-term memory Following Commands: Follows one step commands with increased time Safety/Judgement: Decreased awareness of safety;Decreased awareness of deficits Awareness: Emergent Problem Solving: Slow processing;Difficulty sequencing;Requires verbal cues;Requires tactile cues General Comments: requiring max verbal and tactile cues to complete tasks, pt HOH could be contributing to that. Often perseverates on previous conversation topic despite moving  past that. Did say many unintelligble phrases and words mixed together like word salad at times   General Comments  pt with irritation and redness in L eye, some scabs and bruising on face and LEs    Exercises     Shoulder Instructions      Home Living Family/patient expects to be discharged to:: Private residence Living Arrangements: Alone Available Help at Discharge: Family;Available 24 hours/day Type of Home: House Home Access: Stairs to enter [employer] of Steps: 5 Entrance Stairs-Rails: Can reach both Home Layout: One level     Bathroom Shower/Tub: Teacher, early years/pre: Standard     Home Equipment: Environmental consultant - 2 wheels;Cane - single point;Shower seat;Grab bars - tub/shower   Additional Comments: son and dtr in law can stay with pt 24/7      Prior Functioning/Environment Level of Independence: Independent        Comments: amb without AD typically, was driving and doing grocery shopping and going to fast food restaurants, son started doing it more since COVID        OT Problem List: Decreased strength;Decreased knowledge of use of DME or AE;Decreased activity tolerance;Impaired vision/perception;Decreased cognition;Impaired balance (sitting and/or standing);Decreased safety awareness      OT Treatment/Interventions: Self-care/ADL training;Visual/perceptual remediation/compensation;Therapeutic exercise;Patient/family education;Balance training;Energy conservation;Therapeutic activities;DME and/or AE instruction;Cognitive remediation/compensation    OT Goals(Current goals can be found in the care plan section) Acute Rehab OT Goals Patient Stated Goal: didn't state OT Goal Formulation: With patient Time For Goal Achievement: 10/09/19 Potential to Achieve Goals: Good  OT Frequency: Min 2X/week   Barriers to D/C:            Co-evaluation PT/OT/SLP Co-Evaluation/Treatment: Yes Reason for Co-Treatment: Necessary to address  cognition/behavior during functional activity PT goals addressed during  session: Mobility/safety with mobility OT goals addressed during session: ADL's and self-care;Strengthening/ROM      AM-PAC OT "6 Clicks" Daily Activity     Outcome Measure Help from another person eating meals?: A Little Help from another person taking care of personal grooming?: A Little Help from another person toileting, which includes using toliet, bedpan, or urinal?: A Little Help from another person bathing (including washing, rinsing, drying)?: A Lot Help from another person to put on and taking off regular upper body clothing?: A Little Help from another person to put on and taking off regular lower body clothing?: A Lot 6 Click Score: 16   End of Session Equipment Utilized During Treatment: Gait belt;Rolling walker Nurse Communication: Mobility status  Activity Tolerance: Patient tolerated treatment well Patient left: in chair;with call bell/phone within reach;with chair alarm set  OT Visit Diagnosis: Unsteadiness on feet (R26.81);Other abnormalities of gait and mobility (R26.89);Muscle weakness (generalized) (M62.81);History of falling (Z91.81);Other symptoms and signs involving cognitive function                Time: [id number] OT Time Calculation (min): 36 min Charges:  OT General Charges $OT Visit: 1 Visit OT Evaluation $OT Eval Moderate Complexity: 1 Mod  Zenovia Jarred, MSOT, OTR/L Acute Rehabilitation Services [facility] Office Number: [phone]  Zenovia Jarred 09/25/2019, 10:34 AM

## 2019-09-25 NOTE — Care Management Obs Status (Signed)
[facility] NOTIFICATION   Patient Details  Name: Jordan [name] MRN: [id number] Date of Birth: Jan 18, 1918   Medicare Observation Status Notification Given:  Yes    Claudie Leach, RN 09/25/2019, 1:32 PM

## 2019-09-25 NOTE — Progress Notes (Signed)
  Date: 09/25/2019  Patient name: Jordan [name]  Medical record number: [id number]  Date of birth: [birth year]   I have seen and evaluated Jordan [name] and discussed their care with the Residency Team.  In brief, patient is a [age]-year-old male with a past medical history of aortic stenosis status post TAVR, complete heart block status post PPM, dyslipidemia, hypertension, abdominal aortic aneurysm (not a candidate for stent grafting) who presented to the ED with syncope x1 episode.  Is confused this morning and is unable to provide a good history at this time.  History obtained from chart.  Patient was in his usual state of health when he went to bed on the night prior to admission.  He woke up in the middle of the night to use the bathroom and was unable to see in the room or get his bearings with the lights out so he crawled on the floor and states he actually knocked over a lamp which hit his head.  Patient had a bowel movement while on the floor.  He was able to get to the phone and call his son who came by accompanied him up and brought him to the ED for further evaluation.  No chest pain, no shortness of breath, no palpitations, no lightheadedness, no fevers or chills, no abdominal pain, no nausea or vomiting.  Today patient was oriented only to self.  He follows commands and attempts to answer questions but is unable to.  PMHx, Fam Hx, and/or Soc Hx : As per resident note  Vitals:   09/24/19 2245 09/25/19 0443  BP: (!) 149/85 (!) 153/82  Pulse: 83 77  Resp:  (!) 22  Temp: 98 F (36.7 C) 98.6 F (37 C)  SpO2: 97% 95%   General: Awake, alert, oriented x1, NAD CVs: Regular rate and rhythm, normal heart sounds, lungs: CTA bilaterally Abdomen: Soft, nontender, nondistended, normoactive bowel sounds Extremities: No edema noted, nontender to palpation Psych: Normal mood and affect Skin: Warm and dry Neuro: Oriented x1, able to follow commands, repeats himself multiple  times  Assessment and Plan: I have seen and evaluated the patient as outlined above. I agree with the formulated Assessment and Plan as detailed in the residents' note, with the following changes:    1.  Syncope versus fall with subsequent AMS: -Patient presented to ED with questionable syncope versus fall with AMS.  CT head with no evidence of hemorrhage or stroke.  Patient with no signs of underlying infection (no leukocytosis, no fevers).  He was noted to be orthostatic here and may have had orthostatic hypotension with syncope versus a postconcussive confusion from his fall. -Patient given additional bolus of 500 cc today -Repeat echo showed a normal LV systolic function and no significant valvular disease -PT/OT recommending home health 24-hour supervision -Patient son able to provide this -I suspect the patient's delirium will likely improve in more familiar surroundings.  No further work-up at this time for these events.  2.  Elevated troponin: -Patient noted to have an elevated high-sensitivity troponin up to 3000 on this admission.  Patient denies any chest pain and EKG without any acute ischemic changes. -Cardiology follow-up and recommendations appreciated. -No further work-up at this time. -We will DC patient home today  Aldine Contes, MD 1/10/20211:47 PM

## 2019-09-25 NOTE — Progress Notes (Signed)
Subjective: Pt seen at the bedside this morning. Alert and oriented only to self. Confused, though pleasant, following commands, and attempting to respond to questions.   Spoke with pt's son, Jordan [name]. Discussed largely unremarkable work up thus far and concern for hospital delirium now. Son able to come in later today and stay with pt at home for 24/7 supervision.  Objective:  Vital signs in last 24 hours: Vitals:   09/24/19 2145 09/24/19 2241 09/24/19 2245 09/25/19 0443  BP: (!) 118/58  (!) 149/85 (!) 153/82  Pulse: 63  83 77  Resp: 16   (!) 22  Temp:   98 F (36.7 C) 98.6 F (37 C)  TempSrc:   Oral Oral  SpO2: 93%  97% 95%  Weight:  65.5 kg    Height:  5\' 2"  (1.575 m)     Physical Exam Vitals and nursing note reviewed.  HENT:     Head:     Comments: Pt with hematoma over top of head, stable. Cardiovascular:     Rate and Rhythm: Normal rate and regular rhythm.     Heart sounds: Normal heart sounds.  Pulmonary:     Effort: Pulmonary effort is normal.     Breath sounds: Normal breath sounds.  Musculoskeletal:     Right lower leg: No edema.     Left lower leg: No edema.  Skin:    General: Skin is warm and dry.  Neurological:     General: No focal deficit present.     Mental Status: He is alert. He is confused.     Comments: Oriented to self only  Psychiatric:     Comments: Repeating words/phrasing    Assessment/Plan:  Active Problems:   Syncope  Jordan [name] is a 86 y/o gentleman with history of AS s/p TAVR, CHB s/p PPM, dyslipidemia, HTN who presents following a fall vs unwitnessed syncopal event at home and ?altered mental status.  Syncope vs fall and subsequent AMS Hospital delirium Work-up thus far unremarkable for etiology of ?syncope/fall and initial AMS yesterday. CT head negative for trauma, bleed, or stroke. Lab work within normal limits. No signs of infectious process. Most likely pt had post-concussive confusion after trauma to head vs orthostatic  syncopal episode. - orthostatic vitals positive with >12mmHg drop is SBP - will bolus an additional 500cc - repeat echo with normal LV systolic function, mild LVH, grade I diastolic dysfunction, s/p TAVR with mean gradient 108mmg Hg; AVA 1.8 cm2 and no AI, mild LAE, and mild MR - morning EKG stable  - PT/OT eval recommending HH with 24/7 supervision which pt's son, Jordan [name], is agreeable to - will monitor through the afternoon to see if mental status improved - favor getting pt discharged today with son so as to get pt back to familiar surroundings and prevent worsening of hospital delirium   Elevated troponin Aortic stenosis s/p TAVR Troponin elevated though appears stable 2,562 >> 2,467 >> 2,801 >> 3,044 >> 2,813. Pt continues to deny chest pain. Pt last seen by cardiology on 08/2019. Pt with evidence of AS despite TAVR, though given he was asymptomatic and advance age no further work-up was pursued at that time.  - repeat echo as above - CKMB elevated to 12.4 with relative index of 3.9 indicative of cardiac source Cardiology consulted, appreciate their input into the case   AKI - resolved Cr on admission 1.36, with prior baseline 4 years ago 1.11. - Cr this AM 1.11 after bolus in the ED  HTN/hyperlipidemia - holding home amlodipine - continue home atorvastatin  GERD - continue pantoprazole 40mg  daily  Diet - heart healthy Fluids - none DVT ppx - enoxaparin 40mg  subQ daily CODE STATUS - FULL  Dispo: Anticipated discharge later this afternoon.  Jordan Horns, MD 09/25/2019, 6:32 AM Pager: [phone]

## 2019-09-25 NOTE — Discharge Summary (Signed)
Name: Jordan [name] MRN: [id number] DOB: 11/10/1917 86 y.o. PCP: Leanna Battles, MD  Date of Admission: 09/24/2019  1:45 PM Date of Discharge: 09/25/2019 Attending Physician: Aldine Contes, MD  Discharge Diagnosis: Active Problems:   Syncope  Discharge Medications: Allergies as of 09/25/2019      Reactions   Aspirin Other (See Comments)   Stomach bleeds   Nitroglycerin    Makes patient faint      Medication List    TAKE these medications   acetaminophen 325 MG tablet Commonly known as: TYLENOL Take 650 mg by mouth every 6 (six) hours as needed for mild pain, moderate pain or fever.   amLODipine 5 MG tablet Commonly known as: Norvasc TAKE 1 TABLET BY MOUTH EVERY DAY What changed:   how much to take  how to take this  when to take this  additional instructions   atorvastatin 10 MG tablet Commonly known as: LIPITOR Take 1 tablet (10 mg total) by mouth daily.   clopidogrel 75 MG tablet Commonly known as: PLAVIX TAKE 1 TABLET BY MOUTH EVERY DAY   NexIUM 24HR 20 MG capsule Generic drug: esomeprazole TAKE 1 CAPSULE BY MOUTH DAILY AT 12 NOON   silodosin 4 MG Caps capsule Commonly known as: RAPAFLO Take 4 mg by mouth every evening.            Durable Medical Equipment  (From admission, onward)         Start     Ordered   09/25/19 1336  For home use only DME 3 n 1  Once     09/25/19 1335          Disposition and follow-up:   Jordan [name] was discharged from [facility] in Stable condition.  At the hospital follow up visit please address:  1.  Syncope/fall - orthostatic hypotension vs post-concussion confusion after ?fall - ensure pt has home health needs to maximize functionality in light of pt's advanced age and frailty   Elevated troponin - without chest pain and EKG without acute ischemic changes - cards consulted, no clinical stigmata for ACS - continue outpatient follow-up for AAA monitoring and  pacemaker checks  2.  Labs / imaging needed at time of follow-up: NONE  3.  Pending labs/ test needing follow-up: NONE  Follow-up Appointments: Follow-up Information    [facility].   Specialty: Home Health Services Why: Agency will contact you to arrange physical and occupational therapy at home.  Contact information: [place] [phone]           Hospital Course by problem list: 1. Jordan [name] is a 86 y/o gentleman with history of AS s/p TAVR, CHB s/p PPM, dyslipidemia, HTN who presents following a?fall vsunwitnessed syncopal event at home and subsequent altered mental status. Pt stated he woke-up in the middle of the night, had to use the restroom, and got disoriented in the dark (knocking over a lamp/picture frames and defecating on himself). CT head negative for trauma, bleed, or stroke. Lab work unremarkable for metabolic or infectious case. Orthostatic vitals positive by criteria, though remained within normotensive range. Repeat echo stable with normal LV systolic function, mild LVH, grade I diastolic dysfunction, s/p TAVR with mean gradient 52mmg Hg; AVA 1.8 cm2 and no AI, mild LAE, and mild MR.   Pt's mental status began to clear in the ED before admission. Unfortunately, on the morning of hospital day 1 he was confused  secondary to hospital delirium. Patient evaluated by PT/OT who recommended additional Pill[facility] services and 24/7 supervision with the pt's son, Louie Casa, stated he would provide.  Of note, pt's troponin was elevated on admission to 2,562. He was without chest pain and EKG was stable. Troponin trend significant 2,562 >> 2,467 >> 2,801 >> 3,044 >> 2,813. Initially attributed to possible rhabdo as pt was down at home for an unknown amount of time. However, CKMB and relative index were both elevated with a total CK within normal range indicative of a cardiac source. Cardiology was consulted, who did not find evidence of  clinical stigmata of ACS. Given pt's advanced age and limited medical options (for AAA or AS), they agreed to no further work-up and an early discharge to help improve pt's hospital delirium at home.  Pt was discharged on hospital day 1.   Discharge Vitals:   BP (!) 153/82 (BP Location: Left Arm)   Pulse 77   Temp 98.6 F (37 C) (Oral)   Resp (!) 22   Ht 5\' 2"  (1.575 m)   Wt 65.5 kg   SpO2 95%   BMI 26.39 kg/m   Pertinent Labs, Studies, and Procedures:  CBC Latest Ref Rng & Units 09/25/2019 09/24/2019 06/22/2015  WBC 4.0 - 10.5 K/uL 9.6 10.1 8.7  Hemoglobin 13.0 - 17.0 g/dL 12.8(L) 12.7(L) 10.8(L)  Hematocrit 39.0 - 52.0 % 40.5 40.3 32.0(L)  Platelets 150 - 400 K/uL 130(L) 147(L) 179   BMP Latest Ref Rng & Units 09/25/2019 09/24/2019 05/17/2015  Glucose 70 - 99 mg/dL 92 113(H) 105(H)  BUN 8 - 23 mg/dL 18 24(H) 47(H)  Creatinine 0.61 - 1.24 mg/dL 1.11 1.36(H) 1.11  Sodium 135 - 145 mmol/L 138 136 137  Potassium 3.5 - 5.1 mmol/L 3.9 4.2 4.1  Chloride 98 - 111 mmol/L 101 100 107  CO2 22 - 32 mmol/L 25 25 27   Calcium 8.9 - 10.3 mg/dL 8.9 9.1 7.9(L)   Lab Results  Component Value Date   CKTOTAL 315 09/25/2019   CKMB 12.4 (H) 09/25/2019   TROPONINI <0.03 03/10/2015    High sensitivity troponin 2,813 << 3,044 << 2,801 << 2,467 << 2,562   EKG Interpretation  Date/Time:  Saturday September 24 2019 14:16:30 EST Ventricular Rate:  91 PR Interval:    QRS Duration: 154 QT Interval:  429 QTC Calculation: 528 R Axis:   -65 Text Interpretation: Atrial-sensed ventricular-paced rhythm No further analysis attempted due to paced rhythm No significant change since prior 8/16 Confirmed by Aletta Edouard [phone]) on 09/25/2019 1:18:37 PM       CXR 09/24/2019 CLINICAL DATA:  Fall  EXAM: PORTABLE CHEST 1 VIEW  COMPARISON:  2016  FINDINGS: Likely chronic background interstitial prominence. Patchy left mid and lower lung density. Mild pulmonary vascular congestion. Small left pleural  effusion. Similar cardiomediastinal contours. Endovascular valve replacement is again noted.  IMPRESSION: Mild pulmonary vascular congestion. Patchy left mid and lower lung atelectasis/consolidation. Small left pleural effusion.   CT HEAD 09/24/2019 CLINICAL DATA:  Headache, questionable fall.  EXAM: CT HEAD WITHOUT CONTRAST  TECHNIQUE: Contiguous axial images were obtained from the base of the skull through the vertex without intravenous contrast.  COMPARISON:  None.  FINDINGS: Brain: There is atrophy and chronic small vessel disease changes. No acute intracranial abnormality. Specifically, no hemorrhage, hydrocephalus, mass lesion, acute infarction, or significant intracranial injury.  Vascular: No hyperdense vessel or unexpected calcification.  Skull: No acute calvarial abnormality.  Sinuses/Orbits: Visualized paranasal sinuses and mastoids clear. Orbital soft  tissues unremarkable.  Other: None  IMPRESSION: Atrophy, chronic microvascular disease.  No acute intracranial abnormality.  Discharge Instructions: Discharge Instructions    Call MD for:  difficulty breathing, headache or visual disturbances   Complete by: As directed    Call MD for:  extreme fatigue   Complete by: As directed    Call MD for:  hives   Complete by: As directed    Call MD for:  persistant dizziness or light-headedness   Complete by: As directed    Call MD for:  persistant nausea and vomiting   Complete by: As directed    Call MD for:  redness, tenderness, or signs of infection (pain, swelling, redness, odor or green/yellow discharge around incision site)   Complete by: As directed    Call MD for:  severe uncontrolled pain   Complete by: As directed    Call MD for:  temperature >100.4   Complete by: As directed    Diet - low sodium heart healthy   Complete by: As directed    Discharge instructions   Complete by: As directed    Jordan [name], Jordan [name] were seen in the  hospital after an episode of confusion overnight at home. Work-up in the hospital did not show any signs of strokes, seizures, infections, or any electrolyte abnormalities. A heart marker measured in the blood was noted to be a bit elevated. Cardiology does not believe that to be related to the confusion.   Please continue to follow-up with your cardiologist in clinic and have supervision over the next 2-3 days while you heal after leaving the hospital. A referral has been placed for physical therapy come to your house to help you with your strength and mobility.   Thank you for letting us be a part of your care!   Increase activity slowly   Complete by: As directed       Signed: Ladona Horns, MD 09/25/2019, 2:42 PM   Pager: [phone]

## 2019-09-25 NOTE — Evaluation (Signed)
Physical Therapy Evaluation Patient Details Name: Jordan [name] MRN: [id number] DOB: [birth year] Today's Date: 09/25/2019   History of Present Illness  Mr. Eberl is a 86 y/o gentleman with history of AS s/p TAVR, CHB s/p PPM, dyslipidemia, HTN who presents following an unwitnessed syncopal event at home. pMH: impaired vision, AAA, arthritis in knees, basal cell carcinoma of skin, pacemaker, s/p TAVR HTN    Clinical Impression  Pt admitted with above. Pt extremely HOH which could be contributing to comprehension of tasks/questions asked to patient. Patient however presenting with impaired sequencing, safety awareness, and confusion. Pt unaware of what brought him to the hospital and doesn't remember what happened at home. Per family pt was indep and driving PTA. Pt no requiring min/modA for all mobility and ADLs. Pt will need 24/7 assist to be d/c'd home safely once medically stable. Pt's son and dtr in law stated they can stay with him or he can stay with them. Pt to benefit from HHPT to progress back to indep mobility. Acute PT to cont to follow.    Follow Up Recommendations Home health PT;Supervision/Assistance - 24 hour    Equipment Recommendations  None recommended by PT(has DME)    Recommendations for Other Services       Precautions / Restrictions Precautions Precautions: Fall Precaution Comments: extremely HOH, HR between 89-130s bpm Restrictions Weight Bearing Restrictions: No      Mobility  Bed Mobility Overal bed mobility: Needs Assistance Bed Mobility: Supine to Sit     Supine to sit: HOB elevated;Mod assist     General bed mobility comments: max directional verbal and tactile cues to complete task, suspect combination of HOH and impaired sequencing  Transfers Overall transfer level: Needs assistance Equipment used: 1 person hand held assist Transfers: Sit to/from Stand Sit to Stand: Min assist         General transfer comment: max directional verbal  and tactile cues, increased time, slow and guarded  Ambulation/Gait Ambulation/Gait assistance: Min assist;Mod assist Gait Distance (Feet): 12 Feet Assistive device: 1 person hand held assist Gait Pattern/deviations: Step-to pattern;Decreased stride length;Wide base of support;Trunk flexed Gait velocity: slow Gait velocity interpretation: <1.8 ft/sec, indicate of risk for recurrent falls General Gait Details: decreased step height and length, max directional verbal cues, slow, guarded and unsteady, would benefit from [employer]            Wheelchair Mobility    Modified Rankin (Stroke Patients Only)       Balance Overall balance assessment: Needs assistance Sitting-balance support: Feet unsupported;No upper extremity supported Sitting balance-Leahy Scale: Poor Sitting balance - Comments: posterior lean, worse when trying to don socks   Standing balance support: Single extremity supported Standing balance-Leahy Scale: Fair Standing balance comment: pt stood at sink with OT to brush teeth and dentures, pt placed elbow on sink to steady self                             Pertinent Vitals/Pain Pain Assessment: Faces Faces Pain Scale: No hurt    Home Living Family/patient expects to be discharged to:: Private residence Living Arrangements: Alone(but can stay with son & dtr in law or they can stay with him) Available Help at Discharge: Family;Available 24 hours/day Type of Home: House Home Access: Stairs to enter Entrance Stairs-Rails: Can reach both Entrance Stairs-Number of Steps: 5 Home Layout: One level Home Equipment: Walker - 2 wheels;Cane - single point;Shower seat;Grab bars -  tub/shower      Prior Function Level of Independence: Independent         Comments: amb without AD typically, was driving and doing grocery shopping and going to fast food restaurants, son started doing it more since COVID     Hand Dominance   Dominant Hand: Right     Extremity/Trunk Assessment   Upper Extremity Assessment Upper Extremity Assessment: Defer to OT evaluation    Lower Extremity Assessment Lower Extremity Assessment: Generalized weakness    Cervical / Trunk Assessment Cervical / Trunk Assessment: Kyphotic  Communication   Communication: HOH(extremely HOH)  Cognition Arousal/Alertness: Awake/alert Behavior During Therapy: WFL for tasks assessed/performed Overall Cognitive Status: Impaired/Different from baseline Area of Impairment: Orientation;Memory;Following commands;Safety/judgement;Problem solving;Awareness                 Orientation Level: Disoriented to;Time;Situation(stated hospital and [place], doesn't know why hes here)   Memory: Decreased short-term memory Following Commands: Follows one step commands with increased time Safety/Judgement: Decreased awareness of safety;Decreased awareness of deficits Awareness: Emergent Problem Solving: Slow processing;Difficulty sequencing;Requires verbal cues;Requires tactile cues General Comments: requiring max verbal and tactile cues to complete tasks, pt HOH could be contributing to that      General Comments General comments (skin integrity, edema, etc.): pt with irritation and redness in L eye, some scabs and bruising on face and LEs    Exercises     Assessment/Plan    PT Assessment Patient needs continued PT services  PT Problem List Decreased strength;Decreased range of motion;Decreased activity tolerance;Decreased balance;Decreased mobility;Decreased coordination;Decreased cognition;Decreased knowledge of use of DME;Decreased safety awareness       PT Treatment Interventions      PT Goals (Current goals can be found in the Care Plan section)  Acute Rehab PT Goals Patient Stated Goal: didn't state PT Goal Formulation: With patient Time For Goal Achievement: 10/09/19 Potential to Achieve Goals: Good    Frequency Min 3X/week   Barriers to discharge         Co-evaluation PT/OT/SLP Co-Evaluation/Treatment: Yes Reason for Co-Treatment: Necessary to address cognition/behavior during functional activity PT goals addressed during session: Mobility/safety with mobility         AM-PAC PT "6 Clicks" Mobility  Outcome Measure Help needed turning from your back to your side while in a flat bed without using bedrails?: A Little Help needed moving from lying on your back to sitting on the side of a flat bed without using bedrails?: A Little Help needed moving to and from a bed to a chair (including a wheelchair)?: A Little Help needed standing up from a chair using your arms (e.g., wheelchair or bedside chair)?: A Little Help needed to walk in hospital room?: A Little Help needed climbing 3-5 steps with a railing? : A Lot 6 Click Score: 17    End of Session Equipment Utilized During Treatment: Gait belt Activity Tolerance: Patient tolerated treatment well Patient left: in chair;with call bell/phone within reach;with chair alarm set Nurse Communication: Mobility status PT Visit Diagnosis: Unsteadiness on feet (R26.81);Muscle weakness (generalized) (M62.81);Difficulty in walking, not elsewhere classified (R26.2)    Time: [id number] PT Time Calculation (min) (ACUTE ONLY): 37 min   Charges:   PT Evaluation $PT Eval Moderate Complexity: 1 Mod          Kittie Plater, PT, DPT Acute Rehabilitation Services Pager #: [phone] Office #: [phone]   Berline Lopes 09/25/2019, 9:11 AM

## 2019-09-29 ENCOUNTER — Ambulatory Visit: Payer: Medicare Other | Attending: Internal Medicine

## 2019-09-29 DIAGNOSIS — Z23 Encounter for immunization: Secondary | ICD-10-CM | POA: Insufficient documentation

## 2019-09-29 NOTE — Progress Notes (Signed)
   Covid-19 Vaccination Clinic  Name:  Jordan [name]    MRN: [id number] DOB: 03-21-1918  09/29/2019  Mr. Sonderegger was observed post Covid-19 immunization for 30 minutes based on pre-vaccination screening without incidence. He was provided with Vaccine Information Sheet and instruction to access the V-Safe system.   Mr. Frerking was instructed to call 911 with any severe reactions post vaccine: Marland Kitchen Difficulty breathing  . Swelling of your face and throat  . A fast heartbeat  . A bad rash all over your body  . Dizziness and weakness    Immunizations Administered    Name Date Dose VIS Date Route   Pfizer COVID-19 Vaccine 09/29/2019  9:54 AM 0.3 mL 08/26/2019 Intramuscular   Manufacturer: [place]   Lot: [id number]   [place]: [id number]

## 2019-10-19 ENCOUNTER — Ambulatory Visit: Payer: Medicare Other | Attending: Internal Medicine

## 2019-10-19 DIAGNOSIS — Z23 Encounter for immunization: Secondary | ICD-10-CM | POA: Insufficient documentation

## 2019-10-19 NOTE — Progress Notes (Signed)
   Covid-19 Vaccination Clinic  Name:  Jordan [name]    MRN: [id number] DOB: 03/09/1918  10/19/2019  [name] was observed post Covid-19 immunization for 15 minutes without incidence. He was provided with Vaccine Information Sheet and instruction to access the V-Safe system.   [name] was instructed to call 911 with any severe reactions post vaccine: Marland Kitchen Difficulty breathing  . Swelling of your face and throat  . A fast heartbeat  . A bad rash all over your body  . Dizziness and weakness    Immunizations Administered    Name Date Dose VIS Date Route   Pfizer COVID-19 Vaccine 10/19/2019 10:14 AM 0.3 mL 08/26/2019 Intramuscular   Manufacturer: [place]   Lot: [id number]   [place]: [id number]

## 2019-11-05 ENCOUNTER — Other Ambulatory Visit: Payer: Self-pay | Admitting: Cardiology

## 2019-11-14 ENCOUNTER — Ambulatory Visit: Payer: Medicare Other | Admitting: Cardiology

## 2019-11-14 ENCOUNTER — Telehealth: Payer: Self-pay | Admitting: Cardiology

## 2019-11-14 ENCOUNTER — Encounter: Payer: Self-pay | Admitting: Cardiology

## 2019-11-14 ENCOUNTER — Other Ambulatory Visit: Payer: Self-pay

## 2019-11-14 VITALS — BP 125/66 | HR 73 | Temp 98.2°F | Ht 63.0 in | Wt 159.6 lb

## 2019-11-14 DIAGNOSIS — I2510 Atherosclerotic heart disease of native coronary artery without angina pectoris: Secondary | ICD-10-CM

## 2019-11-14 DIAGNOSIS — I441 Atrioventricular block, second degree: Secondary | ICD-10-CM

## 2019-11-14 DIAGNOSIS — Z952 Presence of prosthetic heart valve: Secondary | ICD-10-CM

## 2019-11-14 DIAGNOSIS — Z950 Presence of cardiac pacemaker: Secondary | ICD-10-CM

## 2019-11-14 DIAGNOSIS — R609 Edema, unspecified: Secondary | ICD-10-CM

## 2019-11-14 NOTE — Patient Instructions (Signed)

## 2019-11-14 NOTE — Telephone Encounter (Signed)
Pt c/o swelling: STAT is pt has developed SOB within 24 hours  1) How much weight have you gained and in what time span? Not sure  2) If swelling, where is the swelling located? ankles  3) Are you currently taking a fluid pill? no  4) Are you currently SOB? no  5) Do you have a log of your daily weights (if so, list)?   6) Have you gained 3 pounds in a day or 5 pounds in a week? no  7) Have you traveled recently? no   Son of the patient called. Patient called his son and said he has had some significant swelling in his ankles over the past 6 weeks or so. He wanted his son to make him an appt to see Dr. [place] asap. The son wanted to speak with Malachy Mood to see if she had any recommendations

## 2019-11-14 NOTE — Progress Notes (Signed)
Jordan [name] Date of Birth: Dec 30, 1917   History of Present Illness: Jordan [name] is seen today as a work in for evaluation of edema.   He has a history of coronary disease with remote stenting of the LAD in 1999. Cardiac catheterization January 2013 showed nonobstructive disease. He has a history of bradycardia requiring pacemaker implant. He has a history of severe aortic stenosis. He underwent TAVR on 05/15/15.  Follow up Echo in September 2017 looked good. Pacemaker follow up in February 2018 was satisfactory.  He has a 4.6 cm abdominal aortic aneurysm followed  by [facility]- last checked in October 2019. Stable at 4.7 cm.  Not a candidate for stent grafting. He was switched from Nexium to Protonix due to possible interaction with Plavix but protonix made him feel bad so he went back to nexium.   He was admitted in January with  a fall. Had post concussion confusion. Son thinks he may have fallen when he got up to urinate at night without the light on. Was orthostatic. Troponin elevated 2562> 2467. Seen by Dr Lovena Le. No clear evidence of ACS by history. Echo was OK and pacemaker checked out. CT of head without acute change. Felt he had some vagal response related to abdominal pain.  He reports since then he has intermittent swelling of his feet and ankles at night. Usually goes down by morning but often has frequent nocturia when this happens. He does not add salt to his food but does end up getting fast food or eating canned soups    Current Outpatient Medications on File Prior to Visit  Medication Sig Dispense Refill  . acetaminophen (TYLENOL) 325 MG tablet Take 650 mg by mouth every 6 (six) hours as needed for mild pain, moderate pain or fever.     Marland Kitchen amLODipine (NORVASC) 5 MG tablet TAKE 1 TABLET BY MOUTH EVERY DAY (Patient taking differently: Take 5 mg by mouth daily. ) 90 tablet 2  . atorvastatin (LIPITOR) 10 MG tablet Take 1 tablet (10 mg total) by mouth daily. 60 tablet 0  . clopidogrel  (PLAVIX) 75 MG tablet TAKE 1 TABLET BY MOUTH EVERY DAY (Patient taking differently: Take 75 mg by mouth daily. ) 90 tablet 2  . NEXIUM 24HR 20 MG capsule TAKE 1 CAPSULE BY MOUTH DAILY AT 12 NOON 84 capsule 4  . silodosin (RAPAFLO) 4 MG CAPS capsule Take 4 mg by mouth every evening.      No current facility-administered medications on file prior to visit.    Allergies  Allergen Reactions  . Aspirin Other (See Comments)    Stomach bleeds  . Nitroglycerin     Makes patient faint    Past Medical History:  Diagnosis Date  . AAA (abdominal aortic aneurysm) ([place])   . Anemia   . Aortic stenosis   . Arthritis 09/18/11   "in my knees"  . Basal cell carcinoma of skin   . Blood transfusion   . BPH (benign prostatic hyperplasia)   . CAD (coronary artery disease)    Remote stent to LAD in 1999. Last cath in 2004 and he is managed medically  . Colon polyps   . Complication of anesthesia   . Dysrhythmia    paced  . GERD (gastroesophageal reflux disease)   . Hemorrhoid   . HTN (hypertension)   . Hyperlipidemia   . Lung nodule    RLL  . 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 Renal mass,  left   . 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 CARDIAC CATHETERIZATION  2004   Managed medically  . CARDIAC CATHETERIZATION    . CARDIAC CATHETERIZATION N/A 04/11/2015   Procedure: Right/Left Heart Cath and Coronary Angiography;  Surgeon: Burnell Blanks, MD;  Location: [facility];  Service: Cardiovascular;  Laterality: N/A;  . CORONARY ANGIOGRAM  09/19/2011   Procedure: CORONARY ANGIOGRAM;  Surgeon: Josue Hector, MD;  Location: [facility];  Service: Cardiovascular;;  . CORONARY ANGIOPLASTY WITH STENT PLACEMENT  05/09/1998   "1"; LAD  . INSERT / REPLACE / REMOVE PACEMAKER  2004   initial placement;  [employer]  . INSERT / REPLACE / REMOVE PACEMAKER  02/14/2011  . NASAL HEMORRHAGE CONTROL  1980's   Clips placed to stop bleeding  . SKIN CANCER EXCISION  09/18/11   "have had a couple hundred of them removed since I was in my 40's"  . TEE WITHOUT CARDIOVERSION N/A 05/15/2015   Procedure: TRANSESOPHAGEAL ECHOCARDIOGRAM (TEE);  Surgeon: Burnell Blanks, MD;  Location: [facility];  Service: Open Heart Surgery;  Laterality: N/A;  . TONSILLECTOMY     "when I was a teenager"  . TRANSCATHETER AORTIC VALVE REPLACEMENT, TRANSFEMORAL N/A 05/15/2015   Procedure: TRANSCATHETER AORTIC VALVE REPLACEMENT, TRANSFEMORAL;  Surgeon: Burnell Blanks, MD;  Location: [facility];  Service: Open Heart Surgery;  Laterality: N/A;    Social History   Tobacco Use  Smoking Status Former Smoker  . Packs/day: 1.00  . Years: 42.00  . Pack years: 42.00  . Types: Cigarettes  . Quit date: 08/18/1974  . Years since quitting: 45.2  Smokeless Tobacco Former Systems developer  . Types: Chew    Social History   Substance and Sexual Activity  Alcohol Use No  . Alcohol/week: 0.0 standard drinks    Family History  Problem Relation Age of Onset  . Cancer Father        stomach  . Colon cancer Father   . Heart disease Brother   . Heart disease Brother   . Diabetes Other        granddaughter    Review of Systems: As noted in history of present illness. All other systems were reviewed and are negative.  Physical Exam: BP 125/66   Pulse 73   Temp 98.2 F (36.8 C)   Ht 5\' 3"  (1.6 m)   Wt 159 lb 9.6 oz (72.4 kg)   SpO2 96%   BMI 28.27 kg/m  GENERAL:  Well appearing elderly WM in NAD HEENT:  PERRL, EOMI, sclera are clear. Oropharynx is clear. NECK:  No jugular venous distention, carotid upstroke brisk and symmetric, no bruits, no thyromegaly or adenopathy LUNGS:  Clear to auscultation bilaterally CHEST:  Unremarkable HEART:  RRR,  PMI not displaced or sustained,S1 and S2 within normal limits, no S3, no S4: no clicks, no  rubs, gr 2/6 systolic murmur. ABD:  Soft, nontender. BS +, no masses or bruits. No hepatomegaly, no splenomegaly EXT:  2 + pulses throughout, no edema, no cyanosis no clubbing SKIN:  Warm and dry.  No rashes NEURO:  Alert and oriented x 3. Cranial nerves II through XII intact. PSYCH:  Cognitively intact    LABORATORY DATA: Lab Results  Component Value Date   WBC 9.6 09/25/2019   HGB 12.8 (L) 09/25/2019   HCT  40.5 09/25/2019   PLT 130 (L) 09/25/2019   GLUCOSE 92 09/25/2019   CHOL 104 09/19/2011   TRIG 66 09/19/2011   HDL 38 (L) 09/19/2011   LDLCALC 53 09/19/2011   ALT 20 09/24/2019   AST 41 09/24/2019   NA 138 09/25/2019   K 3.9 09/25/2019   CL 101 09/25/2019   CREATININE 1.11 09/25/2019   BUN 18 09/25/2019   CO2 25 09/25/2019   TSH 0.797 09/18/2011   INR 1.1 09/24/2019   HGBA1C 5.5 05/11/2015   Labs dated 07/05/18: Normal CBC and CMET. Cholesterol 120, triglycerides 131, HDL 36, LDL 58.   Echo: 05/28/16:  Study Conclusions  - Left ventricle: The cavity size was mildly dilated. Wall   thickness was increased in a pattern of moderate LVH. Systolic   function was normal. The estimated ejection fraction was in the   range of 60% to 65%. Doppler parameters are consistent with   abnormal left ventricular relaxation (grade 1 diastolic   dysfunction). - Aortic valve: 26 mm Sapien 3 valve in excellent position with no   perivalvular regurgitation and stable gradients. - Mitral valve: Calcified annulus. Moderately thickened leaflets .   There was mild regurgitation. Valve area by pressure half-time:   2.5 cm^2. - Left atrium: The atrium was moderately dilated. - Atrial septum: No defect or patent foramen ovale was identified.  Echo 09/25/19:IMPRESSIONS    1. Left ventricular ejection fraction, by visual estimation, is 60 to  65%. The left ventricle has normal function. There is mildly increased  left ventricular hypertrophy.  2. Elevated left atrial pressure.  3.  Left ventricular diastolic parameters are consistent with Grade I  diastolic dysfunction (impaired relaxation).  4. The left ventricle has no regional wall motion abnormalities.  5. Global right ventricle has normal systolic function.The right  ventricular size is normal.  6. Left atrial size was mildly dilated.  7. Right atrial size was normal.  8. Severe mitral annular calcification.  9. The mitral valve is normal in structure. Mild mitral valve  regurgitation. No evidence of mitral stenosis.  10. The tricuspid valve is normal in structure.  11. The aortic valve is normal in structure. Aortic valve regurgitation is  not visualized. No evidence of aortic valve sclerosis or stenosis.  12. The pulmonic valve was normal in structure. Pulmonic valve  regurgitation is not visualized.  13. The inferior vena cava is normal in size with greater than 50%  respiratory variability, suggesting right atrial pressure of 3 mmHg.  14. Normal LV systolic function; mild LVH; grade 1 diastolic dysfunction;  s/p TAVR with mean gradient 10 mmHg; AVA 1.8 cm2) and no AI; mild LAE;  mild MR.   Assessment / Plan: 1. Coronary disease with remote stenting of the LAD in 1999. Cardiac catheterization Jan 2013 showed nonobstructive disease. He has no significant angina. We will continue with Plavix and statin therapy.  2. Severe aortic stenosis.s/p TAVR in August 2016. Excellent result. Follow up Echo in January 2021 looked good. Exam is stable.    3. Sinus node dysfunction with bradycardia. Status post pacemaker implant. Pacemaker followup in Jan. was satisfactory. Follow up in pacer clinic.  4. Hyperlipidemia. On chronic Crestor therapy. Excellent control. LDL 58.   5. AAA 4.7 cm. Followed by [facility]. Not a candidate for stent grafting. He is a poor candidate for open repair. Last [place] October 2019 was stable.   6. Renal mass. Followed by urology. Patient reports this is stable/smaller.   7. Edema.  This is  directly related to sodium intake. Given his frail state I would really like to avoid putting him on a diuretic. His ankles look good today. Reinforced need for a low sodium diet.   I will follow up in 4 months.

## 2019-11-14 NOTE — Telephone Encounter (Signed)
Returned call to patient's [name].He stated father has been having increased swelling in both lower legs and ankles for the past 6 to 7 weeks.Stated he has been keeping feet elevated,low salt diet.Stated usually after he keeps feet elevated for a couple of days swelling will go down,Stated this time swelling will not go down.He has noticed father sob when he is up walking around.No extender appointments available.Appointment scheduled with Dr.Jordan this afternoon at 4:30 pm.

## 2019-11-16 ENCOUNTER — Ambulatory Visit: Payer: Medicare Other | Admitting: Cardiology

## 2019-12-06 ENCOUNTER — Ambulatory Visit (INDEPENDENT_AMBULATORY_CARE_PROVIDER_SITE_OTHER): Payer: Medicare Other | Admitting: *Deleted

## 2019-12-06 DIAGNOSIS — I441 Atrioventricular block, second degree: Secondary | ICD-10-CM | POA: Diagnosis not present

## 2019-12-06 LAB — CUP PACEART REMOTE DEVICE CHECK
Battery Impedance: 2784 Ohm
Battery Remaining Longevity: 20 mo
Battery Voltage: 2.7 V
Brady Statistic AP VP Percent: 10 %
Brady Statistic AP VS Percent: 0 %
Brady Statistic AS VP Percent: 89 %
Brady Statistic AS VS Percent: 1 %
Date Time Interrogation Session: 20210323093037
Implantable Lead Implant Date: 20040811
Implantable Lead Implant Date: 20040811
Implantable Lead Location: 753859
Implantable Lead Location: 753860
Implantable Lead Model: 4469
Implantable Lead Model: 4470
Implantable Lead Serial Number: 421267
Implantable Lead Serial Number: 436022
Implantable Pulse Generator Implant Date: 20120601
Lead Channel Impedance Value: 478 Ohm
Lead Channel Impedance Value: 491 Ohm
Lead Channel Pacing Threshold Amplitude: 0.875 V
Lead Channel Pacing Threshold Amplitude: 1 V
Lead Channel Pacing Threshold Pulse Width: 0.4 ms
Lead Channel Pacing Threshold Pulse Width: 0.4 ms
Lead Channel Setting Pacing Amplitude: 2 V
Lead Channel Setting Pacing Amplitude: 2.5 V
Lead Channel Setting Pacing Pulse Width: 0.4 ms
Lead Channel Setting Sensing Sensitivity: 1 mV

## 2019-12-07 NOTE — Progress Notes (Signed)
PPM Remote  

## 2020-01-02 ENCOUNTER — Telehealth: Payer: Self-pay | Admitting: Cardiology

## 2020-01-02 MED ORDER — ATORVASTATIN CALCIUM 10 MG PO TABS: 10.0000 mg | ORAL_TABLET | Freq: Every day | ORAL | 3 refills | Status: DC

## 2020-01-02 NOTE — Telephone Encounter (Signed)
Spoke to patient's daughter n law Manus Gunning.She stated patient has had increased swelling in both lower legs since his last office visit with Dr.Jordan 11/14/19.Stated left leg is worse.No sob. Stated he is trying to watch salt intake but does eat out several times a week.Dr.Jordan out of office this week.Appointment scheduled with Almyra Deforest PA 4/20 at 8:45 am.

## 2020-01-02 NOTE — Telephone Encounter (Signed)
Pt c/o swelling: STAT is pt has developed SOB within 24 hours  1) How much weight have you gained and in what time span? Patient's daughter in law states she is not sure.  2) If swelling, where is the swelling located? Both feet are swollen. Left leg in swollen to patient's calf, per patients daughter in law  3) Are you currently taking a fluid pill? No  4) Are you currently SOB? Patient's daughter in law states the patient has been a little SOB  5) Do you have a log of your daily weights (if so, list)? No  6) Have you gained 3 pounds in a day or 5 pounds in a week? No  7) Have you traveled recently? No

## 2020-01-03 ENCOUNTER — Ambulatory Visit: Payer: Medicare Other | Admitting: Physician Assistant

## 2020-01-03 ENCOUNTER — Encounter: Payer: Self-pay | Admitting: Physician Assistant

## 2020-01-03 ENCOUNTER — Other Ambulatory Visit: Payer: Self-pay

## 2020-01-03 VITALS — BP 128/66 | HR 94 | Temp 97.8°F | Ht 63.0 in | Wt 155.8 lb

## 2020-01-03 DIAGNOSIS — Z950 Presence of cardiac pacemaker: Secondary | ICD-10-CM

## 2020-01-03 DIAGNOSIS — R600 Localized edema: Secondary | ICD-10-CM

## 2020-01-03 DIAGNOSIS — I2510 Atherosclerotic heart disease of native coronary artery without angina pectoris: Secondary | ICD-10-CM | POA: Diagnosis not present

## 2020-01-03 DIAGNOSIS — Z953 Presence of xenogenic heart valve: Secondary | ICD-10-CM

## 2020-01-03 MED ORDER — FUROSEMIDE 20 MG PO TABS: ORAL_TABLET | ORAL | 3 refills | Status: DC

## 2020-01-03 NOTE — Patient Instructions (Addendum)
Medication Instructions:   START Lasix 20 mg daily for 2 days, then take as needed for leg swelling   *If you need a refill on your cardiac medications before your next appointment, please call your pharmacy*  Lab Work: NONE ordered at this time of appointment   If you have labs (blood work) drawn today and your tests are completely normal, you will receive your results only by: Marland Kitchen MyChart Message (if you have MyChart) OR . A paper copy in the mail If you have any lab test that is abnormal or we need to change your treatment, we will call you to review the results.  Testing/Procedures: Your physician has requested that you have a lower extremity venous duplex. This test is an ultrasound of the veins in the legs. It looks at venous blood flow that carries blood from the heart to the legs or arms. Allow one hour for a Lower Venous exam. Allow thirty minutes for an Upper Venous exam. There are no restrictions or special instructions.   Please schedule for 1-2 weeks   Follow-Up: At [facility], you and your health needs are our priority.  As part of our continuing mission to provide you with exceptional heart care, we have created designated Provider Care Teams.  These Care Teams include your primary Cardiologist (physician) and Advanced Practice Providers (APPs -  Physician Assistants and Nurse Practitioners) who all work together to provide you with the care you need, when you need it.   Your next appointment:   1 week(s)  The format for your next appointment:   In Person  Provider:   Almyra Deforest, PA-C  Other Instructions   For hydration your fluid intake should be a minimum of 32 oz to maximum of 48 oz daily

## 2020-01-03 NOTE — Progress Notes (Signed)
Cardiology Office Note:    Date:  01/05/2020   ID:  Jordan [name], DOB May 11, 1918, MRN [id number]  PCP:  Jordan Battles, MD  Cardiologist:  Jordan Martinique, MD  Electrophysiologist:  None   Referring MD: Jordan Battles, MD   Chief Complaint  Patient presents with  . Follow-up    seen for Jordan [name]    History of Present Illness:    Jordan [name] is a 86 y.o. male with a hx of CAD s/p remote stenting LAD 1999, HTN, HLD, AAA, h/o bradycardia s/p pacemaker and h/o severe s/p TAVR.  Cardiac catheterization in January 2013 showed nonobstructive disease.  He underwent TAVR on 05/15/2015.  Cardiac catheterization performed prior to the procedure on 04/11/2015 showed 30% proximal RCA, 40% proximal LAD, 30% proximal left circumflex lesion, severe aortic valve stenosis.  Echocardiogram in September 2017 looks good.  He has a history of abdominal aortic aneurysm followed by vascular surgery.  He was admitted in January 2021 with a fall and had postconcussion confusion.  Family states that he likely had fallen when he tried to get up to urinate at night without the light on.  He was also orthostatic as well.  Troponin elevated at 2562.  He was seen by Jordan [name], given no clear evidence of ACS by history, and normal echocardiogram, it was felt the patient likely had some vagal response.  As mentioned above, echocardiogram obtained on 09/25/2019 showed EF 60 to 65%, grade 1 DD, mild MR.  He was last seen by Jordan [name] on 11/14/2019 at which time Jordan [name] noted he had some ankle swelling.  He felt the patient likely has dietary indiscretion and he was hesitant to add a diuretic given his advanced age.  Patient presents today accompanied by his daughter.  He also has had some swelling in the lower extremity improved with leg elevation.  However in the past several weeks, he started having increasing left lower extremity swelling.  On physical exam, he has at least 2-3+ pitting edema on the left lower  extremity.  I recommend a venous Doppler to make sure he does not have any DVT.  Otherwise I suspect this is related to volume overload and the dietary indiscretion.  Unfortunately since he lives alone, he mainly ordered food from [employer].  Therefore he cannot control how much salt he taking.  I recommended 20 mg daily of Lasix for 2 days before changing this to as needed.  I plan to see the patient back in 1 week for reassessment.   Past Medical History:  Diagnosis Date  . AAA (abdominal aortic aneurysm) ([place])   . Anemia   . Aortic stenosis   . Arthritis 09/18/11   "in my knees"  . Basal cell carcinoma of skin   . Blood transfusion   . BPH (benign prostatic hyperplasia)   . CAD (coronary artery disease)    Remote stent to LAD in 1999. Last cath in 2004 and he is managed medically  . Colon polyps   . Complication of anesthesia   . Dysrhythmia    paced  . GERD (gastroesophageal reflux disease)   . Hemorrhoid   . HTN (hypertension)   . Hyperlipidemia   . Lung nodule    RLL  . 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 Renal mass, left   . 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   26 mm Edwards Sapien 3 transcatheter heart valve placed via open left  transfemoral approach    Past Surgical History:  Procedure Laterality Date  . CARDIAC CATHETERIZATION  2004   Managed medically  . CARDIAC CATHETERIZATION    . CARDIAC CATHETERIZATION N/A 04/11/2015   Procedure: Right/Left Heart Cath and Coronary Angiography;  Surgeon: Burnell Blanks, MD;  Location: [facility];  Service: Cardiovascular;  Laterality: N/A;  . CORONARY ANGIOGRAM  09/19/2011   Procedure: CORONARY ANGIOGRAM;  Surgeon: Josue Hector, MD;  Location: [facility] CATH LAB;  Service: Cardiovascular;;  . CORONARY ANGIOPLASTY WITH STENT PLACEMENT  05/09/1998   "1"; LAD  . INSERT / REPLACE / REMOVE PACEMAKER  2004   initial placement; [employer]  . INSERT / REPLACE / REMOVE PACEMAKER   02/14/2011  . NASAL HEMORRHAGE CONTROL  1980's   Clips placed to stop bleeding  . SKIN CANCER EXCISION  09/18/11   "have had a couple hundred of them removed since I was in my 40's"  . TEE WITHOUT CARDIOVERSION N/A 05/15/2015   Procedure: TRANSESOPHAGEAL ECHOCARDIOGRAM (TEE);  Surgeon: Burnell Blanks, MD;  Location: [facility];  Service: Open Heart Surgery;  Laterality: N/A;  . TONSILLECTOMY     "when I was a teenager"  . TRANSCATHETER AORTIC VALVE REPLACEMENT, TRANSFEMORAL N/A 05/15/2015   Procedure: TRANSCATHETER AORTIC VALVE REPLACEMENT, TRANSFEMORAL;  Surgeon: Burnell Blanks, MD;  Location: [place] [facility];  Service: Open Heart Surgery;  Laterality: N/A;    Current Medications: Current Meds  Medication Sig  . acetaminophen (TYLENOL) 325 MG tablet Take 650 mg by mouth every 6 (six) hours as needed for mild pain, moderate pain or fever.   Marland Kitchen amLODipine (NORVASC) 5 MG tablet TAKE 1 TABLET BY MOUTH EVERY DAY (Patient taking differently: Take 5 mg by mouth daily. )  . atorvastatin (LIPITOR) 10 MG tablet Take 1 tablet (10 mg total) by mouth daily.  . clopidogrel (PLAVIX) 75 MG tablet TAKE 1 TABLET BY MOUTH EVERY DAY (Patient taking differently: Take 75 mg by mouth daily. )  . NEXIUM 24HR 20 MG capsule TAKE 1 CAPSULE BY MOUTH DAILY AT 12 NOON  . silodosin (RAPAFLO) 4 MG CAPS capsule Take 4 mg by mouth every evening.      Allergies:   Aspirin and Nitroglycerin   Social History   Socioeconomic History  . Marital status: Widowed    Spouse name: Not on file  . Number of children: 2  . Years of education: Not on file  . Highest education level: Not on file  Occupational History  . Occupation: Retired    Comment: Building control surveyor  Tobacco Use  . Smoking status: Former Smoker    Packs/day: 1.00    Years: 42.00    Pack years: 42.00    Types: Cigarettes    Quit date: 08/18/1974    Years since quitting: 45.4  . Smokeless tobacco: Former Systems developer    Types: Chew  Substance and Sexual Activity  . Alcohol  use: No    Alcohol/week: 0.0 standard drinks  . Drug use: No  . Sexual activity: Yes  Other Topics Concern  . Not on file  Social History Narrative  . Not on file   Social Determinants of Health   Financial Resource Strain:   . Difficulty of Paying Living Expenses:   Food Insecurity:   . Worried About Charity fundraiser in the Last Year:   . Arboriculturist in the Last Year:   Transportation Needs:   . Film/video editor (Medical):   Marland Kitchen Lack of  Transportation (Non-Medical):   Physical Activity:   . Days of Exercise per Week:   . Minutes of Exercise per Session:   Stress:   . Feeling of Stress :   Social Connections:   . Frequency of Communication with Friends and Family:   . Frequency of Social Gatherings with Friends and Family:   . Attends Religious Services:   . Active Member of Clubs or Organizations:   . Attends Archivist Meetings:   Marland Kitchen Marital Status:      Family History: The patient's family history includes Cancer in his father; Colon cancer in his father; Diabetes in an other family member; Heart disease in his brother and brother.  ROS:   Please see the history of present illness.     All other systems reviewed and are negative.  EKGs/Labs/Other Studies Reviewed:    The following studies were reviewed today:  Echo 09/25/2019 1. Left ventricular ejection fraction, by visual estimation, is 60 to  65%. The left ventricle has normal function. There is mildly increased  left ventricular hypertrophy.  2. Elevated left atrial pressure.  3. Left ventricular diastolic parameters are consistent with Grade I  diastolic dysfunction (impaired relaxation).  4. The left ventricle has no regional wall motion abnormalities.  5. Global right ventricle has normal systolic function.The right  ventricular size is normal.  6. Left atrial size was mildly dilated.  7. Right atrial size was normal.  8. Severe mitral annular calcification.  9. The mitral  valve is normal in structure. Mild mitral valve  regurgitation. No evidence of mitral stenosis.  10. The tricuspid valve is normal in structure.  11. The aortic valve is normal in structure. Aortic valve regurgitation is  not visualized. No evidence of aortic valve sclerosis or stenosis.  12. The pulmonic valve was normal in structure. Pulmonic valve  regurgitation is not visualized.  13. The inferior vena cava is normal in size with greater than 50%  respiratory variability, suggesting right atrial pressure of 3 mmHg.  14. Normal LV systolic function; mild LVH; grade 1 diastolic dysfunction;  s/p TAVR with mean gradient 10 mmHg; AVA 1.8 cm2) and no AI; mild LAE;  mild MR.   EKG:  EKG is not ordered today.    Recent Labs: 09/24/2019: ALT 20 09/25/2019: BUN 18; Creatinine, Ser 1.11; Hemoglobin 12.8; Platelets 130; Potassium 3.9; Sodium 138  Recent Lipid Panel    Component Value Date/Time   CHOL 104 09/19/2011 0135   TRIG 66 09/19/2011 0135   HDL 38 (L) 09/19/2011 0135   CHOLHDL 2.7 09/19/2011 0135   VLDL 13 09/19/2011 0135   LDLCALC 53 09/19/2011 0135    Physical Exam:    VS:  BP 128/66   Pulse 94   Temp 97.8 F (36.6 C) Comment (Src): Forehead  Ht 5\' 3"  (1.6 m)   Wt 155 lb 12.8 oz (70.7 kg)   SpO2 97%   BMI 27.60 kg/m     Wt Readings from Last 3 Encounters:  01/03/20 155 lb 12.8 oz (70.7 kg)  11/14/19 159 lb 9.6 oz (72.4 kg)  09/24/19 144 lb 4.8 oz (65.5 kg)     GEN:  Well nourished, well developed in no acute distress HEENT: Normal NECK: No JVD; No carotid bruits LYMPHATICS: No lymphadenopathy CARDIAC: RRR, no murmurs, rubs, gallops RESPIRATORY:  Clear to auscultation without rales, wheezing or rhonchi  ABDOMEN: Soft, non-tender, non-distended MUSCULOSKELETAL:  No edema; No deformity  SKIN: Warm and dry NEUROLOGIC:  Alert and  oriented x 3 PSYCHIATRIC:  Normal affect   ASSESSMENT:    1. Edema leg   2. Coronary artery disease involving native coronary artery  of native heart without angina pectoris   3. Pacemaker   4. S/p TAVR (transcatheter aortic valve replacement), bioprosthetic    PLAN:    In order of problems listed above:  1. Lower extremity edema: On exam, he had at least 2-3+ pitting edema in the lateral lower extremity.  I will order a venous Doppler to make sure he does not have any DVT.  I suspect this is probably more volume overload.  I decided to start the patient on Lasix 20 mg for 2 days then change to as needed afterward.  I am hesitant to place him on scheduled diuretic given his advanced age  30. CAD: Denies any recent chest pain.  On Plavix and Lipitor.  3. History of pacemaker: Followed by EP  4. History of TAVR: Stable on recent echocardiogram in January 2021.   Medication Adjustments/Labs and Tests Ordered: Current medicines are reviewed at length with the patient today.  Concerns regarding medicines are outlined above.  Orders Placed This Encounter  Procedures  . VAS [place] LOWER EXTREMITY VENOUS (DVT)   Meds ordered this encounter  Medications  . furosemide (LASIX) 20 MG tablet    Sig: Take 1 tablet (20 mg) daily for 2 days, then as needed for leg swelling    Dispense:  30 tablet    Refill:  3    Patient Instructions  Medication Instructions:   START Lasix 20 mg daily for 2 days, then take as needed for leg swelling   *If you need a refill on your cardiac medications before your next appointment, please call your pharmacy*  Lab Work: NONE ordered at this time of appointment   If you have labs (blood work) drawn today and your tests are completely normal, you will receive your results only by: Marland Kitchen MyChart Message (if you have MyChart) OR . A paper copy in the mail If you have any lab test that is abnormal or we need to change your treatment, we will call you to review the results.  Testing/Procedures: Your physician has requested that you have a lower extremity venous duplex. This test is an ultrasound of  the veins in the legs. It looks at venous blood flow that carries blood from the heart to the legs or arms. Allow one hour for a Lower Venous exam. Allow thirty minutes for an Upper Venous exam. There are no restrictions or special instructions.   Please schedule for 1-2 weeks   Follow-Up: At [facility], you and your health needs are our priority.  As part of our continuing mission to provide you with exceptional heart care, we have created designated Provider Care Teams.  These Care Teams include your primary Cardiologist (physician) and Advanced Practice Providers (APPs -  Physician Assistants and Nurse Practitioners) who all work together to provide you with the care you need, when you need it.   Your next appointment:   1 week(s)  The format for your next appointment:   In Person  Provider:   Almyra Deforest, PA-C  Other Instructions   For hydration your fluid intake should be a minimum of 32 oz to maximum of 48 oz daily     Signed, Almyra Deforest, [place]  01/05/2020 11:08 PM    [place]

## 2020-01-04 ENCOUNTER — Ambulatory Visit (HOSPITAL_COMMUNITY)
Admission: RE | Admit: 2020-01-04 | Discharge: 2020-01-04 | Disposition: A | Discharge status: Home / Self Care | Payer: Medicare Other | Source: Ambulatory Visit | Attending: Cardiology | Admitting: Cardiology

## 2020-01-04 DIAGNOSIS — R600 Localized edema: Secondary | ICD-10-CM | POA: Diagnosis not present

## 2020-01-05 ENCOUNTER — Encounter: Payer: Self-pay | Admitting: Physician Assistant

## 2020-01-10 NOTE — Progress Notes (Signed)
No DVT

## 2020-01-11 ENCOUNTER — Ambulatory Visit: Payer: Medicare Other | Admitting: Physician Assistant

## 2020-01-11 ENCOUNTER — Encounter: Payer: Self-pay | Admitting: Physician Assistant

## 2020-01-11 ENCOUNTER — Other Ambulatory Visit: Payer: Self-pay

## 2020-01-11 VITALS — BP 142/70 | HR 90 | Temp 97.9°F | Ht 63.0 in | Wt 155.8 lb

## 2020-01-11 DIAGNOSIS — I2510 Atherosclerotic heart disease of native coronary artery without angina pectoris: Secondary | ICD-10-CM

## 2020-01-11 DIAGNOSIS — I714 Abdominal aortic aneurysm, without rupture, unspecified: Secondary | ICD-10-CM

## 2020-01-11 DIAGNOSIS — Z950 Presence of cardiac pacemaker: Secondary | ICD-10-CM

## 2020-01-11 DIAGNOSIS — R600 Localized edema: Secondary | ICD-10-CM

## 2020-01-11 DIAGNOSIS — I10 Essential (primary) hypertension: Secondary | ICD-10-CM | POA: Diagnosis not present

## 2020-01-11 DIAGNOSIS — Z79899 Other long term (current) drug therapy: Secondary | ICD-10-CM | POA: Diagnosis not present

## 2020-01-11 DIAGNOSIS — Z952 Presence of prosthetic heart valve: Secondary | ICD-10-CM

## 2020-01-11 DIAGNOSIS — E785 Hyperlipidemia, unspecified: Secondary | ICD-10-CM

## 2020-01-11 MED ORDER — FUROSEMIDE 20 MG PO TABS: 20.0000 mg | ORAL_TABLET | Freq: Every day | ORAL | 0 refills | Status: DC

## 2020-01-11 NOTE — Patient Instructions (Addendum)
Medication Instructions:   Lasix is changed to 20 mg daily  *If you need a refill on your cardiac medications before your next appointment, please call your pharmacy*   Lab Work: Your physician recommends that you return for lab work TODAY and in 1 week:  BMET  If you have labs (blood work) drawn today and your tests are completely normal, you will receive your results only by: Marland Kitchen MyChart Message (if you have MyChart) OR . A paper copy in the mail If you have any lab test that is abnormal or we need to change your treatment, we will call you to review the results.   Testing/Procedures: NONE ordered at this time of appointment   Follow-Up: At [facility], you and your health needs are our priority.  As part of our continuing mission to provide you with exceptional heart care, we have created designated Provider Care Teams.  These Care Teams include your primary Cardiologist (physician) and Advanced Practice Providers (APPs -  Physician Assistants and Nurse Practitioners) who all work together to provide you with the care you need, when you need it.  We recommend signing up for the patient portal called "MyChart".  Sign up information is provided on this After Visit Summary.  MyChart is used to connect with patients for Virtual Visits (Telemedicine).  Patients are able to view lab/test results, encounter notes, upcoming appointments, etc.  Non-urgent messages can be sent to your provider as well.   To learn more about what you can do with MyChart, go to [email or web address].    Your next appointment:   3 week(s) 3 months  The format for your next appointment:   In Person In Person  Provider:   Almyra Deforest, PA-C Peter Martinique, MD  Other Instructions

## 2020-01-11 NOTE — Progress Notes (Signed)
Cardiology Office Note:    Date:  01/13/2020   ID:  Jordan [name], DOB [birth year], MRN [id number]  PCP:  Leanna Battles, MD  Cardiologist:  Peter Martinique, MD  Electrophysiologist:  Cristopher Peru, MD   Referring MD: Leanna Battles, MD   Chief Complaint  Patient presents with  . Follow-up    seen for Dr. Martinique, leg edema    History of Present Illness:    Jordan [name] is a 86 y.o. male with a hx of CAD s/p remote stenting LAD 1999, HTN, HLD, AAA, h/o bradycardia s/p pacemaker and h/o severe AS s/p TAVR. Cardiac catheterization in January 2013 showed nonobstructive disease. He underwent TAVR on 05/15/2015.  Cardiac catheterization performed prior to the procedure on 04/11/2015 showed 30% proximal RCA, 40% proximal LAD, 30% proximal left circumflex lesion, severe aortic valve stenosis.  Echocardiogram in September 2017 looks good.  He has a history of abdominal aortic aneurysm followed by vascular surgery.  He was admitted in January 2021 with a fall and had postconcussion confusion.  Family states that he likely had fallen when he tried to get up to urinate at night without the light on.  He was also orthostatic as well.  Troponin elevated at 2562.  He was seen by Dr. Lovena Le, given no clear evidence of ACS by history, and normal echocardiogram, it was felt the patient likely had some vagal response.  As mentioned above, echocardiogram obtained on 09/25/2019 showed EF 60 to 65%, grade 1 DD, mild MR.  He was last seen by Dr. Martinique on 11/14/2019 at which time Dr. Martinique noted he had some ankle swelling.  He felt the patient likely has dietary indiscretion and he was hesitant to add a diuretic given his advanced age.  I last saw the patient on 01/03/2019, he was having increasing lower extremity edema, more so on the left side.  Venous Doppler was negative for DVT.  Given his advanced age, I was hesitant to start him on scheduled diuretic, I only give him 20 mg Lasix for 2 days before change  this to as needed basis.  He returned today for reevaluation.  Patient presents today accompanied by his son.  In the past week, he has taken a total of 4 days of 20 mg Lasix, left lower extremity edema still present.  I recommended increasing Lasix to 20 mg daily.  He is aware to keep fluid intake between 32 to 64 ounces per day.  Lower extremity edema likely related to dietary indiscretion as he cannot control the amount of salt in his food as he eat out quite a lot.  I plan to obtain a basic metabolic panel today and also in 1 week.  I will see him back in 3 weeks for reassessment.  During the meantime, we will continue with leg elevation during the day and also potentially consider a compression stocking.  He can follow-up with Dr. Martinique in 3 months.   Past Medical History:  Diagnosis Date  . AAA (abdominal aortic aneurysm) ([place])   . Anemia   . Aortic stenosis   . Arthritis 09/18/11   "in my knees"  . Basal cell carcinoma of skin   . Blood transfusion   . BPH (benign prostatic hyperplasia)   . CAD (coronary artery disease)    Remote stent to LAD in 1999. Last cath in 2004 and he is managed medically  . Colon polyps   . Complication of anesthesia   . Dysrhythmia  paced  . GERD (gastroesophageal reflux disease)   . Hemorrhoid   . HTN (hypertension)   . Hyperlipidemia   . Lung nodule    RLL  . 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 Renal mass, left   . 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 CARDIAC CATHETERIZATION  2004   Managed medically  . CARDIAC CATHETERIZATION    . CARDIAC CATHETERIZATION N/A 04/11/2015   Procedure: Right/Left Heart Cath and Coronary Angiography;  Surgeon: Burnell Blanks, MD;  Location: [facility];  Service: Cardiovascular;  Laterality: N/A;  . CORONARY  ANGIOGRAM  09/19/2011   Procedure: CORONARY ANGIOGRAM;  Surgeon: Josue Hector, MD;  Location: [facility];  Service: Cardiovascular;;  . CORONARY ANGIOPLASTY WITH STENT PLACEMENT  05/09/1998   "1"; LAD  . INSERT / REPLACE / REMOVE PACEMAKER  2004   initial placement; [employer]  . INSERT / REPLACE / REMOVE PACEMAKER  02/14/2011  . NASAL HEMORRHAGE CONTROL  1980's   Clips placed to stop bleeding  . SKIN CANCER EXCISION  09/18/11   "have had a couple hundred of them removed since I was in my 40's"  . TEE WITHOUT CARDIOVERSION N/A 05/15/2015   Procedure: TRANSESOPHAGEAL ECHOCARDIOGRAM (TEE);  Surgeon: Burnell Blanks, MD;  Location: [facility];  Service: Open Heart Surgery;  Laterality: N/A;  . TONSILLECTOMY     "when I was a teenager"  . TRANSCATHETER AORTIC VALVE REPLACEMENT, TRANSFEMORAL N/A 05/15/2015   Procedure: TRANSCATHETER AORTIC VALVE REPLACEMENT, TRANSFEMORAL;  Surgeon: Burnell Blanks, MD;  Location: [facility];  Service: Open Heart Surgery;  Laterality: N/A;    Current Medications: Current Meds  Medication Sig  . acetaminophen (TYLENOL) 325 MG tablet Take 650 mg by mouth every 6 (six) hours as needed for mild pain, moderate pain or fever.   Marland Kitchen amLODipine (NORVASC) 5 MG tablet TAKE 1 TABLET BY MOUTH EVERY DAY (Patient taking differently: Take 5 mg by mouth daily. )  . atorvastatin (LIPITOR) 10 MG tablet Take 1 tablet (10 mg total) by mouth daily.  . clopidogrel (PLAVIX) 75 MG tablet TAKE 1 TABLET BY MOUTH EVERY DAY (Patient taking differently: Take 75 mg by mouth daily. )  . furosemide (LASIX) 20 MG tablet Take 1 tablet (20 mg total) by mouth daily.  Marland Kitchen NEXIUM 24HR 20 MG capsule TAKE 1 CAPSULE BY MOUTH DAILY AT 12 NOON  . silodosin (RAPAFLO) 4 MG CAPS capsule Take 4 mg by mouth every evening.   . [DISCONTINUED] furosemide (LASIX) 20 MG tablet Take 1 tablet (20 mg) daily for 2 days, then as needed for leg swelling     Allergies:   Aspirin and Nitroglycerin   Social History    Socioeconomic History  . Marital status: Widowed    Spouse name: Not on file  . Number of children: 2  . Years of education: Not on file  . Highest education level: Not on file  Occupational History  . Occupation: Retired    Comment: Building control surveyor  Tobacco Use  . Smoking status: Former Smoker    Packs/day: 1.00    Years: 42.00    Pack years: 42.00    Types: Cigarettes    Quit date: 08/18/1974    Years since quitting: 45.4  . Smokeless tobacco: Former Systems developer    Types:  Chew  Substance and Sexual Activity  . Alcohol use: No    Alcohol/week: 0.0 standard drinks  . Drug use: No  . Sexual activity: Yes  Other Topics Concern  . Not on file  Social History Narrative  . Not on file   Social Determinants of Health   Financial Resource Strain:   . Difficulty of Paying Living Expenses:   Food Insecurity:   . Worried About Charity fundraiser in the Last Year:   . Arboriculturist in the Last Year:   Transportation Needs:   . Film/video editor (Medical):   Marland Kitchen Lack of Transportation (Non-Medical):   Physical Activity:   . Days of Exercise per Week:   . Minutes of Exercise per Session:   Stress:   . Feeling of Stress :   Social Connections:   . Frequency of Communication with Friends and Family:   . Frequency of Social Gatherings with Friends and Family:   . Attends Religious Services:   . Active Member of Clubs or Organizations:   . Attends Archivist Meetings:   Marland Kitchen Marital Status:      Family History: The patient's family history includes Cancer in his father; Colon cancer in his father; Diabetes in an other family member; Heart disease in his brother and brother.  ROS:   Please see the history of present illness.     All other systems reviewed and are negative.  EKGs/Labs/Other Studies Reviewed:    The following studies were reviewed today:  TTE 09/25/2019 1. Left ventricular ejection fraction, by visual estimation, is 60 to 65%. The left ventricle has normal  function. There is mildly increased left ventricular hypertrophy.  2. Elevated left atrial pressure.  3. Left ventricular diastolic parameters are consistent with Grade I diastolic dysfunction (impaired relaxation).  4. The left ventricle has no regional wall motion abnormalities.  5. Global right ventricle has normal systolic function.The right ventricular size is normal.  6. Left atrial size was mildly dilated.  7. Right atrial size was normal.  8. Severe mitral annular calcification.  9. The mitral valve is normal in structure. Mild mitral valve  regurgitation. No evidence of mitral stenosis.  10. The tricuspid valve is normal in structure.  11. The aortic valve is normal in structure. Aortic valve regurgitation is not visualized. No evidence of aortic valve sclerosis or stenosis.  12. The pulmonic valve was normal in structure. Pulmonic valve regurgitation is not visualized.  13. The inferior vena cava is normal in size with greater than 50% respiratory variability, suggesting right atrial pressure of 3 mmHg.  14. Normal LV systolic function; mild LVH; grade 1 diastolic dysfunction; s/p TAVR with mean gradient 10 mmHg; AVA 1.8 cm2) and no AI; mild LAE; mild MR.   EKG:  EKG is not ordered today.    Recent Labs: 09/24/2019: ALT 20 09/25/2019: Hemoglobin 12.8; Platelets 130 01/11/2020: BUN 25; Creatinine, Ser 1.28; Potassium 4.4; Sodium 142  Recent Lipid Panel    Component Value Date/Time   CHOL 104 09/19/2011 0135   TRIG 66 09/19/2011 0135   HDL 38 (L) 09/19/2011 0135   CHOLHDL 2.7 09/19/2011 0135   VLDL 13 09/19/2011 0135   LDLCALC 53 09/19/2011 0135    Physical Exam:    VS:  BP (!) 142/70   Pulse 90   Temp 97.9 F (36.6 C)   Ht 5\' 3"  (1.6 m)   Wt 155 lb 12.8 oz (70.7 kg)   SpO2 95%  BMI 27.60 kg/m     Wt Readings from Last 3 Encounters:  01/11/20 155 lb 12.8 oz (70.7 kg)  01/03/20 155 lb 12.8 oz (70.7 kg)  11/14/19 159 lb 9.6 oz (72.4 kg)     GEN:  Well  nourished, well developed in no acute distress HEENT: Normal NECK: No JVD; No carotid bruits LYMPHATICS: No lymphadenopathy CARDIAC: RRR, no murmurs, rubs, gallops RESPIRATORY:  Clear to auscultation without rales, wheezing or rhonchi  ABDOMEN: Soft, non-tender, non-distended MUSCULOSKELETAL:  2+ edema; No deformity  SKIN: Warm and dry NEUROLOGIC:  Alert and oriented x 3 PSYCHIATRIC:  Normal affect   ASSESSMENT:    1. Leg edema   2. Medication management   3. Coronary artery disease involving native coronary artery of native heart without angina pectoris   4. Essential hypertension   5. Hyperlipidemia LDL goal <70   6. AAA (abdominal aortic aneurysm) without rupture ([place])   7. Pacemaker   8. S/P TAVR (transcatheter aortic valve replacement)    PLAN:    In order of problems listed above:  1. Leg edema: Patient continues to have significant lower extremity edema.  I suspect there is a component of venous insufficiency.  During the meantime I will increase his Lasix to 20 mg daily.  I plan to obtain a basic metabolic panel today and also in 1 week.  During the meantime, he will continue to try leg elevation and may consider compression stocking as well.  I will bring him back in a few weeks for reassessment  2. CAD: Denies any recent chest discomfort  3. Hypertension: Blood pressure stable  4. Hyperlipidemia: On Lipitor  5. AAA: Last AAA Doppler obtained in October 2019 showed largest aorta measuring at 4.7 cm.  He would not be a good candidate for any invasive study given her advanced age.  6. History of pacemaker:: Followed by Dr. Lovena Le  7. History of TAVR: Stable on last echocardiogram.   Medication Adjustments/Labs and Tests Ordered: Current medicines are reviewed at length with the patient today.  Concerns regarding medicines are outlined above.  Orders Placed This Encounter  Procedures  . Basic metabolic panel  . Basic metabolic panel   Meds ordered this encounter   Medications  . furosemide (LASIX) 20 MG tablet    Sig: Take 1 tablet (20 mg total) by mouth daily.    Dispense:  90 tablet    Refill:  0    Patient Instructions  Medication Instructions:   Lasix is changed to 20 mg daily  *If you need a refill on your cardiac medications before your next appointment, please call your pharmacy*   Lab Work: Your physician recommends that you return for lab work TODAY and in 1 week:  BMET  If you have labs (blood work) drawn today and your tests are completely normal, you will receive your results only by: Marland Kitchen MyChart Message (if you have MyChart) OR . A paper copy in the mail If you have any lab test that is abnormal or we need to change your treatment, we will call you to review the results.   Testing/Procedures: NONE ordered at this time of appointment   Follow-Up: At [facility], you and your health needs are our priority.  As part of our continuing mission to provide you with exceptional heart care, we have created designated Provider Care Teams.  These Care Teams include your primary Cardiologist (physician) and Advanced Practice Providers (APPs -  Physician Assistants and Nurse Practitioners) who  all work together to provide you with the care you need, when you need it.  We recommend signing up for the patient portal called "MyChart".  Sign up information is provided on this After Visit Summary.  MyChart is used to connect with patients for Virtual Visits (Telemedicine).  Patients are able to view lab/test results, encounter notes, upcoming appointments, etc.  Non-urgent messages can be sent to your provider as well.   To learn more about what you can do with MyChart, go to [email or web address].    Your next appointment:   3 week(s) 3 months  The format for your next appointment:   In Person In Person  Provider:   Almyra Deforest, PA-C Peter Martinique, MD  Other Instructions      Signed, Almyra Deforest, [place]  01/13/2020 11:33 PM    [place]

## 2020-01-12 LAB — BASIC METABOLIC PANEL
BUN/Creatinine Ratio: 20 (ref 10–24)
BUN: 25 mg/dL (ref 10–36)
CO2: 27 mmol/L (ref 20–29)
Calcium: 9 mg/dL (ref 8.6–10.2)
Chloride: 102 mmol/L (ref 96–106)
Creatinine, Ser: 1.28 mg/dL — ABNORMAL HIGH (ref 0.76–1.27)
GFR calc Af Amer: 53 mL/min/{1.73_m2} — ABNORMAL LOW (ref >59)
GFR calc non Af Amer: 46 mL/min/{1.73_m2} — ABNORMAL LOW (ref >59)
Glucose: 95 mg/dL (ref 65–99)
Potassium: 4.4 mmol/L (ref 3.5–5.2)
Sodium: 142 mmol/L (ref 134–144)

## 2020-01-13 ENCOUNTER — Encounter: Payer: Self-pay | Admitting: Physician Assistant

## 2020-01-18 ENCOUNTER — Other Ambulatory Visit: Payer: Self-pay

## 2020-01-18 DIAGNOSIS — Z79899 Other long term (current) drug therapy: Secondary | ICD-10-CM

## 2020-01-18 LAB — BASIC METABOLIC PANEL
BUN/Creatinine Ratio: 19 (ref 10–24)
BUN: 23 mg/dL (ref 10–36)
CO2: 30 mmol/L — ABNORMAL HIGH (ref 20–29)
Calcium: 9 mg/dL (ref 8.6–10.2)
Chloride: 99 mmol/L (ref 96–106)
Creatinine, Ser: 1.19 mg/dL (ref 0.76–1.27)
GFR calc Af Amer: 58 mL/min/{1.73_m2} — ABNORMAL LOW (ref >59)
GFR calc non Af Amer: 50 mL/min/{1.73_m2} — ABNORMAL LOW (ref >59)
Glucose: 96 mg/dL (ref 65–99)
Potassium: 3.8 mmol/L (ref 3.5–5.2)
Sodium: 140 mmol/L (ref 134–144)

## 2020-01-18 NOTE — Progress Notes (Signed)
Reviewed, see comment from the most recent repeat lab

## 2020-02-08 ENCOUNTER — Ambulatory Visit: Payer: Medicare Other | Admitting: Physician Assistant

## 2020-02-09 ENCOUNTER — Encounter: Payer: Self-pay | Admitting: Physician Assistant

## 2020-02-09 ENCOUNTER — Ambulatory Visit: Payer: Medicare Other | Admitting: Physician Assistant

## 2020-02-09 ENCOUNTER — Other Ambulatory Visit: Payer: Self-pay

## 2020-02-09 VITALS — BP 123/73 | HR 77 | Temp 97.1°F | Ht 63.0 in | Wt 159.8 lb

## 2020-02-09 DIAGNOSIS — I10 Essential (primary) hypertension: Secondary | ICD-10-CM

## 2020-02-09 DIAGNOSIS — R600 Localized edema: Secondary | ICD-10-CM | POA: Diagnosis not present

## 2020-02-09 DIAGNOSIS — E785 Hyperlipidemia, unspecified: Secondary | ICD-10-CM

## 2020-02-09 DIAGNOSIS — Z953 Presence of xenogenic heart valve: Secondary | ICD-10-CM

## 2020-02-09 DIAGNOSIS — Z79899 Other long term (current) drug therapy: Secondary | ICD-10-CM | POA: Diagnosis not present

## 2020-02-09 DIAGNOSIS — I25118 Atherosclerotic heart disease of native coronary artery with other forms of angina pectoris: Secondary | ICD-10-CM

## 2020-02-09 DIAGNOSIS — Z950 Presence of cardiac pacemaker: Secondary | ICD-10-CM

## 2020-02-09 MED ORDER — TORSEMIDE 20 MG PO TABS: 20.0000 mg | ORAL_TABLET | Freq: Every day | ORAL | 3 refills | Status: DC

## 2020-02-09 NOTE — Progress Notes (Signed)
Cardiology Office Note:    Date:  02/11/2020   ID:  Jordan [name], DOB 29-Nov-1917, MRN [id number]  PCP:  Jordan Battles, MD  Cardiologist:  Jordan Martinique, MD  Electrophysiologist:  Jordan Peru, MD   Referring MD: Jordan Battles, MD   Chief Complaint  Patient presents with  . Follow-up    seen for Dr. Martinique    History of Present Illness:    Jordan [name] is a 86 y.o. male with a hx of CAD s/p remote stenting LAD 1999, HTN, HLD, AAA, h/o bradycardia s/p pacemaker and h/o severe AS s/p TAVR.Cardiac catheterization in January 2013 showed nonobstructive disease. He underwent TAVR on 05/15/2015. Cardiac catheterization performed prior to the procedure on 04/11/2015 showed 30% proximal RCA, 40% proximal LAD, 30% proximal left circumflex lesion, severe aortic valve stenosis. Echocardiogram in September 2017 looks good. He has a history of abdominal aortic aneurysm followed by vascular surgery. He was admitted in January 2021 with a fall and had postconcussion confusion. Family states that he likely had fallen when he tried to get up to urinate at night without the light on. He was also orthostatic as well. Troponin elevated at 2562. He was seen by Dr. Lovena [name], given no clear evidence of ACS by history, and normal echocardiogram, it was felt the patient likely had some vagal response. As mentioned above, echocardiogram obtained on 09/25/2019 showed EF 60 to 65%, grade 1 DD, mild MR. He was last seen by Dr. Martinique on 11/14/2019 at which time Dr. Martinique noted he had some ankle swelling. He felt the patient likely has dietary indiscretion and he was hesitant to add a diuretic given his advanced age.  I last saw the patient on 01/03/2019, he was having increasing lower extremity edema, more so on the left side.  Venous Doppler was negative for DVT.  Given his advanced age, I was hesitant to start him on scheduled diuretic, I only give him 20 mg Lasix for 2 days before change this to as  needed basis.    I saw the patient again on 4/28, given persistent lower extremity edema, increased his Lasix to 20 mg daily.  He presents today along with his son.  His weight has increased by about 4 pounds.  He has more left lower extremity edema compared to the right side.  Left lower extremity also appears to be erythematous concerning for cellulitis.  He does not have any shortness of breath or chest discomfort.  He also has a wound on top of his scalp that he was seen by dermatologist yesterday.  He was prescribed a course of Keflex for the scalp wound.  The same Keflex should also help with the cellulitis in the lower extremity.  I did discuss the case with DOD Jordan [name], he recommended switch the patient to low-dose torsemide 20 mg daily to see if he has better response as well as he does have appears to have a component of volume overload.  Again, we are trying to minimize to increase of diuretic given his advanced age.  We also discussed the importance of sodium restriction and leg elevation.  Past Medical History:  Diagnosis Date  . AAA (abdominal aortic aneurysm) ([place])   . Anemia   . Aortic stenosis   . Arthritis 09/18/11   "in my knees"  . Basal cell carcinoma of skin   . Blood transfusion   . BPH (benign prostatic hyperplasia)   . CAD (coronary artery disease)    Remote stent  to LAD in 1999. Last cath in 2004 and he is managed medically  . Colon polyps   . Complication of anesthesia   . Dysrhythmia    paced  . GERD (gastroesophageal reflux disease)   . Hemorrhoid   . HTN (hypertension)   . Hyperlipidemia   . Lung nodule    RLL  . 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 Renal mass, left   . 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 CARDIAC CATHETERIZATION  2004   Managed medically  . CARDIAC  CATHETERIZATION    . CARDIAC CATHETERIZATION N/A 04/11/2015   Procedure: Right/Left Heart Cath and Coronary Angiography;  Surgeon: Jordan Blanks, MD;  Location: [facility];  Service: Cardiovascular;  Laterality: N/A;  . CORONARY ANGIOGRAM  09/19/2011   Procedure: CORONARY ANGIOGRAM;  Surgeon: Jordan Hector, MD;  Location[facility] C[facility];  Service: Cardiovascular;;  . CORONARY ANGIOPLASTY WITH STENT PLACEMENT  05/09/1998   "1"; LAD  . INSERT / REPLACE / REMOVE PACEMAKER  2004   initial placement; [employer]  . INSERT / REPLACE / REMOVE PACEMAKER  02/14/2011  . NASAL HEMORRHAGE CONTROL  1980's   Clips placed to stop bleeding  . SKIN CANCER EXCISION  09/18/11   "have had a couple hundred of them removed since I was in my 40's"  . TEE WITHOUT CARDIOVERSION N/A 05/15/2015   Procedure: TRANSESOPHAGEAL ECHOCARDIOGRAM (TEE);  Surgeon: Jordan Blanks, MD;  Location: [place];  Service: Open Heart Surgery;  Laterality: N/A;  . TONSILLECTOMY     "when I was a teenager"  . TRANSCATHETER AORTIC VALVE REPLACEMENT, TRANSFEMORAL N/A 05/15/2015   Procedure: TRANSCATHETER AORTIC VALVE REPLACEMENT, TRANSFEMORAL;  Surgeon: Jordan Blanks, MD;  Location: [place];  Service: Open Heart Surgery;  Laterality: N/A;    Current Medications: Current Meds  Medication Sig  . acetaminophen (TYLENOL) 325 MG tablet Take 650 mg by mouth every 6 (six) hours as needed for mild pain, moderate pain or fever.   Marland Kitchen amLODipine (NORVASC) 5 MG tablet TAKE 1 TABLET BY MOUTH EVERY DAY (Patient taking differently: Take 5 mg by mouth daily. )  . atorvastatin (LIPITOR) 10 MG tablet Take 1 tablet (10 mg total) by mouth daily.  . cephALEXin (KEFLEX) 500 MG capsule Take 500 mg by mouth 3 (three) times daily.  . clopidogrel (PLAVIX) 75 MG tablet TAKE 1 TABLET BY MOUTH EVERY DAY (Patient taking differently: Take 75 mg by mouth daily. )  . NEXIUM 24HR 20 MG capsule TAKE 1 CAPSULE BY MOUTH DAILY AT 12 NOON  . silodosin  (RAPAFLO) 4 MG CAPS capsule Take 4 mg by mouth every evening.   . [DISCONTINUED] furosemide (LASIX) 20 MG tablet Take 1 tablet (20 mg total) by mouth daily.     Allergies:   Aspirin and Nitroglycerin   Social History   Socioeconomic History  . Marital status: Widowed    Spouse name: Not on file  . Number of children: 2  . Years of education: Not on file  . Highest education level: Not on file  Occupational History  . Occupation: Retired    Comment: Building control surveyor  Tobacco Use  . Smoking status: Former Smoker    Packs/day: 1.00    Years: 42.00    Pack years: 42.00    Types:  Cigarettes    Quit date: 08/18/1974    Years since quitting: 45.5  . Smokeless tobacco: Former Systems developer    Types: Chew  Substance and Sexual Activity  . Alcohol use: No    Alcohol/week: 0.0 standard drinks  . Drug use: No  . Sexual activity: Yes  Other Topics Concern  . Not on file  Social History Narrative  . Not on file   Social Determinants of Health   Financial Resource Strain:   . Difficulty of Paying Living Expenses:   Food Insecurity:   . Worried About Charity fundraiser in the Last Year:   . Arboriculturist in the Last Year:   Transportation Needs:   . Film/video editor (Medical):   Marland Kitchen Lack of Transportation (Non-Medical):   Physical Activity:   . Days of Exercise per Week:   . Minutes of Exercise per Session:   Stress:   . Feeling of Stress :   Social Connections:   . Frequency of Communication with Friends and Family:   . Frequency of Social Gatherings with Friends and Family:   . Attends Religious Services:   . Active Member of Clubs or Organizations:   . Attends Archivist Meetings:   Marland Kitchen Marital Status:      Family History: The patient's family history includes Cancer in his father; Colon cancer in his father; Diabetes in an other family member; Heart disease in his brother and brother.  ROS:   Please see the history of present illness.     All other systems reviewed  and are negative.  EKGs/Labs/Other Studies Reviewed:    The following studies were reviewed today:  Echo 09/25/2019 1. Left ventricular ejection fraction, by visual estimation, is 60 to  65%. The left ventricle has normal function. There is mildly increased  left ventricular hypertrophy.  2. Elevated left atrial pressure.  3. Left ventricular diastolic parameters are consistent with Grade I  diastolic dysfunction (impaired relaxation).  4. The left ventricle has no regional wall motion abnormalities.  5. Global right ventricle has normal systolic function.The right  ventricular size is normal.  6. Left atrial size was mildly dilated.  7. Right atrial size was normal.  8. Severe mitral annular calcification.  9. The mitral valve is normal in structure. Mild mitral valve  regurgitation. No evidence of mitral stenosis.  10. The tricuspid valve is normal in structure.  11. The aortic valve is normal in structure. Aortic valve regurgitation is  not visualized. No evidence of aortic valve sclerosis or stenosis.  12. The pulmonic valve was normal in structure. Pulmonic valve  regurgitation is not visualized.  13. The inferior vena cava is normal in size with greater than 50%  respiratory variability, suggesting right atrial pressure of 3 mmHg.  14. Normal LV systolic function; mild LVH; grade 1 diastolic dysfunction;  s/p TAVR with mean gradient 10 mmHg; AVA 1.8 cm2) and no AI; mild LAE;  mild MR.   EKG:  EKG is not ordered today.    Recent Labs: 09/24/2019: ALT 20 09/25/2019: Hemoglobin 12.8; Platelets 130 01/18/2020: BUN 23; Creatinine, Ser 1.19; Potassium 3.8; Sodium 140  Recent Lipid Panel    Component Value Date/Time   CHOL 104 09/19/2011 0135   TRIG 66 09/19/2011 0135   HDL 38 (L) 09/19/2011 0135   CHOLHDL 2.7 09/19/2011 0135   VLDL 13 09/19/2011 0135   LDLCALC 53 09/19/2011 0135    Physical Exam:    VS:  BP  123/73   Pulse 77   Temp (!) 97.1 F (36.2 C)   Ht 5'  3" (1.6 m)   Wt 159 lb 12.8 oz (72.5 kg)   SpO2 95%   BMI 28.31 kg/m     Wt Readings from Last 3 Encounters:  02/09/20 159 lb 12.8 oz (72.5 kg)  01/11/20 155 lb 12.8 oz (70.7 kg)  01/03/20 155 lb 12.8 oz (70.7 kg)     GEN:  Well nourished, well developed in no acute distress HEENT: Normal NECK: No JVD; No carotid bruits LYMPHATICS: No lymphadenopathy CARDIAC: RRR, no murmurs, rubs, gallops RESPIRATORY:  Clear to auscultation without rales, wheezing or rhonchi  ABDOMEN: Soft, non-tender, non-distended MUSCULOSKELETAL:  No edema; No deformity  SKIN: Warm and dry NEUROLOGIC:  Alert and oriented x 3 PSYCHIATRIC:  Normal affect   ASSESSMENT:    1. Leg edema   2. Medication management   3. Coronary artery disease involving native coronary artery of native heart with other form of angina pectoris ([place])   4. Essential hypertension   5. Hyperlipidemia LDL goal <70   6. Pacemaker   7. S/p TAVR (transcatheter aortic valve replacement), bioprosthetic    PLAN:    In order of problems listed above:  1. Leg edema: Despite increasing his Lasix to 20 mg daily, however leg edema continues.  He has at least 3+ pitting edema in the left lower extremity and 2+ pitting edema in the right lower extremity.  Left lower extremity erythematous concerning for cellulitis.  He was seen by his dermatologist for scalp wound yesterday and was started on Keflex which should also cover cellulitis as well.  I recommended finish the current course of antibiotic.  I also discussed the case with DOD Jordan [name] given persistent leg edema, Jordan [name] recommended switching the Lasix to torsemide 20 mg daily.  Patient will need basic metabolic panel in 1 week to assess renal function and electrolytes  2. CAD: Denies any recent chest discomfort  3. Hypertension: Blood blood pressure stable  4. Hyperlipidemia: On Lipitor  5. AAA: Last AAA Doppler obtained in 2019 showed largest aorta measuring at 4.7 cm.  He is  not a candidate for any invasive study given his advanced age.  6. History of pacemaker: Followed by Dr. Lovena [name]  7. History of TAVR: Stable on last echocardiogram.   Medication Adjustments/Labs and Tests Ordered: Current medicines are reviewed at length with the patient today.  Concerns regarding medicines are outlined above.  Orders Placed This Encounter  Procedures  . Basic metabolic panel   Meds ordered this encounter  Medications  . torsemide (DEMADEX) 20 MG tablet    Sig: Take 1 tablet (20 mg total) by mouth daily.    Dispense:  90 tablet    Refill:  3    Patient Instructions  Medication Instructions:   STOP Lasix  START Torsemide 20 mg daily  *If you need a refill on your cardiac medications before your next appointment, please call your pharmacy*  Lab Work: Your physician recommends that you return for lab work in 2 WEEKS:   BMET   If you have labs (blood work) drawn today and your tests are completely normal, you will receive your results only by: Marland Kitchen MyChart Message (if you have MyChart) OR . A paper copy in the mail If you have any lab test that is abnormal or we need to change your treatment, we will call you to review the results.  Testing/Procedures: NONE ordered at  this time of appointment   Follow-Up: At [facility], you and your health needs are our priority.  As part of our continuing mission to provide you with exceptional heart care, we have created designated Provider Care Teams.  These Care Teams include your primary Cardiologist (physician) and Advanced Practice Providers (APPs -  Physician Assistants and Nurse Practitioners) who all work together to provide you with the care you need, when you need it.  Your next appointment:   Keep follow up as scheduled -04/11/2020 at 11AM   The format for your next appointment:   In Person  Provider:   Peter Martinique, MD  Other Instructions      Signed, Almyra Deforest, [place]  02/11/2020 [place]

## 2020-02-09 NOTE — Patient Instructions (Addendum)
Medication Instructions:   STOP Lasix  START Torsemide 20 mg daily  *If you need a refill on your cardiac medications before your next appointment, please call your pharmacy*  Lab Work: Your physician recommends that you return for lab work in 2 WEEKS:   BMET   If you have labs (blood work) drawn today and your tests are completely normal, you will receive your results only by: Marland Kitchen MyChart Message (if you have MyChart) OR . A paper copy in the mail If you have any lab test that is abnormal or we need to change your treatment, we will call you to review the results.  Testing/Procedures: NONE ordered at this time of appointment   Follow-Up: At [facility], you and your health needs are our priority.  As part of our continuing mission to provide you with exceptional heart care, we have created designated Provider Care Teams.  These Care Teams include your primary Cardiologist (physician) and Advanced Practice Providers (APPs -  Physician Assistants and Nurse Practitioners) who all work together to provide you with the care you need, when you need it.  Your next appointment:   Keep follow up as scheduled -04/11/2020 at 11AM   The format for your next appointment:   In Person  Provider:   Peter Martinique, MD  Other Instructions

## 2020-02-11 ENCOUNTER — Encounter: Payer: Self-pay | Admitting: Physician Assistant

## 2020-02-20 ENCOUNTER — Ambulatory Visit (INDEPENDENT_AMBULATORY_CARE_PROVIDER_SITE_OTHER): Payer: Medicare Other | Admitting: Ophthalmology

## 2020-02-20 ENCOUNTER — Encounter (INDEPENDENT_AMBULATORY_CARE_PROVIDER_SITE_OTHER): Payer: Self-pay | Admitting: Ophthalmology

## 2020-02-20 ENCOUNTER — Other Ambulatory Visit: Payer: Self-pay

## 2020-02-20 DIAGNOSIS — H35351 Cystoid macular degeneration, right eye: Secondary | ICD-10-CM | POA: Diagnosis not present

## 2020-02-20 DIAGNOSIS — H353131 Nonexudative age-related macular degeneration, bilateral, early dry stage: Secondary | ICD-10-CM | POA: Diagnosis not present

## 2020-02-20 DIAGNOSIS — H35371 Puckering of macula, right eye: Secondary | ICD-10-CM

## 2020-02-20 MED ORDER — KETOROLAC TROMETHAMINE 0.5 % OP SOLN
1.0000 [drp] | Freq: Two times a day (BID) | OPHTHALMIC | 1 refills | Status: DC
Start: 2020-02-20 — End: 2020-04-19

## 2020-02-20 NOTE — Progress Notes (Signed)
02/20/2020     CHIEF COMPLAINT Patient presents for Retina Follow Up   HISTORY OF PRESENT ILLNESS: Jordan [name] is a 86 y.o. male who presents to the clinic today for:   HPI    Retina Follow Up    Patient presents with  Other.  In right eye.  Duration of 3 months.  Since onset it is stable.          Comments    3 month follow up - OCT OU Patient denies change in vision and overall has no complaints.        Last edited by Gerda Diss on 02/20/2020 10:46 AM. (History)      Referring physician: Leanna Battles, MD [place],  [place] [address]  HISTORICAL INFORMATION:   Selected notes from the MEDICAL RECORD NUMBER    Lab Results  Component Value Date   HGBA1C 5.5 05/11/2015     CURRENT MEDICATIONS: No current outpatient medications on file. (Ophthalmic Drugs)   No current facility-administered medications for this visit. (Ophthalmic Drugs)   Current Outpatient Medications (Other)  Medication Sig  . acetaminophen (TYLENOL) 325 MG tablet Take 650 mg by mouth every 6 (six) hours as needed for mild pain, moderate pain or fever.   Marland Kitchen amLODipine (NORVASC) 5 MG tablet TAKE 1 TABLET BY MOUTH EVERY DAY (Patient taking differently: Take 5 mg by mouth daily. )  . atorvastatin (LIPITOR) 10 MG tablet Take 1 tablet (10 mg total) by mouth daily.  . cephALEXin (KEFLEX) 500 MG capsule Take 500 mg by mouth 3 (three) times daily.  . clopidogrel (PLAVIX) 75 MG tablet TAKE 1 TABLET BY MOUTH EVERY DAY (Patient taking differently: Take 75 mg by mouth daily. )  . NEXIUM 24HR 20 MG capsule TAKE 1 CAPSULE BY MOUTH DAILY AT 12 NOON  . silodosin (RAPAFLO) 4 MG CAPS capsule Take 4 mg by mouth every evening.   . torsemide (DEMADEX) 20 MG tablet Take 1 tablet (20 mg total) by mouth daily.   No current facility-administered medications for this visit. (Other)      REVIEW OF SYSTEMS:    ALLERGIES Allergies  Allergen Reactions  . Aspirin Other (See Comments)   Stomach bleeds  . Nitroglycerin     Makes patient faint    PAST MEDICAL HISTORY Past Medical History:  Diagnosis Date  . AAA (abdominal aortic aneurysm) ([place])   . Anemia   . Aortic stenosis   . Arthritis 09/18/11   "in my knees"  . Basal cell carcinoma of skin   . Blood transfusion   . BPH (benign prostatic hyperplasia)   . CAD (coronary artery disease)    Remote stent to LAD in 1999. Last cath in 2004 and he is managed medically  . Colon polyps   . Complication of anesthesia   . Dysrhythmia    paced  . GERD (gastroesophageal reflux disease)   . Hemorrhoid   . HTN (hypertension)   . Hyperlipidemia   . Lung nodule    RLL  . 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 Renal mass, left   . 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 CARDIAC CATHETERIZATION  2004   Managed medically  . CARDIAC CATHETERIZATION    . CARDIAC CATHETERIZATION N/A 04/11/2015   Procedure: Right/Left Heart Cath  and Coronary Angiography;  Surgeon: Burnell Blanks, MD;  Location: [facility] CV LAB;  Service: Cardiovascular;  Laterality: N/A;  . CORONARY ANGIOGRAM  09/19/2011   Procedure: CORONARY ANGIOGRAM;  Surgeon: Josue Hector, MD;  Location: [facility] CATH LAB;  Service: Cardiovascular;;  . CORONARY ANGIOPLASTY WITH STENT PLACEMENT  05/09/1998   "1"; LAD  . INSERT / REPLACE / REMOVE PACEMAKER  2004   initial placement; [employer]  . INSERT / REPLACE / REMOVE PACEMAKER  02/14/2011  . NASAL HEMORRHAGE CONTROL  1980's   Clips placed to stop bleeding  . SKIN CANCER EXCISION  09/18/11   "have had a couple hundred of them removed since I was in my 40's"  . TEE WITHOUT CARDIOVERSION N/A 05/15/2015   Procedure: TRANSESOPHAGEAL ECHOCARDIOGRAM (TEE);  Surgeon: Burnell Blanks, MD;  Location: [facility];  Service: Open Heart Surgery;   Laterality: N/A;  . TONSILLECTOMY     "when I was a teenager"  . TRANSCATHETER AORTIC VALVE REPLACEMENT, TRANSFEMORAL N/A 05/15/2015   Procedure: TRANSCATHETER AORTIC VALVE REPLACEMENT, TRANSFEMORAL;  Surgeon: Burnell Blanks, MD;  Location: [facility];  Service: Open Heart Surgery;  Laterality: N/A;    FAMILY HISTORY Family History  Problem Relation Age of Onset  . Cancer Father        stomach  . Colon cancer Father   . Heart disease Brother   . Heart disease Brother   . Diabetes Other        granddaughter    SOCIAL HISTORY Social History   Tobacco Use  . Smoking status: Former Smoker    Packs/day: 1.00    Years: 42.00    Pack years: 42.00    Types: Cigarettes    Quit date: 08/18/1974    Years since quitting: 45.5  . Smokeless tobacco: Former Systems developer    Types: Chew  Substance Use Topics  . Alcohol use: No    Alcohol/week: 0.0 standard drinks  . Drug use: No         OPHTHALMIC EXAM:  Base Eye Exam    Visual Acuity (Snellen - Linear)      Right Left   Dist [place] I 20/30-2 20/60-2   Dist ph [place] NI 20/30-2       Tonometry (Tonopen, 10:53 AM)      Right Left   Pressure 17 12       Pupils      Pupils Dark Light Shape React APD   Right PERRL 4 4 Irregular Sluggish None   Left PERRL 4 4 Irregular Sluggish None       Visual Fields (Counting fingers)      Left Right    Full Full       Extraocular Movement      Right Left    Full Full       Neuro/Psych    Oriented x3: Yes   Mood/Affect: Normal       Dilation    Both eyes: 1.0% Mydriacyl, 2.5% Phenylephrine @ 10:53 AM        Slit Lamp and Fundus Exam    External Exam      Right Left   External Normal Normal       Slit Lamp Exam      Right Left   Lids/Lashes Normal Normal   Conjunctiva/Sclera White and quiet White and quiet   Cornea Clear Clear   Anterior Chamber Deep and quiet Deep and quiet   Iris Round and reactive Round and reactive  Lens Anterior chamber intraocular lens Posterior chamber  intraocular lens   Anterior Vitreous Normal Normal       Fundus Exam      Right Left   Posterior Vitreous  Normal   Disc Normal Normal   C/D Ratio 0.7 0.7   Macula Hard drusen Hard drusen   Vessels Normal Normal   Periphery Normal Normal          IMAGING AND PROCEDURES  Imaging and Procedures for 02/20/20  OCT, Retina - OU - Both Eyes       Right Eye Quality was good. Scan locations included subfoveal. Central Foveal Thickness: 374. Findings include abnormal foveal contour, epiretinal membrane, cystoid macular edema.   Left Eye Quality was good. Scan locations included subfoveal. Central Foveal Thickness: 276. Findings include normal observations.   Notes OD with CME inferior to the foveal avascular zone.  This area we will treat with topical nonsteroidals twice daily                ASSESSMENT/PLAN:  Cystoid macular edema of right eye New, and minor inferior to the foveal avascular zone, not in the center of the fovea at this time.  Will treat with topical nonsteroidals initially.  Use ketorolac twice daily OD      ICD-10-CM   1. Cystoid macular edema of right eye  H35.351 OCT, Retina - OU - Both Eyes  2. Early stage nonexudative age-related macular degeneration of both eyes  H35.3131 OCT, Retina - OU - Both Eyes  3. Macular pucker, right eye  H35.371     1.  2.  3.  Ophthalmic Meds Ordered this visit:  No orders of the defined types were placed in this encounter.      Return in about 8 weeks (around 04/16/2020) for dilate, OD, OCT.  There are no Patient Instructions on file for this visit.   Explained the diagnoses, plan, and follow up with the patient and they expressed understanding.  Patient expressed understanding of the importance of proper follow up care.   Clent Demark Brissia Delisa M.D. Diseases & Surgery of the Retina and Vitreous [facility] 02/20/20     Abbreviations: M myopia (nearsighted); A astigmatism; H hyperopia  (farsighted); P presbyopia; Mrx spectacle prescription;  CTL contact lenses; OD right eye; OS left eye; OU both eyes  XT exotropia; ET esotropia; PEK punctate epithelial keratitis; PEE punctate epithelial erosions; DES dry eye syndrome; MGD meibomian gland dysfunction; ATs artificial tears; PFAT's preservative free artificial tears; [place] nuclear sclerotic cataract; PSC posterior subcapsular cataract; ERM epi-retinal membrane; PVD posterior vitreous detachment; RD retinal detachment; DM diabetes mellitus; DR diabetic retinopathy; NPDR non-proliferative diabetic retinopathy; PDR proliferative diabetic retinopathy; CSME clinically significant macular edema; DME diabetic macular edema; dbh dot blot hemorrhages; CWS cotton wool spot; POAG primary open angle glaucoma; C/D cup-to-disc ratio; HVF humphrey visual field; GVF goldmann visual field; OCT optical coherence tomography; IOP intraocular pressure; BRVO Branch retinal vein occlusion; CRVO central retinal vein occlusion; CRAO central retinal artery occlusion; BRAO branch retinal artery occlusion; RT retinal tear; SB scleral buckle; PPV pars plana vitrectomy; VH Vitreous hemorrhage; PRP panretinal laser photocoagulation; IVK intravitreal kenalog; VMT vitreomacular traction; MH Macular hole;  NVD neovascularization of the disc; NVE neovascularization elsewhere; AREDS age related eye disease study; ARMD age related macular degeneration; POAG primary open angle glaucoma; EBMD epithelial/anterior basement membrane dystrophy; ACIOL anterior chamber intraocular lens; IOL intraocular lens; PCIOL posterior chamber intraocular lens; Phaco/IOL phacoemulsification with intraocular lens placement; [place]  photorefractive keratectomy; LASIK laser assisted in situ keratomileusis; HTN hypertension; DM diabetes mellitus; COPD chronic obstructive pulmonary disease

## 2020-02-20 NOTE — Assessment & Plan Note (Signed)
New, and minor inferior to the foveal avascular zone, not in the center of the fovea at this time.  Will treat with topical nonsteroidals initially.  Use ketorolac twice daily OD

## 2020-02-23 ENCOUNTER — Other Ambulatory Visit: Payer: Self-pay

## 2020-02-23 DIAGNOSIS — Z79899 Other long term (current) drug therapy: Secondary | ICD-10-CM

## 2020-02-24 LAB — BASIC METABOLIC PANEL
BUN/Creatinine Ratio: 21 (ref 10–24)
BUN: 30 mg/dL (ref 10–36)
CO2: 30 mmol/L — ABNORMAL HIGH (ref 20–29)
Calcium: 8.9 mg/dL (ref 8.6–10.2)
Chloride: 99 mmol/L (ref 96–106)
Creatinine, Ser: 1.41 mg/dL — ABNORMAL HIGH (ref 0.76–1.27)
GFR calc Af Amer: 47 mL/min/{1.73_m2} — ABNORMAL LOW (ref >59)
GFR calc non Af Amer: 41 mL/min/{1.73_m2} — ABNORMAL LOW (ref >59)
Glucose: 98 mg/dL (ref 65–99)
Potassium: 4.3 mmol/L (ref 3.5–5.2)
Sodium: 142 mmol/L (ref 134–144)

## 2020-02-24 NOTE — Progress Notes (Signed)
Renal function slightly down, suggest volume depletion, decrease torsemide to 10mg  daily instead.

## 2020-02-27 ENCOUNTER — Telehealth: Payer: Self-pay | Admitting: Cardiology

## 2020-02-27 NOTE — Telephone Encounter (Signed)
Pt's daughter in law advised of lab results and PA's recommendation. Daughter in law voiced that pt's swelling has gotten better but still very noticeable. She feels the toresmide is not working and now they are having to decrease dose. She denies SOB but would like Dr. Doug Sou input and want to know if he should be seen sooner than 7/22.  Will route to MD

## 2020-02-27 NOTE — Addendum Note (Signed)
Addended by: Kathyrn Lass on: 02/27/2020 02:40 PM   Modules accepted: Orders

## 2020-02-27 NOTE — Telephone Encounter (Signed)
Called patient's daughter n law Manus Gunning left Dr.Jordan's advice on personal voice mail.

## 2020-02-27 NOTE — Telephone Encounter (Signed)
Jordan [name] the patient's daughter in law is calling requesting a nurse call her to discuss the patient's most recent lab results due to not remembering what the nurse said. Jordan [name] would also like to know why his medication was decreased due to it. Please advise.

## 2020-02-27 NOTE — Telephone Encounter (Signed)
Spoke to patient's daughter n law Manus Gunning.She stated patient is taking Torsemide 20 mg 1/2 tablet daily.Stated Torsemide was decreased last Friday 6/11 to 10 mg daily.Advised to take as recommended by Almyra Deforest PA.Stated she would like patient to see Dr.Jordan before 7/28.Appointment moved up to 7/12 at 4:30 pm.Advised to call sooner if needed.

## 2020-02-27 NOTE — Telephone Encounter (Signed)
Jordan [name] decreased his torsemide because his creatinine was a little higher. I would recommend staying on the 20 mg daily. Can wait and see in July  Jordan [name] Martinique MD, [facility]

## 2020-02-27 NOTE — Telephone Encounter (Signed)
Patient's daughter in law returned your call. She has a few more questions about you.

## 2020-03-06 ENCOUNTER — Ambulatory Visit (INDEPENDENT_AMBULATORY_CARE_PROVIDER_SITE_OTHER): Payer: Medicare Other | Admitting: *Deleted

## 2020-03-06 DIAGNOSIS — I441 Atrioventricular block, second degree: Secondary | ICD-10-CM | POA: Diagnosis not present

## 2020-03-06 LAB — CUP PACEART REMOTE DEVICE CHECK
Battery Impedance: 3125 Ohm
Battery Remaining Longevity: 17 mo
Battery Voltage: 2.7 V
Brady Statistic AP VP Percent: 12 %
Brady Statistic AP VS Percent: 0 %
Brady Statistic AS VP Percent: 87 %
Brady Statistic AS VS Percent: 0 %
Date Time Interrogation Session: 20210622090510
Implantable Lead Implant Date: 20040811
Implantable Lead Implant Date: 20040811
Implantable Lead Location: 753859
Implantable Lead Location: 753860
Implantable Lead Model: 4469
Implantable Lead Model: 4470
Implantable Lead Serial Number: 421267
Implantable Lead Serial Number: 436022
Implantable Pulse Generator Implant Date: 20120601
Lead Channel Impedance Value: 466 Ohm
Lead Channel Impedance Value: 476 Ohm
Lead Channel Pacing Threshold Amplitude: 0.75 V
Lead Channel Pacing Threshold Amplitude: 1.125 V
Lead Channel Pacing Threshold Pulse Width: 0.4 ms
Lead Channel Pacing Threshold Pulse Width: 0.4 ms
Lead Channel Setting Pacing Amplitude: 2 V
Lead Channel Setting Pacing Amplitude: 2.5 V
Lead Channel Setting Pacing Pulse Width: 0.4 ms
Lead Channel Setting Sensing Sensitivity: 1 mV

## 2020-03-07 NOTE — Progress Notes (Signed)
Remote pacemaker transmission.   

## 2020-03-16 ENCOUNTER — Telehealth: Payer: Self-pay | Admitting: Cardiology

## 2020-03-16 NOTE — Telephone Encounter (Signed)
Jordan [name] the pt's daughter in aw is calling requesting Dr. Doug Sou nurse call her to discuss [name]'s legs. Please advise.

## 2020-03-16 NOTE — Telephone Encounter (Signed)
Spoke to pt's daughter in law who reports pt continues to experience swelling in his feet and legs. She also report he sometimes have pain that radiates from his leg hip down his legs. Nurse offered to schedule an appointment with APP but daughter in law declined and mention pt only want to see Dr. Martinique. Daughter in law is requesting to send a message to MD's nurse to see if pt can be worked into MD's schedule or to call her if there is a cancellation.   Will forward to nurse.

## 2020-03-20 NOTE — Telephone Encounter (Signed)
Returned call to patient's daughter n law Manus Gunning.Left message on personal voice mail Dr.Jordan is out of office this week.Advised to call back to schedule patient appointment with PA this week or he can keep appointment with Dr.Jordan 7/12 at 4:40 pm.

## 2020-03-23 NOTE — Progress Notes (Signed)
Jordan [name] Date of Birth: Oct 17, 1917   History of Present Illness: Jordan [name] is seen today as a work in for evaluation of edema.   He has a history of coronary disease with remote stenting of the LAD in 1999. Cardiac catheterization January 2013 showed nonobstructive disease. He has a history of bradycardia requiring pacemaker implant. He has a history of severe aortic stenosis. He underwent TAVR on 05/15/15.  Follow up Echo in September 2017 looked good. Pacemaker follow up in February 2018 was satisfactory.  He has a 4.6 cm abdominal aortic aneurysm followed  by [facility]- last checked in October 2019. Stable at 4.7 cm.  Not a candidate for stent grafting. He was switched from Nexium to Protonix due to possible interaction with Plavix but protonix made him feel bad so he went back to nexium.   He was admitted in January with  a fall. Had post concussion confusion. Son thinks he may have fallen when he got up to urinate at night without the light on. Was orthostatic. Troponin elevated 2562> 2467. Seen by Dr Lovena Le. No clear evidence of ACS by history. Echo was OK and pacemaker checked out. CT of head without acute change. Felt he had some vagal response related to abdominal pain.  He was seen this spring with increased LE edema. Doppler was negative for DVT.  Was placed on lasix 20 mg daily and later switched to torsemide 10 mg daily. He is seen back today. Notes no improvement in swelling L>R. Some blistering. No increased warmth or redness. Eats out mostly so reducing salt is a challenge. Hasn't tried support hose. Some intermittent sciatica pain on left.    Current Outpatient Medications on File Prior to Visit  Medication Sig Dispense Refill  . acetaminophen (TYLENOL) 325 MG tablet Take 650 mg by mouth every 6 (six) hours as needed for mild pain, moderate pain or fever.     Marland Kitchen amLODipine (NORVASC) 5 MG tablet TAKE 1 TABLET BY MOUTH EVERY DAY (Patient taking differently: Take 5 mg by mouth  daily. ) 90 tablet 2  . atorvastatin (LIPITOR) 10 MG tablet Take 1 tablet (10 mg total) by mouth daily. 90 tablet 3  . cephALEXin (KEFLEX) 500 MG capsule Take 500 mg by mouth 3 (three) times daily.    . clopidogrel (PLAVIX) 75 MG tablet TAKE 1 TABLET BY MOUTH EVERY DAY (Patient taking differently: Take 75 mg by mouth daily. ) 90 tablet 2  . ketorolac (ACULAR) 0.5 % ophthalmic solution Place 1 drop into the right eye 2 times daily at 12 noon and 4 pm. 9 mL 1  . NEXIUM 24HR 20 MG capsule TAKE 1 CAPSULE BY MOUTH DAILY AT 12 NOON 84 capsule 4  . silodosin (RAPAFLO) 4 MG CAPS capsule Take 4 mg by mouth every evening.     . torsemide (DEMADEX) 20 MG tablet Take 20 mg by mouth daily. 180 tablet 3   No current facility-administered medications on file prior to visit.    Allergies  Allergen Reactions  . Aspirin Other (See Comments)    Stomach bleeds  . Nitroglycerin     Makes patient faint    Past Medical History:  Diagnosis Date  . AAA (abdominal aortic aneurysm) ([place])   . Anemia   . Aortic stenosis   . Arthritis 09/18/11   "in my knees"  . Basal cell carcinoma of skin   . Blood transfusion   . BPH (benign prostatic hyperplasia)   . CAD (coronary artery  disease)    Remote stent to LAD in 1999. Last cath in 2004 and he is managed medically  . Colon polyps   . Complication of anesthesia   . Dysrhythmia    paced  . GERD (gastroesophageal reflux disease)   . Hemorrhoid   . HTN (hypertension)   . Hyperlipidemia   . Lung nodule    RLL  . 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 Renal mass, left   . 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 CARDIAC CATHETERIZATION  2004   Managed medically  . CARDIAC CATHETERIZATION    . CARDIAC CATHETERIZATION N/A 04/11/2015   Procedure: Right/Left Heart Cath and  Coronary Angiography;  Surgeon: Burnell Blanks, MD;  Location: [facility] CV LAB;  Service: Cardiovascular;  Laterality: N/A;  . CORONARY ANGIOGRAM  09/19/2011   Procedure: CORONARY ANGIOGRAM;  Surgeon: Josue Hector, MD;  Location: [facility] CATH LAB;  Service: Cardiovascular;;  . CORONARY ANGIOPLASTY WITH STENT PLACEMENT  05/09/1998   "1"; LAD  . INSERT / REPLACE / REMOVE PACEMAKER  2004   initial placement; [employer]  . INSERT / REPLACE / REMOVE PACEMAKER  02/14/2011  . NASAL HEMORRHAGE CONTROL  1980's   Clips placed to stop bleeding  . SKIN CANCER EXCISION  09/18/11   "have had a couple hundred of them removed since I was in my 40's"  . TEE WITHOUT CARDIOVERSION N/A 05/15/2015   Procedure: TRANSESOPHAGEAL ECHOCARDIOGRAM (TEE);  Surgeon: Burnell Blanks, MD;  Location: [facility];  Service: Open Heart Surgery;  Laterality: N/A;  . TONSILLECTOMY     "when I was a teenager"  . TRANSCATHETER AORTIC VALVE REPLACEMENT, TRANSFEMORAL N/A 05/15/2015   Procedure: TRANSCATHETER AORTIC VALVE REPLACEMENT, TRANSFEMORAL;  Surgeon: Burnell Blanks, MD;  Location: [facility];  Service: Open Heart Surgery;  Laterality: N/A;    Social History   Tobacco Use  Smoking Status Former Smoker  . Packs/day: 1.00  . Years: 42.00  . Pack years: 42.00  . Types: Cigarettes  . Quit date: 08/18/1974  . Years since quitting: 45.6  Smokeless Tobacco Former Systems developer  . Types: Chew    Social History   Substance and Sexual Activity  Alcohol Use No  . Alcohol/week: 0.0 standard drinks    Family History  Problem Relation Age of Onset  . Cancer Father        stomach  . Colon cancer Father   . Heart disease Brother   . Heart disease Brother   . Diabetes Other        granddaughter    Review of Systems: As noted in history of present illness. All other systems were reviewed and are negative.  Physical Exam: BP (!) 146/82   Pulse 87   Temp (!) 97.2 F (36.2 C)   Ht 5\' 3"  (1.6 m)   Wt 160 lb 12.8 oz  (72.9 kg)   SpO2 94%   BMI 28.48 kg/m  GENERAL:  Well appearing elderly WM in NAD HEENT:  PERRL, EOMI, sclera are clear. Oropharynx is clear. NECK:  No jugular venous distention, carotid upstroke brisk and symmetric, no bruits, no thyromegaly or adenopathy LUNGS:  Basilar crackles R>L CHEST:  Unremarkable HEART:  RRR,  PMI not displaced or sustained,S1 and S2 within normal limits, no S3, no S4: no clicks,  no rubs, gr 2/6 systolic murmur. ABD:  Soft, nontender. BS +, no masses or bruits. No hepatomegaly, no splenomegaly EXT:  2 + pulses throughout, 2+ LE edema L>R, stasis dermatitis. No warmth or drainage. SKIN:  Warm and dry.  No rashes NEURO:  Alert and oriented x 3. Cranial nerves II through XII intact. PSYCH:  Cognitively intact    LABORATORY DATA: Lab Results  Component Value Date   WBC 9.6 09/25/2019   HGB 12.8 (L) 09/25/2019   HCT 40.5 09/25/2019   PLT 130 (L) 09/25/2019   GLUCOSE 98 02/23/2020   CHOL 104 09/19/2011   TRIG 66 09/19/2011   HDL 38 (L) 09/19/2011   LDLCALC 53 09/19/2011   ALT 20 09/24/2019   AST 41 09/24/2019   NA 142 02/23/2020   K 4.3 02/23/2020   CL 99 02/23/2020   CREATININE 1.41 (H) 02/23/2020   BUN 30 02/23/2020   CO2 30 (H) 02/23/2020   TSH 0.797 09/18/2011   INR 1.1 09/24/2019   HGBA1C 5.5 05/11/2015   Labs dated 07/05/18: Normal CBC and CMET. Cholesterol 120, triglycerides 131, HDL 36, LDL 58.   Echo: 05/28/16:  Study Conclusions  - Left ventricle: The cavity size was mildly dilated. Wall   thickness was increased in a pattern of moderate LVH. Systolic   function was normal. The estimated ejection fraction was in the   range of 60% to 65%. Doppler parameters are consistent with   abnormal left ventricular relaxation (grade 1 diastolic   dysfunction). - Aortic valve: 26 mm Sapien 3 valve in excellent position with no   perivalvular regurgitation and stable gradients. - Mitral valve: Calcified annulus. Moderately thickened leaflets  .   There was mild regurgitation. Valve area by pressure half-time:   2.5 cm^2. - Left atrium: The atrium was moderately dilated. - Atrial septum: No defect or patent foramen ovale was identified.  Echo 09/25/19:IMPRESSIONS    1. Left ventricular ejection fraction, by visual estimation, is 60 to  65%. The left ventricle has normal function. There is mildly increased  left ventricular hypertrophy.  2. Elevated left atrial pressure.  3. Left ventricular diastolic parameters are consistent with Grade I  diastolic dysfunction (impaired relaxation).  4. The left ventricle has no regional wall motion abnormalities.  5. Global right ventricle has normal systolic function.The right  ventricular size is normal.  6. Left atrial size was mildly dilated.  7. Right atrial size was normal.  8. Severe mitral annular calcification.  9. The mitral valve is normal in structure. Mild mitral valve  regurgitation. No evidence of mitral stenosis.  10. The tricuspid valve is normal in structure.  11. The aortic valve is normal in structure. Aortic valve regurgitation is  not visualized. No evidence of aortic valve sclerosis or stenosis.  12. The pulmonic valve was normal in structure. Pulmonic valve  regurgitation is not visualized.  13. The inferior vena cava is normal in size with greater than 50%  respiratory variability, suggesting right atrial pressure of 3 mmHg.  14. Normal LV systolic function; mild LVH; grade 1 diastolic dysfunction;  s/p TAVR with mean gradient 10 mmHg; AVA 1.8 cm2) and no AI; mild LAE;  mild MR.   Assessment / Plan: 1. Coronary disease with remote stenting of the LAD in 1999. Cardiac catheterization Jan 2013 showed nonobstructive disease. He has no significant angina. We will continue with Plavix and statin therapy.  2. Severe aortic stenosis.s/p TAVR in August 2016. Excellent result. Follow up Echo in  January 2021 looked good. Exam is stable.    3. Sinus node  dysfunction with bradycardia. Status post pacemaker implant. Pacemaker followup in Jan. was satisfactory. Follow up in pacer clinic.  4. Hyperlipidemia. On chronic Crestor therapy. Excellent control. LDL 58.   5. AAA 4.7 cm. Followed by [facility]. Not a candidate for stent grafting. He is a poor candidate for open repair. Last [place] October 2019 was stable.   6. Renal mass. Followed by urology. Patient reports this is stable/smaller.   7. Edema. Due to venous insufficiency. Recommend sodium restriction. Needs moderate support hose to wear daily. Increase torsemide to 20 mg daily. Elevate feet when possible.   I will follow up in 3 months.

## 2020-03-26 ENCOUNTER — Ambulatory Visit: Payer: Medicare Other | Admitting: Cardiology

## 2020-03-26 ENCOUNTER — Other Ambulatory Visit: Payer: Self-pay

## 2020-03-26 ENCOUNTER — Encounter: Payer: Self-pay | Admitting: Cardiology

## 2020-03-26 VITALS — BP 146/82 | HR 87 | Temp 97.2°F | Ht 63.0 in | Wt 160.8 lb

## 2020-03-26 DIAGNOSIS — R600 Localized edema: Secondary | ICD-10-CM | POA: Diagnosis not present

## 2020-03-26 DIAGNOSIS — I872 Venous insufficiency (chronic) (peripheral): Secondary | ICD-10-CM

## 2020-03-26 DIAGNOSIS — Z953 Presence of xenogenic heart valve: Secondary | ICD-10-CM | POA: Diagnosis not present

## 2020-03-26 DIAGNOSIS — I25118 Atherosclerotic heart disease of native coronary artery with other forms of angina pectoris: Secondary | ICD-10-CM | POA: Diagnosis not present

## 2020-03-26 DIAGNOSIS — Z950 Presence of cardiac pacemaker: Secondary | ICD-10-CM

## 2020-03-26 NOTE — Patient Instructions (Signed)
Restrict salt intake  Increase torsemide to 20 mg daily  We will prescribe compression hose  Keep feet elevated as much as you can

## 2020-03-26 NOTE — Addendum Note (Signed)
Addended by: Kathyrn Lass on: 03/26/2020 05:02 PM   Modules accepted: Orders

## 2020-03-30 ENCOUNTER — Other Ambulatory Visit: Payer: Self-pay | Admitting: Cardiology

## 2020-03-30 NOTE — Telephone Encounter (Signed)
Rx(s) sent to pharmacy electronically.  

## 2020-04-09 LAB — BASIC METABOLIC PANEL
BUN/Creatinine Ratio: 16 (ref 10–24)
BUN: 24 mg/dL (ref 10–36)
CO2: 30 mmol/L — ABNORMAL HIGH (ref 20–29)
Calcium: 9 mg/dL (ref 8.6–10.2)
Chloride: 97 mmol/L (ref 96–106)
Creatinine, Ser: 1.46 mg/dL — ABNORMAL HIGH (ref 0.76–1.27)
GFR calc Af Amer: 45 mL/min/{1.73_m2} — ABNORMAL LOW (ref >59)
GFR calc non Af Amer: 39 mL/min/{1.73_m2} — ABNORMAL LOW (ref >59)
Glucose: 95 mg/dL (ref 65–99)
Potassium: 4.5 mmol/L (ref 3.5–5.2)
Sodium: 139 mmol/L (ref 134–144)

## 2020-04-11 ENCOUNTER — Ambulatory Visit: Payer: Medicare Other | Admitting: Cardiology

## 2020-04-12 NOTE — Telephone Encounter (Signed)
Spoke with the pts son Louie Casa and notified him of the pts lab results.   He wanted to report the pt has done very well with the compression stockings. He will continue to wear them.   He will continue to be sure the pt continues to try and hydrate well.   Will call or My Chart of he has any problems aor any further questions.

## 2020-04-16 ENCOUNTER — Encounter (INDEPENDENT_AMBULATORY_CARE_PROVIDER_SITE_OTHER): Payer: Medicare Other | Admitting: Ophthalmology

## 2020-04-19 ENCOUNTER — Ambulatory Visit (INDEPENDENT_AMBULATORY_CARE_PROVIDER_SITE_OTHER): Payer: Medicare Other | Admitting: Ophthalmology

## 2020-04-19 ENCOUNTER — Encounter (INDEPENDENT_AMBULATORY_CARE_PROVIDER_SITE_OTHER): Payer: Self-pay | Admitting: Ophthalmology

## 2020-04-19 ENCOUNTER — Other Ambulatory Visit: Payer: Self-pay

## 2020-04-19 DIAGNOSIS — H35351 Cystoid macular degeneration, right eye: Secondary | ICD-10-CM | POA: Diagnosis not present

## 2020-04-19 DIAGNOSIS — H35371 Puckering of macula, right eye: Secondary | ICD-10-CM | POA: Diagnosis not present

## 2020-04-19 NOTE — Progress Notes (Signed)
04/19/2020     CHIEF COMPLAINT Patient presents for Retina Follow Up   HISTORY OF PRESENT ILLNESS: Jordan [name] is a 86 y.o. male who presents to the clinic today for:   HPI    Retina Follow Up    Patient presents with  Other.  In right eye.  Duration of 8 weeks.  Since onset it is stable.          Comments    8 week follow up - OCT OU Patient denies change in vision and overall has no complaints. He reports no foreign body sensation until he uses the drop topically twice daily after with each time feeling somewhat of a foreign body sensation subsides and then recurs with each placement of the eyedrop        Last edited by Hurman Horn, MD on 04/19/2020 11:27 AM. (History)      Referring physician: Leanna Battles, MD [place],  [place] [id number]  HISTORICAL INFORMATION:   Selected notes from the [facility]    Lab Results  Component Value Date   HGBA1C 5.5 05/11/2015     CURRENT MEDICATIONS: No current outpatient medications on file. (Ophthalmic Drugs)   No current facility-administered medications for this visit. (Ophthalmic Drugs)   Current Outpatient Medications (Other)  Medication Sig  . acetaminophen (TYLENOL) 325 MG tablet Take 650 mg by mouth every 6 (six) hours as needed for mild pain, moderate pain or fever.   Marland Kitchen amLODipine (NORVASC) 5 MG tablet TAKE 1 TABLET BY MOUTH EVERY DAY (Patient taking differently: Take 5 mg by mouth daily. )  . atorvastatin (LIPITOR) 10 MG tablet Take 1 tablet (10 mg total) by mouth daily.  . cephALEXin (KEFLEX) 500 MG capsule Take 500 mg by mouth 3 (three) times daily.  . clopidogrel (PLAVIX) 75 MG tablet Take 1 tablet (75 mg total) by mouth daily.  Marland Kitchen NEXIUM 24HR 20 MG capsule TAKE 1 CAPSULE BY MOUTH DAILY AT 12 NOON  . silodosin (RAPAFLO) 4 MG CAPS capsule Take 4 mg by mouth every evening.   . torsemide (DEMADEX) 20 MG tablet Take 20 mg by mouth daily.   No current facility-administered  medications for this visit. (Other)      REVIEW OF SYSTEMS:    ALLERGIES Allergies  Allergen Reactions  . Aspirin Other (See Comments)    Stomach bleeds  . Nitroglycerin     Makes patient faint    PAST MEDICAL HISTORY Past Medical History:  Diagnosis Date  . AAA (abdominal aortic aneurysm) ([place])   . Anemia   . Aortic stenosis   . Arthritis 09/18/11   "in my knees"  . Basal cell carcinoma of skin   . Blood transfusion   . BPH (benign prostatic hyperplasia)   . CAD (coronary artery disease)    Remote stent to LAD in 1999. Last cath in 2004 and he is managed medically  . Colon polyps   . Complication of anesthesia   . Dysrhythmia    paced  . GERD (gastroesophageal reflux disease)   . Hemorrhoid   . HTN (hypertension)   . Hyperlipidemia   . Lung nodule    RLL  . 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 Renal mass, left   . 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  CARDIAC CATHETERIZATION  2004   Managed medically  . CARDIAC CATHETERIZATION    . CARDIAC CATHETERIZATION N/A 04/11/2015   Procedure: Right/Left Heart Cath and Coronary Angiography;  Surgeon: Burnell Blanks, MD;  Location: [facility] CV LAB;  Service: Cardiovascular;  Laterality: N/A;  . CORONARY ANGIOGRAM  09/19/2011   Procedure: CORONARY ANGIOGRAM;  Surgeon: Josue Hector, MD;  Location: [facility] CATH LAB;  Service: Cardiovascular;;  . CORONARY ANGIOPLASTY WITH STENT PLACEMENT  05/09/1998   "1"; LAD  . INSERT / REPLACE / REMOVE PACEMAKER  2004   initial placement; [employer]  . INSERT / REPLACE / REMOVE PACEMAKER  02/14/2011  . NASAL HEMORRHAGE CONTROL  1980's   Clips placed to stop bleeding  . SKIN CANCER EXCISION  09/18/11   "have had a couple hundred of them removed since I was in my 40's"  . TEE WITHOUT CARDIOVERSION N/A  05/15/2015   Procedure: TRANSESOPHAGEAL ECHOCARDIOGRAM (TEE);  Surgeon: Burnell Blanks, MD;  Location: [facility];  Service: Open Heart Surgery;  Laterality: N/A;  . TONSILLECTOMY     "when I was a teenager"  . TRANSCATHETER AORTIC VALVE REPLACEMENT, TRANSFEMORAL N/A 05/15/2015   Procedure: TRANSCATHETER AORTIC VALVE REPLACEMENT, TRANSFEMORAL;  Surgeon: Burnell Blanks, MD;  Location: [place];  Service: Open Heart Surgery;  Laterality: N/A;    FAMILY HISTORY Family History  Problem Relation Age of Onset  . Cancer Father        stomach  . Colon cancer Father   . Heart disease Brother   . Heart disease Brother   . Diabetes Other        granddaughter    SOCIAL HISTORY Social History   Tobacco Use  . Smoking status: Former Smoker    Packs/day: 1.00    Years: 42.00    Pack years: 42.00    Types: Cigarettes    Quit date: 08/18/1974    Years since quitting: 45.7  . Smokeless tobacco: Former Systems developer    Types: Secondary school teacher  . Vaping Use: Never used  Substance Use Topics  . Alcohol use: No    Alcohol/week: 0.0 standard drinks  . Drug use: No         OPHTHALMIC EXAM:  Base Eye Exam    Visual Acuity (Snellen - Linear)      Right Left   Dist [place] 20/70-2 20/100-2   Dist ph [place] 20/50-2 20/50-1       Tonometry (Tonopen, 10:37 AM)      Right Left   Pressure 17 10       Pupils      Pupils Dark Light Shape React APD   Right PERRL 4 4 Irregular Sluggish None   Left PERRL 4 4 Irregular Sluggish None       Visual Fields (Counting fingers)      Left Right    Full Full       Extraocular Movement      Right Left    Full Full       Neuro/Psych    Oriented x3: Yes   Mood/Affect: Normal       Dilation    Right eye: 1.0% Mydriacyl, 2.5% Phenylephrine @ 10:37 AM        Slit Lamp and Fundus Exam    External Exam      Right Left   External Normal Normal       Slit Lamp Exam      Right Left   Lids/Lashes Normal  Normal   Conjunctiva/Sclera White and  quiet White and quiet   Cornea Clear, secure, 2 corneal stitches remain with 1 suture tag slightly above the conjunctiva,  Clear   Anterior Chamber Deep and quiet Deep and quiet   Iris Round and reactive Round and reactive   Lens Anterior chamber intraocular lens Posterior chamber intraocular lens   Anterior Vitreous Normal Normal       Fundus Exam      Right Left   Macula Epiretinal membrane           IMAGING AND PROCEDURES  Imaging and Procedures for 04/19/20  OCT, Retina - OU - Both Eyes       Right Eye Quality was good. Scan locations included subfoveal. Central Foveal Thickness: 336. Findings include abnormal foveal contour, epiretinal membrane.   Left Eye Quality was good. Scan locations included subfoveal. Central Foveal Thickness: 273.   Notes Moderate epiretinal membrane OD, without intraretinal fluid or CME.  Intact outer photoreceptor layer.  There are drusen deposits at the level RPE of Bruch's membrane.  No active maculopathy.  OS continues to be normal.                ASSESSMENT/PLAN:  Cystoid macular edema of right eye Perifoveal CME has improved since last visit June 2021.  However foveal thickening remains due to overlying epiretinal membrane which is increased since his first visit in this office January 2021.  Discontinue topical NSAID OD.  Macular pucker, right eye Epiretinal membrane, now without CME post treatment with topical NSAIDs.  Thus the epiretinal membrane is a component of limiting his visual acuity in the right eye.  He is troubled by the current level of acuity of the right eye, vitrectomy and membrane peel could be offered.      ICD-10-CM   1. Macular pucker, right eye  H35.371 OCT, Retina - OU - Both Eyes  2. Cystoid macular edema of right eye  H35.351     1.  Patient does not have a foreign body sensation in the right eye despite having a small suture tag  temporally.  Thus I recommended leave the sutures alone for this  time  The topical medication also seem to trigger foreign body sensation.  2.  Epiretinal membrane OD, may be a component of his visual function now that CME OD has completely resolved.  3.  Continue topical medications OD and follow-up of the epiretinal membrane and offer surgical intervention should he choose to improve his visual functioning OD   4.  Reports that he still driving.  I do recommend he follow-up Dr. Nathanial Rancher for refraction because of his borderline visual functioning currently to be able to safely drive Ophthalmic Meds Ordered this visit:  No orders of the defined types were placed in this encounter.      Return in about 8 weeks (around 06/14/2020) for dilate, OD, OCT.  There are no Patient Instructions on file for this visit.   Explained the diagnoses, plan, and follow up with the patient and they expressed understanding.  Patient expressed understanding of the importance of proper follow up care.   Clent Demark Maryah Marinaro M.D. Diseases & Surgery of the Retina and Vitreous [facility]ina [facility]abetic [place] 04/19/20     Abbreviations: M myopia (nearsighted); A astigmatism; H hyperopia (farsighted); P presbyopia; Mrx spectacle prescription;  CTL contact lenses; OD right eye; OS left eye; OU both eyes  XT exotropia; ET esotropia; PEK punctate epithelial keratitis; PEE punctate  epithelial erosions; DES dry eye syndrome; MGD meibomian gland dysfunction; ATs artificial tears; PFAT's preservative free artificial tears; [place] nuclear sclerotic cataract; PSC posterior subcapsular cataract; ERM epi-retinal membrane; PVD posterior vitreous detachment; RD retinal detachment; DM diabetes mellitus; DR diabetic retinopathy; NPDR non-proliferative diabetic retinopathy; PDR proliferative diabetic retinopathy; CSME clinically significant macular edema; DME diabetic macular edema; dbh dot blot hemorrhages; CWS cotton wool spot; POAG primary open angle glaucoma; C/D cup-to-disc ratio; HVF humphrey  visual field; GVF goldmann visual field; OCT optical coherence tomography; IOP intraocular pressure; BRVO Branch retinal vein occlusion; CRVO central retinal vein occlusion; CRAO central retinal artery occlusion; BRAO branch retinal artery occlusion; RT retinal tear; SB scleral buckle; PPV pars plana vitrectomy; VH Vitreous hemorrhage; PRP panretinal laser photocoagulation; IVK intravitreal kenalog; VMT vitreomacular traction; MH Macular hole;  NVD neovascularization of the disc; NVE neovascularization elsewhere; AREDS age related eye disease study; ARMD age related macular degeneration; POAG primary open angle glaucoma; EBMD epithelial/anterior basement membrane dystrophy; ACIOL anterior chamber intraocular lens; IOL intraocular lens; PCIOL posterior chamber intraocular lens; Phaco/IOL phacoemulsification with intraocular lens placement; [place] photorefractive keratectomy; LASIK laser assisted in situ keratomileusis; HTN hypertension; DM diabetes mellitus; COPD chronic obstructive pulmonary disease

## 2020-04-19 NOTE — Assessment & Plan Note (Signed)
Epiretinal membrane, now without CME post treatment with topical NSAIDs.  Thus the epiretinal membrane is a component of limiting his visual acuity in the right eye.  He is troubled by the current level of acuity of the right eye, vitrectomy and membrane peel could be offered.

## 2020-04-19 NOTE — Assessment & Plan Note (Addendum)
Perifoveal CME has improved since last visit June 2021.  However foveal thickening remains due to overlying epiretinal membrane which is increased since his first visit in this office January 2021.  Discontinue topical NSAID OD.

## 2020-05-07 NOTE — Progress Notes (Signed)
Radiation Oncology         [phone] ________________________________  Initial Outpatient Consultation  Name: Jordan [name] MRN: [id number]  Date: 05/09/2020  DOB: 01-28-1918  KC:[id number], Jordan Quince, MD  Allyn Kenner, MD   REFERRING PHYSICIAN: Allyn Kenner, MD  DIAGNOSIS: The primary encounter diagnosis was Malignant neoplasm of skin of right upper extremity. Diagnoses of Squamous cell carcinoma of skin of scalp and Squamous cell cancer of multiple sites of skin of upper arm, right were also pertinent to this visit.  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 male who is seen as a courtesy of Dr. Nevada Crane for an opinion concerning radiation therapy as part of management for his recently diagnosed skin cancer. Today, he is accompanied by his son. The patient presented to Dr. Nevada Crane and underwent a biopsy of the top left scalp on 02/08/2020. Results showed moderately differentiated squamous cell carcinoma with deep margin involvement. Skin on right elbow was also biopsied at that time and showed residual dendritic junctional melanocytes without residual melanoma in-situ. Margins were free of melanocytic proliferation and melanoma in-situ. However, there was residual squamous cell carcinoma in-situ involving the lateral and deep margins.  PREVIOUS RADIATION THERAPY: No  PAST MEDICAL HISTORY:  Past Medical History:  Diagnosis Date  . AAA (abdominal aortic aneurysm) ([place])   . Anemia   . Aortic stenosis   . Arthritis 09/18/11   "in my knees"  . Basal cell carcinoma of skin   . Blood transfusion   . BPH (benign prostatic hyperplasia)   . CAD (coronary artery disease)    Remote stent to LAD in 1999. Last cath in 2004 and he is managed medically  . Colon polyps   . Complication of anesthesia   . Dysrhythmia    paced   . GERD (gastroesophageal reflux disease)   . Hemorrhoid   . HTN (hypertension)   . Hyperlipidemia   . Lung nodule    RLL  . 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 Renal mass, left   . 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 CARDIAC CATHETERIZATION  2004   Managed medically  . CARDIAC CATHETERIZATION    . CARDIAC CATHETERIZATION N/A 04/11/2015   Procedure: Right/Left Heart Cath and Coronary Angiography;  Surgeon: Burnell Blanks, MD;  Location: [facility] CV LAB;  Service: Cardiovascular;  Laterality: N/A;  . CORONARY ANGIOGRAM  09/19/2011   Procedure: CORONARY ANGIOGRAM;  Surgeon: Josue Hector, MD;  Location: [facility] CATH LAB;  Service: Cardiovascular;;  . CORONARY ANGIOPLASTY WITH STENT PLACEMENT  05/09/1998   "1"; LAD  . INSERT / REPLACE / REMOVE PACEMAKER  2004   initial placement; [employer]  . INSERT / REPLACE / REMOVE PACEMAKER  02/14/2011  . NASAL HEMORRHAGE CONTROL  1980's   Clips placed to stop bleeding  . SKIN CANCER EXCISION  09/18/11   "have had a couple hundred of them removed since I was in my 40's"  . TEE WITHOUT CARDIOVERSION N/A 05/15/2015   Procedure: TRANSESOPHAGEAL ECHOCARDIOGRAM (TEE);  Surgeon: Burnell Blanks, MD;  Location: [facility];  Service: Open Heart Surgery;  Laterality: N/A;  .  TONSILLECTOMY     "when I was a teenager"  . TRANSCATHETER AORTIC VALVE REPLACEMENT, TRANSFEMORAL N/A 05/15/2015   Procedure: TRANSCATHETER AORTIC VALVE REPLACEMENT, TRANSFEMORAL;  Surgeon: Burnell Blanks, MD;  Location: [facility];  Service: Open Heart Surgery;  Laterality: N/A;    FAMILY HISTORY:  Family History  Problem Relation Age of Onset  . Cancer Father        stomach  . Colon cancer Father   . Heart disease Brother   . Heart disease Brother   . Diabetes  Other        granddaughter    SOCIAL HISTORY:  Social History   Tobacco Use  . Smoking status: Former Smoker    Packs/day: 1.00    Years: 42.00    Pack years: 42.00    Types: Cigarettes    Quit date: 08/18/1974    Years since quitting: 45.7  . Smokeless tobacco: Former Systems developer    Types: Secondary school teacher  . Vaping Use: Never used  Substance Use Topics  . Alcohol use: No    Alcohol/week: 0.0 standard drinks  . Drug use: No    ALLERGIES:  Allergies  Allergen Reactions  . Aspirin Other (See Comments)    Stomach bleeds  . Nitroglycerin     Makes patient faint    MEDICATIONS:  Current Outpatient Medications  Medication Sig Dispense Refill  . acetaminophen (TYLENOL) 325 MG tablet Take 650 mg by mouth every 6 (six) hours as needed for mild pain, moderate pain or fever.     Marland Kitchen amLODipine (NORVASC) 5 MG tablet TAKE 1 TABLET BY MOUTH EVERY DAY (Patient taking differently: Take 5 mg by mouth daily. ) 90 tablet 2  . atorvastatin (LIPITOR) 10 MG tablet Take 1 tablet (10 mg total) by mouth daily. 90 tablet 3  . clopidogrel (PLAVIX) 75 MG tablet Take 1 tablet (75 mg total) by mouth daily. 90 tablet 3  . NEXIUM 24HR 20 MG capsule TAKE 1 CAPSULE BY MOUTH DAILY AT 12 NOON 84 capsule 4  . silodosin (RAPAFLO) 4 MG CAPS capsule Take 4 mg by mouth every evening.     . torsemide (DEMADEX) 20 MG tablet Take 20 mg by mouth daily.     No current facility-administered medications for this encounter.    REVIEW OF SYSTEMS:  A 10+ POINT REVIEW OF SYSTEMS WAS OBTAINED including neurology, dermatology, psychiatry, cardiac, respiratory, lymph, extremities, GI, GU, musculoskeletal, constitutional, reproductive, HEENT.  Reports occasional drainage from the scalp lesion.  Denies any pain in either side   PHYSICAL EXAM:  height is 5\' 3"  (1.6 m) and weight is 154 lb 9.6 oz (70.1 kg). His temperature is 98.3 F (36.8 C). His blood pressure is 126/65 and his pulse is 103 (abnormal). His respiration is 18 and  oxygen saturation is 96%.   General: Alert and oriented, in no acute distress, somewhat hard of hearing HEENT: Head is normocephalic. Extraocular movements are intact. Oropharynx is clear. Neck: Neck is supple, no palpable cervical or supraclavicular lymphadenopathy.  No preauricular or occipital adenopathy. Heart: Regular in rate and rhythm with no murmurs, rubs, or gallops. Chest: Clear to auscultation bilaterally, with no rhonchi, wheezes, or rales. Abdomen: Soft, nontender, nondistended, with no rigidity or guarding. Extremities: No cyanosis or edema.  Erythematous area measuring approximately 5 x 6 cm located along the right elbow region.  The vertex scalp area shows a 5 x 5 cm area of eschar.  Some occasional serosanguineous drainage from this area.  No active bleeding.  **See enclosed photos in nursing note** Lymphatics: see Neck Exam Skin: Significant solar damage throughout much of the skin in the upper body Musculoskeletal: symmetric strength and muscle tone throughout. Neurologic: Cranial nerves II through XII are grossly intact. No obvious focalities. Speech is fluent. Coordination is intact. Psychiatric: Judgment and insight are intact. Affect is appropriate.   ECOG = 1  0 - Asymptomatic (Fully active, able to carry on all predisease activities without restriction)  1 - Symptomatic but completely ambulatory (Restricted in physically strenuous activity but ambulatory and able to carry out work of a light or sedentary nature. For example, light housework, office work)  2 - Symptomatic, <50% in bed during the day (Ambulatory and capable of all self care but unable to carry out any work activities. Up and about more than 50% of waking hours)  3 - Symptomatic, >50% in bed, but not bedbound (Capable of only limited self-care, confined to bed or chair 50% or more of waking hours)  4 - Bedbound (Completely disabled. Cannot carry on any self-care. Totally confined to bed or chair)  5 -  Death   Eustace Pen MM, Creech RH, Tormey DC, et al. [phone]). "Toxicity and response criteria of the [facility] Group". Cut Off Oncol. 5 (6): 649-55  LABORATORY DATA:  Lab Results  Component Value Date   WBC 9.6 09/25/2019   HGB 12.8 (L) 09/25/2019   HCT 40.5 09/25/2019   MCV 93.3 09/25/2019   PLT 130 (L) 09/25/2019   NEUTROABS 6.5 09/24/2019   Lab Results  Component Value Date   NA 139 04/09/2020   K 4.5 04/09/2020   CL 97 04/09/2020   CO2 30 (H) 04/09/2020   GLUCOSE 95 04/09/2020   CREATININE 1.46 (H) 04/09/2020   CALCIUM 9.0 04/09/2020      RADIOGRAPHY: OCT, Retina - OU - Both Eyes  Result Date: 04/19/2020 Right Eye Quality was good. Scan locations included subfoveal. Central Foveal Thickness: 336. Findings include abnormal foveal contour, epiretinal membrane. Left Eye Quality was good. Scan locations included subfoveal. Central Foveal Thickness: 273. Notes Moderate epiretinal membrane OD, without intraretinal fluid or CME.  Intact outer photoreceptor layer.  There are drusen deposits at the level RPE of Bruch's membrane. No active maculopathy. OS continues to be normal.     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  Patient has a large area of involvement over approximately 5 x 5 cm.  We discussed potential surgery for this area.  Given the size of the lesion the patient will likely require skin grafting.  Given the patient's advanced age of 86 he does not wish to consider surgery along this area.  Patient also has a erythematous lesion along the right elbow with biopsy showing at least squamous cell carcinoma in situ.  He does not wish to consider surgery along this region which also would likely require skin grafting.  Patient would be a good candidate for definitive course of radiation therapy to both areas.  Treatments would extend over approximately 4 to 5 weeks on a daily basis.  The patient will be treated with  superficial radiation therapy i.e. electrons.  Today, I talked to the patient and son about the findings and work-up thus far.  We discussed the natural history of skin cancer and general treatment, highlighting the role of radiotherapy (electron therapy) in the management.  We discussed the available radiation techniques, and focused on the details of logistics and delivery.  We reviewed the anticipated acute and late sequelae associated with radiation in this setting.  The patient was encouraged to ask questions that I answered to the best of my ability.  A patient consent form was discussed and signed.  We retained a copy for our records.  The patient would like to proceed with radiation and will be scheduled for  simulation.  PLAN: Patient will proceed with development of a custom positioning device for his scalp and right arm treatment. After these are complete the patient will then be set up for custom electron cutout field directed at the scalp lesion and the arm/elbow region.  Total time spent in this encounter was 50 minutes which included reviewing the patient's most recent scalp biopsy, physical examination, and documentation.  ------------------------------------------------  Blair Promise, PhD, MD  This document serves as a record of services personally performed by Gery Pray, MD. It was created on his behalf by Clerance Lav, a trained medical scribe. The creation of this record is based on the scribe's personal observations and the provider's statements to them. This document has been checked and approved by the attending provider.

## 2020-05-09 ENCOUNTER — Ambulatory Visit
Admission: RE | Admit: 2020-05-09 | Discharge: 2020-05-09 | Disposition: A | Discharge status: Home / Self Care | Payer: Medicare Other | Source: Ambulatory Visit | Attending: Radiation Oncology | Admitting: Radiation Oncology

## 2020-05-09 ENCOUNTER — Encounter: Payer: Self-pay | Admitting: Radiation Oncology

## 2020-05-09 ENCOUNTER — Other Ambulatory Visit: Payer: Self-pay

## 2020-05-09 VITALS — BP 126/65 | HR 103 | Temp 98.3°F | Resp 18 | Ht 63.0 in | Wt 154.6 lb

## 2020-05-09 DIAGNOSIS — Z86010 Personal history of colonic polyps: Secondary | ICD-10-CM | POA: Insufficient documentation

## 2020-05-09 DIAGNOSIS — Z79899 Other long term (current) drug therapy: Secondary | ICD-10-CM | POA: Insufficient documentation

## 2020-05-09 DIAGNOSIS — N400 Enlarged prostate without lower urinary tract symptoms: Secondary | ICD-10-CM | POA: Insufficient documentation

## 2020-05-09 DIAGNOSIS — I350 Nonrheumatic aortic (valve) stenosis: Secondary | ICD-10-CM | POA: Insufficient documentation

## 2020-05-09 DIAGNOSIS — E785 Hyperlipidemia, unspecified: Secondary | ICD-10-CM | POA: Insufficient documentation

## 2020-05-09 DIAGNOSIS — C4442 Squamous cell carcinoma of skin of scalp and neck: Secondary | ICD-10-CM

## 2020-05-09 DIAGNOSIS — Z800 Family history of malignant neoplasm of digestive organs: Secondary | ICD-10-CM | POA: Diagnosis not present

## 2020-05-09 DIAGNOSIS — Z87891 Personal history of nicotine dependence: Secondary | ICD-10-CM | POA: Insufficient documentation

## 2020-05-09 DIAGNOSIS — I714 Abdominal aortic aneurysm, without rupture: Secondary | ICD-10-CM | POA: Insufficient documentation

## 2020-05-09 DIAGNOSIS — C44602 Unspecified malignant neoplasm of skin of right upper limb, including shoulder: Secondary | ICD-10-CM

## 2020-05-09 DIAGNOSIS — M129 Arthropathy, unspecified: Secondary | ICD-10-CM | POA: Insufficient documentation

## 2020-05-09 DIAGNOSIS — C44622 Squamous cell carcinoma of skin of right upper limb, including shoulder: Secondary | ICD-10-CM

## 2020-05-09 DIAGNOSIS — K219 Gastro-esophageal reflux disease without esophagitis: Secondary | ICD-10-CM | POA: Diagnosis not present

## 2020-05-09 DIAGNOSIS — I2510 Atherosclerotic heart disease of native coronary artery without angina pectoris: Secondary | ICD-10-CM | POA: Insufficient documentation

## 2020-05-09 DIAGNOSIS — I10 Essential (primary) hypertension: Secondary | ICD-10-CM | POA: Diagnosis not present

## 2020-05-09 DIAGNOSIS — R911 Solitary pulmonary nodule: Secondary | ICD-10-CM | POA: Insufficient documentation

## 2020-05-09 NOTE — Progress Notes (Signed)
Received patient and his son in the clinic as a referral from Dr. Nevada Crane. Heart rate slightly elevated. Patient denies pain. Patient has a pacemaker that is managed by Dr. [place]. Patient has another pacemaker check scheduled for 06/05/20. Patient is hard of hearing. Patient's wife had breast cancer and received radiation therapy. Patient denies a history of radiation therapy to himself though. Skin lesions of right elbow and crown of scalp are scabbed without drainage or pain.   BP 126/65   Pulse (!) 103   Temp 98.3 F (36.8 C)   Resp 18   Ht 5\' 3"  (1.6 m)   Wt 154 lb 9.6 oz (70.1 kg)   SpO2 96%   BMI 27.39 kg/m  Wt Readings from Last 3 Encounters:  05/09/20 154 lb 9.6 oz (70.1 kg)  03/26/20 160 lb 12.8 oz (72.9 kg)  02/09/20 159 lb 12.8 oz (72.5 kg)

## 2020-05-09 NOTE — Progress Notes (Signed)
Right elbow:   Crown/scalp:

## 2020-05-13 ENCOUNTER — Other Ambulatory Visit: Payer: Self-pay | Admitting: Cardiology

## 2020-05-17 ENCOUNTER — Ambulatory Visit
Admission: RE | Admit: 2020-05-17 | Discharge: 2020-05-17 | Disposition: A | Discharge status: Home / Self Care | Payer: Medicare Other | Source: Ambulatory Visit | Attending: Radiation Oncology | Admitting: Radiation Oncology

## 2020-05-17 ENCOUNTER — Other Ambulatory Visit: Payer: Self-pay

## 2020-05-17 DIAGNOSIS — C44622 Squamous cell carcinoma of skin of right upper limb, including shoulder: Secondary | ICD-10-CM

## 2020-05-17 DIAGNOSIS — Z510 Encounter for antineoplastic radiation therapy: Secondary | ICD-10-CM | POA: Insufficient documentation

## 2020-05-17 DIAGNOSIS — C4442 Squamous cell carcinoma of skin of scalp and neck: Secondary | ICD-10-CM | POA: Insufficient documentation

## 2020-05-22 DIAGNOSIS — C4442 Squamous cell carcinoma of skin of scalp and neck: Secondary | ICD-10-CM | POA: Diagnosis not present

## 2020-05-23 ENCOUNTER — Ambulatory Visit
Admission: RE | Admit: 2020-05-23 | Discharge: 2020-05-23 | Disposition: A | Discharge status: Home / Self Care | Payer: Medicare Other | Source: Ambulatory Visit | Attending: Radiation Oncology | Admitting: Radiation Oncology

## 2020-05-23 DIAGNOSIS — C44622 Squamous cell carcinoma of skin of right upper limb, including shoulder: Secondary | ICD-10-CM

## 2020-05-23 DIAGNOSIS — C4442 Squamous cell carcinoma of skin of scalp and neck: Secondary | ICD-10-CM | POA: Diagnosis not present

## 2020-05-24 ENCOUNTER — Other Ambulatory Visit: Payer: Self-pay

## 2020-05-24 ENCOUNTER — Ambulatory Visit
Admission: RE | Admit: 2020-05-24 | Discharge: 2020-05-24 | Disposition: A | Discharge status: Home / Self Care | Payer: Medicare Other | Source: Ambulatory Visit | Attending: Radiation Oncology | Admitting: Radiation Oncology

## 2020-05-24 ENCOUNTER — Ambulatory Visit: Payer: Medicare Other | Admitting: Radiation Oncology

## 2020-05-24 DIAGNOSIS — C4442 Squamous cell carcinoma of skin of scalp and neck: Secondary | ICD-10-CM | POA: Diagnosis not present

## 2020-05-25 ENCOUNTER — Ambulatory Visit
Admission: RE | Admit: 2020-05-25 | Discharge: 2020-05-25 | Disposition: A | Discharge status: Home / Self Care | Payer: Medicare Other | Source: Ambulatory Visit | Attending: Radiation Oncology | Admitting: Radiation Oncology

## 2020-05-25 DIAGNOSIS — C4442 Squamous cell carcinoma of skin of scalp and neck: Secondary | ICD-10-CM | POA: Diagnosis not present

## 2020-05-28 ENCOUNTER — Other Ambulatory Visit: Payer: Self-pay

## 2020-05-28 ENCOUNTER — Ambulatory Visit
Admission: RE | Admit: 2020-05-28 | Discharge: 2020-05-28 | Disposition: A | Discharge status: Home / Self Care | Payer: Medicare Other | Source: Ambulatory Visit | Attending: Radiation Oncology | Admitting: Radiation Oncology

## 2020-05-28 DIAGNOSIS — C4442 Squamous cell carcinoma of skin of scalp and neck: Secondary | ICD-10-CM | POA: Diagnosis not present

## 2020-05-29 ENCOUNTER — Ambulatory Visit
Admission: RE | Admit: 2020-05-29 | Discharge: 2020-05-29 | Disposition: A | Discharge status: Home / Self Care | Payer: Medicare Other | Source: Ambulatory Visit | Attending: Radiation Oncology | Admitting: Radiation Oncology

## 2020-05-29 ENCOUNTER — Other Ambulatory Visit: Payer: Self-pay

## 2020-05-29 ENCOUNTER — Telehealth: Payer: Self-pay | Admitting: *Deleted

## 2020-05-29 DIAGNOSIS — C4442 Squamous cell carcinoma of skin of scalp and neck: Secondary | ICD-10-CM | POA: Diagnosis not present

## 2020-05-29 NOTE — Telephone Encounter (Signed)
Patient was evaluated during weekly under treat with Dr. Sondra Come.  He wants the patient to be brought back to the room to have hydrogen perioxide treatment to the scalp.  Dermatologist had soaked the area with hydrogen perioxide for 10-15 minutes and afterwards the scab was easily removed and Dr Sondra Come wants to try this in the office as the son does not feel comfortable doing this at home incase some minor bleeding occurs.    Appt to have this done in office was scheduled after radiation appointment.

## 2020-05-30 ENCOUNTER — Other Ambulatory Visit: Payer: Self-pay

## 2020-05-30 ENCOUNTER — Ambulatory Visit
Admission: RE | Admit: 2020-05-30 | Discharge: 2020-05-30 | Disposition: A | Discharge status: Home / Self Care | Payer: Medicare Other | Source: Ambulatory Visit | Attending: Radiation Oncology | Admitting: Radiation Oncology

## 2020-05-30 DIAGNOSIS — C4442 Squamous cell carcinoma of skin of scalp and neck: Secondary | ICD-10-CM | POA: Diagnosis not present

## 2020-05-31 ENCOUNTER — Ambulatory Visit
Admission: RE | Admit: 2020-05-31 | Discharge: 2020-05-31 | Disposition: A | Discharge status: Home / Self Care | Payer: Medicare Other | Source: Ambulatory Visit | Attending: Radiation Oncology | Admitting: Radiation Oncology

## 2020-05-31 DIAGNOSIS — C4442 Squamous cell carcinoma of skin of scalp and neck: Secondary | ICD-10-CM | POA: Diagnosis not present

## 2020-06-01 ENCOUNTER — Other Ambulatory Visit: Payer: Self-pay

## 2020-06-01 ENCOUNTER — Ambulatory Visit
Admission: RE | Admit: 2020-06-01 | Discharge: 2020-06-01 | Disposition: A | Discharge status: Home / Self Care | Payer: Medicare Other | Source: Ambulatory Visit | Attending: Radiation Oncology | Admitting: Radiation Oncology

## 2020-06-01 DIAGNOSIS — C4442 Squamous cell carcinoma of skin of scalp and neck: Secondary | ICD-10-CM | POA: Diagnosis not present

## 2020-06-04 ENCOUNTER — Ambulatory Visit
Admission: RE | Admit: 2020-06-04 | Discharge: 2020-06-04 | Disposition: A | Discharge status: Home / Self Care | Payer: Medicare Other | Source: Ambulatory Visit | Attending: Radiation Oncology | Admitting: Radiation Oncology

## 2020-06-04 ENCOUNTER — Other Ambulatory Visit: Payer: Self-pay

## 2020-06-04 DIAGNOSIS — C4442 Squamous cell carcinoma of skin of scalp and neck: Secondary | ICD-10-CM | POA: Diagnosis not present

## 2020-06-05 ENCOUNTER — Ambulatory Visit
Admission: RE | Admit: 2020-06-05 | Discharge: 2020-06-05 | Disposition: A | Discharge status: Home / Self Care | Payer: Medicare Other | Source: Ambulatory Visit | Attending: Radiation Oncology | Admitting: Radiation Oncology

## 2020-06-05 ENCOUNTER — Ambulatory Visit (INDEPENDENT_AMBULATORY_CARE_PROVIDER_SITE_OTHER): Payer: Medicare Other | Admitting: *Deleted

## 2020-06-05 ENCOUNTER — Other Ambulatory Visit: Payer: Self-pay

## 2020-06-05 DIAGNOSIS — C44622 Squamous cell carcinoma of skin of right upper limb, including shoulder: Secondary | ICD-10-CM

## 2020-06-05 DIAGNOSIS — I441 Atrioventricular block, second degree: Secondary | ICD-10-CM | POA: Diagnosis not present

## 2020-06-05 DIAGNOSIS — C4442 Squamous cell carcinoma of skin of scalp and neck: Secondary | ICD-10-CM | POA: Diagnosis not present

## 2020-06-05 LAB — CUP PACEART REMOTE DEVICE CHECK
Battery Impedance: 3446 Ohm
Battery Remaining Longevity: 15 mo
Battery Voltage: 2.69 V
Brady Statistic AP VP Percent: 11 %
Brady Statistic AP VS Percent: 0 %
Brady Statistic AS VP Percent: 88 %
Brady Statistic AS VS Percent: 1 %
Date Time Interrogation Session: 20210921120109
Implantable Lead Implant Date: 20040811
Implantable Lead Implant Date: 20040811
Implantable Lead Location: 753859
Implantable Lead Location: 753860
Implantable Lead Model: 4469
Implantable Lead Model: 4470
Implantable Lead Serial Number: 421267
Implantable Lead Serial Number: 436022
Implantable Pulse Generator Implant Date: 20120601
Lead Channel Impedance Value: 491 Ohm
Lead Channel Impedance Value: 501 Ohm
Lead Channel Pacing Threshold Amplitude: 0.875 V
Lead Channel Pacing Threshold Amplitude: 1.125 V
Lead Channel Pacing Threshold Pulse Width: 0.4 ms
Lead Channel Pacing Threshold Pulse Width: 0.4 ms
Lead Channel Setting Pacing Amplitude: 2 V
Lead Channel Setting Pacing Amplitude: 2.5 V
Lead Channel Setting Pacing Pulse Width: 0.4 ms
Lead Channel Setting Sensing Sensitivity: 1 mV

## 2020-06-05 MED ORDER — SONAFINE EX EMUL
1.0000 "application " | Freq: Two times a day (BID) | CUTANEOUS | Status: DC
  Administered 2020-06-05: 1 via TOPICAL

## 2020-06-06 ENCOUNTER — Other Ambulatory Visit: Payer: Self-pay

## 2020-06-06 ENCOUNTER — Ambulatory Visit
Admission: RE | Admit: 2020-06-06 | Discharge: 2020-06-06 | Disposition: A | Discharge status: Home / Self Care | Payer: Medicare Other | Source: Ambulatory Visit | Attending: Radiation Oncology | Admitting: Radiation Oncology

## 2020-06-06 DIAGNOSIS — C4442 Squamous cell carcinoma of skin of scalp and neck: Secondary | ICD-10-CM | POA: Diagnosis not present

## 2020-06-06 NOTE — Progress Notes (Signed)
Remote pacemaker transmission.   

## 2020-06-07 ENCOUNTER — Other Ambulatory Visit: Payer: Self-pay

## 2020-06-07 ENCOUNTER — Ambulatory Visit
Admission: RE | Admit: 2020-06-07 | Discharge: 2020-06-07 | Disposition: A | Discharge status: Home / Self Care | Payer: Medicare Other | Source: Ambulatory Visit | Attending: Radiation Oncology | Admitting: Radiation Oncology

## 2020-06-07 DIAGNOSIS — C4442 Squamous cell carcinoma of skin of scalp and neck: Secondary | ICD-10-CM | POA: Diagnosis not present

## 2020-06-08 ENCOUNTER — Other Ambulatory Visit: Payer: Self-pay

## 2020-06-08 ENCOUNTER — Ambulatory Visit
Admission: RE | Admit: 2020-06-08 | Discharge: 2020-06-08 | Disposition: A | Discharge status: Home / Self Care | Payer: Medicare Other | Source: Ambulatory Visit | Attending: Radiation Oncology | Admitting: Radiation Oncology

## 2020-06-08 DIAGNOSIS — C4442 Squamous cell carcinoma of skin of scalp and neck: Secondary | ICD-10-CM | POA: Diagnosis not present

## 2020-06-11 ENCOUNTER — Ambulatory Visit
Admission: RE | Admit: 2020-06-11 | Discharge: 2020-06-11 | Disposition: A | Discharge status: Home / Self Care | Payer: Medicare Other | Source: Ambulatory Visit | Attending: Radiation Oncology | Admitting: Radiation Oncology

## 2020-06-11 ENCOUNTER — Other Ambulatory Visit: Payer: Self-pay

## 2020-06-11 DIAGNOSIS — C4442 Squamous cell carcinoma of skin of scalp and neck: Secondary | ICD-10-CM | POA: Diagnosis not present

## 2020-06-12 ENCOUNTER — Ambulatory Visit
Admission: RE | Admit: 2020-06-12 | Discharge: 2020-06-12 | Disposition: A | Discharge status: Home / Self Care | Payer: Medicare Other | Source: Ambulatory Visit | Attending: Radiation Oncology | Admitting: Radiation Oncology

## 2020-06-12 DIAGNOSIS — C4442 Squamous cell carcinoma of skin of scalp and neck: Secondary | ICD-10-CM | POA: Diagnosis not present

## 2020-06-13 ENCOUNTER — Other Ambulatory Visit: Payer: Self-pay

## 2020-06-13 ENCOUNTER — Ambulatory Visit
Admission: RE | Admit: 2020-06-13 | Discharge: 2020-06-13 | Disposition: A | Discharge status: Home / Self Care | Payer: Medicare Other | Source: Ambulatory Visit | Attending: Radiation Oncology | Admitting: Radiation Oncology

## 2020-06-13 DIAGNOSIS — C4442 Squamous cell carcinoma of skin of scalp and neck: Secondary | ICD-10-CM | POA: Diagnosis not present

## 2020-06-14 ENCOUNTER — Ambulatory Visit
Admission: RE | Admit: 2020-06-14 | Discharge: 2020-06-14 | Disposition: A | Discharge status: Home / Self Care | Payer: Medicare Other | Source: Ambulatory Visit | Attending: Radiation Oncology | Admitting: Radiation Oncology

## 2020-06-14 ENCOUNTER — Encounter (INDEPENDENT_AMBULATORY_CARE_PROVIDER_SITE_OTHER): Payer: Medicare Other | Admitting: Ophthalmology

## 2020-06-14 ENCOUNTER — Other Ambulatory Visit: Payer: Self-pay

## 2020-06-14 DIAGNOSIS — C4442 Squamous cell carcinoma of skin of scalp and neck: Secondary | ICD-10-CM | POA: Diagnosis not present

## 2020-06-15 ENCOUNTER — Ambulatory Visit
Admission: RE | Admit: 2020-06-15 | Discharge: 2020-06-15 | Disposition: A | Discharge status: Home / Self Care | Payer: Medicare Other | Source: Ambulatory Visit | Attending: Radiation Oncology | Admitting: Radiation Oncology

## 2020-06-15 DIAGNOSIS — C4442 Squamous cell carcinoma of skin of scalp and neck: Secondary | ICD-10-CM | POA: Insufficient documentation

## 2020-06-15 DIAGNOSIS — Z510 Encounter for antineoplastic radiation therapy: Secondary | ICD-10-CM | POA: Insufficient documentation

## 2020-06-18 ENCOUNTER — Ambulatory Visit
Admission: RE | Admit: 2020-06-18 | Discharge: 2020-06-18 | Disposition: A | Discharge status: Home / Self Care | Payer: Medicare Other | Source: Ambulatory Visit | Attending: Radiation Oncology | Admitting: Radiation Oncology

## 2020-06-18 DIAGNOSIS — C4442 Squamous cell carcinoma of skin of scalp and neck: Secondary | ICD-10-CM | POA: Diagnosis not present

## 2020-06-19 ENCOUNTER — Ambulatory Visit
Admission: RE | Admit: 2020-06-19 | Discharge: 2020-06-19 | Disposition: A | Discharge status: Home / Self Care | Payer: Medicare Other | Source: Ambulatory Visit | Attending: Radiation Oncology | Admitting: Radiation Oncology

## 2020-06-19 ENCOUNTER — Encounter: Payer: Self-pay | Admitting: Radiation Oncology

## 2020-06-19 DIAGNOSIS — C4442 Squamous cell carcinoma of skin of scalp and neck: Secondary | ICD-10-CM | POA: Diagnosis not present

## 2020-06-20 ENCOUNTER — Ambulatory Visit: Payer: Medicare Other

## 2020-06-21 NOTE — Progress Notes (Signed)
Jordan [name] Date of Birth: [birth year]   History of Present Illness: Jordan [name] is seen today for follow up edema.   He has a history of coronary disease with remote stenting of the LAD in 1999. Cardiac catheterization January 2013 showed nonobstructive disease. He has a history of bradycardia requiring pacemaker implant. He has a history of severe aortic stenosis. He underwent TAVR on 05/15/15.  Follow up Echo in September 2017 looked good.   He has a 4.6 cm abdominal aortic aneurysm followed  by [facility]- last checked in October 2019. Stable at 4.7 cm.  Not a candidate for stent grafting.   He was admitted in January 2021 with  a fall. Had post concussion confusion. Son thinks he may have fallen when he got up to urinate at night without the light on. Was orthostatic. Troponin elevated 2562> 2467. Seen by Dr Lovena Le. No clear evidence of ACS by history. Echo was OK and pacemaker checked out. CT of head without acute change. Felt he had some vagal response related to abdominal pain.  He was seen this spring with increased LE edema. Doppler was negative for DVT.  Was placed on lasix 20 mg daily and later switched to torsemide 10 mg daily. This was increased to 20 mg daily on his last visit. Since then his swelling has resolved. He is wearing compression hose still.  He has completed RT for scalp cancer.    Current Outpatient Medications on File Prior to Visit  Medication Sig Dispense Refill  . acetaminophen (TYLENOL) 325 MG tablet Take 650 mg by mouth every 6 (six) hours as needed for mild pain, moderate pain or fever.     Marland Kitchen amLODipine (NORVASC) 5 MG tablet TAKE 1 TABLET BY MOUTH EVERY DAY. NEED OFFICE VISIT 90 tablet 2  . atorvastatin (LIPITOR) 10 MG tablet Take 1 tablet (10 mg total) by mouth daily. 90 tablet 3  . clopidogrel (PLAVIX) 75 MG tablet Take 1 tablet (75 mg total) by mouth daily. 90 tablet 3  . NEXIUM 24HR 20 MG capsule TAKE 1 CAPSULE BY MOUTH DAILY AT 12 NOON 84 capsule 4  .  silodosin (RAPAFLO) 4 MG CAPS capsule Take 4 mg by mouth every evening.     . torsemide (DEMADEX) 20 MG tablet Take 10 mg by mouth daily.     No current facility-administered medications on file prior to visit.    Allergies  Allergen Reactions  . Aspirin Other (See Comments)    Stomach bleeds  . Nitroglycerin     Makes patient faint    Past Medical History:  Diagnosis Date  . AAA (abdominal aortic aneurysm) ([place])   . Anemia   . Aortic stenosis   . Arthritis 09/18/11   "in my knees"  . Basal cell carcinoma of skin   . Blood transfusion   . BPH (benign prostatic hyperplasia)   . CAD (coronary artery disease)    Remote stent to LAD in 1999. Last cath in 2004 and he is managed medically  . Colon polyps   . Complication of anesthesia   . Dysrhythmia    paced  . GERD (gastroesophageal reflux disease)   . Hemorrhoid   . HTN (hypertension)   . Hyperlipidemia   . Lung nodule    RLL  . 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 Renal mass, left   . 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 CARDIAC CATHETERIZATION  2004   Managed medically  . CARDIAC CATHETERIZATION    . CARDIAC CATHETERIZATION N/A 04/11/2015   Procedure: Right/Left Heart Cath and Coronary Angiography;  Surgeon: Burnell Blanks, MD;  Location: [facility];  Service: Cardiovascular;  Laterality: N/A;  . CORONARY ANGIOGRAM  09/19/2011   Procedure: CORONARY ANGIOGRAM;  Surgeon: Josue Hector, MD;  Location: [facility] CATH [facility];  Service: Cardiovascular;;  . CORONARY ANGIOPLASTY WITH STENT PLACEMENT  05/09/1998   "1"; LAD  . INSERT / REPLACE / REMOVE PACEMAKER  2004   initial placement; [employer]  . INSERT / REPLACE / REMOVE PACEMAKER  02/14/2011  . NASAL HEMORRHAGE CONTROL  1980's   Clips placed to stop bleeding  . SKIN CANCER  EXCISION  09/18/11   "have had a couple hundred of them removed since I was in my 40's"  . TEE WITHOUT CARDIOVERSION N/A 05/15/2015   Procedure: TRANSESOPHAGEAL ECHOCARDIOGRAM (TEE);  Surgeon: Burnell Blanks, MD;  Location: [facility];  Service: Open Heart Surgery;  Laterality: N/A;  . TONSILLECTOMY     "when I was a teenager"  . TRANSCATHETER AORTIC VALVE REPLACEMENT, TRANSFEMORAL N/A 05/15/2015   Procedure: TRANSCATHETER AORTIC VALVE REPLACEMENT, TRANSFEMORAL;  Surgeon: Burnell Blanks, MD;  Location: [facility];  Service: Open Heart Surgery;  Laterality: N/A;    Social History   Tobacco Use  Smoking Status Former Smoker  . Packs/day: 1.00  . Years: 42.00  . Pack years: 42.00  . Types: Cigarettes  . Quit date: 08/18/1974  . Years since quitting: 45.8  Smokeless Tobacco Former Systems developer  . Types: Chew    Social History   Substance and Sexual Activity  Alcohol Use No  . Alcohol/week: 0.0 standard drinks    Family History  Problem Relation Age of Onset  . Cancer Father        stomach  . Colon cancer Father   . Heart disease Brother   . Heart disease Brother   . Diabetes Other        granddaughter    Review of Systems: As noted in history of present illness. All other systems were reviewed and are negative.  Physical Exam: BP 106/60   Pulse 88   Ht 5\' 3"  (1.6 m)   Wt 152 lb 4 oz (69.1 kg)   SpO2 96%   BMI 26.97 kg/m  GENERAL:  Well appearing elderly WM in NAD HEENT:  PERRL, EOMI, sclera are clear. Oropharynx is clear. NECK:  No jugular venous distention, carotid upstroke brisk and symmetric, no bruits, no thyromegaly or adenopathy LUNGS:  Basilar crackles R>L CHEST:  Unremarkable HEART:  RRR,  PMI not displaced or sustained,S1 and S2 within normal limits, no S3, no S4: no clicks, no rubs, gr 2/6 systolic murmur. ABD:  Soft, nontender. BS +, no masses or bruits. No hepatomegaly, no splenomegaly EXT:  2 + pulses throughout, no LE edema. SKIN:  Warm and dry.  No  rashes NEURO:  Alert and oriented x 3. Cranial nerves II through XII intact. PSYCH:  Cognitively intact    LABORATORY DATA: Lab Results  Component Value Date   WBC 9.6 09/25/2019   HGB 12.8 (L) 09/25/2019   HCT 40.5 09/25/2019   PLT 130 (L) 09/25/2019   GLUCOSE 95 04/09/2020   CHOL 104 09/19/2011   TRIG 66 09/19/2011   HDL 38 (L) 09/19/2011  LDLCALC 53 09/19/2011   ALT 20 09/24/2019   AST 41 09/24/2019   NA 139 04/09/2020   K 4.5 04/09/2020   CL 97 04/09/2020   CREATININE 1.46 (H) 04/09/2020   BUN 24 04/09/2020   CO2 30 (H) 04/09/2020   TSH 0.797 09/18/2011   INR 1.1 09/24/2019   HGBA1C 5.5 05/11/2015   Labs dated 07/05/18: Normal CBC and CMET. Cholesterol 120, triglycerides 131, HDL 36, LDL 58.   Echo: 05/28/16:  Study Conclusions  - Left ventricle: The cavity size was mildly dilated. Wall   thickness was increased in a pattern of moderate LVH. Systolic   function was normal. The estimated ejection fraction was in the   range of 60% to 65%. Doppler parameters are consistent with   abnormal left ventricular relaxation (grade 1 diastolic   dysfunction). - Aortic valve: 26 mm Sapien 3 valve in excellent position with no   perivalvular regurgitation and stable gradients. - Mitral valve: Calcified annulus. Moderately thickened leaflets .   There was mild regurgitation. Valve area by pressure half-time:   2.5 cm^2. - Left atrium: The atrium was moderately dilated. - Atrial septum: No defect or patent foramen ovale was identified.  Echo 09/25/19:IMPRESSIONS    1. Left ventricular ejection fraction, by visual estimation, is 60 to  65%. The left ventricle has normal function. There is mildly increased  left ventricular hypertrophy.  2. Elevated left atrial pressure.  3. Left ventricular diastolic parameters are consistent with Grade I  diastolic dysfunction (impaired relaxation).  4. The left ventricle has no regional wall motion abnormalities.  5. Global  right ventricle has normal systolic function.The right  ventricular size is normal.  6. Left atrial size was mildly dilated.  7. Right atrial size was normal.  8. Severe mitral annular calcification.  9. The mitral valve is normal in structure. Mild mitral valve  regurgitation. No evidence of mitral stenosis.  10. The tricuspid valve is normal in structure.  11. The aortic valve is normal in structure. Aortic valve regurgitation is  not visualized. No evidence of aortic valve sclerosis or stenosis.  12. The pulmonic valve was normal in structure. Pulmonic valve  regurgitation is not visualized.  13. The inferior vena cava is normal in size with greater than 50%  respiratory variability, suggesting right atrial pressure of 3 mmHg.  14. Normal LV systolic function; mild LVH; grade 1 diastolic dysfunction;  s/p TAVR with mean gradient 10 mmHg; AVA 1.8 cm2) and no AI; mild LAE;  mild MR.   Assessment / Plan: 1. Coronary disease with remote stenting of the LAD in 1999. Cardiac catheterization Jan 2013 showed nonobstructive disease. He is asymptomatic. We will continue with Plavix and statin therapy.  2. Severe aortic stenosis.s/p TAVR in August 2016. Excellent result. Follow up Echo in January 2021 looked good. Exam is stable.    3. Sinus node dysfunction with bradycardia. Status post pacemaker implant. Pacemaker followup was satisfactory. Follow up in pacer clinic.  4. Hyperlipidemia. On chronic Crestor therapy. Excellent control. LDL 58.   5. AAA 4.7 cm. Followed by [facility]. Not a candidate for stent grafting. He is a poor candidate for open repair. Last [place] October 2019 was stable.   6. Renal mass. Followed by urology. Patient reports this is stable/smaller.   7. Edema. Due to venous insufficiency. Significantly improved. Will reduce torsemide to 10 mg daily with extra 10 mg as needed. Continue support hose.   I will follow up in 4 months.

## 2020-06-26 ENCOUNTER — Ambulatory Visit: Payer: Medicare Other | Admitting: Cardiology

## 2020-06-26 ENCOUNTER — Encounter: Payer: Self-pay | Admitting: Cardiology

## 2020-06-26 ENCOUNTER — Other Ambulatory Visit: Payer: Self-pay

## 2020-06-26 VITALS — BP 106/60 | HR 88 | Ht 63.0 in | Wt 152.2 lb

## 2020-06-26 DIAGNOSIS — I25118 Atherosclerotic heart disease of native coronary artery with other forms of angina pectoris: Secondary | ICD-10-CM

## 2020-06-26 DIAGNOSIS — Z953 Presence of xenogenic heart valve: Secondary | ICD-10-CM

## 2020-06-26 DIAGNOSIS — R600 Localized edema: Secondary | ICD-10-CM | POA: Diagnosis not present

## 2020-06-26 DIAGNOSIS — I872 Venous insufficiency (chronic) (peripheral): Secondary | ICD-10-CM

## 2020-06-26 DIAGNOSIS — Z950 Presence of cardiac pacemaker: Secondary | ICD-10-CM

## 2020-06-26 NOTE — Patient Instructions (Addendum)
Reduce torsemide to 10 mg daily. You may take extra 10 mg as needed for swelling.   Follow up in 4 months

## 2020-07-14 ENCOUNTER — Ambulatory Visit: Payer: Medicare Other | Attending: Internal Medicine

## 2020-07-14 DIAGNOSIS — Z23 Encounter for immunization: Secondary | ICD-10-CM

## 2020-07-14 NOTE — Progress Notes (Signed)
° °  Covid-19 Vaccination Clinic  Name:  Jordan [name]    MRN: [id number] DOB: [birth year]  07/14/2020  [name] was observed post Covid-19 immunization for 15 minutes without incident. He was provided with Vaccine Information Sheet and instruction to access the V-Safe system.   Jordan [name] was instructed to call 911 with any severe reactions post vaccine:  Difficulty breathing   Swelling of face and throat   A fast heartbeat   A bad rash all over body   Dizziness and weakness

## 2020-07-23 ENCOUNTER — Other Ambulatory Visit: Payer: Self-pay

## 2020-07-23 ENCOUNTER — Ambulatory Visit
Admission: RE | Admit: 2020-07-23 | Discharge: 2020-07-23 | Disposition: A | Discharge status: Home / Self Care | Payer: Medicare Other | Source: Ambulatory Visit | Attending: Radiation Oncology | Admitting: Radiation Oncology

## 2020-07-23 ENCOUNTER — Encounter: Payer: Self-pay | Admitting: Radiation Oncology

## 2020-07-23 DIAGNOSIS — C44622 Squamous cell carcinoma of skin of right upper limb, including shoulder: Secondary | ICD-10-CM

## 2020-07-23 DIAGNOSIS — C4442 Squamous cell carcinoma of skin of scalp and neck: Secondary | ICD-10-CM | POA: Insufficient documentation

## 2020-07-23 DIAGNOSIS — Z79899 Other long term (current) drug therapy: Secondary | ICD-10-CM | POA: Insufficient documentation

## 2020-07-23 DIAGNOSIS — Z923 Personal history of irradiation: Secondary | ICD-10-CM | POA: Diagnosis not present

## 2020-07-23 NOTE — Progress Notes (Signed)
Patient here for a 1 month f/u visit with Dr. Sondra Come. Patient denies pain and reports a good appetite.  BP 134/64 (BP Location: Left Arm, Patient Position: Sitting)   Pulse 93   Temp 97.6 F (36.4 C) (Temporal)   Resp 18   Ht 5\' 3"  (1.6 m)   Wt 152 lb (68.9 kg)   SpO2 97%   BMI 26.93 kg/m   Wt Readings from Last 3 Encounters:  07/23/20 152 lb (68.9 kg)  06/26/20 152 lb 4 oz (69.1 kg)  05/09/20 154 lb 9.6 oz (70.1 kg)

## 2020-07-23 NOTE — Progress Notes (Incomplete)
  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 He did report some yellow drainage from scalp, mild fatigue, some itching/burning in the treatment areas, and some trouble sleeping secondary to a "hot sensation" in his right arm and the top of his head. He denied headaches, poor appetite, and decrease/change in range of motion of right arm. Of note, on 05/31/2020, the patient underwent scalp scab removal using hydrogen peroxide and gentle manipulation. There was minimal bleeding noted that was controlled with gauze compresses and the area was bandaged with a nonadherent dressing. On 06/05/2020, the scalp treatment showed some eschar again, but it was not to the degree prior to removal and was much less thick. The right elbow area showed some erythema and possibly some shrinkage of the lesion. There was no skin breakdown or signs of infection.  Plan: The patient will follow-up with radiation  oncology in one month.  ________________________________________________   Blair Promise, PhD, MD  This document serves as a record of services personally performed by Gery Pray, MD. It was created on his behalf by Clerance Lav, a trained medical scribe. The creation of this record is based on the scribe's personal observations and the provider's statements to them. This document has been checked and approved by the attending provider.

## 2020-07-23 NOTE — Progress Notes (Signed)
Radiation Oncology         (336) [phone] ________________________________  Name: Jordan [name] MRN: [id number]  Date: 07/23/2020  DOB: 07-Oct-1917  Follow-Up Visit Note  CC: Leanna Battles, MD  Allyn Kenner, MD    ICD-10-CM   1. Squamous cell carcinoma of skin of scalp  C44.42   2. Squamous cell cancer of multiple sites of skin of upper arm, right  C44.622     Diagnosis:  Moderately differentiated squamous cell carcinoma of the left scalpwith deep margin involved;squamous cell carcinoma in situ involving the skin of the right elbow  Interval Since Last Radiation: One month and three days  Radiation Treatment Dates: 05/23/2020 through 06/19/2020 Site Technique Total Dose (Gy) Dose per Fx (Gy) Completed Fx Beam Energies  Scalp: HN Complex 50/50 2.5 20/20 6E  Arm, Right: Ext_Rt Complex 50/50 2.5 20/20 6E    Narrative:  The patient returns today for routine follow-up. Since the end of treatment, the patient received his COVID-19 booster vaccination on 10/32/2021. No other significant interval history.  On review of systems, he reports a good appetite. He denies scalp and arm pain.  He reports some soreness along the scalp region.  No reports of bleeding or drainage from either site.                   ALLERGIES:  is allergic to aspirin and nitroglycerin.  Meds: Current Outpatient Medications  Medication Sig Dispense Refill  . acetaminophen (TYLENOL) 325 MG tablet Take 650 mg by mouth every 6 (six) hours as needed for mild pain, moderate pain or fever.     Marland Kitchen amLODipine (NORVASC) 5 MG tablet TAKE 1 TABLET BY MOUTH EVERY DAY. NEED OFFICE VISIT 90 tablet 2  . atorvastatin (LIPITOR) 10 MG tablet Take 1 tablet (10 mg total) by mouth daily. 90 tablet 3  . clopidogrel (PLAVIX) 75 MG tablet Take 1 tablet (75 mg total) by mouth daily. 90 tablet 3  . NEXIUM 24HR 20 MG capsule TAKE 1 CAPSULE BY MOUTH DAILY AT 12 NOON 84 capsule 4  . silodosin (RAPAFLO) 4 MG CAPS capsule Take 4 mg by mouth  every evening.     . torsemide (DEMADEX) 20 MG tablet Take 10 mg by mouth daily.     No current facility-administered medications for this encounter.    Physical Findings: The patient is in no acute distress. Patient is alert and oriented.  height is 5\' 3"  (1.6 m) and weight is 152 lb (68.9 kg). His temporal temperature is 97.6 F (36.4 C). His blood pressure is 134/64 and his pulse is 93. His respiration is 18 and oxygen saturation is 97%. No significant changes. Lungs are clear to auscultation bilaterally. Heart has regular rate and rhythm. No palpable cervical, supraclavicular, or axillary adenopathy. Abdomen soft, non-tender, normal bowel sounds.  The right forearm elbow region is healed well without any residual lesion noted.  Patient has eschar along the scalp region where his previous tumor was located.  Overall this area of eschar has decreased in size.  No signs of bleeding or drainage from the scalp area.  Lab Findings: Lab Results  Component Value Date   WBC 9.6 09/25/2019   HGB 12.8 (L) 09/25/2019   HCT 40.5 09/25/2019   MCV 93.3 09/25/2019   PLT 130 (L) 09/25/2019    Radiographic Findings: No results found.  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  Favorable response to radiation therapy thus far.  Plan: The patient will follow up with radiation oncology in 1 months.    ____________________________________   Blair Promise, PhD, MD  This document serves as a record of services personally performed by Gery Pray, MD. It was created on his behalf by Clerance Lav, a trained medical scribe. The creation of this record is based on the scribe's personal observations and the provider's statements to them. This document has been checked and approved by the attending  provider.

## 2020-08-04 IMAGING — DX DG CHEST 1V PORT
1 series · 1 of 1 positions shown · non-contrast
Comparison: 3672

CLINICAL DATA: Fall

EXAM:
PORTABLE CHEST 1 VIEW

[chest ap]
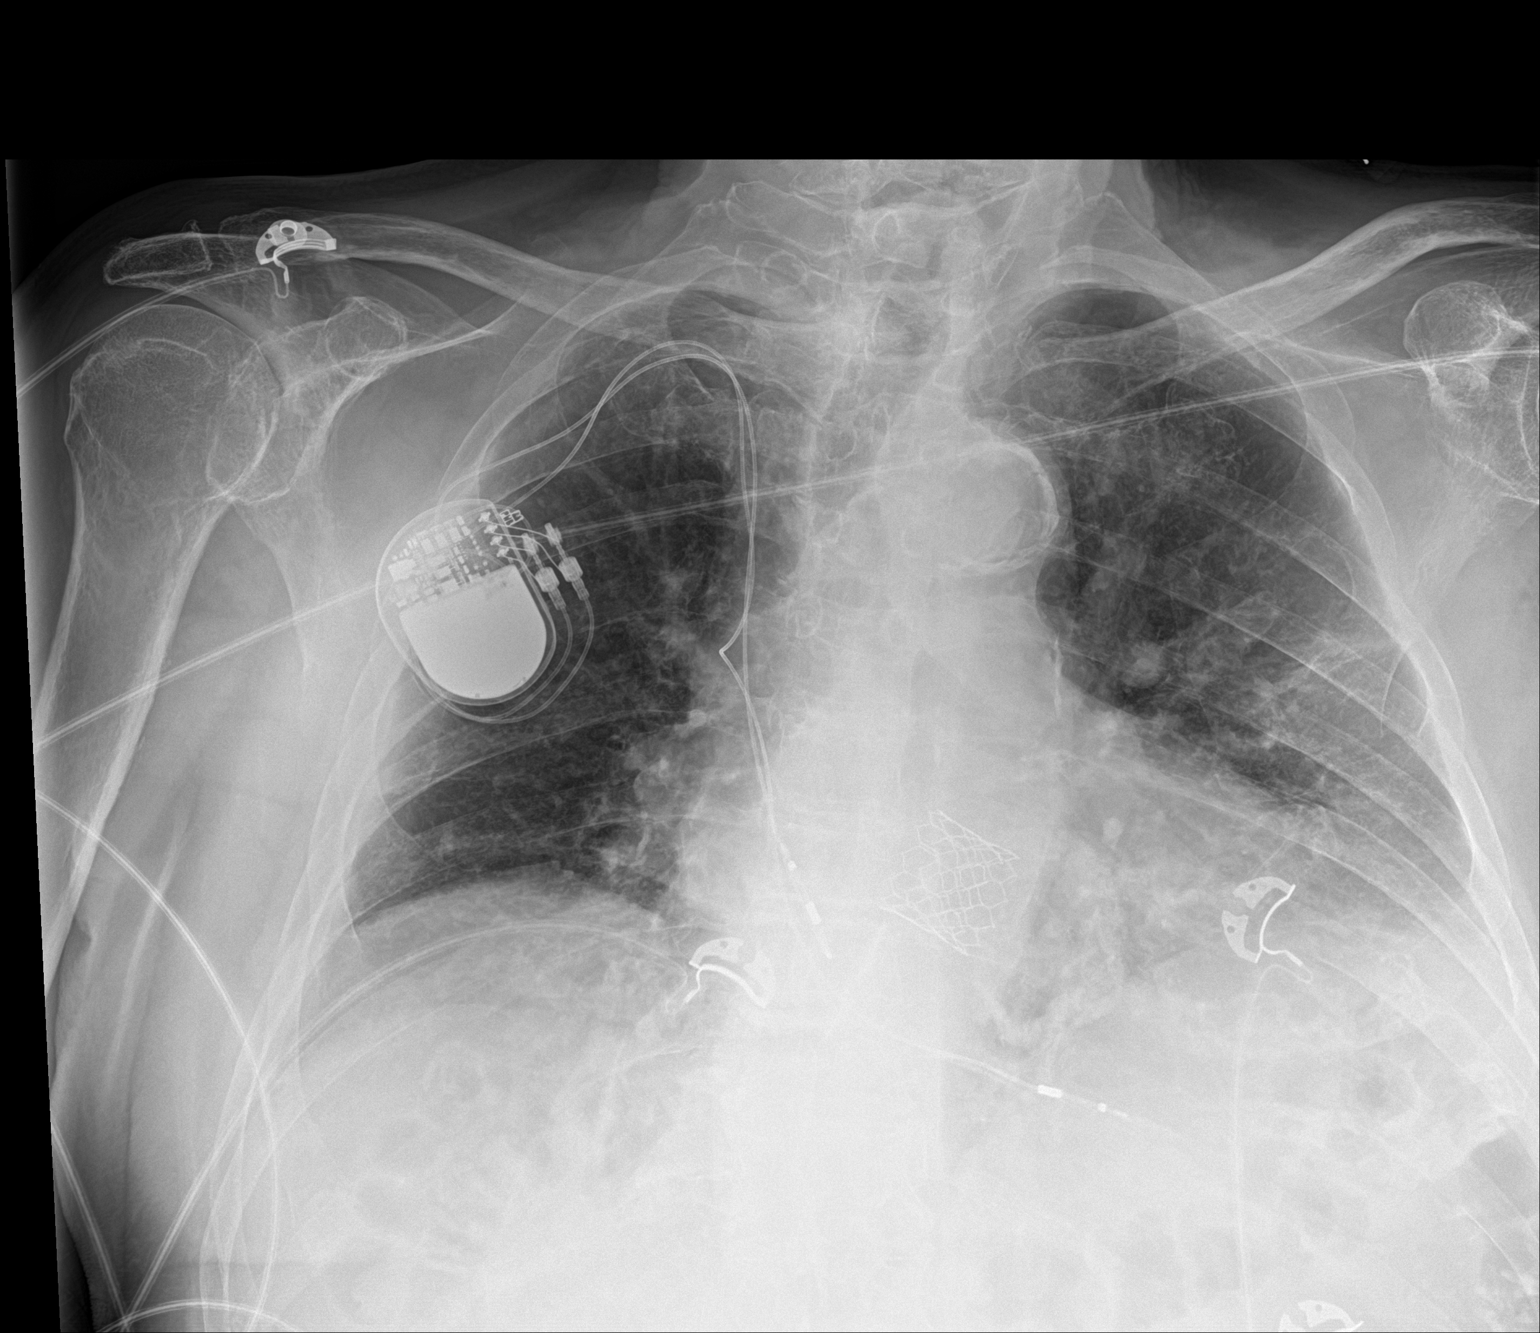

[1 of 1 positions shown; findings below may reference images not displayed]

FINDINGS: Likely chronic background interstitial prominence. Patchy left mid
and lower lung density. Mild pulmonary vascular congestion. Small
left pleural effusion. Similar cardiomediastinal contours.
Endovascular valve replacement is again noted.
IMPRESSION: Mild pulmonary vascular congestion. Patchy left mid and lower lung
atelectasis/consolidation. Small left pleural effusion.

## 2020-08-22 NOTE — Progress Notes (Incomplete)
Mr. Dobie presents today for follow-up after completing radiation to his scalp and right elbow on 06/19/2020  Fatigue: Pain: Skin: Dermatology F/U?: Other issues of note:  *insert 3 weights*  *vitals*

## 2020-08-23 ENCOUNTER — Ambulatory Visit
Admission: RE | Admit: 2020-08-23 | Discharge: 2020-08-23 | Disposition: A | Discharge status: Home / Self Care | Payer: Medicare Other | Source: Ambulatory Visit | Attending: Radiation Oncology | Admitting: Radiation Oncology

## 2020-08-23 ENCOUNTER — Other Ambulatory Visit: Payer: Self-pay

## 2020-08-23 ENCOUNTER — Encounter: Payer: Self-pay | Admitting: Radiation Oncology

## 2020-08-23 VITALS — BP 118/65 | HR 101 | Temp 98.1°F | Resp 20 | Ht 62.0 in | Wt 153.0 lb

## 2020-08-23 DIAGNOSIS — C4442 Squamous cell carcinoma of skin of scalp and neck: Secondary | ICD-10-CM | POA: Diagnosis not present

## 2020-08-23 DIAGNOSIS — C44601 Unspecified malignant neoplasm of skin of unspecified upper limb, including shoulder: Secondary | ICD-10-CM | POA: Diagnosis not present

## 2020-08-23 DIAGNOSIS — Z923 Personal history of irradiation: Secondary | ICD-10-CM | POA: Diagnosis not present

## 2020-08-23 DIAGNOSIS — Z79899 Other long term (current) drug therapy: Secondary | ICD-10-CM | POA: Diagnosis not present

## 2020-08-23 DIAGNOSIS — C44622 Squamous cell carcinoma of skin of right upper limb, including shoulder: Secondary | ICD-10-CM

## 2020-08-23 NOTE — Progress Notes (Signed)
Radiation Oncology         (336) [phone] ________________________________  Name: Jordan [name] MRN: [id number]  Date: 08/23/2020  DOB: 1918/01/23  Follow-Up Visit Note  CC: Leanna Battles, MD  Allyn Kenner, MD    ICD-10-CM   1. 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 No significant interval history since his last visit.  On review of systems, he reports feeling well.  No new medical issues since last follow-up. He denies itching or pain along either treatment area.  According to family members he is cleaning the scalp area on a regular basis and hence less eschar noted.  Patient did celebrate his 89th birthday since his last follow-up..   ALLERGIES:  is allergic to aspirin and nitroglycerin.  Meds: Current Outpatient Medications  Medication Sig Dispense Refill  . acetaminophen (TYLENOL) 325 MG tablet Take 650 mg by mouth every 6 (six) hours as needed for mild pain, moderate pain or fever.     Marland Kitchen amLODipine (NORVASC) 5 MG tablet TAKE 1 TABLET BY MOUTH EVERY DAY. NEED OFFICE VISIT 90 tablet 2  . atorvastatin (LIPITOR) 10 MG tablet Take 1 tablet (10 mg total) by mouth daily. 90 tablet 3  . clopidogrel (PLAVIX) 75 MG tablet Take 1 tablet (75 mg total) by mouth daily. 90 tablet 3  . NEXIUM 24HR 20 MG capsule TAKE 1 CAPSULE BY MOUTH DAILY AT 12 NOON 84 capsule 4  . silodosin (RAPAFLO) 4 MG CAPS capsule Take 4 mg by mouth every evening.     . torsemide (DEMADEX)  20 MG tablet Take 10 mg by mouth daily.     No current facility-administered medications for this encounter.    Physical Findings: The patient is in no acute distress. Patient is alert and oriented.  height is 5\' 2"  (1.575 m) and weight is 153 lb (69.4 kg). His temperature is 98.1 F (36.7 C). His blood pressure is 118/65 and his pulse is 101 (abnormal). His respiration is 20 and oxygen saturation is 96%.  Lungs are clear to auscultation bilaterally. Heart has regular rate and rhythm. No palpable cervical, supraclavicular, or axillary adenopathy. Abdomen soft, non-tender, normal bowel sounds. The right forearm elbow region is healed well without any residual lesion noted.  Patient has eschar along the scalp region where his previous tumor was located. Overall this area of eschar has decreased in size.  No signs of bleeding or drainage from the scalp area.  The area of eschar today measures 2.5 x 2 cm.  Lab Findings: Lab Results  Component Value Date   WBC 9.6 09/25/2019   HGB 12.8 (L) 09/25/2019   HCT 40.5 09/25/2019   MCV 93.3 09/25/2019   PLT 130 (L) 09/25/2019    Radiographic Findings: No results found.  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  He  has noticed new area along his right arm and I recommended he follow-up with Dr. Nevada Crane for evaluation of this issue.  Family members have also noticed some changes along his left face and also recommended that this be evaluated.  Plan: The patient will follow up with radiation oncology in 6 weeks.    ____________________________________   Blair Promise, PhD, MD  This document serves as a record of services personally performed by Gery Pray, MD. It was created on his behalf by Clerance Lav, a trained medical scribe. The  creation of this record is based on the scribe's personal observations and the provider's statements to them. This document has been checked and approved by the attending provider.

## 2020-08-23 NOTE — Progress Notes (Signed)
Jordan [name] presents today for follow-up after completing radiation to his scalp and right elbow on 06/19/2020  Fatigue: NOne Pain:None Skin:Hyperpigmented Dermatology F/U?: Does not have a follow-up appoinment Other issues of note:   Vitals:   08/23/20 1027  BP: 118/65  Pulse: (!) 101  Resp: 20  Temp: 98.1 F (36.7 C)  SpO2: 96%  Weight: 69.4 kg  Height: 5\' 2"  (1.575 m)

## 2020-09-05 ENCOUNTER — Ambulatory Visit (INDEPENDENT_AMBULATORY_CARE_PROVIDER_SITE_OTHER): Payer: Medicare Other

## 2020-09-05 DIAGNOSIS — I441 Atrioventricular block, second degree: Secondary | ICD-10-CM

## 2020-09-09 LAB — CUP PACEART REMOTE DEVICE CHECK
Battery Impedance: 4141 Ohm
Battery Remaining Longevity: 10 mo
Battery Voltage: 2.66 V
Brady Statistic AP VP Percent: 11 %
Brady Statistic AP VS Percent: 0 %
Brady Statistic AS VP Percent: 88 %
Brady Statistic AS VS Percent: 1 %
Date Time Interrogation Session: 20211222102041
Implantable Lead Implant Date: 20040811
Implantable Lead Implant Date: 20040811
Implantable Lead Location: 753859
Implantable Lead Location: 753860
Implantable Lead Model: 4469
Implantable Lead Model: 4470
Implantable Lead Serial Number: 421267
Implantable Lead Serial Number: 436022
Implantable Pulse Generator Implant Date: 20120601
Lead Channel Impedance Value: 478 Ohm
Lead Channel Impedance Value: 478 Ohm
Lead Channel Pacing Threshold Amplitude: 0.75 V
Lead Channel Pacing Threshold Amplitude: 1.125 V
Lead Channel Pacing Threshold Pulse Width: 0.4 ms
Lead Channel Pacing Threshold Pulse Width: 0.4 ms
Lead Channel Setting Pacing Amplitude: 2 V
Lead Channel Setting Pacing Amplitude: 2.5 V
Lead Channel Setting Pacing Pulse Width: 0.4 ms
Lead Channel Setting Sensing Sensitivity: 1 mV

## 2020-09-18 ENCOUNTER — Telehealth: Payer: Self-pay | Admitting: Internal Medicine

## 2020-09-18 NOTE — Telephone Encounter (Signed)
    Pt's son requesting if him and his wife can come in with the pt to his appt. He said him and his wife have some questions, explained visitor policy, he asked to send message to Dr. Ladona Ridgel

## 2020-09-18 NOTE — Telephone Encounter (Signed)
Left message for son advising he and Pt's wife may accompany Pt to upcoming appt

## 2020-09-19 NOTE — Progress Notes (Signed)
Remote pacemaker transmission.   

## 2020-09-21 ENCOUNTER — Encounter: Payer: Self-pay | Admitting: Internal Medicine

## 2020-09-21 ENCOUNTER — Ambulatory Visit: Payer: Medicare Other | Admitting: Internal Medicine

## 2020-09-21 ENCOUNTER — Other Ambulatory Visit: Payer: Self-pay

## 2020-09-21 VITALS — BP 122/72 | HR 91 | Ht 62.0 in | Wt 152.2 lb

## 2020-09-21 DIAGNOSIS — I10 Essential (primary) hypertension: Secondary | ICD-10-CM | POA: Diagnosis not present

## 2020-09-21 DIAGNOSIS — Z950 Presence of cardiac pacemaker: Secondary | ICD-10-CM

## 2020-09-21 DIAGNOSIS — I442 Atrioventricular block, complete: Secondary | ICD-10-CM

## 2020-09-21 NOTE — Progress Notes (Signed)
HPI [name] returns today for followup. He is a pleasant 87 yo man with aortic stenosis, s/p TAVR, CHB, s/p PPM, dyslipidemia and HTN. Despite his advanced age, he has not been in the hospital. He has dyspnea with exertion but is fairly sedentary. He has not had syncope. No anginal symptoms.  Allergies  Allergen Reactions  . Aspirin Other (See Comments)    Stomach bleeds  . Nitroglycerin     Makes patient faint     Current Outpatient Medications  Medication Sig Dispense Refill  . acetaminophen (TYLENOL) 325 MG tablet Take 650 mg by mouth every 6 (six) hours as needed for mild pain, moderate pain or fever.     Marland Kitchen amLODipine (NORVASC) 5 MG tablet TAKE 1 TABLET BY MOUTH EVERY DAY. NEED OFFICE VISIT 90 tablet 2  . atorvastatin (LIPITOR) 10 MG tablet Take 1 tablet (10 mg total) by mouth daily. 90 tablet 3  . clopidogrel (PLAVIX) 75 MG tablet Take 1 tablet (75 mg total) by mouth daily. 90 tablet 3  . NEXIUM 24HR 20 MG capsule TAKE 1 CAPSULE BY MOUTH DAILY AT 12 NOON 84 capsule 4  . silodosin (RAPAFLO) 4 MG CAPS capsule Take 4 mg by mouth every evening.     . torsemide (DEMADEX) 20 MG tablet Take 20 mg by mouth daily.     No current facility-administered medications for this visit.     Past Medical History:  Diagnosis Date  . AAA (abdominal aortic aneurysm) ([place])   . Anemia   . Aortic stenosis   . Arthritis 09/18/11   "in my knees"  . Basal cell carcinoma of skin   . Blood transfusion   . BPH (benign prostatic hyperplasia)   . CAD (coronary artery disease)    Remote stent to LAD in 1999. Last cath in 2004 and he is managed medically  . Colon polyps   . Complication of anesthesia   . Dysrhythmia    paced  . GERD (gastroesophageal reflux disease)   . Hemorrhoid   . HTN (hypertension)   . Hyperlipidemia   . Lung nodule    RLL  . 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 Renal mass, left   . 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   Past Surgical History:  Procedure Laterality Date  . CARDIAC CATHETERIZATION  2004   Managed medically  . CARDIAC CATHETERIZATION    . CARDIAC CATHETERIZATION N/A 04/11/2015   Procedure: Right/Left Heart Cath and Coronary Angiography;  Surgeon: Burnell Blanks, MD;  Location: [facility];  Service: Cardiovascular;  Laterality: N/A;  . CORONARY ANGIOGRAM  09/19/2011   Procedure: CORONARY ANGIOGRAM;  Surgeon: Josue Hector, MD;  Location: [facility] CATH LAB;  Service: Cardiovascular;;  . CORONARY ANGIOPLASTY WITH STENT PLACEMENT  05/09/1998   "1"; LAD  . INSERT / REPLACE / REMOVE PACEMAKER  2004   initial placement; [employer]tronic  . INSERT / REPLACE / REMOVE PACEMAKER  02/14/2011  . NASAL HEMORRHAGE CONTROL  1980's   Clips placed to stop bleeding  . SKIN CANCER EXCISION  09/18/11   "have had a couple hundred of them removed since I was in my 40's"  . TEE WITHOUT CARDIOVERSION N/A 05/15/2015   Procedure: TRANSESOPHAGEAL ECHOCARDIOGRAM (TEE);  Surgeon: Burnell Blanks, MD;  Location: [facility];  Service: Open Heart Surgery;  Laterality: N/A;  . TONSILLECTOMY     "when I was a teenager"  . TRANSCATHETER AORTIC VALVE REPLACEMENT, TRANSFEMORAL N/A 05/15/2015   Procedure: TRANSCATHETER AORTIC VALVE REPLACEMENT, TRANSFEMORAL;  Surgeon: Burnell Blanks, MD;  Location: [facility];  Service: Open Heart Surgery;  Laterality: N/A;     Family History  Problem Relation Age of Onset  . Cancer Father        stomach  . Colon cancer Father   . Heart disease Brother   . Heart disease Brother   . Diabetes Other        granddaughter     Social History   Socioeconomic History  . Marital status: Widowed    Spouse name: Not on file  . Number of children: 2  . Years of education: Not on file  . Highest education level: Not on file   Occupational History  . Occupation: Retired    Comment: Building control surveyor  Tobacco Use  . Smoking status: Former Smoker    Packs/day: 1.00    Years: 42.00    Pack years: 42.00    Types: Cigarettes    Quit date: 08/18/1974    Years since quitting: 46.1  . Smokeless tobacco: Former Systems developer    Types: Secondary school teacher  . Vaping Use: Never used  Substance and Sexual Activity  . Alcohol use: No    Alcohol/week: 0.0 standard drinks  . Drug use: No  . Sexual activity: Not Currently  Other Topics Concern  . Not on file  Social History Narrative  . Not on file   Social Determinants of Health   Financial Resource Strain: Not on file  Food Insecurity: Not on file  Transportation Needs: Not on file  Physical Activity: Not on file  Stress: Not on file  Social Connections: Not on file  Intimate Partner Violence: Not on file     BP 122/72   Pulse 91   Ht 5\' 2"  (1.575 m)   Wt 152 lb 3.2 oz (69 kg)   SpO2 92%   BMI 27.84 kg/m   Physical Exam:  Well appearing elderly man, NAD HEENT: Unremarkable Neck:  No JVD, no thyromegally Lymphatics:  No adenopathy Back:  No CVA tenderness Lungs:  Clear with no wheezes HEART:  Regular rate rhythm, no murmurs, no rubs, no clicks Abd:  soft, positive bowel sounds, no organomegally, no rebound, no guarding Ext:  2 plus pulses, no edema, no cyanosis, no clubbing Skin:  No rashes no nodules Neuro:  CN II through XII intact, motor grossly intact  ECG - NSR with P synchronous ventricular pacing  DEVICE  Normal device function.  See PaceArt for details.   Assess/Plan: 1. CHB - he is asymptomatic, s/p PPM insertion 2. AS - he is doing well, s/p TAVR. 3. HTN - her bp is well controlled. We will follow. 4. PPM -he is still 9 month from ERI.  Carleene Overlie [place] Seaborn,MD

## 2020-09-21 NOTE — Patient Instructions (Signed)
Medication Instructions:  Your physician recommends that you continue on your current medications as directed. Please refer to the Current Medication list given to you today.  Labwork: None ordered.  Testing/Procedures: None ordered.  Follow-Up: Your physician wants you to follow-up in: one year with Cristopher Peru, MD or one of the following Advanced Practice Providers on your designated Care Team:    Chanetta Marshall, NP  Tommye Standard, PA-C  Legrand Como "Jonni Sanger" [place], [place]  Remote monitoring is used to monitor your Pacemaker from home. This monitoring reduces the number of office visits required to check your device to one time per year. It allows [place] to keep an eye on the functioning of your device to ensure it is working properly. You are scheduled for a device check from home on 10/22/2020. You may send your transmission at any time that day. If you have a wireless device, the transmission will be sent automatically. After your physician reviews your transmission, you will receive a postcard with your next transmission date.  Any Other Special Instructions Will Be Listed Below (If Applicable).  If you need a refill on your cardiac medications before your next appointment, please call your pharmacy.

## 2020-10-04 ENCOUNTER — Telehealth: Payer: Self-pay | Admitting: Emergency Medicine

## 2020-10-04 NOTE — Telephone Encounter (Addendum)
Contacted patient by phone and advised patient that the device clinic would be conducting monthly battery checks on patient's device. Last remote 09/05/2020 showing remaining longevity 10 months. Next monthly scheduled battery check on 10/22/2020. Patient also requested that we contact his son who is listed on the [place] at [phone]. Contacted patient's son and left a detailed message on son's voicemail. [facility] number also left [phone] for DPR Arick Mareno should he have any questions or concerns.

## 2020-10-04 NOTE — Telephone Encounter (Signed)
Left detailed message on voicemail for Jordan [name] DPR (per patient request) about monthly battery checks. Device number left if any [phone].Spoke with patient directly but patient states son handles device appointments and information for patient.

## 2020-10-05 ENCOUNTER — Other Ambulatory Visit: Payer: Self-pay | Admitting: Cardiology

## 2020-10-05 ENCOUNTER — Telehealth: Payer: Self-pay | Admitting: Emergency Medicine

## 2020-10-05 NOTE — Telephone Encounter (Addendum)
Patient's son requested manual send dates from My Chart message. My Chart message sent with list of dates as scheduled below. Monthly battery checks listed and scheduled are:   10/22/2020   06/04/2021  11/27/2020   07/11/2021  12/20/2020   08/14/2021  01/21/2021   09/13/2021  02/20/2021    10/02/2021  04/10/2021  05/13/2021

## 2020-10-10 NOTE — Progress Notes (Signed)
Radiation Oncology         [phone] ________________________________  Name: Jordan [name] MRN: [id number]  Date: 10/11/2020  DOB: [birth year]  Follow-Up Visit Note  CC: Leanna Battles, MD  Allyn Kenner, MD    ICD-10-CM   1. Squamous cell carcinoma of skin of scalp  C44.42   2. 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  He reports seeing Dr. Nevada Crane since my last visit. Family member said Dr. Nevada Crane was pleased with results thus far from the radiation therapy  On review of systems, he reports occasional itching in the scalp. He denies bleeding from the scalp or arm area.   ALLERGIES:  is allergic to aspirin and nitroglycerin.  Meds: Current Outpatient Medications  Medication Sig Dispense Refill   acetaminophen (TYLENOL) 325 MG tablet Take 650 mg by mouth every 6 (six) hours as needed for mild pain, moderate pain or fever.      amLODipine (NORVASC) 5 MG tablet TAKE 1 TABLET BY MOUTH EVERY DAY. NEED OFFICE VISIT 90 tablet 2   atorvastatin (LIPITOR) 10 MG tablet Take 1 tablet (10 mg total) by mouth daily. 90 tablet 3   clopidogrel (PLAVIX) 75 MG tablet Take 1 tablet (75 mg total) by mouth daily. 90 tablet 3   NEXIUM 24HR 20 MG capsule TAKE 1 CAPSULE BY MOUTH DAILY AT 12 NOON**PLEASE SCHEDULE AN APPT FOR FUTURE REFILLS 90 capsule 4   silodosin (RAPAFLO) 4 MG CAPS capsule Take 4 mg by mouth every evening.       torsemide (DEMADEX) 20 MG tablet Take 20 mg by mouth daily.     No current facility-administered medications for this encounter.    Physical Findings: The patient is in no acute distress. Patient is alert and oriented.  height is 5\' 2"  (1.575 m) and weight is 152 lb 4 oz (69.1 kg). His temporal temperature is 97.5 F (36.4 C) (abnormal). His blood pressure is 125/74 and his pulse is 88. His respiration is 20 and oxygen saturation is 96%.  Lungs are clear to auscultation bilaterally. Heart has regular rate and rhythm. No palpable cervical, supraclavicular, or axillary adenopathy. Abdomen soft, non-tender, normal bowel sounds. The treated area along the scalp shows some continued eschar which is decreased in size compared to last exam. No obvious signs of malignancy. The right forearm area is healed well without any palpable or visible signs of recurrence.  Lab Findings: Lab Results  Component Value Date   WBC 9.6 09/25/2019   HGB 12.8 (L) 09/25/2019   HCT 40.5 09/25/2019   MCV 93.3 09/25/2019   PLT 130 (L) 09/25/2019    Radiographic Findings: No results found.  Impression: Moderately differentiated squamous cell carcinoma of the left scalpwith deep margin involved;squamous cell carcinoma in situ involving the skin of the right elbow  Patient is doing well 3 months out from his treatment with no obvious tumor present at this time. We will have to  wait until the eschar falls off along the scalp to make a determination in this region however.  Plan: The patient will follow up with radiation oncology in 2 months. He will see Dr. Nevada Crane prior to this visit.  Total time spent in this encounter was 15 minutes which included reviewing the patient's most recent interval history, physical examination, and documentation. ____________________________________   Blair Promise, PhD, MD  This document serves as a record of services personally performed by Gery Pray, MD. It was created on his  behalf by Clerance Lav, a trained medical scribe. The creation of this record is based on the scribe's personal observations and the provider's statements to them. This document has been checked and approved by the attending provider.

## 2020-10-11 ENCOUNTER — Ambulatory Visit
Admission: RE | Admit: 2020-10-11 | Discharge: 2020-10-11 | Disposition: A | Discharge status: Home / Self Care | Payer: Medicare Other | Source: Ambulatory Visit | Attending: Radiation Oncology | Admitting: Radiation Oncology

## 2020-10-11 ENCOUNTER — Other Ambulatory Visit: Payer: Self-pay

## 2020-10-11 ENCOUNTER — Encounter: Payer: Self-pay | Admitting: Radiation Oncology

## 2020-10-11 VITALS — BP 125/74 | HR 88 | Temp 97.5°F | Resp 20 | Ht 62.0 in | Wt 152.2 lb

## 2020-10-11 DIAGNOSIS — C44622 Squamous cell carcinoma of skin of right upper limb, including shoulder: Secondary | ICD-10-CM | POA: Diagnosis not present

## 2020-10-11 DIAGNOSIS — Z923 Personal history of irradiation: Secondary | ICD-10-CM | POA: Insufficient documentation

## 2020-10-11 DIAGNOSIS — Z79899 Other long term (current) drug therapy: Secondary | ICD-10-CM | POA: Insufficient documentation

## 2020-10-11 DIAGNOSIS — C4442 Squamous cell carcinoma of skin of scalp and neck: Secondary | ICD-10-CM | POA: Diagnosis not present

## 2020-10-11 NOTE — Progress Notes (Signed)
Patient is here today for follow up to radiation completed October 2021 to scalp and right elbow.  Patient reports one fall since last visit/no injuries.  Patient reports no limitations to right arm movement.  Patient reports some mild chest discomfort may be related to fall and he uses Tylenol for the pain.  PCP evaluated patient after fall.  Scalp still has escar present.  Patient states that occasionally some of the scab may fall off, especially during bathing.  Denies having any blood to area.  Some mild itching to the scalp.  Vitals:   10/11/20 1022  BP: 125/74  Pulse: 88  Resp: 20  Temp: (!) 97.5 F (36.4 C)  TempSrc: Temporal  SpO2: 96%  Weight: 152 lb 4 oz (69.1 kg)  Height: 5\' 2"  (1.575 m)

## 2020-10-22 ENCOUNTER — Ambulatory Visit (INDEPENDENT_AMBULATORY_CARE_PROVIDER_SITE_OTHER): Payer: Medicare Other

## 2020-10-22 DIAGNOSIS — I442 Atrioventricular block, complete: Secondary | ICD-10-CM

## 2020-10-24 LAB — CUP PACEART REMOTE DEVICE CHECK
Battery Impedance: 4995 Ohm
Battery Remaining Longevity: 6 mo
Battery Voltage: 2.63 V
Brady Statistic AP VP Percent: 9 %
Brady Statistic AP VS Percent: 0 %
Brady Statistic AS VP Percent: 91 %
Brady Statistic AS VS Percent: 0 %
Date Time Interrogation Session: 20220207125115
Implantable Lead Implant Date: 20040811
Implantable Lead Implant Date: 20040811
Implantable Lead Location: 753859
Implantable Lead Location: 753860
Implantable Lead Model: 4469
Implantable Lead Model: 4470
Implantable Lead Serial Number: 421267
Implantable Lead Serial Number: 436022
Implantable Pulse Generator Implant Date: 20120601
Lead Channel Impedance Value: 484 Ohm
Lead Channel Impedance Value: 502 Ohm
Lead Channel Pacing Threshold Amplitude: 0.75 V
Lead Channel Pacing Threshold Amplitude: 1.125 V
Lead Channel Pacing Threshold Pulse Width: 0.4 ms
Lead Channel Pacing Threshold Pulse Width: 0.4 ms
Lead Channel Setting Pacing Amplitude: 2 V
Lead Channel Setting Pacing Amplitude: 2.5 V
Lead Channel Setting Pacing Pulse Width: 0.4 ms
Lead Channel Setting Sensing Sensitivity: 1 mV

## 2020-10-25 ENCOUNTER — Telehealth: Payer: Self-pay | Admitting: Emergency Medicine

## 2020-10-25 NOTE — Telephone Encounter (Signed)
Patient scheduled for monthly battery checks. Last remote on 10/22/2020 showed 6 months until ERI.

## 2020-10-29 NOTE — Progress Notes (Signed)
Remote pacemaker transmission.   

## 2020-10-29 NOTE — Addendum Note (Signed)
Addended by: Cheri Kearns A on: 10/29/2020 09:13 AM   Modules accepted: Level of Service

## 2020-11-01 NOTE — Progress Notes (Unsigned)
[name] Date of Birth: 1918-05-01   History of Present Illness: Tyrie is seen today for follow up edema.   He has a history of coronary disease with remote stenting of the LAD in 1999. Cardiac catheterization January 2013 showed nonobstructive disease. He has a history of bradycardia requiring pacemaker implant. He has a history of severe aortic stenosis. He underwent TAVR on 05/15/15.  Follow up Echo in September 2017 looked good.   He has a 4.6 cm abdominal aortic aneurysm followed  by [facility]- last checked in October 2019. Stable at 4.7 cm.  Not a candidate for stent grafting.   He was admitted in January 2021 with  a fall. Had post concussion confusion. Son thinks he may have fallen when he got up to urinate at night without the light on. Was orthostatic. Troponin elevated 2562> 2467. Seen by Dr Lovena Le. No clear evidence of ACS by history. Echo was OK and pacemaker checked out. CT of head without acute change. Felt he had some vagal response related to abdominal pain.  He was seen this spring with increased LE edema. Doppler was negative for DVT.  Was placed on lasix 20 mg daily and later switched to torsemide 10 mg daily. This was increased to 20 mg daily on his last visit. Since then his swelling has resolved. He is wearing compression hose still.  He has completed RT for scalp cancer.   Seen by Dr Lovena Le in January. Last pacer check in February indicates he is about 6 months from ERI.  He is seen with his son today. No dyspnea or palpitations. Had some chest pain after a fall that was felt to be more musculoskeletal. Sleeps in a recliner. Sometimes when he gets up in the morning his feet are cold. No claudication.    Current Outpatient Medications on File Prior to Visit  Medication Sig Dispense Refill  . acetaminophen (TYLENOL) 325 MG tablet Take 650 mg by mouth every 6 (six) hours as needed for mild pain, moderate pain or fever.     Marland Kitchen amLODipine (NORVASC) 5 MG tablet TAKE  1 TABLET BY MOUTH EVERY DAY. NEED OFFICE VISIT 90 tablet 2  . atorvastatin (LIPITOR) 10 MG tablet Take 1 tablet (10 mg total) by mouth daily. 90 tablet 3  . clopidogrel (PLAVIX) 75 MG tablet Take 1 tablet (75 mg total) by mouth daily. 90 tablet 3  . NEXIUM 24HR 20 MG capsule TAKE 1 CAPSULE BY MOUTH DAILY AT 12 NOON**PLEASE SCHEDULE AN APPT FOR FUTURE REFILLS 90 capsule 4  . silodosin (RAPAFLO) 4 MG CAPS capsule Take 4 mg by mouth every evening.     . torsemide (DEMADEX) 20 MG tablet Take 20 mg by mouth daily.     No current facility-administered medications on file prior to visit.    Allergies  Allergen Reactions  . Aspirin Other (See Comments)    Stomach bleeds  . Nitroglycerin     Makes patient faint    Past Medical History:  Diagnosis Date  . AAA (abdominal aortic aneurysm) ([place])   . Anemia   . Aortic stenosis   . Arthritis 09/18/11   "in my knees"  . Basal cell carcinoma of skin   . Blood transfusion   . BPH (benign prostatic hyperplasia)   . CAD (coronary artery disease)    Remote stent to LAD in 1999. Last cath in 2004 and he is managed medically  . Colon polyps   . Complication of anesthesia   .  Dysrhythmia    paced  . GERD (gastroesophageal reflux disease)   . Hemorrhoid   . HTN (hypertension)   . Hyperlipidemia   . Lung nodule    RLL  . 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 Renal mass, left   . 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 CARDIAC CATHETERIZATION  2004   Managed medically  . CARDIAC CATHETERIZATION    . CARDIAC CATHETERIZATION N/A 04/11/2015   Procedure: Right/Left Heart Cath and Coronary Angiography;  Surgeon: Burnell Blanks, MD;  Location: [facility];  Service: Cardiovascular;  Laterality: N/A;  . CORONARY ANGIOGRAM  09/19/2011   Procedure: CORONARY  ANGIOGRAM;  Surgeon: Josue Hector, MD;  Location: [facility] CATH LAB;  Service: Cardiovascular;;  . CORONARY ANGIOPLASTY WITH STENT PLACEMENT  05/09/1998   "1"; LAD  . INSERT / REPLACE / REMOVE PACEMAKER  2004   initial placement; [employer]tronic  . INSERT / REPLACE / REMOVE PACEMAKER  02/14/2011  . NASAL HEMORRHAGE CONTROL  1980's   Clips placed to stop bleeding  . SKIN CANCER EXCISION  09/18/11   "have had a couple hundred of them removed since I was in my 40's"  . TEE WITHOUT CARDIOVERSION N/A 05/15/2015   Procedure: TRANSESOPHAGEAL ECHOCARDIOGRAM (TEE);  Surgeon: Burnell Blanks, MD;  Location: [facility];  Service: Open Heart Surgery;  Laterality: N/A;  . TONSILLECTOMY     "when I was a teenager"  . TRANSCATHETER AORTIC VALVE REPLACEMENT, TRANSFEMORAL N/A 05/15/2015   Procedure: TRANSCATHETER AORTIC VALVE REPLACEMENT, TRANSFEMORAL;  Surgeon: Burnell Blanks, MD;  Location: [facility];  Service: Open Heart Surgery;  Laterality: N/A;    Social History   Tobacco Use  Smoking Status Former Smoker  . Packs/day: 1.00  . Years: 42.00  . Pack years: 42.00  . Types: Cigarettes  . Quit date: 08/18/1974  . Years since quitting: 46.2  Smokeless Tobacco Former Systems developer  . Types: Chew    Social History   Substance and Sexual Activity  Alcohol Use No  . Alcohol/week: 0.0 standard drinks    Family History  Problem Relation Age of Onset  . Cancer Father        stomach  . Colon cancer Father   . Heart disease Brother   . Heart disease Brother   . Diabetes Other        granddaughter    Review of Systems: As noted in history of present illness. All other systems were reviewed and are negative.  Physical Exam: BP 120/62   Pulse (!) 104   Ht 5\' 3"  (1.6 m)   Wt 153 lb 3.2 oz (69.5 kg)   SpO2 95%   BMI 27.14 kg/m  GENERAL:  Well appearing elderly WM in NAD HEENT:  PERRL, EOMI, sclera are clear. Oropharynx is clear. NECK:  No jugular venous distention, carotid upstroke brisk and symmetric, no  bruits, no thyromegaly or adenopathy LUNGS:  clear CHEST:  Unremarkable HEART:  RRR,  PMI not displaced or sustained,S1 and S2 within normal limits, no S3, no S4: no clicks, no rubs, gr 2/6 systolic murmur. ABD:  Soft, nontender. BS +, no masses or bruits. No hepatomegaly, no splenomegaly EXT:  Pedal pulses are palpable, no LE edema. SKIN:  Warm and dry.  No rashes NEURO:  Alert and oriented  x 3. Cranial nerves II through XII intact. PSYCH:  Cognitively intact    LABORATORY DATA: Lab Results  Component Value Date   WBC 9.6 09/25/2019   HGB 12.8 (L) 09/25/2019   HCT 40.5 09/25/2019   PLT 130 (L) 09/25/2019   GLUCOSE 95 04/09/2020   CHOL 104 09/19/2011   TRIG 66 09/19/2011   HDL 38 (L) 09/19/2011   LDLCALC 53 09/19/2011   ALT 20 09/24/2019   AST 41 09/24/2019   NA 139 04/09/2020   K 4.5 04/09/2020   CL 97 04/09/2020   CREATININE 1.46 (H) 04/09/2020   BUN 24 04/09/2020   CO2 30 (H) 04/09/2020   TSH 0.797 09/18/2011   INR 1.1 09/24/2019   HGBA1C 5.5 05/11/2015   Labs dated 07/05/18: Normal CBC and CMET. Cholesterol 120, triglycerides 131, HDL 36, LDL 58.   Echo: 05/28/16:  Study Conclusions  - Left ventricle: The cavity size was mildly dilated. Wall   thickness was increased in a pattern of moderate LVH. Systolic   function was normal. The estimated ejection fraction was in the   range of 60% to 65%. Doppler parameters are consistent with   abnormal left ventricular relaxation (grade 1 diastolic   dysfunction). - Aortic valve: 26 mm Sapien 3 valve in excellent position with no   perivalvular regurgitation and stable gradients. - Mitral valve: Calcified annulus. Moderately thickened leaflets .   There was mild regurgitation. Valve area by pressure half-time:   2.5 cm^2. - Left atrium: The atrium was moderately dilated. - Atrial septum: No defect or patent foramen ovale was identified.  Echo 09/25/19:IMPRESSIONS    1. Left ventricular ejection fraction, by visual  estimation, is 60 to  65%. The left ventricle has normal function. There is mildly increased  left ventricular hypertrophy.  2. Elevated left atrial pressure.  3. Left ventricular diastolic parameters are consistent with Grade I  diastolic dysfunction (impaired relaxation).  4. The left ventricle has no regional wall motion abnormalities.  5. Global right ventricle has normal systolic function.The right  ventricular size is normal.  6. Left atrial size was mildly dilated.  7. Right atrial size was normal.  8. Severe mitral annular calcification.  9. The mitral valve is normal in structure. Mild mitral valve  regurgitation. No evidence of mitral stenosis.  10. The tricuspid valve is normal in structure.  11. The aortic valve is normal in structure. Aortic valve regurgitation is  not visualized. No evidence of aortic valve sclerosis or stenosis.  12. The pulmonic valve was normal in structure. Pulmonic valve  regurgitation is not visualized.  13. The inferior vena cava is normal in size with greater than 50%  respiratory variability, suggesting right atrial pressure of 3 mmHg.  14. Normal LV systolic function; mild LVH; grade 1 diastolic dysfunction;  s/p TAVR with mean gradient 10 mmHg; AVA 1.8 cm2) and no AI; mild LAE;  mild MR.   Assessment / Plan: 1. Coronary disease with remote stenting of the LAD in 1999. Cardiac catheterization Jan 2013 showed nonobstructive disease. He is asymptomatic. We will continue with Plavix and statin therapy.  2. Severe aortic stenosis.s/p TAVR in August 2016. Excellent result. Follow up Echo in January 2021 looked good. Exam is stable.    3. Sinus node dysfunction with bradycardia. Status post pacemaker implant. Pacemaker followup is satisfactory. May need generator change out within the next year  4. Hyperlipidemia. On chronic Crestor therapy. Check fasting lab today.   5. AAA 4.7 cm. Followed  by [facility]. Not a candidate for stent grafting. He is  a poor candidate for open repair. Last [place] October 2019 was stable. Since he is not a candidate for repair we are not really following at this point.   6. Renal mass. Followed by urology. Patient reports this is stable/smaller.   7. Edema. Due to venous insufficiency. Resolved completely.   I will follow up in 6 months.

## 2020-11-02 ENCOUNTER — Other Ambulatory Visit: Payer: Self-pay

## 2020-11-02 ENCOUNTER — Encounter: Payer: Self-pay | Admitting: Cardiology

## 2020-11-02 ENCOUNTER — Ambulatory Visit: Payer: Medicare Other | Admitting: Cardiology

## 2020-11-02 VITALS — BP 120/62 | HR 104 | Ht 63.0 in | Wt 153.2 lb

## 2020-11-02 DIAGNOSIS — Z953 Presence of xenogenic heart valve: Secondary | ICD-10-CM

## 2020-11-02 DIAGNOSIS — I442 Atrioventricular block, complete: Secondary | ICD-10-CM

## 2020-11-02 DIAGNOSIS — I25118 Atherosclerotic heart disease of native coronary artery with other forms of angina pectoris: Secondary | ICD-10-CM

## 2020-11-02 DIAGNOSIS — I2510 Atherosclerotic heart disease of native coronary artery without angina pectoris: Secondary | ICD-10-CM

## 2020-11-02 DIAGNOSIS — E785 Hyperlipidemia, unspecified: Secondary | ICD-10-CM

## 2020-11-02 DIAGNOSIS — Z950 Presence of cardiac pacemaker: Secondary | ICD-10-CM

## 2020-11-02 DIAGNOSIS — I10 Essential (primary) hypertension: Secondary | ICD-10-CM

## 2020-11-02 DIAGNOSIS — R600 Localized edema: Secondary | ICD-10-CM

## 2020-11-03 LAB — CBC WITH DIFFERENTIAL/PLATELET
Basophils Absolute: 0 10*3/uL (ref 0.0–0.2)
Basos: 1 %
EOS (ABSOLUTE): 0.3 10*3/uL (ref 0.0–0.4)
Eos: 4 %
Hematocrit: 34 % — ABNORMAL LOW (ref 37.5–51.0)
Hemoglobin: 10.8 g/dL — ABNORMAL LOW (ref 13.0–17.7)
Immature Grans (Abs): 0 10*3/uL (ref 0.0–0.1)
Immature Granulocytes: 0 %
Lymphocytes Absolute: 3 10*3/uL (ref 0.7–3.1)
Lymphs: 39 %
MCH: 28.4 pg (ref 26.6–33.0)
MCHC: 31.8 g/dL (ref 31.5–35.7)
MCV: 90 fL (ref 79–97)
Monocytes Absolute: 0.7 10*3/uL (ref 0.1–0.9)
Monocytes: 9 %
Neutrophils Absolute: 3.7 10*3/uL (ref 1.4–7.0)
Neutrophils: 47 %
Platelets: 208 10*3/uL (ref 150–450)
RBC: 3.8 x10E6/uL — ABNORMAL LOW (ref 4.14–5.80)
RDW: 12.5 % (ref 11.6–15.4)
WBC: 7.7 10*3/uL (ref 3.4–10.8)

## 2020-11-03 LAB — HEPATIC FUNCTION PANEL
ALT: 11 IU/L (ref 0–44)
AST: 16 IU/L (ref 0–40)
Albumin: 3.7 g/dL (ref 3.5–4.6)
Alkaline Phosphatase: 73 IU/L (ref 44–121)
Bilirubin Total: 0.3 mg/dL (ref 0.0–1.2)
Bilirubin, Direct: 0.12 mg/dL (ref 0.00–0.40)
Total Protein: 7.1 g/dL (ref 6.0–8.5)

## 2020-11-03 LAB — LIPID PANEL
Chol/HDL Ratio: 3.9 ratio (ref 0.0–5.0)
Cholesterol, Total: 140 mg/dL (ref 100–199)
HDL: 36 mg/dL — ABNORMAL LOW (ref >39)
LDL Chol Calc (NIH): 69 mg/dL (ref 0–99)
Triglycerides: 211 mg/dL — ABNORMAL HIGH (ref 0–149)
VLDL Cholesterol Cal: 35 mg/dL (ref 5–40)

## 2020-11-03 LAB — BASIC METABOLIC PANEL
BUN/Creatinine Ratio: 17 (ref 10–24)
BUN: 23 mg/dL (ref 10–36)
CO2: 27 mmol/L (ref 20–29)
Calcium: 9 mg/dL (ref 8.6–10.2)
Chloride: 102 mmol/L (ref 96–106)
Creatinine, Ser: 1.36 mg/dL — ABNORMAL HIGH (ref 0.76–1.27)
GFR calc Af Amer: 49 mL/min/{1.73_m2} — ABNORMAL LOW (ref >59)
GFR calc non Af Amer: 42 mL/min/{1.73_m2} — ABNORMAL LOW (ref >59)
Glucose: 101 mg/dL — ABNORMAL HIGH (ref 65–99)
Potassium: 4.8 mmol/L (ref 3.5–5.2)
Sodium: 144 mmol/L (ref 134–144)

## 2020-11-27 ENCOUNTER — Ambulatory Visit (INDEPENDENT_AMBULATORY_CARE_PROVIDER_SITE_OTHER): Payer: Medicare Other

## 2020-11-27 DIAGNOSIS — I441 Atrioventricular block, second degree: Secondary | ICD-10-CM

## 2020-11-27 LAB — CUP PACEART REMOTE DEVICE CHECK
Battery Impedance: 5595 Ohm
Battery Remaining Longevity: 3 mo
Battery Voltage: 2.61 V
Brady Statistic AP VP Percent: 8 %
Brady Statistic AP VS Percent: 0 %
Brady Statistic AS VP Percent: 91 %
Brady Statistic AS VS Percent: 0 %
Date Time Interrogation Session: 20220315110406
Implantable Lead Implant Date: 20040811
Implantable Lead Implant Date: 20040811
Implantable Lead Location: 753859
Implantable Lead Location: 753860
Implantable Lead Model: 4469
Implantable Lead Model: 4470
Implantable Lead Serial Number: 421267
Implantable Lead Serial Number: 436022
Implantable Pulse Generator Implant Date: 20120601
Lead Channel Impedance Value: 463 Ohm
Lead Channel Impedance Value: 508 Ohm
Lead Channel Pacing Threshold Amplitude: 0.75 V
Lead Channel Pacing Threshold Amplitude: 1 V
Lead Channel Pacing Threshold Pulse Width: 0.4 ms
Lead Channel Pacing Threshold Pulse Width: 0.4 ms
Lead Channel Setting Pacing Amplitude: 2 V
Lead Channel Setting Pacing Amplitude: 2.5 V
Lead Channel Setting Pacing Pulse Width: 0.4 ms
Lead Channel Setting Sensing Sensitivity: 1 mV

## 2020-12-06 NOTE — Progress Notes (Signed)
Remote pacemaker transmission.   

## 2020-12-10 ENCOUNTER — Ambulatory Visit
Admission: RE | Admit: 2020-12-10 | Discharge: 2020-12-10 | Disposition: A | Discharge status: Home / Self Care | Payer: Medicare Other | Source: Ambulatory Visit | Attending: Radiation Oncology | Admitting: Radiation Oncology

## 2020-12-10 ENCOUNTER — Encounter: Payer: Self-pay | Admitting: Radiation Oncology

## 2020-12-10 ENCOUNTER — Other Ambulatory Visit: Payer: Self-pay

## 2020-12-10 VITALS — BP 132/70 | HR 91 | Temp 97.6°F | Resp 24 | Ht 63.0 in | Wt 154.2 lb

## 2020-12-10 DIAGNOSIS — Z923 Personal history of irradiation: Secondary | ICD-10-CM | POA: Diagnosis not present

## 2020-12-10 DIAGNOSIS — Z79899 Other long term (current) drug therapy: Secondary | ICD-10-CM | POA: Insufficient documentation

## 2020-12-10 DIAGNOSIS — Z85828 Personal history of other malignant neoplasm of skin: Secondary | ICD-10-CM | POA: Insufficient documentation

## 2020-12-10 DIAGNOSIS — C4442 Squamous cell carcinoma of skin of scalp and neck: Secondary | ICD-10-CM

## 2020-12-10 DIAGNOSIS — C44622 Squamous cell carcinoma of skin of right upper limb, including shoulder: Secondary | ICD-10-CM

## 2020-12-10 NOTE — Progress Notes (Signed)
Radiation Oncology         [phone] ________________________________  Name: LABRON BLOODGOOD MRN: [id number]  Date: 12/10/2020  DOB: 08-11-1918  Follow-Up Visit Note  CC: Leanna Battles, MD  Allyn Kenner, MD    ICD-10-CM   1. Squamous cell carcinoma of skin of scalp  C44.42   2. 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 No significant interval history since his last visit.  Patient did meet with Dr. Nevada Crane and recently had removal of the new skin cancer along the left cheek area as well as the left upper chest region.  On review of systems, he reports some itching along the scalp lesion. He denies any bleeding from this area.  He denies any itching or discomfort along his right forearm..  ALLERGIES:  is allergic to aspirin and nitroglycerin.  Meds: Current Outpatient Medications  Medication Sig Dispense Refill  . acetaminophen (TYLENOL) 325 MG tablet Take 650 mg by mouth every 6 (six) hours as needed for mild pain, moderate pain or fever.     Marland Kitchen amLODipine (NORVASC) 5 MG tablet TAKE 1 TABLET BY MOUTH EVERY DAY. NEED OFFICE VISIT 90 tablet 2  . atorvastatin (LIPITOR) 10 MG tablet Take 1 tablet (10 mg total) by mouth daily. 90 tablet 3  . clopidogrel (PLAVIX) 75 MG tablet Take 1 tablet (75 mg total) by mouth daily. 90 tablet 3  . silodosin (RAPAFLO) 4 MG CAPS capsule Take 4 mg by mouth every evening.     . torsemide  (DEMADEX) 20 MG tablet Take 20 mg by mouth daily.    Marland Kitchen NEXIUM 24HR 20 MG capsule TAKE 1 CAPSULE BY MOUTH DAILY AT 12 NOON**PLEASE SCHEDULE AN APPT FOR FUTURE REFILLS 90 capsule 4   No current facility-administered medications for this encounter.    Physical Findings: The patient is in no acute distress. Patient is alert and oriented.  height is 5\' 3"  (1.6 m) and weight is 154 lb 3.2 oz (69.9 kg). His temperature is 97.6 F (36.4 C). His blood pressure is 132/70 and his pulse is 91. His respiration is 24 (abnormal) and oxygen saturation is 97%.  Lungs are clear to auscultation bilaterally. Heart has regular rate and rhythm. No palpable cervical, supraclavicular, or axillary adenopathy. Abdomen soft, non-tender, normal bowel sounds. The treated area along the scalp shows an area of eschar which measures approximately 3 x 3 cm.  No oozing or drainage from this area.  Examination of the right elbow region where he was treated shows no evidence of recurrence along this area.  There is a small nodule 2mm outside the treatment area that have shown to the son and recommend pay close attention to this area and to point out to Dr. Nevada Crane on his next visit.  Lab Findings: Lab Results  Component Value Date   WBC 7.7 11/02/2020   HGB 10.8 (L) 11/02/2020   HCT 34.0 (L) 11/02/2020   MCV 90  11/02/2020   PLT 208 11/02/2020    Radiographic Findings: CUP PACEART REMOTE DEVICE CHECK  Result Date: 11/27/2020 Scheduled monthly remote reviewed. Normal device function. Estimated longevity 3 months. 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  Eschar remains on the scalp region but no larger than on previous exams.  Plan: The patient will follow up with radiation oncology in as needed basis in  light of his close follow-up with Dr. Dayton Martes.  The patient reports that he sees Dr. Nevada Crane approximately every 6 weeks.     ____________________________________   Blair Promise, PhD, MD  This document serves as a record of services personally performed by Gery Pray, MD. It was created on his behalf by Clerance Lav, a trained medical scribe. The creation of this record is based on the scribe's personal observations and the provider's statements to them. This document has been checked and approved by the attending provider.

## 2020-12-10 NOTE — Progress Notes (Signed)
Patient in for follow-up for post radiation to scalp and RUE, completed on 06/19/20.  Patient denies any bleeding to scalp, patient reports having minimal brown drainage after showering. Patient not cleaning area to scalp. Denies headaches, or visual changes or speech changes.   Patient denies any swelling to RUE, patient has good range of motion to RUE, No skin changes noted.   Vitals:   12/10/20 1100  BP: 132/70  Pulse: 91  Resp: (!) 24  Temp: 97.6 F (36.4 C)  SpO2: 97%  Weight: 154 lb 3.2 oz (69.9 kg)  Height: 5\' 3"  (1.6 m)

## 2020-12-20 ENCOUNTER — Ambulatory Visit (INDEPENDENT_AMBULATORY_CARE_PROVIDER_SITE_OTHER): Payer: Medicare Other

## 2020-12-20 DIAGNOSIS — I442 Atrioventricular block, complete: Secondary | ICD-10-CM

## 2020-12-20 LAB — CUP PACEART REMOTE DEVICE CHECK
Battery Impedance: 6285 Ohm
Battery Remaining Longevity: 1 mo — CL
Battery Voltage: 2.6 V
Brady Statistic AP VP Percent: 8 %
Brady Statistic AP VS Percent: 0 %
Brady Statistic AS VP Percent: 92 %
Brady Statistic AS VS Percent: 0 %
Date Time Interrogation Session: 20220407114238
Implantable Lead Implant Date: 20040811
Implantable Lead Implant Date: 20040811
Implantable Lead Location: 753859
Implantable Lead Location: 753860
Implantable Lead Model: 4469
Implantable Lead Model: 4470
Implantable Lead Serial Number: 421267
Implantable Lead Serial Number: 436022
Implantable Pulse Generator Implant Date: 20120601
Lead Channel Impedance Value: 472 Ohm
Lead Channel Impedance Value: 523 Ohm
Lead Channel Pacing Threshold Amplitude: 0.875 V
Lead Channel Pacing Threshold Amplitude: 1.125 V
Lead Channel Pacing Threshold Pulse Width: 0.4 ms
Lead Channel Pacing Threshold Pulse Width: 0.4 ms
Lead Channel Setting Pacing Amplitude: 2 V
Lead Channel Setting Pacing Amplitude: 2.5 V
Lead Channel Setting Pacing Pulse Width: 0.4 ms
Lead Channel Setting Sensing Sensitivity: 1 mV

## 2021-01-02 NOTE — Progress Notes (Signed)
Remote pacemaker transmission.   

## 2021-01-15 ENCOUNTER — Telehealth: Payer: Self-pay | Admitting: Emergency Medicine

## 2021-01-15 NOTE — Telephone Encounter (Signed)
Let's start with pacer check. If that looks ok we should check some labs and have him seen. Let me know  Erla Bacchi Martinique MD, [facility]

## 2021-01-15 NOTE — Telephone Encounter (Signed)
Spoke to patient's son Louie Casa Dr.Jordan's advice given.Advised I left message with Dr.Taylor's scheduler to schedule father appointment.Message sent to device pool to check pacemaker.

## 2021-01-15 NOTE — Telephone Encounter (Signed)
Spoke to EP scheduler she just spoke to patient's son Louie Casa.She scheduled appointment with Tommye Standard PA Thurs 5/5 at 11:45 am.

## 2021-01-15 NOTE — Telephone Encounter (Signed)
LMOM  to call device clinic. Have him assist patient with sending remote transmission. Discuss follow-up appointment.

## 2021-01-15 NOTE — Telephone Encounter (Signed)
Patient transmission reviewed with patient and [name](son). Device has reached RRT as of 12/25/20 and is now in VVI mode at rate of 65 bpm. Explained that increase in fatigue may be related to current PPM mode. No report of CP. Chest pressure Will schedule patient for follow-up with Dr Lovena Le to discuss generator change. Patient requested that his son Louie Casa be contacted to make appointment by calling  [phone].

## 2021-01-15 NOTE — Telephone Encounter (Signed)
[name](DPR) reports that patient has been outside feeding the birds today and reports feeling tired after the activity. Louie Casa will assist the patient in sending remote transmission and call the device clinic after the remote is sent. Son aware that transmission will be assessed for battery life, alerts and device function. If device has reached ERI / RRT patient will be scheduled for appointment to discuss gen change.p

## 2021-01-16 LAB — CUP PACEART REMOTE DEVICE CHECK
Battery Impedance: 7068 Ohm
Battery Voltage: 2.61 V
Brady Statistic RV Percent Paced: 100 %
Date Time Interrogation Session: 20220503150322
Implantable Lead Implant Date: 20040811
Implantable Lead Implant Date: 20040811
Implantable Lead Location: 753859
Implantable Lead Location: 753860
Implantable Lead Model: 4469
Implantable Lead Model: 4470
Implantable Lead Serial Number: 421267
Implantable Lead Serial Number: 436022
Implantable Pulse Generator Implant Date: 20120601
Lead Channel Impedance Value: 439 Ohm
Lead Channel Impedance Value: 67 Ohm
Lead Channel Setting Pacing Amplitude: 2.5 V
Lead Channel Setting Pacing Pulse Width: 0.4 ms
Lead Channel Setting Sensing Sensitivity: 1 mV

## 2021-01-17 ENCOUNTER — Encounter: Payer: Self-pay | Admitting: Physician Assistant

## 2021-01-17 ENCOUNTER — Other Ambulatory Visit: Payer: Self-pay

## 2021-01-17 ENCOUNTER — Ambulatory Visit: Payer: Medicare Other | Admitting: Physician Assistant

## 2021-01-17 VITALS — BP 124/60 | HR 65 | Ht 62.0 in | Wt 152.6 lb

## 2021-01-17 DIAGNOSIS — I2510 Atherosclerotic heart disease of native coronary artery without angina pectoris: Secondary | ICD-10-CM

## 2021-01-17 DIAGNOSIS — I441 Atrioventricular block, second degree: Secondary | ICD-10-CM | POA: Diagnosis not present

## 2021-01-17 DIAGNOSIS — Z952 Presence of prosthetic heart valve: Secondary | ICD-10-CM

## 2021-01-17 DIAGNOSIS — Z950 Presence of cardiac pacemaker: Secondary | ICD-10-CM | POA: Diagnosis not present

## 2021-01-17 DIAGNOSIS — I442 Atrioventricular block, complete: Secondary | ICD-10-CM | POA: Diagnosis not present

## 2021-01-17 DIAGNOSIS — I10 Essential (primary) hypertension: Secondary | ICD-10-CM

## 2021-01-17 LAB — CUP PACEART INCLINIC DEVICE CHECK
Battery Impedance: 7624 Ohm
Battery Voltage: 2.6 V
Brady Statistic RV Percent Paced: 100 %
Date Time Interrogation Session: 20220505163737
Implantable Lead Implant Date: 20040811
Implantable Lead Implant Date: 20040811
Implantable Lead Location: 753859
Implantable Lead Location: 753860
Implantable Lead Model: 4469
Implantable Lead Model: 4470
Implantable Lead Serial Number: 421267
Implantable Lead Serial Number: 436022
Implantable Pulse Generator Implant Date: 20120601
Lead Channel Impedance Value: 454 Ohm
Lead Channel Impedance Value: 67 Ohm
Lead Channel Pacing Threshold Amplitude: 1 V
Lead Channel Pacing Threshold Pulse Width: 0.4 ms
Lead Channel Setting Pacing Amplitude: 2.5 V
Lead Channel Setting Pacing Pulse Width: 0.4 ms
Lead Channel Setting Sensing Sensitivity: 1 mV

## 2021-01-17 LAB — BASIC METABOLIC PANEL
BUN/Creatinine Ratio: 22 (ref 10–24)
BUN: 31 mg/dL (ref 10–36)
CO2: 32 mmol/L — ABNORMAL HIGH (ref 20–29)
Calcium: 9.2 mg/dL (ref 8.6–10.2)
Chloride: 99 mmol/L (ref 96–106)
Creatinine, Ser: 1.44 mg/dL — ABNORMAL HIGH (ref 0.76–1.27)
Glucose: 74 mg/dL (ref 65–99)
Potassium: 4.2 mmol/L (ref 3.5–5.2)
Sodium: 140 mmol/L (ref 134–144)
eGFR: 43 mL/min/{1.73_m2} — ABNORMAL LOW (ref >59)

## 2021-01-17 LAB — CBC
Hematocrit: 31.9 % — ABNORMAL LOW (ref 37.5–51.0)
Hemoglobin: 10 g/dL — ABNORMAL LOW (ref 13.0–17.7)
MCH: 27.9 pg (ref 26.6–33.0)
MCHC: 31.3 g/dL — ABNORMAL LOW (ref 31.5–35.7)
MCV: 89 fL (ref 79–97)
Platelets: 200 10*3/uL (ref 150–450)
RBC: 3.59 x10E6/uL — ABNORMAL LOW (ref 4.14–5.80)
RDW: 13.8 % (ref 11.6–15.4)
WBC: 8.3 10*3/uL (ref 3.4–10.8)

## 2021-01-17 NOTE — Patient Instructions (Signed)
Medication Instructions:   Your physician recommends that you continue on your current medications as directed. Please refer to the Current Medication list given to you today.  *If you need a refill on your cardiac medications before your next appointment, please call your pharmacy*   Lab Work: BMET AND CBC TODAY   If you have labs (blood work) drawn today and your tests are completely normal, you will receive your results only by: Marland Kitchen MyChart Message (if you have MyChart) OR . A paper copy in the mail If you have any lab test that is abnormal or we need to change your treatment, we will call you to review the results.   Testing/Procedures: SEE LETTER FOR GEN CHANGE OUT    Follow-Up: At [facility], you and your health needs are our priority.  As part of our continuing mission to provide you with exceptional heart care, we have created designated Provider Care Teams.  These Care Teams include your primary Cardiologist (physician) and Advanced Practice Providers (APPs -  Physician Assistants and Nurse Practitioners) who all work together to provide you with the care you need, when you need it.  We recommend signing up for the patient portal called "MyChart".  Sign up information is provided on this After Visit Summary.  MyChart is used to connect with patients for Virtual Visits (Telemedicine).  Patients are able to view lab/test results, encounter notes, upcoming appointments, etc.  Non-urgent messages can be sent to your provider as well.   To learn more about what you can do with MyChart, go to [email or web address].    Your next appointment:  AFTER 02-01-21    [place]   Other Instructions   Implantable Device Instructions  You are scheduled for: 02-01-21 on with Dr. Lovena Le   1.   Pre procedure testing-             A.  LAB WORK--- On 01-17-2021  for your pre procedure blood work.  You do NOT need to be fasting.               B. COVID TEST-- On 01-29-2021  This is a Drive Up Visit at [address] [place], [place] [address].  Someone will direct you to the appropriate testing line. Stay in your car and someone will be with you shortly.   After you are tested please go home and self quarantine until the day of your procedure.    2. On the day of your procedure 02-01-21 you will go to [facility] [id number]) at 9:30 am.  Dennis Bast will go to the main entrance A [employer]) and enter where the [employer] are.  You will check in at ADMITTING.  You may have one support person come in to the hospital with you.  They will be asked to wait in the waiting room.   3.   Do not eat or drink after midnight prior to your procedure.   4.   MAKE SURE YOU HOLD PLAVIX 2 DAYS PRIOR TO PROCEDURE .  5.  The night before your procedure and the morning of your procedure scrub your neck/chest with surgical scrub.  See instruction letter.   5.  Plan for an overnight stay, but you may be discharged home after your procedure. If you use your phone frequently bring your phone charger, in case you have to stay.  If you  are discharged after your procedure you will need someone to drive you home and be with your for 24 hours after your procedure.   6.  You will follow up with the [facility] clinic 10-14 days after your procedure. You will follow up with Dr. Lovena Le 91 days after your procedure.  These appointments will be made for you.   * If you have ANY questions after you get home, please call the office [phone]

## 2021-01-17 NOTE — Progress Notes (Signed)
Cardiology Office Note Date:  01/17/2021  Patient ID:  Jordan [name], Jordan [name] Apr 12, 1918, MRN [id number] PCP:  Leanna Battles, MD  Cardiologist:  Dr. Martinique Electrophysiologist: Dr. Lovena Le    Chief Complaint: device RRT  History of Present Illness: Jordan [name] is a 87 y.o. male with history of CAD (PCI to LAD 1999, cath 2013 with NOD), VHD (s/p TAVR 2016), CHB w/PPM, AAA follows with vascular, scalp cancer (completed RT for this), HTN, HLD  He comes in today to be seen for Dr. Lovena Le last seen by him Mary Sella this year, doing well, some DOE though fairly sedentary, at that time est 61mo to ERI  More recently saw dr. Martinique in Feb 2022, doing pretty well, mentioned a couple falls.  Sleeps in his recliner, wakes with cold feet in the morning.   though no claudication.  Following with nephrology for a renal mass reported as stable  Pt's son called recently with pt c/o unusual fatigued after minimal activity Device check noted he had reached RRT and device reverted to VVI pacing and suspect the cause for his fatigue.  RRT 12/25/20  TODAY He comes accompanied by his son. He does not do much physically but in the last week or two is more tired and fatigues easier then usual No CP, palpitations or cardiac awareness No dizzy spells, near syncope or syncope.  He has hx of skin cancer Had XRT to his scalp last year finished in October, large area of scabbing remains He had a lesion removed a couple days agofrom the base of his neck on the left and place empirically on antibiotic for 12 days   Device information MDT dual chamber PPM implanted 2004, gen change 02/14/2011   Past Medical History:  Diagnosis Date  . AAA (abdominal aortic aneurysm) ([place])   . Anemia   . Aortic stenosis   . Arthritis 09/18/11   "in my knees"  . Basal cell carcinoma of skin   . Blood transfusion   . BPH (benign prostatic hyperplasia)   . CAD (coronary artery disease)    Remote stent to LAD in 1999. Last  cath in 2004 and he is managed medically  . Colon polyps   . Complication of anesthesia   . Dysrhythmia    paced  . GERD (gastroesophageal reflux disease)   . Hemorrhoid   . History of radiation therapy 05/23/20-06/19/20   Radiation to scalp and RUE , Dr. Gery Pray   . HTN (hypertension)   . Hyperlipidemia   . Lung nodule    RLL  . 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 Renal mass, left   . 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 CARDIAC CATHETERIZATION  2004   Managed medically  . CARDIAC CATHETERIZATION    . CARDIAC CATHETERIZATION N/A 04/11/2015   Procedure: Right/Left Heart Cath and Coronary Angiography;  Surgeon: Burnell Blanks, MD;  Location: [facility] CV LAB;  Service: Cardiovascular;  Laterality: N/A;  . CORONARY ANGIOGRAM  09/19/2011   Procedure: CORONARY ANGIOGRAM;  Surgeon: Josue Hector, MD;  Location: [facility] CATH LAB;  Service: Cardiovascular;;  . CORONARY ANGIOPLASTY WITH STENT PLACEMENT  05/09/1998   "1"; LAD  . INSERT / REPLACE / REMOVE PACEMAKER  2004   initial placement; [employer]  . INSERT / REPLACE /  REMOVE PACEMAKER  02/14/2011  . NASAL HEMORRHAGE CONTROL  1980's   Clips placed to stop bleeding  . SKIN CANCER EXCISION  09/18/11   "have had a couple hundred of them removed since I was in my 40's"  . TEE WITHOUT CARDIOVERSION N/A 05/15/2015   Procedure: TRANSESOPHAGEAL ECHOCARDIOGRAM (TEE);  Surgeon: Burnell Blanks, MD;  Location: [facility];  Service: Open Heart Surgery;  Laterality: N/A;  . TONSILLECTOMY     "when I was a teenager"  . TRANSCATHETER AORTIC VALVE REPLACEMENT, TRANSFEMORAL N/A 05/15/2015   Procedure: TRANSCATHETER AORTIC VALVE REPLACEMENT, TRANSFEMORAL;  Surgeon: Burnell Blanks, MD;  Location: [facility];  Service: Open Heart Surgery;  Laterality: N/A;     Current Outpatient Medications  Medication Sig Dispense Refill  . acetaminophen (TYLENOL) 325 MG tablet Take 650 mg by mouth every 6 (six) hours as needed for mild pain, moderate pain or fever.     Marland Kitchen amLODipine (NORVASC) 5 MG tablet TAKE 1 TABLET BY MOUTH EVERY DAY. NEED OFFICE VISIT 90 tablet 2  . atorvastatin (LIPITOR) 10 MG tablet Take 1 tablet (10 mg total) by mouth daily. 90 tablet 3  . cephALEXin (KEFLEX) 500 MG capsule Take 500 mg by mouth 2 (two) times daily.    . clopidogrel (PLAVIX) 75 MG tablet Take 1 tablet (75 mg total) by mouth daily. 90 tablet 3  . NEXIUM 24HR 20 MG capsule TAKE 1 CAPSULE BY MOUTH DAILY AT 12 NOON**PLEASE SCHEDULE AN APPT FOR FUTURE REFILLS 90 capsule 4  . silodosin (RAPAFLO) 4 MG CAPS capsule Take 4 mg by mouth every evening.     . torsemide (DEMADEX) 20 MG tablet Take 20 mg by mouth daily.     No current facility-administered medications for this visit.    Allergies:   Aspirin and Nitroglycerin   Social History:  The patient  reports that he quit smoking about 46 years ago. His smoking use included cigarettes. He has a 42.00 pack-year smoking history. He has quit using smokeless tobacco.  His smokeless tobacco use included chew. He reports that he does not drink alcohol and does not use drugs.   Family History:  The patient's family history includes Cancer in his father; Colon cancer in his father; Diabetes in an other family member; Heart disease in his brother and brother.  ROS:  Please see the history of present illness.    All other systems are reviewed and otherwise negative.   PHYSICAL EXAM:  VS:  BP 124/60   Pulse 65   Ht 5\' 2"  (1.575 m)   Wt 152 lb 9.6 oz (69.2 kg)   SpO2 97%   BMI 27.91 kg/m  BMI: Body mass index is 27.91 kg/m. Well nourished, well developed, in no acute distress, looks his age 59: normocephalic, atraumatic Neck: no JVD, carotid bruits or masses, bandage L neck, no bleeding Cardiac:  RRR; no significant murmurs,  no rubs, or gallops Lungs:  CTA b/l, no wheezing, rhonchi or rales Abd: soft, nontender MS: no deformity, age appropriate atrophyatrophy Ext: no edema Skin: warm and dry, multiple skin lesions, large circular scab/lesion top of his head Neuro:  No gross deficits appreciated Psych: euthymic mood, full affect  PPM site is R side, stable, no tethering or discomfort   EKG:  Done today and reviewed by myself shows  Asynchronous V pacing 65  Device interrogation done today and reviewed by myself:  RRT reached 12/27/20 V lead testing is good  09/25/2019: TTE IMPRESSIONS  1. Left  ventricular ejection fraction, by visual estimation, is 60 to  65%. The left ventricle has normal function. There is mildly increased  left ventricular hypertrophy.  2. Elevated left atrial pressure.  3. Left ventricular diastolic parameters are consistent with Grade I  diastolic dysfunction (impaired relaxation).  4. The left ventricle has no regional wall motion abnormalities.  5. Global right ventricle has normal systolic function.The right  ventricular size is normal.  6. Left atrial size was mildly dilated.  7. Right atrial size was normal.  8. Severe mitral annular calcification.  9. The mitral valve is normal in structure. Mild mitral valve  regurgitation. No evidence of mitral stenosis.  10. The tricuspid valve is normal in structure.  11. The aortic valve is normal in structure. Aortic valve regurgitation is  not visualized. No evidence of aortic valve sclerosis or stenosis.  12. The pulmonic valve was normal in structure. Pulmonic valve  regurgitation is not visualized.  13. The inferior vena cava is normal in size with greater than 50%  respiratory variability, suggesting right atrial pressure of 3 mmHg.  14. Normal LV systolic function; mild LVH; grade 1 diastolic dysfunction;  s/p TAVR with mean gradient 10 mmHg; AVA 1.8 cm2) and no AI; mild LAE;  mild MR.    04/11/2015: LHC  Prox  RCA lesion, 30% stenosed.  Prox LAD lesion, 40% stenosed.  Prox Cx lesion, 30% stenosed.  Severe aortic valve stenosis   1. Mild to moderate non-obstructive CAD  2. Severe aortic valve stenosis (mean gradient 41 mmHg, Peak to peak 59 mm Hg), AVA 1.35 cm2  Of note, this was a very difficult procedure with use of many catheters to attempt selective coronary engagement which was limited due to calcification and tortuosity of vessels.    Recent Labs: 11/02/2020: ALT 11; BUN 23; Creatinine, Ser 1.36; Hemoglobin 10.8; Platelets 208; Potassium 4.8; Sodium 144  11/02/2020: Chol/HDL Ratio 3.9; Cholesterol, Total 140; HDL 36; LDL Chol Calc (NIH) 69; Triglycerides 211   CrCl cannot be calculated (Patient's most recent lab result is older than the maximum 21 days allowed.).   Wt Readings from Last 3 Encounters:  01/17/21 152 lb 9.6 oz (69.2 kg)  12/10/20 154 lb 3.2 oz (69.9 kg)  11/02/20 153 lb 3.2 oz (69.5 kg)     Other studies reviewed: Additional studies/records reviewed today include: summarized above  ASSESSMENT AND PLAN:  1. PPM     Reached RRT 12/25/20     We discussed generator change procedure potential risks and benefits Infection discussed specifically He had a skin lesion remeved on L side of his neck has antibiotics rx for prophyllaxis, he will be finished with these and should be healed up prior to his gen changes  We have requested an antimicrobial pouch for his procedure  2. CAD     No anginal symptoms     On plavix, statin     C/w Dr. Martinique  3. VHD s/p TAVR     Looks OK echo last year       4. HTN     Looks good  Disposition: F/u with routine post gen change follow up  Current medicines are reviewed at length with the patient today.  The patient did not have any concerns regarding medicines.  Venetia Night, PA-C 01/17/2021 1:01 PM     [address] [place] [address] [phone] (office)  [phone]  (fax)

## 2021-01-21 ENCOUNTER — Ambulatory Visit (INDEPENDENT_AMBULATORY_CARE_PROVIDER_SITE_OTHER): Payer: Medicare Other

## 2021-01-21 DIAGNOSIS — I442 Atrioventricular block, complete: Secondary | ICD-10-CM

## 2021-01-22 LAB — CUP PACEART REMOTE DEVICE CHECK
Battery Impedance: 7360 Ohm
Battery Voltage: 2.6 V
Brady Statistic RV Percent Paced: 100 %
Date Time Interrogation Session: 20220509103827
Implantable Lead Implant Date: 20040811
Implantable Lead Implant Date: 20040811
Implantable Lead Location: 753859
Implantable Lead Location: 753860
Implantable Lead Model: 4469
Implantable Lead Model: 4470
Implantable Lead Serial Number: 421267
Implantable Lead Serial Number: 436022
Implantable Pulse Generator Implant Date: 20120601
Lead Channel Impedance Value: 457 Ohm
Lead Channel Impedance Value: 67 Ohm
Lead Channel Setting Pacing Amplitude: 2.5 V
Lead Channel Setting Pacing Pulse Width: 0.4 ms
Lead Channel Setting Sensing Sensitivity: 1 mV

## 2021-01-23 ENCOUNTER — Ambulatory Visit: Payer: Medicare Other

## 2021-01-24 ENCOUNTER — Other Ambulatory Visit: Payer: Self-pay | Admitting: Cardiology

## 2021-01-29 ENCOUNTER — Other Ambulatory Visit (HOSPITAL_COMMUNITY)
Admission: RE | Admit: 2021-01-29 | Discharge: 2021-01-29 | Disposition: A | Discharge status: Home / Self Care | Payer: Medicare Other | Source: Ambulatory Visit | Attending: Internal Medicine | Admitting: Internal Medicine

## 2021-01-29 DIAGNOSIS — Z20822 Contact with and (suspected) exposure to covid-19: Secondary | ICD-10-CM | POA: Insufficient documentation

## 2021-01-29 DIAGNOSIS — Z01812 Encounter for preprocedural laboratory examination: Secondary | ICD-10-CM | POA: Diagnosis present

## 2021-01-29 LAB — SARS CORONAVIRUS 2 (TAT 6-24 HRS): SARS Coronavirus 2: NEGATIVE

## 2021-01-31 ENCOUNTER — Telehealth: Payer: Self-pay | Admitting: Physician Assistant

## 2021-01-31 NOTE — Telephone Encounter (Signed)
New message:    Patient son calling to ask a few question concering his apt tomorrow.

## 2021-01-31 NOTE — Telephone Encounter (Signed)
Returned call to Pt's son.  Went over all instructions for tomorrow's gen change.  Advised Pt could take all his normal morning medications with a sip of water except torsemide.  He has been holding plavix as instructed.  All questions answered.

## 2021-02-01 ENCOUNTER — Other Ambulatory Visit: Payer: Self-pay

## 2021-02-01 ENCOUNTER — Ambulatory Visit (HOSPITAL_COMMUNITY)
Admission: RE | Admit: 2021-02-01 | Discharge: 2021-02-01 | Disposition: A | Discharge status: Home / Self Care | Payer: Medicare Other | Attending: Internal Medicine | Admitting: Internal Medicine

## 2021-02-01 ENCOUNTER — Encounter (HOSPITAL_COMMUNITY)
Admission: RE | Disposition: A | Discharge status: Home / Self Care | Payer: Self-pay | Source: Home / Self Care | Attending: Internal Medicine

## 2021-02-01 DIAGNOSIS — Z45010 Encounter for checking and testing of cardiac pacemaker pulse generator [battery]: Secondary | ICD-10-CM | POA: Insufficient documentation

## 2021-02-01 DIAGNOSIS — I714 Abdominal aortic aneurysm, without rupture: Secondary | ICD-10-CM | POA: Diagnosis not present

## 2021-02-01 DIAGNOSIS — I350 Nonrheumatic aortic (valve) stenosis: Secondary | ICD-10-CM | POA: Insufficient documentation

## 2021-02-01 DIAGNOSIS — Z888 Allergy status to other drugs, medicaments and biological substances status: Secondary | ICD-10-CM | POA: Insufficient documentation

## 2021-02-01 DIAGNOSIS — Z87891 Personal history of nicotine dependence: Secondary | ICD-10-CM | POA: Insufficient documentation

## 2021-02-01 DIAGNOSIS — Z955 Presence of coronary angioplasty implant and graft: Secondary | ICD-10-CM | POA: Diagnosis not present

## 2021-02-01 DIAGNOSIS — I10 Essential (primary) hypertension: Secondary | ICD-10-CM | POA: Insufficient documentation

## 2021-02-01 DIAGNOSIS — Z79899 Other long term (current) drug therapy: Secondary | ICD-10-CM | POA: Insufficient documentation

## 2021-02-01 DIAGNOSIS — Z85828 Personal history of other malignant neoplasm of skin: Secondary | ICD-10-CM | POA: Diagnosis not present

## 2021-02-01 DIAGNOSIS — Z800 Family history of malignant neoplasm of digestive organs: Secondary | ICD-10-CM | POA: Diagnosis not present

## 2021-02-01 DIAGNOSIS — I442 Atrioventricular block, complete: Secondary | ICD-10-CM | POA: Diagnosis not present

## 2021-02-01 DIAGNOSIS — Z86010 Personal history of colonic polyps: Secondary | ICD-10-CM | POA: Insufficient documentation

## 2021-02-01 DIAGNOSIS — Z8249 Family history of ischemic heart disease and other diseases of the circulatory system: Secondary | ICD-10-CM | POA: Insufficient documentation

## 2021-02-01 DIAGNOSIS — Z952 Presence of prosthetic heart valve: Secondary | ICD-10-CM | POA: Diagnosis not present

## 2021-02-01 DIAGNOSIS — I2510 Atherosclerotic heart disease of native coronary artery without angina pectoris: Secondary | ICD-10-CM | POA: Diagnosis not present

## 2021-02-01 DIAGNOSIS — Z886 Allergy status to analgesic agent status: Secondary | ICD-10-CM | POA: Diagnosis not present

## 2021-02-01 DIAGNOSIS — E785 Hyperlipidemia, unspecified: Secondary | ICD-10-CM | POA: Diagnosis not present

## 2021-02-01 DIAGNOSIS — Z7902 Long term (current) use of antithrombotics/antiplatelets: Secondary | ICD-10-CM | POA: Diagnosis not present

## 2021-02-01 HISTORY — PX: PPM GENERATOR CHANGEOUT: EP1233

## 2021-02-01 SURGERY — PPM GENERATOR CHANGEOUT

## 2021-02-01 MED ORDER — CEFAZOLIN SODIUM-DEXTROSE 2-4 GM/100ML-% IV SOLN
INTRAVENOUS | Status: AC
  Filled 2021-02-01: qty 100

## 2021-02-01 MED ORDER — LIDOCAINE HCL (PF) 1 % IJ SOLN
INTRAMUSCULAR | Status: AC
  Filled 2021-02-01: qty 30

## 2021-02-01 MED ORDER — LIDOCAINE HCL (PF) 1 % IJ SOLN
INTRAMUSCULAR | Status: DC | PRN
  Administered 2021-02-01: 60 mL

## 2021-02-01 MED ORDER — SODIUM CHLORIDE 0.9 % IV SOLN
80.0000 mg | INTRAVENOUS | Status: AC
  Administered 2021-02-01: 80 mg

## 2021-02-01 MED ORDER — CHLORHEXIDINE GLUCONATE 4 % EX LIQD: 4.0000 "application " | Freq: Once | CUTANEOUS | Status: DC

## 2021-02-01 MED ORDER — SODIUM CHLORIDE 0.9 % IV SOLN: INTRAVENOUS | Status: DC

## 2021-02-01 MED ORDER — CEFAZOLIN SODIUM-DEXTROSE 2-4 GM/100ML-% IV SOLN
2.0000 g | INTRAVENOUS | Status: AC
  Administered 2021-02-01: 2 g via INTRAVENOUS

## 2021-02-01 MED ORDER — SODIUM CHLORIDE 0.9 % IV SOLN
INTRAVENOUS | Status: AC
  Filled 2021-02-01: qty 2

## 2021-02-01 MED ORDER — POVIDONE-IODINE 10 % EX SWAB: 2.0000 "application " | Freq: Once | CUTANEOUS | Status: DC

## 2021-02-01 SURGICAL SUPPLY — 6 items
CABLE SURGICAL S-101-97-12 (CABLE) ×2 IMPLANT
IPG PACE AZUR XT DR MRI W1DR01 (Pacemaker) ×1 IMPLANT
PACE AZURE XT DR MRI W1DR01 (Pacemaker) ×2 IMPLANT
PAD PRO RADIOLUCENT 2001M-C (PAD) ×2 IMPLANT
POUCH AIGIS-R ANTIBACT PPM (Mesh General) ×2 IMPLANT
TRAY PACEMAKER INSERTION (PACKS) ×2 IMPLANT

## 2021-02-01 NOTE — H&P (Signed)
HPI [name] returns today for followup. He is a pleasant 87 yo man with aortic stenosis, s/p TAVR, CHB, s/p PPM, dyslipidemia and HTN. Despite his advanced age, he has not been in the hospital. He has dyspnea with exertion but is fairly sedentary. He has not had syncope. No anginal symptoms.      Allergies  Allergen Reactions  . Aspirin Other (See Comments)    Stomach bleeds  . Nitroglycerin     Makes patient faint           Current Outpatient Medications  Medication Sig Dispense Refill  . acetaminophen (TYLENOL) 325 MG tablet Take 650 mg by mouth every 6 (six) hours as needed for mild pain, moderate pain or fever.     Marland Kitchen amLODipine (NORVASC) 5 MG tablet TAKE 1 TABLET BY MOUTH EVERY DAY. NEED OFFICE VISIT 90 tablet 2  . atorvastatin (LIPITOR) 10 MG tablet Take 1 tablet (10 mg total) by mouth daily. 90 tablet 3  . clopidogrel (PLAVIX) 75 MG tablet Take 1 tablet (75 mg total) by mouth daily. 90 tablet 3  . NEXIUM 24HR 20 MG capsule TAKE 1 CAPSULE BY MOUTH DAILY AT 12 NOON 84 capsule 4  . silodosin (RAPAFLO) 4 MG CAPS capsule Take 4 mg by mouth every evening.     . torsemide (DEMADEX) 20 MG tablet Take 20 mg by mouth daily.     No current facility-administered medications for this visit.         Past Medical History:  Diagnosis Date  . AAA (abdominal aortic aneurysm) ([place])   . Anemia   . Aortic stenosis   . Arthritis 09/18/11   "in my knees"  . Basal cell carcinoma of skin   . Blood transfusion   . BPH (benign prostatic hyperplasia)   . CAD (coronary artery disease)    Remote stent to LAD in 1999. Last cath in 2004 and he is managed medically  . Colon polyps   . Complication of anesthesia   . Dysrhythmia    paced  . GERD (gastroesophageal reflux disease)   . Hemorrhoid   . HTN (hypertension)   . Hyperlipidemia   . Lung nodule    RLL  . 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 Renal mass, left   . 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        Past Surgical History:  Procedure Laterality Date  . CARDIAC CATHETERIZATION  2004   Managed medically  . CARDIAC CATHETERIZATION    . CARDIAC CATHETERIZATION N/A 04/11/2015   Procedure: Right/Left Heart Cath and Coronary Angiography;  Surgeon: Burnell Blanks, MD;  Location: [facility];  Service: Cardiovascular;  Laterality: N/A;  . CORONARY ANGIOGRAM  09/19/2011   Procedure: CORONARY ANGIOGRAM;  Surgeon: Josue Hector, MD;  Location: [facility] CATH LAB;  Service: Cardiovascular;;  . CORONARY ANGIOPLASTY WITH STENT PLACEMENT  05/09/1998   "1"; LAD  . INSERT / REPLACE / REMOVE PACEMAKER  2004   initial placement; [employer]  . INSERT / REPLACE / REMOVE PACEMAKER  02/14/2011  . NASAL HEMORRHAGE CONTROL  1980's   Clips placed to stop bleeding  . SKIN CANCER EXCISION  09/18/11   "have had a couple hundred of them removed  since I was in my 60's"  . TEE WITHOUT CARDIOVERSION N/A 05/15/2015   Procedure: TRANSESOPHAGEAL ECHOCARDIOGRAM (TEE);  Surgeon: Burnell Blanks, MD;  Location: [facility];  Service: Open Heart Surgery;  Laterality: N/A;  . TONSILLECTOMY     "when I was a teenager"  . TRANSCATHETER AORTIC VALVE REPLACEMENT, TRANSFEMORAL N/A 05/15/2015   Procedure: TRANSCATHETER AORTIC VALVE REPLACEMENT, TRANSFEMORAL;  Surgeon: Burnell Blanks, MD;  Location: [facility];  Service: Open Heart Surgery;  Laterality: N/A;          Family History  Problem Relation Age of Onset  . Cancer Father        stomach  . Colon cancer Father   . Heart disease Brother   . Heart disease Brother   . Diabetes Other        granddaughter     Social History        Socioeconomic History  . Marital status: Widowed     Spouse name: Not on file  . Number of children: 2  . Years of education: Not on file  . Highest education level: Not on file  Occupational History  . Occupation: Retired    Comment: Building control surveyor  Tobacco Use  . Smoking status: Former Smoker    Packs/day: 1.00    Years: 42.00    Pack years: 42.00    Types: Cigarettes    Quit date: 08/18/1974    Years since quitting: 46.1  . Smokeless tobacco: Former Systems developer    Types: Secondary school teacher  . Vaping Use: Never used  Substance and Sexual Activity  . Alcohol use: No    Alcohol/week: 0.0 standard drinks  . Drug use: No  . Sexual activity: Not Currently  Other Topics Concern  . Not on file  Social History Narrative  . Not on file   Social Determinants of Health   Financial Resource Strain: Not on file  Food Insecurity: Not on file  Transportation Needs: Not on file  Physical Activity: Not on file  Stress: Not on file  Social Connections: Not on file  Intimate Partner Violence: Not on file     BP 122/72   Pulse 91   Ht 5\' 2"  (1.575 m)   Wt 152 lb 3.2 oz (69 kg)   SpO2 92%   BMI 27.84 kg/m   Physical Exam:  Well appearing elderly man, NAD HEENT: Unremarkable Neck:  No JVD, no thyromegally Lymphatics:  No adenopathy Back:  No CVA tenderness Lungs:  Clear with no wheezes HEART:  Regular rate rhythm, no murmurs, no rubs, no clicks Abd:  soft, positive bowel sounds, no organomegally, no rebound, no guarding Ext:  2 plus pulses, no edema, no cyanosis, no clubbing Skin:  No rashes no nodules Neuro:  CN II through XII intact, motor grossly intact  ECG - NSR with P synchronous ventricular pacing  DEVICE  Normal device function.  See PaceArt for details.   Assess/Plan: 1. CHB - he is asymptomatic, s/p PPM insertion 2. AS - he is doing well, s/p TAVR. 3. HTN - her bp is well controlled. We will follow. 4. PPM -he is still 9 month from ERI.  Salome Spotted  EP Attending  Patient seen and  examined. Since his prior clinic visit he has reached ERI and will undergo PPM gen change out.   Carleene Overlie Lavaris Sexson,MD

## 2021-02-01 NOTE — Discharge Instructions (Signed)
Implantable Cardiac Device Battery Change,  KEEP DRY TILL WOUND CHECK HOLD PLAVIX TILL 5/25 Supplemental Discharge Instructions for  Pacemaker/Defibrillator Patients  Activity Do not raise your left/right arm above shoulder level or extend it backward beyond shoulder level for 2 weeks. .    NO DRIVING is preferable for 2 weeks; If absolutely necessary, drive only short, familiar routes. DO wear your seatbelt, even if it crosses over the pacemaker site.  WOUND CARE - Keep the wound area clean and dry.  Remove the dressing the day after you return home (usually 48 hours after the procedure). - DO NOT SUBMERGE UNDER WATER UNTIL FULLY HEALED (no tub baths, hot tubs, swimming pools, etc.).  - You  may shower or take a sponge bath after the dressing is removed. DO NOT SOAK the area and do not allow the shower to directly spray on the site. - If you have staples, these will be removed in the office in 7-14 days. - If you have tape/steri-strips on your wound, these will fall off; do not pull them off prematurely.   - No bandage is needed on the site.  DO  NOT apply any creams, oils, or ointments to the wound area. - If you notice any drainage or discharge from the wound, any swelling, excessive redness or bruising at the site, or if you develop a fever > 101? F after you are discharged home, call the office at once.  Special Instructions - You are still able to use cellular telephones.  Avoid carrying your cellular phone near your device. - When traveling through airports, show security personnel your identification card to avoid being screened in the metal detectors.  - Avoid arc welding equipment, MRI testing (magnetic resonance imaging), TENS units (transcutaneous nerve stimulators).  Call the office for questions about other devices. - Avoid electrical appliances that are in poor condition or are not properly grounded. - Microwave ovens are safe to be near or to operate.  Additional  information for defibrillator patients should your device go off: - If your device goes off ONCE and you feel fine afterward, notify the clinic at [phone]. - If your device goes off ONCE and you do not feel well afterward, call 911. - If your device goes off TWICE or more in one day, call 911.  DO NOT DRIVE YOURSELF OR A FAMILY MEMBER WITH A DEFIBRILLATOR TO THE HOSPITAL--CALL 911.  This sheet gives you information about how to care for yourself after your procedure. Your health care provider may also give you more specific instructions. If you have problems or questions, contact your health care provider. What can I expect after the procedure? After your procedure, it is common to have:  Pain or soreness at the site where the cardiac device was inserted.  Swelling at the site where the cardiac device was inserted.  You should received an information card for your new device in 4-8 weeks. Follow these instructions at home: Incision care   Keep the incision clean and dry. ? Do not take baths, swim, or use a hot tub until after your wound check.  ? Do not shower for at least 7 days, or as directed by your health care provider. ? Pat the area dry with a clean towel. Do not rub the area. This may cause bleeding.  Follow instructions from your health care provider about how to take care of your incision. Make sure you: ? Leave stitches (sutures), skin glue, or adhesive strips in place. These  skin closures may need to stay in place for 2 weeks or longer. If adhesive strip edges start to loosen and curl up, you may trim the loose edges. Do not remove adhesive strips completely unless your health care provider tells you to do that.  Check your incision area every day for signs of infection. Check for: ? More redness, swelling, or pain. ? More fluid or blood. ? Warmth. ? Pus or a bad smell. Activity  Do not lift anything that is heavier than 10 lb (4.5 kg) until your health care  provider says it is okay to do so.  For the first week, or as long as told by your health care provider: ? Avoid lifting your affected arm higher than your shoulder. ? After 1 week, Be gentle when you move your arms over your head. It is okay to raise your arm to comb your hair. ? Avoid strenuous exercise.  Ask your health care provider when it is okay to: ? Resume your normal activities. ? Return to work or school. ? Resume sexual activity. Eating and drinking  Eat a heart-healthy diet. This should include plenty of fresh fruits and vegetables, whole grains, low-fat dairy products, and lean protein like chicken and fish.  Limit alcohol intake to no more than 1 drink a day for non-pregnant women and 2 drinks a day for men. One drink equals 12 oz of beer, 5 oz of wine, or 1 oz of hard liquor.  Check ingredients and nutrition facts on packaged foods and beverages. Avoid the following types of food: ? Food that is high in salt (sodium). ? Food that is high in saturated fat, like full-fat dairy or red meat. ? Food that is high in trans fat, like fried food. ? Food and drinks that are high in sugar. Lifestyle  Do not use any products that contain nicotine or tobacco, such as cigarettes and e-cigarettes. If you need help quitting, ask your health care provider.  Take steps to manage and control your weight.  Once cleared, get regular exercise. Aim for 150 minutes of moderate-intensity exercise (such as walking or yoga) or 75 minutes of vigorous exercise (such as running or swimming) each week.  Manage other health problems, such as diabetes or high blood pressure. Ask your health care provider how you can manage these conditions. General instructions  Do not drive for 24 hours after your procedure if you were given a medicine to help you relax (sedative).  Take over-the-counter and prescription medicines only as told by your health care provider.  Avoid putting pressure on the area  where the cardiac device was placed.  If you need an MRI after your cardiac device has been placed, be sure to tell the health care provider who orders the MRI that you have a cardiac device.  Avoid close and prolonged exposure to electrical devices that have strong magnetic fields. These include: ? Cell phones. Avoid keeping them in a pocket near the cardiac device, and try using the ear opposite the cardiac device. ? MP3 players. ? Household appliances, like microwaves. ? Metal detectors. ? Electric generators. ? High-tension wires.  Keep all follow-up visits as directed by your health care provider. This is important. Contact a health care provider if:  You have pain at the incision site that is not relieved by over-the-counter or prescription medicines.  You have any of these around your incision site or coming from it: ? More redness, swelling, or pain. ? Fluid or blood. ?  Warmth to the touch. ? Pus or a bad smell.  You have a fever.  You feel brief, occasional palpitations, light-headedness, or any symptoms that you think might be related to your heart. Get help right away if:  You experience chest pain that is different from the pain at the cardiac device site.  You develop a red streak that extends above or below the incision site.  You experience shortness of breath.  You have palpitations or an irregular heartbeat.  You have light-headedness that does not go away quickly.  You faint or have dizzy spells.  Your pulse suddenly drops or increases rapidly and does not return to normal.  You begin to gain weight and your legs and ankles swell. Summary  After your procedure, it is common to have pain, soreness, and some swelling where the cardiac device was inserted.  Make sure to keep your incision clean and dry. Follow instructions from your health care provider about how to take care of your incision.  Check your incision every day for signs of infection, such  as more pain or swelling, pus or a bad smell, warmth, or leaking fluid and blood.  Avoid strenuous exercise and lifting your left arm higher than your shoulder for 2 weeks, or as long as told by your health care provider. This information is not intended to replace advice given to you by your health care provider. Make sure you discuss any questions you have with your health care provider.

## 2021-02-04 ENCOUNTER — Encounter (HOSPITAL_COMMUNITY): Payer: Self-pay | Admitting: Internal Medicine

## 2021-02-13 NOTE — Addendum Note (Signed)
Addended by: Cheri Kearns A on: 02/13/2021 08:39 AM   Modules accepted: Level of Service

## 2021-02-13 NOTE — Progress Notes (Signed)
Remote pacemaker transmission.   

## 2021-02-14 ENCOUNTER — Ambulatory Visit (INDEPENDENT_AMBULATORY_CARE_PROVIDER_SITE_OTHER): Payer: Medicare Other | Admitting: Emergency Medicine

## 2021-02-14 ENCOUNTER — Other Ambulatory Visit: Payer: Self-pay

## 2021-02-14 DIAGNOSIS — I442 Atrioventricular block, complete: Secondary | ICD-10-CM

## 2021-02-14 LAB — CUP PACEART INCLINIC DEVICE CHECK
Battery Remaining Longevity: 133 mo
Battery Voltage: 3.22 V
Brady Statistic AP VP Percent: 6.61 %
Brady Statistic AP VS Percent: 0 %
Brady Statistic AS VP Percent: 93.27 %
Brady Statistic AS VS Percent: 0.12 %
Brady Statistic RA Percent Paced: 6.58 %
Brady Statistic RV Percent Paced: 99.88 %
Date Time Interrogation Session: 20220602120312
Implantable Lead Implant Date: 20040811
Implantable Lead Implant Date: 20040811
Implantable Lead Location: 753859
Implantable Lead Location: 753860
Implantable Lead Model: 4469
Implantable Lead Model: 4470
Implantable Lead Serial Number: 421267
Implantable Lead Serial Number: 436022
Implantable Pulse Generator Implant Date: 20220520
Lead Channel Impedance Value: 266 Ohm
Lead Channel Impedance Value: 285 Ohm
Lead Channel Impedance Value: 361 Ohm
Lead Channel Impedance Value: 437 Ohm
Lead Channel Pacing Threshold Amplitude: 1 V
Lead Channel Pacing Threshold Amplitude: 1 V
Lead Channel Pacing Threshold Pulse Width: 0.4 ms
Lead Channel Pacing Threshold Pulse Width: 0.4 ms
Lead Channel Sensing Intrinsic Amplitude: 1.4 mV
Lead Channel Sensing Intrinsic Amplitude: 3.3 mV
Lead Channel Setting Pacing Amplitude: 1.75 V
Lead Channel Setting Pacing Amplitude: 2.25 V
Lead Channel Setting Pacing Pulse Width: 0.4 ms
Lead Channel Setting Sensing Sensitivity: 0.9 mV

## 2021-02-14 NOTE — Progress Notes (Signed)
Wound check appointment. Steri-strips removed. Wound without redness or edema. Incision edges approximated, wound well healed. Normal device function. Thresholds, sensing, and impedances consistent with implant measurements. Device programmed at chronic output, no new leads implanted. Histogram distribution appropriate for patient and level of activity. No mode switches or high ventricular rates noted. Patient educated about wound care and arm mobility. ROV in 3 months with Dr. Lovena Le 05/09/21.

## 2021-02-20 ENCOUNTER — Other Ambulatory Visit: Payer: Self-pay

## 2021-02-20 ENCOUNTER — Emergency Department (HOSPITAL_COMMUNITY)
Admission: EM | Admit: 2021-02-20 | Discharge: 2021-02-20 | Disposition: A | Discharge status: Home / Self Care | Payer: Medicare Other | Attending: Emergency Medicine | Admitting: Emergency Medicine

## 2021-02-20 DIAGNOSIS — Z85828 Personal history of other malignant neoplasm of skin: Secondary | ICD-10-CM | POA: Diagnosis not present

## 2021-02-20 DIAGNOSIS — Z950 Presence of cardiac pacemaker: Secondary | ICD-10-CM | POA: Insufficient documentation

## 2021-02-20 DIAGNOSIS — Z79899 Other long term (current) drug therapy: Secondary | ICD-10-CM | POA: Insufficient documentation

## 2021-02-20 DIAGNOSIS — I10 Essential (primary) hypertension: Secondary | ICD-10-CM | POA: Insufficient documentation

## 2021-02-20 DIAGNOSIS — Z87891 Personal history of nicotine dependence: Secondary | ICD-10-CM | POA: Diagnosis not present

## 2021-02-20 DIAGNOSIS — I2510 Atherosclerotic heart disease of native coronary artery without angina pectoris: Secondary | ICD-10-CM | POA: Insufficient documentation

## 2021-02-20 DIAGNOSIS — R339 Retention of urine, unspecified: Secondary | ICD-10-CM | POA: Insufficient documentation

## 2021-02-20 DIAGNOSIS — Z955 Presence of coronary angioplasty implant and graft: Secondary | ICD-10-CM | POA: Insufficient documentation

## 2021-02-20 DIAGNOSIS — Z7902 Long term (current) use of antithrombotics/antiplatelets: Secondary | ICD-10-CM | POA: Insufficient documentation

## 2021-02-20 LAB — URINALYSIS, ROUTINE W REFLEX MICROSCOPIC
Bacteria, UA: NONE SEEN
Bilirubin Urine: NEGATIVE
Glucose, UA: NEGATIVE mg/dL
Ketones, ur: NEGATIVE mg/dL
Leukocytes,Ua: NEGATIVE
Nitrite: NEGATIVE
Protein, ur: NEGATIVE mg/dL
Specific Gravity, Urine: 1.009 (ref 1.005–1.030)
pH: 7 (ref 5.0–8.0)

## 2021-02-20 LAB — BASIC METABOLIC PANEL
Anion gap: 10 (ref 5–15)
BUN: 31 mg/dL — ABNORMAL HIGH (ref 8–23)
CO2: 30 mmol/L (ref 22–32)
Calcium: 9.2 mg/dL (ref 8.9–10.3)
Chloride: 98 mmol/L (ref 98–111)
Creatinine, Ser: 1.45 mg/dL — ABNORMAL HIGH (ref 0.61–1.24)
GFR, Estimated: 43 mL/min — ABNORMAL LOW (ref >60)
Glucose, Bld: 109 mg/dL — ABNORMAL HIGH (ref 70–99)
Potassium: 4 mmol/L (ref 3.5–5.1)
Sodium: 138 mmol/L (ref 135–145)

## 2021-02-20 LAB — CBC WITH DIFFERENTIAL/PLATELET
Abs Immature Granulocytes: 0.03 10*3/uL (ref 0.00–0.07)
Basophils Absolute: 0 10*3/uL (ref 0.0–0.1)
Basophils Relative: 0 %
Eosinophils Absolute: 0 10*3/uL (ref 0.0–0.5)
Eosinophils Relative: 1 %
HCT: 35.1 % — ABNORMAL LOW (ref 39.0–52.0)
Hemoglobin: 10.9 g/dL — ABNORMAL LOW (ref 13.0–17.0)
Immature Granulocytes: 0 %
Lymphocytes Relative: 18 %
Lymphs Abs: 1.5 10*3/uL (ref 0.7–4.0)
MCH: 28.2 pg (ref 26.0–34.0)
MCHC: 31.1 g/dL (ref 30.0–36.0)
MCV: 90.9 fL (ref 80.0–100.0)
Monocytes Absolute: 0.6 10*3/uL (ref 0.1–1.0)
Monocytes Relative: 7 %
Neutro Abs: 5.8 10*3/uL (ref 1.7–7.7)
Neutrophils Relative %: 74 %
Platelets: 211 10*3/uL (ref 150–400)
RBC: 3.86 MIL/uL — ABNORMAL LOW (ref 4.22–5.81)
RDW: 13.4 % (ref 11.5–15.5)
WBC: 7.9 10*3/uL (ref 4.0–10.5)
nRBC: 0 % (ref 0.0–0.2)

## 2021-02-20 NOTE — Discharge Instructions (Signed)
You need to keep the catheter in, until you see the urologist.  Call them for a follow-up appointment next week.  Return here if needed for problems.

## 2021-02-20 NOTE — ED Provider Notes (Signed)
[facility] DEPT Provider Note   CSN: [id number] Arrival date & time: 02/20/21  1819     History Chief Complaint  Patient presents with   Urinary Retention    Jordan [name] is a 87 y.o. male.  HPI Patient complains of inability to urinate.  Onset, about 24 hours ago.  He denies fever, chills, vomiting or dizziness.  He is here with his son who helps to give history.  Patient reports history of prostate enlargement but cannot specify other urologic procedures or illnesses.  He reports having some sort of evaluation, of his penis and bladder "with a camera."  There are no other known active modifying factors    Past Medical History:  Diagnosis Date   AAA (abdominal aortic aneurysm) ([place])    Anemia    Aortic stenosis    Arthritis 09/18/11   "in my knees"   Basal cell carcinoma of skin    Blood transfusion    BPH (benign prostatic hyperplasia)    CAD (coronary artery disease)    Remote stent to LAD in 1999. Last cath in 2004 and he is managed medically   Colon polyps    Complication of anesthesia    Dysrhythmia    paced   GERD (gastroesophageal reflux disease)    Hemorrhoid    History of radiation therapy 05/23/20-06/19/20   Radiation to scalp and RUE , Dr. 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   Aortic stenosis, severe 05/03/2015   Chest pain 03/09/2015   Abdominal aneurysm without mention of rupture 02/24/2013   Pulmonary nodule, right 02/10/2013   Mobitz type II atrioventricular block 03/09/2012   Effusion of olecranon bursa, left 01/28/2012   Aortic stenosis 05/06/2011   Cardiac pacemaker in situ 10/29/2010   ABDOMINAL PAIN-LUQ 06/21/2008   PERSONAL HX COLONIC POLYPS 06/21/2008   HYPERLIPIDEMIA 06/19/2008   ANEMIA 06/19/2008   Coronary atherosclerosis 06/19/2008    Past Surgical History:  Procedure Laterality Date   CARDIAC CATHETERIZATION  2004   Managed medically   CARDIAC CATHETERIZATION     CARDIAC CATHETERIZATION N/A 04/11/2015   Procedure: Right/Left Heart Cath and Coronary Angiography;  Surgeon: Burnell Blanks, MD;  Location: [facility] LAB;  Service: Cardiovascular;  Laterality: N/A;   CORONARY ANGIOGRAM  09/19/2011   Procedure: CORONARY ANGIOGRAM;  Surgeon: Josue Hector, MD;  Location: [facility] CATH LAB;  Service: Cardiovascular;;   CORONARY ANGIOPLASTY WITH STENT PLACEMENT  05/09/1998   "1"; LAD   INSERT / REPLACE / REMOVE PACEMAKER  2004  initial placement; [employer]   INSERT / REPLACE / REMOVE PACEMAKER  02/14/2011   NASAL HEMORRHAGE CONTROL  1980's   Clips placed to stop bleeding   PPM GENERATOR CHANGEOUT N/A 02/01/2021   Procedure: PPM GENERATOR CHANGEOUT;  Surgeon: Evans Lance, MD;  Location: [facility] LAB;  Service: Cardiovascular;  Laterality: N/A;   SKIN CANCER EXCISION  09/18/11   "have had a couple hundred of them removed since I was in my 36's"   TEE WITHOUT CARDIOVERSION N/A 05/15/2015   Procedure: TRANSESOPHAGEAL ECHOCARDIOGRAM (TEE);  Surgeon: Burnell Blanks, MD;  Location: [facility];  Service:  Open Heart Surgery;  Laterality: N/A;   TONSILLECTOMY     "when I was a teenager"   TRANSCATHETER AORTIC VALVE REPLACEMENT, TRANSFEMORAL N/A 05/15/2015   Procedure: TRANSCATHETER AORTIC VALVE REPLACEMENT, TRANSFEMORAL;  Surgeon: Burnell Blanks, MD;  Location: [facility];  Service: Open Heart Surgery;  Laterality: N/A;       Family History  Problem Relation Age of Onset   Cancer Father        stomach   Colon cancer Father    Heart disease Brother    Heart disease Brother    Diabetes Other        granddaughter    Social History   Tobacco Use   Smoking status: Former Smoker    Packs/day: 1.00    Years: 42.00    Pack years: 42.00    Types: Cigarettes    Quit date: 08/18/1974    Years since quitting: 46.5   Smokeless tobacco: Former Systems developer    Types: Nurse, children's Use: Never used  Substance Use Topics   Alcohol use: No    Alcohol/week: 0.0 standard drinks   Drug use: No    Home Medications Prior to Admission medications   Medication Sig Start Date End Date Taking? Authorizing Provider  acetaminophen (TYLENOL) 325 MG tablet Take 650 mg by mouth every 6 (six) hours as needed for mild pain, moderate pain or fever.     [provider]  amLODipine (NORVASC) 5 MG tablet TAKE 1 TABLET BY MOUTH EVERY DAY. NEED OFFICE VISIT Patient taking differently: Take 5 mg by mouth in the morning. TAKE 1 TABLET BY MOUTH EVERY DAY. NEED OFFICE VISIT 05/14/20   Martinique, Peter M, MD  atorvastatin (LIPITOR) 10 MG tablet TAKE 1 TABLET BY MOUTH EVERY DAY Patient taking differently: Take 10 mg by mouth at bedtime. 01/24/21   Martinique, Peter M, MD  cephALEXin (KEFLEX) 500 MG capsule Take 5 mg by mouth 2 (two) times daily. 01/16/21   [provider]  clopidogrel (PLAVIX) 75 MG tablet Take 1 tablet (75 mg total) by mouth daily. Patient taking differently: Take 75 mg by mouth in the morning. 03/30/20   Martinique, Peter M, MD  NEXIUM 24HR 20 MG capsule TAKE 1 CAPSULE BY MOUTH DAILY AT  12 NOON**PLEASE SCHEDULE AN APPT FOR FUTURE REFILLS Patient taking differently: Take 20 mg by mouth daily at 12 noon. 10/05/20   Martinique, Peter M, MD  silodosin (RAPAFLO) 4 MG CAPS capsule Take 4 mg by mouth at bedtime.    [provider]  torsemide (DEMADEX) 20 MG tablet Take 20 mg by mouth in the morning.    [provider]    Allergies    Aspirin and Nitroglycerin  Review of Systems   Review of Systems  All other systems reviewed and are negative.  Physical Exam Updated Vital Signs BP  127/84   Pulse (!) 110   Temp 97.7 F (36.5 C) (Oral)   Resp 18   Ht 5\' 2"  (1.575 m)   Wt 70 kg   SpO2 95%   BMI 28.23 kg/m   Physical Exam Vitals and nursing note reviewed.  Constitutional:      Appearance: He is well-developed.  HENT:     Head: Normocephalic and atraumatic.     Right Ear: External ear normal.     Left Ear: External ear normal.  Eyes:     Conjunctiva/sclera: Conjunctivae normal.     Pupils: Pupils are equal, round, and reactive to light.  Neck:     Trachea: Phonation normal.  Cardiovascular:     Rate and Rhythm: Normal rate.  Pulmonary:     Effort: Pulmonary effort is normal.  Abdominal:     General: There is no distension.     Tenderness: There is abdominal tenderness (Suprapubic, moderate).  Musculoskeletal:        General: Normal range of motion.     Cervical back: Normal range of motion and neck supple.  Skin:    General: Skin is warm and dry.  Neurological:     Mental Status: He is alert and oriented to person, place, and time.     Cranial Nerves: No cranial nerve deficit.     Sensory: No sensory deficit.     Motor: No abnormal muscle tone.     Coordination: Coordination normal.  Psychiatric:        Mood and Affect: Mood normal.        Behavior: Behavior normal.        Thought Content: Thought content normal.        Judgment: Judgment normal.    ED Results / Procedures / Treatments   Labs (all labs ordered are listed, but only  abnormal results are displayed) Labs Reviewed  BASIC METABOLIC PANEL  CBC WITH DIFFERENTIAL/PLATELET    EKG None  Radiology No results found.  Procedures Procedures   Medications Ordered in ED Medications - No data to display  ED Course  I have reviewed the triage vital signs and the nursing notes.  Pertinent labs & imaging results that were available during my care of the patient were reviewed by me and considered in my medical decision making (see chart for details).    MDM Rules/Calculators/A&P                           Patient Vitals for the past 24 hrs:  BP Temp Temp src Pulse Resp SpO2 Height Weight  02/20/21 1945 127/84 -- -- (!) 110 18 95 % -- --  02/20/21 1847 -- -- -- -- -- -- 5\' 2"  (1.575 m) 70 kg  02/20/21 1840 (!) 122/97 97.7 F (36.5 C) Oral (!) 110 18 95 % -- --    10:38 PM Reevaluation with update and discussion. After initial assessment and treatment, an updated evaluation reveals he is comfortable after Foley catheter placed and has good urinary output.  Findings discussed with patient and son, all questions answered. Daleen Bo   Medical Decision Making:  This patient is presenting for evaluation of difficulty urinating, which does require a range of treatment options, and is a complaint that involves a moderate risk of morbidity and mortality. The differential diagnoses include bladder outlet obstruction, renal failure. I decided to review old records, and in summary elderly male presenting with decreased voiding for 24  hours.  I did not additional historical information from anyone.  Clinical Laboratory Tests Ordered, included CBC, Metabolic panel, and Urinalysis. Review indicates normal except hemoglobin slightly low.   I ordered and Reviewed the bladder scan medicine Test.  Abnormal, greater than 400 cc of urine in the urinary bladder.  Critical Interventions-clinical evaluation, laboratory testing, Foley catheterization, observation and  reassessment  After These Interventions, the Patient was reevaluated and was found with good urinary output per Foley catheter that was placed by nursing.  No indication for renal failure, or UTI.  Suspect bladder outlet obstruction.  He will need follow-up with urology for further evaluation and treatment.  CRITICAL CARE-no Performed by: Daleen Bo  Nursing Notes Reviewed/ Care Coordinated Applicable Imaging Reviewed Interpretation of Laboratory Data incorporated into ED treatment  The patient appears reasonably screened and/or stabilized for discharge and I doubt any other medical condition or other [facility] requiring further screening, evaluation, or treatment in the ED at this time prior to discharge.  Plan: Home Medications-continue usual medications; Home Treatments-Foley catheter care at home; return here if the recommended treatment, does not improve the symptoms; Recommended follow up-urology follow-up 1 week and as needed     Final Clinical Impression(s) / ED Diagnoses Final diagnoses:  Urinary retention    Rx / DC Orders ED Discharge Orders     None        Daleen Bo, MD 02/21/21 1151

## 2021-02-20 NOTE — ED Triage Notes (Signed)
Complains of not being able to urinate since last night, feels like he needs to urinate and bladder / lower abd pain 'bad.'

## 2021-02-20 NOTE — ED Notes (Signed)
An After Visit Summary was printed and given to the patient. Discharge instructions given and no further questions at this time.  Pt leaving with son. Pt A&Ox4, ambulatory.

## 2021-02-21 ENCOUNTER — Telehealth: Payer: Self-pay | Admitting: Cardiology

## 2021-02-21 NOTE — Telephone Encounter (Signed)
DAUGHTER IN LAW CALLING TO SEE IF YOU WOULD LIKE FOR PT TO CONTINUE TAKING WATER PILL AFTER ED VISIT YESTERDAY.... PLEASE ADVISE

## 2021-02-21 NOTE — Telephone Encounter (Signed)
Jordan [name](daughter in Sports coach) s/w daughter that pt was in the ER and he had a foley inserted to be taken out with-in 5-7 days (per urology). Pt son call and made an appt with his urologist for 2 weeks. I asked her multiple times to call them back to make sure they know that pt went home with foley, she states that she is 100% sure that pt's Son (her husband) told them that the foley is in. Informed her to make sure to clean the urethra every time she empty's the catheter bag. She verbalizes understanding and states that "they already told me this in the ER" she further states that she "does not need to call urology. Re-iterated that she needs to make sure they know that he has catheter. Verbalizes understanding. Informed her that pt should be taking torsemide 20mg  daily, she states that he is taking the correct medications.

## 2021-03-05 MED ORDER — TORSEMIDE 20 MG PO TABS: 20.0000 mg | ORAL_TABLET | Freq: Every day | ORAL | 3 refills | Status: DC

## 2021-03-26 ENCOUNTER — Other Ambulatory Visit: Payer: Self-pay | Admitting: Cardiology

## 2021-04-10 ENCOUNTER — Telehealth: Payer: Self-pay | Admitting: Internal Medicine

## 2021-04-10 NOTE — Telephone Encounter (Signed)
Pt's daughter in law Manus Gunning [facility]) is calling in regards to this pt receiving a bill from a procedure that was done on 02/01/21 Cath Lab, the procedure was denied by the patients insurance carrier due to not being Prior Authorized.  Manus Gunning was transferred by [facility] team.  Manus Gunning can be reached at [phone]

## 2021-04-19 ENCOUNTER — Emergency Department (HOSPITAL_BASED_OUTPATIENT_CLINIC_OR_DEPARTMENT_OTHER)
Admission: EM | Admit: 2021-04-19 | Discharge: 2021-04-19 | Disposition: A | Discharge status: Home / Self Care | Payer: Medicare Other | Attending: Emergency Medicine | Admitting: Emergency Medicine

## 2021-04-19 ENCOUNTER — Encounter (HOSPITAL_BASED_OUTPATIENT_CLINIC_OR_DEPARTMENT_OTHER): Payer: Self-pay

## 2021-04-19 ENCOUNTER — Other Ambulatory Visit: Payer: Self-pay

## 2021-04-19 ENCOUNTER — Telehealth: Payer: Self-pay | Admitting: Cardiology

## 2021-04-19 DIAGNOSIS — Z5321 Procedure and treatment not carried out due to patient leaving prior to being seen by health care provider: Secondary | ICD-10-CM | POA: Insufficient documentation

## 2021-04-19 DIAGNOSIS — L539 Erythematous condition, unspecified: Secondary | ICD-10-CM | POA: Diagnosis not present

## 2021-04-19 DIAGNOSIS — R2241 Localized swelling, mass and lump, right lower limb: Secondary | ICD-10-CM | POA: Diagnosis not present

## 2021-04-19 NOTE — Telephone Encounter (Signed)
Spoke to patient's daughter n law Manus Gunning she stated Jordan [name] is having swelling in both feet for the past 2 days.He is having redness and pain in right great toe.Stated right foot is very painful.Dr.Jordan's advice given.Take Lasix 40 mg daily.Advised to go to ED now to be evaluated.I will make Dr.Jordan aware.

## 2021-04-19 NOTE — ED Triage Notes (Signed)
Pt presents with 2 day history of right great toe/right foot swelling and redness.  Denies injury or fall, states toe started swelling for no reason.  Wearing compression socks.  States the toe does not hurt until he tries to stand on it.

## 2021-04-19 NOTE — Telephone Encounter (Signed)
Pt c/o swelling: STAT is pt has developed SOB within 24 hours  If swelling, where is the swelling located? Feet, Right is more swollen than the Left   How much weight have you gained and in what time span? No. Patient does not weigh himself daily   Have you gained 3 pounds in a day or 5 pounds in a week?   Do you have a log of your daily weights (if so, list)? no  Are you currently taking a fluid pill? Yes. Torsemide 20 mg daily  Are you currently SOB? no  Have you traveled recently? No  Daughter in law of the patient called. She said the patient's Right foot is swollen and red and angry, like it has a sunburn. His big toe also hurts and he is unable to bend his toes. His Left foot is also swollen but it does not look red. She said the patient also gets tired very quickly.  The Daughter in law feels like it is something that can not wait until the patient comes in to see Dr. Martinique 05/01/21.

## 2021-04-19 NOTE — Telephone Encounter (Signed)
He can increase his lasix to 40 mg daily for now. My concern is if his leg is red and warm he may be developing cellulitis. We could work him in on Tuesday when I am DOD but if leg looks infected should see PCP or go to ED  Brandelyn Henne Martinique MD, [facility]

## 2021-04-19 NOTE — Telephone Encounter (Signed)
Spoke with the patient's daughter-in-law who states that the patient's feet and ankles have been swollen for about a week now. She states that his right foot is red and big toe hurt. He does not weigh himself daily so she in unsure about any weight gain. She states that he does not have any shortness of breath. She states that he does not add salt to anything and tries to watch his salt intake however he does eat out a lot. She states that the patient tells her that he is elevating his feet but since she is not there all of the time she is unsure how often he is doing so. She states that he does have compression hose but they have been unable to get them on due to the swelling in his feet. Confirmed that patient is taking torsemide 20 mg daily. She would like to know if patient can be seen sooner than 8/16 and would like to speak with Dr. Doug Sou nurse, Malachy Mood.  Advised daughter-in-law to encourage patient on elevating feet and watching sodium intake.  Advised that I would route message to Dr. Martinique and Malachy Mood.

## 2021-04-25 NOTE — Progress Notes (Signed)
Jordan [name] Date of Birth: 1917/09/23   History of Present Illness: Jordan [name] is seen today for evaluation of LE edema.   He has a history of coronary disease with remote stenting of the LAD in 1999. Cardiac catheterization January 2013 showed nonobstructive disease. He has a history of bradycardia requiring pacemaker implant. He has a history of severe aortic stenosis. He underwent TAVR on 05/15/15.  Follow up Echo in September 2017 looked good.   He has a 4.6 cm abdominal aortic aneurysm followed  by [facility]- last checked in October 2019. Stable at 4.7 cm.  Not a candidate for stent grafting.   He was admitted in January 2021 with  a fall. Had post concussion confusion. Son thinks he may have fallen when he got up to urinate at night without the light on. Was orthostatic. Troponin elevated 2562> 2467. Seen by Dr Lovena Le. No clear evidence of ACS by history. Echo was OK and pacemaker checked out. CT of head without acute change. Felt he had some vagal response related to abdominal pain.  He was seen this spring with increased LE edema. Doppler was negative for DVT.  Was placed on lasix 20 mg daily and later switched to torsemide 10 mg daily. This was increased to 20 mg daily on his last visit. He is wearing compression hose still.  He has completed RT for scalp cancer.   He underwent pacemaker generator change out on May 22.   He was seen in the ED in June with urinary retention.   More recently he developed significant swelling and redness of his right foot. Stood up to go to bathroom and felt something pop. Then foot was swollen. Went to [facility] but waited 5 hours without being seen. States since then swelling has gone down and redness resolved. Taking torsemide 20 mg daily. Drinks a lot of water and eats occasional. Salty snacks.    Current Outpatient Medications on File Prior to Visit  Medication Sig Dispense Refill   acetaminophen (TYLENOL) 325 MG tablet Take 650 mg  by mouth every 6 (six) hours as needed for mild pain, moderate pain or fever.      amLODipine (NORVASC) 5 MG tablet TAKE 1 TABLET BY MOUTH EVERY DAY. NEED OFFICE VISIT 90 tablet 2   atorvastatin (LIPITOR) 10 MG tablet TAKE 1 TABLET BY MOUTH EVERY DAY (Patient taking differently: Take 10 mg by mouth at bedtime.) 90 tablet 3   clopidogrel (PLAVIX) 75 MG tablet Take 1 tablet (75 mg total) by mouth daily. (Patient taking differently: Take 75 mg by mouth in the morning.) 90 tablet 3   NEXIUM 24HR 20 MG capsule TAKE 1 CAPSULE BY MOUTH DAILY AT 12 NOON**PLEASE SCHEDULE AN APPT FOR FUTURE REFILLS (Patient taking differently: Take 20 mg by mouth daily at 12 noon.) 90 capsule 4   silodosin (RAPAFLO) 4 MG CAPS capsule Take 4 mg by mouth at bedtime.     torsemide (DEMADEX) 20 MG tablet Take 1 tablet (20 mg total) by mouth daily. 90 tablet 3   No current facility-administered medications on file prior to visit.    Allergies  Allergen Reactions   Aspirin Other (See Comments)    Stomach bleeds   Nitroglycerin     Makes patient faint    Past Medical History:  Diagnosis Date   AAA (abdominal aortic aneurysm) (HCC)    Anemia    Aortic stenosis    Arthritis 09/18/11   "in my knees"  Basal cell carcinoma of skin    Blood transfusion    BPH (benign prostatic hyperplasia)    CAD (coronary artery disease)    Remote stent to LAD in 1999. Last cath in 2004 and he is managed medically   Colon polyps    Complication of anesthesia    Dysrhythmia    paced   GERD (gastroesophageal reflux disease)    Hemorrhoid    History of radiation therapy 05/23/20-06/19/20   Radiation to scalp and RUE , Dr. 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   26 mm Edwards Sapien 3 transcatheter heart valve placed via open left transfemoral approach    Past  Surgical History:  Procedure Laterality Date   CARDIAC CATHETERIZATION  2004   Managed medically   CARDIAC CATHETERIZATION     CARDIAC CATHETERIZATION N/A 04/11/2015   Procedure: Right/Left Heart Cath and Coronary Angiography;  Surgeon: Burnell Blanks, MD;  Location: [facility];  Service: Cardiovascular;  Laterality: N/A;   CORONARY ANGIOGRAM  09/19/2011   Procedure: CORONARY ANGIOGRAM;  Surgeon: Josue Hector, MD;  Location: [facility] C[facility];  Service: Cardiovascular;;   CORONARY ANGIOPLASTY WITH STENT PLACEMENT  05/09/1998   "1"; LAD   INSERT / REPLACE / REMOVE PACEMAKER  2004   initial placement; Medtronic   INSERT / REPLACE / REMOVE PACEMAKER  02/14/2011   NASAL HEMORRHAGE CONTROL  1980's   Clips placed to stop bleeding   PPM GENERATOR CHANGEOUT N/A 02/01/2021   Procedure: [place];  Surgeon: Evans Lance, MD;  Location: [facility];  Service: Cardiovascular;  Laterality: N/A;   SKIN CANCER EXCISION  09/18/11   "have had a couple hundred of them removed since I was in my 26's"   TEE WITHOUT CARDIOVERSION N/A 05/15/2015   Procedure: TRANSESOPHAGEAL ECHOCARDIOGRAM (TEE);  Surgeon: Burnell Blanks, MD;  Location: [facility];  Service: Open Heart Surgery;  Laterality: N/A;   TONSILLECTOMY     "when I was a teenager"   TRANSCATHETER AORTIC VALVE REPLACEMENT, TRANSFEMORAL N/A 05/15/2015   Procedure: TRANSCATHETER AORTIC VALVE REPLACEMENT, TRANSFEMORAL;  Surgeon: Burnell Blanks, MD;  Location: [facility];  Service: Open Heart Surgery;  Laterality: N/A;    Social History   Tobacco Use  Smoking Status Former   Packs/day: 1.00   Years: 42.00   Pack years: 42.00   Types: Cigarettes   Quit date: 08/18/1974   Years since quitting: 46.7  Smokeless Tobacco Former   Types: Chew    Social History   Substance and Sexual Activity  Alcohol Use No   Alcohol/week: 0.0 standard drinks    Family History  Problem Relation Age of Onset   Cancer Father         stomach   Colon cancer Father    Heart disease Brother    Heart disease Brother    Diabetes Other        granddaughter    Review of Systems: As noted in history of present illness. All other systems were reviewed and are negative.  Physical Exam: BP 132/69   Pulse 74   Ht '5\' 2"'$  (1.575 m)   Wt 151 lb 3.2 oz (68.6 kg)   SpO2 97%   BMI 27.65 kg/m  GENERAL:  Well appearing elderly WM in NAD HEENT:  PERRL, EOMI, sclera are clear. Oropharynx is clear. NECK:  No jugular venous distention, carotid upstroke brisk and symmetric, no bruits, no thyromegaly or adenopathy LUNGS:  clear CHEST:  Unremarkable HEART:  RRR,  PMI not displaced or sustained,S1 and S2 within normal limits, no S3, no S4: no clicks, no rubs, gr 2/6 systolic murmur. ABD:  Soft, nontender. BS +, no masses or bruits. No hepatomegaly, no splenomegaly EXT:  Pedal pulses are palpable, 1+ bilateral pedal edema. No increased redness or warmth. SKIN:  Warm and dry.  No rashes NEURO:  Alert and oriented x 3. Cranial nerves II through XII intact. PSYCH:  Cognitively intact    LABORATORY DATA: Lab Results  Component Value Date   WBC 7.9 02/20/2021   HGB 10.9 (L) 02/20/2021   HCT 35.1 (L) 02/20/2021   PLT 211 02/20/2021   GLUCOSE 109 (H) 02/20/2021   CHOL 140 11/02/2020   TRIG 211 (H) 11/02/2020   HDL 36 (L) 11/02/2020   LDLCALC 69 11/02/2020   ALT 11 11/02/2020   AST 16 11/02/2020   NA 138 02/20/2021   K 4.0 02/20/2021   CL 98 02/20/2021   CREATININE 1.45 (H) 02/20/2021   BUN 31 (H) 02/20/2021   CO2 30 02/20/2021   TSH 0.797 09/18/2011   INR 1.1 09/24/2019   HGBA1C 5.5 05/11/2015   Labs dated 07/05/18: Normal CBC and CMET. Cholesterol 120, triglycerides 131, HDL 36, LDL 58.   Echo: 05/28/16:  Study Conclusions   - Left ventricle: The cavity size was mildly dilated. Wall   thickness was increased in a pattern of moderate LVH. Systolic   function was normal. The estimated ejection fraction was in the    range of 60% to 65%. Doppler parameters are consistent with   abnormal left ventricular relaxation (grade 1 diastolic   dysfunction). - Aortic valve: 26 mm Sapien 3 valve in excellent position with no   perivalvular regurgitation and stable gradients. - Mitral valve: Calcified annulus. Moderately thickened leaflets .   There was mild regurgitation. Valve area by pressure half-time:   2.5 cm^2. - Left atrium: The atrium was moderately dilated. - Atrial septum: No defect or patent foramen ovale was identified.   Echo 09/25/19:IMPRESSIONS     1. Left ventricular ejection fraction, by visual estimation, is 60 to  65%. The left ventricle has normal function. There is mildly increased  left ventricular hypertrophy.   2. Elevated left atrial pressure.   3. Left ventricular diastolic parameters are consistent with Grade I  diastolic dysfunction (impaired relaxation).   4. The left ventricle has no regional wall motion abnormalities.   5. Global right ventricle has normal systolic function.The right  ventricular size is normal.   6. Left atrial size was mildly dilated.   7. Right atrial size was normal.   8. Severe mitral annular calcification.   9. The mitral valve is normal in structure. Mild mitral valve  regurgitation. No evidence of mitral stenosis.  10. The tricuspid valve is normal in structure.  11. The aortic valve is normal in structure. Aortic valve regurgitation is  not visualized. No evidence of aortic valve sclerosis or stenosis.  12. The pulmonic valve was normal in structure. Pulmonic valve  regurgitation is not visualized.  13. The inferior vena cava is normal in size with greater than 50%  respiratory variability, suggesting right atrial pressure of 3 mmHg.  14. Normal LV systolic function; mild LVH; grade 1 diastolic  dysfunction;  s/p TAVR with mean gradient 10 mmHg; AVA 1.8 cm2) and no AI; mild LAE;  mild MR.   Assessment / Plan: 1. Coronary disease with remote  stenting of the LAD in 1999. Cardiac catheterization Jan 2013 showed nonobstructive disease. He is asymptomatic. We will continue with Plavix and statin therapy.  2. Severe aortic stenosis.s/p TAVR in August 2016. Excellent result. Follow up Echo in January 2021 looked good. Exam is stable.    3. Sinus node dysfunction with bradycardia. Status post pacemaker implant. Pacemaker followup is satisfactory. May need generator change out within the next year  4. Hyperlipidemia. On chronic Crestor therapy. LDL 69.  5. AAA 4.7 cm. Followed by [facility]. Not a candidate for stent grafting. He is a poor candidate for open repair. Last [place] October 2019 was stable. Since he is not a candidate for repair we are not really following at this point.   6. Renal mass. Followed by urology. Patient reports this is stable/smaller.   7. Edema. Due to venous insufficiency. 1+ pedal edema today. No evidence of cellulitis or gout. Reinforced importance of sodium restriction, fluid restriction. Will continue current diuretic dose.   I will follow up in 3 months.

## 2021-05-01 ENCOUNTER — Ambulatory Visit: Payer: Medicare Other | Admitting: Cardiology

## 2021-05-01 ENCOUNTER — Other Ambulatory Visit: Payer: Self-pay

## 2021-05-01 ENCOUNTER — Encounter: Payer: Self-pay | Admitting: Cardiology

## 2021-05-01 VITALS — BP 132/69 | HR 74 | Ht 62.0 in | Wt 151.2 lb

## 2021-05-01 DIAGNOSIS — I2510 Atherosclerotic heart disease of native coronary artery without angina pectoris: Secondary | ICD-10-CM

## 2021-05-01 DIAGNOSIS — I441 Atrioventricular block, second degree: Secondary | ICD-10-CM | POA: Diagnosis not present

## 2021-05-01 DIAGNOSIS — R600 Localized edema: Secondary | ICD-10-CM

## 2021-05-03 ENCOUNTER — Ambulatory Visit (INDEPENDENT_AMBULATORY_CARE_PROVIDER_SITE_OTHER): Payer: Medicare Other

## 2021-05-03 DIAGNOSIS — I441 Atrioventricular block, second degree: Secondary | ICD-10-CM | POA: Diagnosis not present

## 2021-05-05 LAB — CUP PACEART REMOTE DEVICE CHECK
Battery Remaining Longevity: 141 mo
Battery Voltage: 3.2 V
Brady Statistic AP VP Percent: 7.47 %
Brady Statistic AP VS Percent: 0 %
Brady Statistic AS VP Percent: 92.41 %
Brady Statistic AS VS Percent: 0.12 %
Brady Statistic RA Percent Paced: 7.43 %
Brady Statistic RV Percent Paced: 99.88 %
Date Time Interrogation Session: 20220819110736
Implantable Lead Implant Date: 20040811
Implantable Lead Implant Date: 20040811
Implantable Lead Location: 753859
Implantable Lead Location: 753860
Implantable Lead Model: 4469
Implantable Lead Model: 4470
Implantable Lead Serial Number: 421267
Implantable Lead Serial Number: 436022
Implantable Pulse Generator Implant Date: 20220520
Lead Channel Impedance Value: 266 Ohm
Lead Channel Impedance Value: 304 Ohm
Lead Channel Impedance Value: 342 Ohm
Lead Channel Impedance Value: 418 Ohm
Lead Channel Pacing Threshold Amplitude: 0.875 V
Lead Channel Pacing Threshold Amplitude: 0.875 V
Lead Channel Pacing Threshold Pulse Width: 0.4 ms
Lead Channel Pacing Threshold Pulse Width: 0.4 ms
Lead Channel Sensing Intrinsic Amplitude: 0.875 mV
Lead Channel Sensing Intrinsic Amplitude: 0.875 mV
Lead Channel Sensing Intrinsic Amplitude: 14.375 mV
Lead Channel Sensing Intrinsic Amplitude: 14.375 mV
Lead Channel Setting Pacing Amplitude: 1.75 V
Lead Channel Setting Pacing Amplitude: 2 V
Lead Channel Setting Pacing Pulse Width: 0.4 ms
Lead Channel Setting Sensing Sensitivity: 0.9 mV

## 2021-05-08 ENCOUNTER — Encounter: Payer: Medicare Other | Admitting: Internal Medicine

## 2021-05-09 ENCOUNTER — Encounter: Payer: Medicare Other | Admitting: Internal Medicine

## 2021-05-09 ENCOUNTER — Other Ambulatory Visit: Payer: Self-pay | Admitting: Cardiology

## 2021-05-09 NOTE — Telephone Encounter (Signed)
Rx(s) sent to pharmacy electronically.  

## 2021-05-21 NOTE — Progress Notes (Signed)
Remote pacemaker transmission.   

## 2021-06-18 ENCOUNTER — Ambulatory Visit (INDEPENDENT_AMBULATORY_CARE_PROVIDER_SITE_OTHER): Payer: Medicare Other | Admitting: Internal Medicine

## 2021-06-18 ENCOUNTER — Other Ambulatory Visit: Payer: Self-pay

## 2021-06-18 ENCOUNTER — Encounter: Payer: Self-pay | Admitting: Internal Medicine

## 2021-06-18 VITALS — BP 110/50 | HR 72 | Ht 62.0 in | Wt 150.0 lb

## 2021-06-18 DIAGNOSIS — Z950 Presence of cardiac pacemaker: Secondary | ICD-10-CM | POA: Diagnosis not present

## 2021-06-18 DIAGNOSIS — I10 Essential (primary) hypertension: Secondary | ICD-10-CM | POA: Diagnosis not present

## 2021-06-18 DIAGNOSIS — I442 Atrioventricular block, complete: Secondary | ICD-10-CM

## 2021-06-18 NOTE — Progress Notes (Signed)
HPI Jordan [name] returns today for followup. He is a pleasant 87 yo man with aortic stenosis, s/p TAVR, CHB, s/p PPM, dyslipidemia and HTN. Despite his advanced age, he has not been in the hospital. He has dyspnea with exertion but is fairly sedentary. He has not had syncope. No anginal symptoms. He wants to know if I have a pill to make his energy better.  Allergies  Allergen Reactions   Aspirin Other (See Comments)    Stomach bleeds   Nitroglycerin     Makes patient faint     Current Outpatient Medications  Medication Sig Dispense Refill   acetaminophen (TYLENOL) 325 MG tablet Take 650 mg by mouth every 6 (six) hours as needed for mild pain, moderate pain or fever.      amLODipine (NORVASC) 5 MG tablet TAKE 1 TABLET BY MOUTH EVERY DAY. NEED OFFICE VISIT 90 tablet 2   atorvastatin (LIPITOR) 10 MG tablet TAKE 1 TABLET BY MOUTH EVERY DAY 90 tablet 3   clopidogrel (PLAVIX) 75 MG tablet Take 1 tablet (75 mg total) by mouth daily. 90 tablet 3   finasteride (PROSCAR) 5 MG tablet Take 5 mg by mouth daily.     NEXIUM 24HR 20 MG capsule TAKE 1 CAPSULE BY MOUTH DAILY AT 12 NOON**PLEASE SCHEDULE AN APPT FOR FUTURE REFILLS 90 capsule 4   torsemide (DEMADEX) 20 MG tablet Take 1 tablet (20 mg total) by mouth daily. 90 tablet 3   No current facility-administered medications for this visit.     Past Medical History:  Diagnosis Date   AAA (abdominal aortic aneurysm)    Anemia    Aortic stenosis    Arthritis 09/18/11   "in my knees"   Basal cell carcinoma of skin    Blood transfusion    BPH (benign prostatic hyperplasia)    CAD (coronary artery disease)    Remote stent to LAD in 1999. Last cath in 2004 and he is managed medically   Colon polyps    Complication of anesthesia    Dysrhythmia    paced   GERD (gastroesophageal reflux disease)    Hemorrhoid    History of radiation therapy 05/23/20-06/19/20   Radiation to scalp and RUE , Dr. 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   Past Surgical History:  Procedure Laterality Date   CARDIAC CATHETERIZATION  2004   Managed medically   CARDIAC CATHETERIZATION     CARDIAC CATHETERIZATION N/A 04/11/2015   Procedure: Right/Left Heart Cath and Coronary Angiography;  Surgeon: Burnell Blanks, MD;  Location: [facility];  Service: Cardiovascular;  Laterality: N/A;   CORONARY ANGIOGRAM  09/19/2011   Procedure: CORONARY ANGIOGRAM;  Surgeon: Josue Hector, MD;  Location: [facility] C[facility] LAB;  Service: Cardiovascular;;   CORONARY ANGIOPLASTY WITH STENT PLACEMENT  05/09/1998   "1"; LAD   INSERT / REPLACE / REMOVE PACEMAKER  2004   initial placement; [employer]tr[employer]   INSERT / REPLACE / REMOVE PACEMAKER  02/14/2011   NASAL HEMORRHAGE CONTROL  1980's   Clips placed to stop  bleeding   PPM GENERATOR CHANGEOUT N/A 02/01/2021   Procedure: PPM GENERATOR CHANGEOUT;  Surgeon: Evans Lance, MD;  Location: [facility];  Service: Cardiovascular;  Laterality: N/A;   SKIN CANCER EXCISION  09/18/11   "have had a couple hundred of them removed since I was in my 44's"   TEE WITHOUT CARDIOVERSION N/A 05/15/2015   Procedure: TRANSESOPHAGEAL ECHOCARDIOGRAM (TEE);  Surgeon: Burnell Blanks, MD;  Location: [facility];  Service: Open Heart Surgery;  Laterality: N/A;   TONSILLECTOMY     "when I was a teenager"   TRANSCATHETER AORTIC VALVE REPLACEMENT, TRANSFEMORAL N/A 05/15/2015   Procedure: TRANSCATHETER AORTIC VALVE REPLACEMENT, TRANSFEMORAL;  Surgeon: Burnell Blanks, MD;  Location: [facility];  Service: Open Heart Surgery;  Laterality: N/A;     Family History  Problem Relation Age of Onset    Cancer Father        stomach   Colon cancer Father    Heart disease Brother    Heart disease Brother    Diabetes Other        granddaughter     Social History   Socioeconomic History   Marital status: Widowed    Spouse name: Not on file   Number of children: 2   Years of education: Not on file   Highest education level: Not on file  Occupational History   Occupation: Retired    Comment: welder  Tobacco Use   Smoking status: Former    Packs/day: 1.00    Years: 42.00    Pack years: 42.00    Types: Cigarettes    Quit date: 08/18/1974    Years since quitting: 46.8   Smokeless tobacco: Former    Types: Nurse, children's Use: Never used  Substance and Sexual Activity   Alcohol use: No    Alcohol/week: 0.0 standard drinks   Drug use: No   Sexual activity: Not Currently  Other Topics Concern   Not on file  Social History Narrative   Not on file   Social Determinants of Health   Financial Resource Strain: Not on file  Food Insecurity: Not on file  Transportation Needs: Not on file  Physical Activity: Not on file  Stress: Not on file  Social Connections: Not on file  Intimate Partner Violence: Not on file     BP (!) 110/50 (BP Location: Left Arm, Patient Position: Sitting, Cuff Size: Normal)   Pulse 72   Ht 5\' 2"  (1.575 m)   Wt 150 lb (68 kg)   SpO2 96%   BMI 27.44 kg/m   Physical Exam:  pleasant appearing  elderly man, NAD HEENT: Unremarkable Neck:  6 cm JVD, no thyromegally Lymphatics:  No adenopathy Back:  No CVA tenderness Lungs:  Clear with no wheezes HEART:  Regular rate rhythm, no murmurs, no rubs, no clicks Abd:  soft, positive bowel sounds, no organomegally, no rebound, no guarding Ext:  2 plus pulses, trace peripheral edema, no cyanosis, no clubbing Skin:  No rashes no nodules Neuro:  CN II through XII intact, motor grossly intact  EKG - nsr  with ventricular pacing  DEVICE  Normal device function.  See PaceArt for details.    Assess/Plan:   1. CHB - he is asymptomatic, s/p PPM insertion 2. AS - he is doing well, s/p TAVR. 3. HTN - her bp is well controlled. We will follow. If he develops worsening peripheral edema, would stop amlodipine.  4. PPM -he is  s/p PM gen change out 5 months ago and his incision appears well healed. We will recheck in several months.   Jordan [name] Jordan Azer,MD

## 2021-06-18 NOTE — Patient Instructions (Signed)
Medication Instructions:  Your physician recommends that you continue on your current medications as directed. Please refer to the Current Medication list given to you today.  Labwork: None ordered.  Testing/Procedures: None ordered.  Follow-Up: Your physician wants you to follow-up in: one year with Cristopher Peru, MD or one of the following Advanced Practice Providers on your designated Care Team:   Tommye Standard, [place] Legrand Como "Jonni Sanger" Chalmers Cater, [place]  Remote monitoring is used to monitor your Pacemaker from home. This monitoring reduces the number of office visits required to check your device to one time per year. It allows [place] to keep an eye on the functioning of your device to ensure it is working properly. You are scheduled for a device check from home on 08/02/2021. You may send your transmission at any time that day. If you have a wireless device, the transmission will be sent automatically. After your physician reviews your transmission, you will receive a postcard with your next transmission date.  Any Other Special Instructions Will Be Listed Below (If Applicable).  If you need a refill on your cardiac medications before your next appointment, please call your pharmacy.

## 2021-06-22 ENCOUNTER — Encounter (HOSPITAL_COMMUNITY): Payer: Self-pay | Admitting: Emergency Medicine

## 2021-06-22 ENCOUNTER — Emergency Department (HOSPITAL_COMMUNITY)
Admission: EM | Admit: 2021-06-22 | Discharge: 2021-06-22 | Disposition: A | Discharge status: Home / Self Care | Payer: Medicare Other | Source: Home / Self Care | Attending: Emergency Medicine | Admitting: Emergency Medicine

## 2021-06-22 ENCOUNTER — Emergency Department (HOSPITAL_COMMUNITY): Payer: Medicare Other

## 2021-06-22 ENCOUNTER — Other Ambulatory Visit: Payer: Self-pay

## 2021-06-22 ENCOUNTER — Inpatient Hospital Stay (HOSPITAL_COMMUNITY)
Admission: EM | Admit: 2021-06-22 | Discharge: 2021-06-29 | DRG: 699 | Disposition: A | Discharge status: Home Health | Payer: Medicare Other | Attending: Internal Medicine | Admitting: Internal Medicine

## 2021-06-22 DIAGNOSIS — Z79899 Other long term (current) drug therapy: Secondary | ICD-10-CM | POA: Insufficient documentation

## 2021-06-22 DIAGNOSIS — I2510 Atherosclerotic heart disease of native coronary artery without angina pectoris: Secondary | ICD-10-CM | POA: Insufficient documentation

## 2021-06-22 DIAGNOSIS — T83091A Other mechanical complication of indwelling urethral catheter, initial encounter: Secondary | ICD-10-CM | POA: Insufficient documentation

## 2021-06-22 DIAGNOSIS — D62 Acute posthemorrhagic anemia: Secondary | ICD-10-CM | POA: Diagnosis present

## 2021-06-22 DIAGNOSIS — N320 Bladder-neck obstruction: Secondary | ICD-10-CM | POA: Diagnosis present

## 2021-06-22 DIAGNOSIS — H353131 Nonexudative age-related macular degeneration, bilateral, early dry stage: Secondary | ICD-10-CM | POA: Diagnosis present

## 2021-06-22 DIAGNOSIS — R310 Gross hematuria: Secondary | ICD-10-CM | POA: Diagnosis not present

## 2021-06-22 DIAGNOSIS — Z7902 Long term (current) use of antithrombotics/antiplatelets: Secondary | ICD-10-CM

## 2021-06-22 DIAGNOSIS — Z87891 Personal history of nicotine dependence: Secondary | ICD-10-CM | POA: Insufficient documentation

## 2021-06-22 DIAGNOSIS — K5900 Constipation, unspecified: Secondary | ICD-10-CM | POA: Diagnosis present

## 2021-06-22 DIAGNOSIS — Z20822 Contact with and (suspected) exposure to covid-19: Secondary | ICD-10-CM | POA: Diagnosis present

## 2021-06-22 DIAGNOSIS — Z85828 Personal history of other malignant neoplasm of skin: Secondary | ICD-10-CM | POA: Insufficient documentation

## 2021-06-22 DIAGNOSIS — N1831 Chronic kidney disease, stage 3a: Secondary | ICD-10-CM | POA: Diagnosis present

## 2021-06-22 DIAGNOSIS — D638 Anemia in other chronic diseases classified elsewhere: Secondary | ICD-10-CM | POA: Diagnosis present

## 2021-06-22 DIAGNOSIS — Z888 Allergy status to other drugs, medicaments and biological substances status: Secondary | ICD-10-CM

## 2021-06-22 DIAGNOSIS — N138 Other obstructive and reflux uropathy: Secondary | ICD-10-CM | POA: Diagnosis present

## 2021-06-22 DIAGNOSIS — Y846 Urinary catheterization as the cause of abnormal reaction of the patient, or of later complication, without mention of misadventure at the time of the procedure: Secondary | ICD-10-CM | POA: Insufficient documentation

## 2021-06-22 DIAGNOSIS — R339 Retention of urine, unspecified: Secondary | ICD-10-CM | POA: Insufficient documentation

## 2021-06-22 DIAGNOSIS — L8990 Pressure ulcer of unspecified site, unspecified stage: Secondary | ICD-10-CM | POA: Insufficient documentation

## 2021-06-22 DIAGNOSIS — D649 Anemia, unspecified: Secondary | ICD-10-CM | POA: Diagnosis present

## 2021-06-22 DIAGNOSIS — I129 Hypertensive chronic kidney disease with stage 1 through stage 4 chronic kidney disease, or unspecified chronic kidney disease: Secondary | ICD-10-CM | POA: Diagnosis present

## 2021-06-22 DIAGNOSIS — Z23 Encounter for immunization: Secondary | ICD-10-CM | POA: Diagnosis present

## 2021-06-22 DIAGNOSIS — Z953 Presence of xenogenic heart valve: Secondary | ICD-10-CM

## 2021-06-22 DIAGNOSIS — I714 Abdominal aortic aneurysm, without rupture, unspecified: Secondary | ICD-10-CM | POA: Diagnosis not present

## 2021-06-22 DIAGNOSIS — Z955 Presence of coronary angioplasty implant and graft: Secondary | ICD-10-CM

## 2021-06-22 DIAGNOSIS — Z886 Allergy status to analgesic agent status: Secondary | ICD-10-CM

## 2021-06-22 DIAGNOSIS — N401 Enlarged prostate with lower urinary tract symptoms: Secondary | ICD-10-CM | POA: Diagnosis present

## 2021-06-22 DIAGNOSIS — N183 Chronic kidney disease, stage 3 unspecified: Secondary | ICD-10-CM | POA: Diagnosis present

## 2021-06-22 DIAGNOSIS — Z950 Presence of cardiac pacemaker: Secondary | ICD-10-CM

## 2021-06-22 DIAGNOSIS — I442 Atrioventricular block, complete: Secondary | ICD-10-CM | POA: Diagnosis present

## 2021-06-22 DIAGNOSIS — I10 Essential (primary) hypertension: Secondary | ICD-10-CM | POA: Insufficient documentation

## 2021-06-22 DIAGNOSIS — E782 Mixed hyperlipidemia: Secondary | ICD-10-CM

## 2021-06-22 DIAGNOSIS — M1990 Unspecified osteoarthritis, unspecified site: Secondary | ICD-10-CM | POA: Diagnosis present

## 2021-06-22 DIAGNOSIS — R5381 Other malaise: Secondary | ICD-10-CM | POA: Diagnosis present

## 2021-06-22 DIAGNOSIS — Y738 Miscellaneous gastroenterology and urology devices associated with adverse incidents, not elsewhere classified: Secondary | ICD-10-CM | POA: Diagnosis present

## 2021-06-22 DIAGNOSIS — Z952 Presence of prosthetic heart valve: Secondary | ICD-10-CM

## 2021-06-22 DIAGNOSIS — K219 Gastro-esophageal reflux disease without esophagitis: Secondary | ICD-10-CM | POA: Diagnosis present

## 2021-06-22 DIAGNOSIS — Z923 Personal history of irradiation: Secondary | ICD-10-CM

## 2021-06-22 LAB — BASIC METABOLIC PANEL
Anion gap: 7 (ref 5–15)
BUN: 34 mg/dL — ABNORMAL HIGH (ref 8–23)
CO2: 28 mmol/L (ref 22–32)
Calcium: 8.4 mg/dL — ABNORMAL LOW (ref 8.9–10.3)
Chloride: 101 mmol/L (ref 98–111)
Creatinine, Ser: 1.66 mg/dL — ABNORMAL HIGH (ref 0.61–1.24)
GFR, Estimated: 37 mL/min — ABNORMAL LOW (ref >60)
Glucose, Bld: 116 mg/dL — ABNORMAL HIGH (ref 70–99)
Potassium: 4 mmol/L (ref 3.5–5.1)
Sodium: 136 mmol/L (ref 135–145)

## 2021-06-22 LAB — CBC WITH DIFFERENTIAL/PLATELET
Abs Immature Granulocytes: 0.04 10*3/uL (ref 0.00–0.07)
Basophils Absolute: 0 10*3/uL (ref 0.0–0.1)
Basophils Relative: 0 %
Eosinophils Absolute: 0 10*3/uL (ref 0.0–0.5)
Eosinophils Relative: 1 %
HCT: 20.5 % — ABNORMAL LOW (ref 39.0–52.0)
Hemoglobin: 6.2 g/dL — CL (ref 13.0–17.0)
Immature Granulocytes: 1 %
Lymphocytes Relative: 20 %
Lymphs Abs: 1.6 10*3/uL (ref 0.7–4.0)
MCH: 25.6 pg — ABNORMAL LOW (ref 26.0–34.0)
MCHC: 30.2 g/dL (ref 30.0–36.0)
MCV: 84.7 fL (ref 80.0–100.0)
Monocytes Absolute: 0.9 10*3/uL (ref 0.1–1.0)
Monocytes Relative: 11 %
Neutro Abs: 5.7 10*3/uL (ref 1.7–7.7)
Neutrophils Relative %: 67 %
Platelets: 218 10*3/uL (ref 150–400)
RBC: 2.42 MIL/uL — ABNORMAL LOW (ref 4.22–5.81)
RDW: 15.1 % (ref 11.5–15.5)
WBC: 8.3 10*3/uL (ref 4.0–10.5)
nRBC: 0 % (ref 0.0–0.2)

## 2021-06-22 LAB — ABO/RH: ABO/RH(D): O NEG

## 2021-06-22 LAB — PROTIME-INR
INR: 1.1 (ref 0.8–1.2)
Prothrombin Time: 14.4 seconds (ref 11.4–15.2)

## 2021-06-22 LAB — RESP PANEL BY RT-PCR (FLU A&B, COVID) ARPGX2
Influenza A by PCR: NEGATIVE
Influenza B by PCR: NEGATIVE
SARS Coronavirus 2 by RT PCR: NEGATIVE

## 2021-06-22 LAB — PREPARE RBC (CROSSMATCH)

## 2021-06-22 MED ORDER — ATORVASTATIN CALCIUM 10 MG PO TABS
10.0000 mg | ORAL_TABLET | Freq: Every evening | ORAL | Status: DC
  Administered 2021-06-23 – 2021-06-28 (×6): 10 mg via ORAL
  Filled 2021-06-22 (×6): qty 1

## 2021-06-22 MED ORDER — SODIUM CHLORIDE 0.9 % IR SOLN
3000.0000 mL | Status: DC
  Administered 2021-06-24: 3000 mL

## 2021-06-22 MED ORDER — SODIUM CHLORIDE 0.9 % IV BOLUS
500.0000 mL | Freq: Once | INTRAVENOUS | Status: AC
  Administered 2021-06-22: 500 mL via INTRAVENOUS

## 2021-06-22 MED ORDER — FINASTERIDE 5 MG PO TABS
5.0000 mg | ORAL_TABLET | Freq: Every evening | ORAL | Status: DC
  Administered 2021-06-23 – 2021-06-28 (×6): 5 mg via ORAL
  Filled 2021-06-22 (×6): qty 1

## 2021-06-22 MED ORDER — ONDANSETRON HCL 4 MG/2ML IJ SOLN: 4.0000 mg | Freq: Four times a day (QID) | INTRAMUSCULAR | Status: DC | PRN

## 2021-06-22 MED ORDER — AMLODIPINE BESYLATE 5 MG PO TABS
5.0000 mg | ORAL_TABLET | Freq: Every morning | ORAL | Status: DC
  Administered 2021-06-23 – 2021-06-29 (×7): 5 mg via ORAL
  Filled 2021-06-22 (×7): qty 1

## 2021-06-22 MED ORDER — SODIUM CHLORIDE 0.9 % IV SOLN
10.0000 mL/h | Freq: Once | INTRAVENOUS | Status: AC
  Administered 2021-06-23: 10 mL/h via INTRAVENOUS

## 2021-06-22 MED ORDER — PANTOPRAZOLE SODIUM 40 MG PO TBEC
40.0000 mg | DELAYED_RELEASE_TABLET | Freq: Every day | ORAL | Status: DC
  Administered 2021-06-23 – 2021-06-29 (×7): 40 mg via ORAL
  Filled 2021-06-22 (×7): qty 1

## 2021-06-22 MED ORDER — ONDANSETRON HCL 4 MG PO TABS: 4.0000 mg | ORAL_TABLET | Freq: Four times a day (QID) | ORAL | Status: DC | PRN

## 2021-06-22 MED ORDER — ACETAMINOPHEN 325 MG PO TABS
650.0000 mg | ORAL_TABLET | Freq: Four times a day (QID) | ORAL | Status: DC | PRN
  Administered 2021-06-24: 325 mg via ORAL
  Administered 2021-06-25 – 2021-06-28 (×4): 650 mg via ORAL
  Filled 2021-06-22 (×7): qty 2

## 2021-06-22 NOTE — ED Notes (Signed)
Patient and his son educated on how to maintain clean, sterile catheter care by this RN. Educated on monitoring output from the foley and to seek medical attention if the foley does not drain any more.

## 2021-06-22 NOTE — ED Provider Notes (Signed)
Emergency Medicine Provider Triage Evaluation Note  Jordan [name] , a 87 y.o. male  was evaluated in triage.  Pt complains of hematuria.  Review of Systems  Positive: Mild abd discomfort, hematuria Negative: Fever, trauma  Physical Exam  BP 95/69   Pulse 97   Temp 98.2 F (36.8 C) (Oral)   Resp 16   SpO2 97%  Gen:   Awake, no distress   Resp:  Normal effort  MSK:   Moves extremities without difficulty  Other:  Foley bag with frank blood  Medical Decision Making  Medically screening exam initiated at 7:51 PM.  Appropriate orders placed.  ORRIN YURKOVICH was informed that the remainder of the evaluation will be completed by another provider, this initial triage assessment does not replace that evaluation, and the importance of remaining in the ED until their evaluation is complete.  Pt with hx of BPH and urinary retention who has his foley replaced yesterday at [facility].  Has had persistent hematuria throughout the day which concerns his son.  Was on Plavix but stopped taking today.    Domenic Moras, PA-C 06/22/21 1953    Margette Fast, MD 06/24/21 [phone]

## 2021-06-22 NOTE — ED Provider Notes (Signed)
Emergency Department Provider Note   I have reviewed the triage vital signs and the nursing notes.   HISTORY  Chief Complaint Hematuria   HPI Jordan [name] is a 87 y.o. male with past medical history reviewed below returns to the emergency department with hematuria.  His catheter is flowing but his son, at bedside, states that his urine has been a dark red color and seems to be getting worse.  He was seen in the emergency department earlier this morning with a similar presentation but had more obstruction symptoms at that time.  After irrigation, the Foley catheter was working again and he was discharged home.  The Foley catheter has been required for the patient since June of this year.  Prior to this morning's change it was changed yesterday at [facility] urology with Dr. Jeffie Pollock.  He was instructed at that time to hold his Plavix for 5 days with some blood noted at that time.  He has not taken Plavix in the last 24 hours.  No fevers or chills.  Patient has some pain when he feels like he needs to urinate but otherwise is feeling okay.  The patient's son states that he has not been eating or drinking as much today and seeming somewhat fatigued. No AMS.    Past Medical History:  Diagnosis Date   AAA (abdominal aortic aneurysm)    Anemia    Aortic stenosis    Arthritis 09/18/11   "in my knees"   Basal cell carcinoma of skin    Blood transfusion    BPH (benign prostatic hyperplasia)    CAD (coronary artery disease)    Remote stent to LAD in 1999. Last cath in 2004 and he is managed medically   Colon polyps    Complication of anesthesia    Dysrhythmia    paced   GERD (gastroesophageal reflux disease)    Hemorrhoid    History of radiation therapy 05/23/20-06/19/20   Radiation to scalp and RUE , Dr. 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   26 mm Edwards Sapien 3 transcatheter heart valve placed via open left transfemoral approach    Patient Active Problem List   Diagnosis Date Noted   Complete heart block ([place]) 09/21/2020   Hypertension 09/21/2020   Squamous cell carcinoma of skin of scalp 05/09/2020   Squamous cell cancer of multiple sites of skin of upper arm, right 05/09/2020   Cystoid macular edema of right eye 02/20/2020   Early stage nonexudative age-related macular degeneration of both eyes 02/20/2020   Macular pucker, right eye 02/20/2020   Syncope 09/24/2019   AAA (abdominal aortic aneurysm) without rupture ([place]) 08/14/2015   Severe aortic valve stenosis 05/15/2015   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   Aortic stenosis, severe 05/03/2015   Chest pain 03/09/2015   Abdominal aneurysm without mention of rupture 02/24/2013   Pulmonary nodule, right 02/10/2013   Mobitz type II atrioventricular block 03/09/2012   Effusion of olecranon bursa, left 01/28/2012   Aortic stenosis 05/06/2011   Cardiac pacemaker in situ 10/29/2010   ABDOMINAL PAIN-LUQ 06/21/2008   PERSONAL HX COLONIC POLYPS 06/21/2008   HYPERLIPIDEMIA 06/19/2008   ANEMIA 06/19/2008   Coronary atherosclerosis 06/19/2008    Past Surgical History:  Procedure  Laterality Date   CARDIAC CATHETERIZATION  2004   Managed medically   CARDIAC CATHETERIZATION     CARDIAC CATHETERIZATION N/A 04/11/2015   Procedure: Right/Left Heart Cath and Coronary Angiography;  Surgeon: Burnell Blanks, MD;  Location: [place] CV LAB;  Service: Cardiovascular;  Laterality: N/A;   CORONARY ANGIOGRAM  09/19/2011   Procedure: CORONARY ANGIOGRAM;  Surgeon: Josue Hector, MD;  Location: [facility] CATH LAB;  Service: Cardiovascular;;   CORONARY ANGIOPLASTY WITH STENT PLACEMENT  05/09/1998   "1"; LAD   INSERT / REPLACE / REMOVE PACEMAKER  2004   initial placement; [employer]   INSERT / REPLACE / REMOVE PACEMAKER  02/14/2011    NASAL HEMORRHAGE CONTROL  1980's   Clips placed to stop bleeding   PPM GENERATOR CHANGEOUT N/A 02/01/2021   Procedure: [place];  Surgeon: Evans Lance, MD;  Location: [place] CV LAB;  Service: Cardiovascular;  Laterality: N/A;   SKIN CANCER EXCISION  09/18/11   "have had a couple hundred of them removed since I was in my 30's"   TEE WITHOUT CARDIOVERSION N/A 05/15/2015   Procedure: TRANSESOPHAGEAL ECHOCARDIOGRAM (TEE);  Surgeon: Burnell Blanks, MD;  Location: [place];  Service: Open Heart Surgery;  Laterality: N/A;   TONSILLECTOMY     "when I was a teenager"   TRANSCATHETER AORTIC VALVE REPLACEMENT, TRANSFEMORAL N/A 05/15/2015   Procedure: TRANSCATHETER AORTIC VALVE REPLACEMENT, TRANSFEMORAL;  Surgeon: Burnell Blanks, MD;  Location: [place];  Service: Open Heart Surgery;  Laterality: N/A;    Allergies Aspirin, Tape, and Nitroglycerin  Family History  Problem Relation Age of Onset   Cancer Father        stomach   Colon cancer Father    Heart disease Brother    Heart disease Brother    Diabetes Other        granddaughter    Social History Social History   Tobacco Use   Smoking status: Former    Packs/day: 1.00    Years: 42.00    Pack years: 42.00    Types: Cigarettes    Quit date: 08/18/1974    Years since quitting: 46.8   Smokeless tobacco: Former    Types: Nurse, children's Use: Never used  Substance Use Topics   Alcohol use: No    Alcohol/week: 0.0 standard drinks   Drug use: No    Review of Systems  Constitutional: No fever/chills Cardiovascular: Denies chest pain. Respiratory: Denies shortness of breath. Gastrointestinal: No abdominal pain.   Genitourinary: Negative for dysuria. Positive hematuria.  Musculoskeletal: Negative for back pain. Skin: Negative for rash. Neurological: Negative for headaches, focal weakness or numbness.  10-point ROS otherwise  negative.  ____________________________________________   PHYSICAL EXAM:  VITAL SIGNS: ED Triage Vitals  Enc Vitals Group     BP 06/22/21 1935 (!) 82/59     Pulse Rate 06/22/21 1934 97     Resp 06/22/21 1938 16     Temp 06/22/21 1938 98.2 F (36.8 C)     Temp Source 06/22/21 1938 Oral     SpO2 06/22/21 1934 97 %   Constitutional: Alert and oriented. Well appearing and in no acute distress. Eyes: Conjunctivae are normal.  Head: Atraumatic. Nose: No congestion/rhinnorhea. Mouth/Throat: Mucous membranes are moist.  Neck: No stridor.   Cardiovascular: Normal rate, regular rhythm. Good peripheral circulation. Grossly normal heart sounds.   Respiratory: Normal respiratory effort.  No retractions. Lungs CTAB. Gastrointestinal: Soft and nontender. No lower abdominal  tenderness. No distention.  Genitourinary: Foley in place with dark red urine flowing from the catheter without clear obstruction.  Musculoskeletal: No lower extremity tenderness nor edema. No gross deformities of extremities. Neurologic:  Normal speech and language. No gross focal neurologic deficits are appreciated.  Skin:  Skin is warm, dry and intact. No rash noted.   ____________________________________________   LABS (all labs ordered are listed, but only abnormal results are displayed)  Labs Reviewed  BASIC METABOLIC PANEL - Abnormal; Notable for the following components:      Result Value   Glucose, Bld 116 (*)    BUN 34 (*)    Creatinine, Ser 1.66 (*)    Calcium 8.4 (*)    GFR, Estimated 37 (*)    All other components within normal limits  CBC WITH DIFFERENTIAL/PLATELET - Abnormal; Notable for the following components:   RBC 2.42 (*)    Hemoglobin 6.2 (*)    HCT 20.5 (*)    MCH 25.6 (*)    All other components within normal limits  RESP PANEL BY RT-PCR (FLU A&B, COVID) ARPGX2  PROTIME-INR  TYPE AND SCREEN  PREPARE RBC (CROSSMATCH)   ____________________________________  RADIOLOGY  CT Renal  Stone Study  Result Date: 06/22/2021 CLINICAL DATA:  Flank pain. Kidney stones suspected. Patient was discharged earlier today with a Foley catheter with blood in urine bag. EXAM: CT ABDOMEN AND PELVIS WITHOUT CONTRAST TECHNIQUE: Multidetector CT imaging of the abdomen and pelvis was performed following the standard protocol without IV contrast. COMPARISON:  05/16/2015 FINDINGS: Lower chest: Multiple pulmonary nodules are demonstrated. There is a collection of several nodules in the left lingula, largest measuring 1.1 x 2.1 cm diameter. Another nodule is demonstrated in the right costophrenic angle measuring 1 cm diameter. There is also a calcified nodule in the right lung base. Atelectasis in the lower lungs. Cardiac enlargement. Postoperative cardiac valve repairs. Hepatobiliary: No focal liver lesions identified. Cholelithiasis with small layering stones in the gallbladder. No inflammatory changes. No bile duct dilatation superior Pancreas: Unremarkable. No pancreatic ductal dilatation or surrounding inflammatory changes. Spleen: Spleen is atrophic.  No focal lesions. Adrenals/Urinary Tract: No adrenal gland nodules. Bilateral renal parenchymal atrophy. Multiple cysts in the kidneys are similar to prior study. No hydronephrosis or hydroureter. There is a Foley catheter with balloon in the bladder. Gas is present in the bladder consistent with catheterization. Increased density of the bile is consistent with history of hemorrhage. There is diffuse bladder wall thickening with multiple bladder diverticula and cellule formation. This is likely to represent changes due to chronic outlet obstruction. No bladder stones are identified. Stomach/Bowel: Stomach, small bowel, and colon are not abnormally distended. No wall thickening or inflammatory changes are appreciated. Appendix is not identified. Vascular/Lymphatic: Diffuse aortic calcification. Infrarenal abdominal aortic aneurysm measuring 5.4 cm AP diameter. This  is enlarging since the prior study. No retroperitoneal hematoma or lymphadenopathy. Reproductive: Prostate gland is diffusely enlarged. Other: No free air or free fluid in the abdomen. Abdominal wall musculature appears intact. Musculoskeletal: Degenerative changes in the spine and shoulders. No destructive bone lesions. IMPRESSION: 1. Multiple bilateral pulmonary nodules are demonstrated, largest on the left measuring 1.1 x 2.1 cm diameter. Multiple pulmonary nodules. Most severe: 16 mm left solid pulmonary nodule detected on incomplete chest CT. Recommend prompt non-contrast Chest CT for further evaluation. These guidelines do not apply to immunocompromised patients and patients with cancer. Follow up in patients with significant comorbidities as clinically warranted. For lung cancer screening, adhere to Lung-RADS guidelines.  Reference: Radiology. 2017; 284(1):228-43. 2. Infrarenal abdominal aortic aneurysm measuring 5.4 cm, increasing since prior study. 5.4 cm infrarenal abdominal aortic aneurysm. Recommend follow-up every 6 months and vascular consultation. Reference: J Am Coll Radiol 4132;44:010-272. 3. Cholelithiasis without evidence of acute cholecystitis. 4. Foley catheter in the bladder. Increased density of the urine is likely due to hemorrhage. Multiple bladder diverticula and cellule formation most likely representing chronic outlet obstruction. 5. Prostate gland is diffusely enlarged. 6. Bilateral renal atrophy. No hydronephrosis or hydroureter. No obstructing stones identified. Electronically Signed   By: Lucienne Capers M.D.   On: 06/22/2021 21:34    ____________________________________________   PROCEDURES  Procedure(s) performed:   Procedures  CRITICAL CARE Performed by: Margette Fast Total critical care time: 35 minutes Critical care time was exclusive of separately billable procedures and treating other patients. Critical care was necessary to treat or prevent imminent or  life-threatening deterioration. Critical care was time spent personally by me on the following activities: development of treatment plan with patient and/or surrogate as well as nursing, discussions with consultants, evaluation of patient's response to treatment, examination of patient, obtaining history from patient or surrogate, ordering and performing treatments and interventions, ordering and review of laboratory studies, ordering and review of radiographic studies, pulse oximetry and re-evaluation of patient's condition.  Nanda Quinton, MD Emergency Medicine  ____________________________________________   INITIAL IMPRESSION / ASSESSMENT AND PLAN / ED COURSE  Pertinent labs & imaging results that were available during my care of the patient were reviewed by me and considered in my medical decision making (see chart for details).   Patient presents to the emergency department with gross hematuria.  No retention but continues to have dark red, thick appearing urine.  Blood pressures are down compared to vital signs from early this AM. Will try bladder irrigation here to help resolve bleeding. Patient's mental status is WNL. He is in no distress. Will send basic labs and give IVF with soft BP. Will need to check H/H. Plan for CT renal to evaluate for non-bladder source of bleeding such as renal mass, ureteral stone, etc.   Patient did have a single BP reading in the 80s on arrival. This was done through a jacket. Repeat pressures improved.   Spoke with Dr. Jeffie Pollock with Urology. Plan for hand irrigation at the bedside and [facility] admit. Urology to follow. Hold on continuous irrigation for now.   10:40 PM  Spoke with lab and patient's hemoglobin is dropped to 6.2.  Will initiate PRBC transfusion and continue resuscitation.  Patient had improvement in symptoms and urine color after hand irrigation of 1L NS at the beside. No obstruction. Will transfuse and Urology to follow as an inpatient. Continue to hold  Plavix. Discussed with patient and his son (by phone) and they are ok with PRBC transfusion.    Discussed patient's case with [facility] to request admission. Patient and family (if present) updated with plan. Care transferred to [facility] service.  I reviewed all nursing notes, vitals, pertinent old records, EKGs, labs, imaging (as available).  ____________________________________________  FINAL CLINICAL IMPRESSION(S) / ED DIAGNOSES  Final diagnoses:  Symptomatic anemia  Gross hematuria    MEDICATIONS GIVEN DURING THIS VISIT:  Medications  sodium chloride irrigation 0.9 % 3,000 mL (has no administration in time range)  0.9 %  sodium chloride infusion (has no administration in time range)  sodium chloride 0.9 % bolus 500 mL (0 mLs Intravenous Stopped 06/22/21 2223)    Note:  This document was prepared using  [employer] and education [employer] and may include unintentional dictation errors.  Nanda Quinton, MD, [facility] Emergency Medicine    Bria Portales, [facility], MD 06/23/21 [phone]

## 2021-06-22 NOTE — ED Notes (Signed)
ED TO INPATIENT HANDOFF REPORT  Name/Age/Gender Jordan [name] 87 y.o. male  Code Status Code Status History     Date Active Date Inactive Code Status Order ID Comments User Context   09/24/2019 1819 09/25/2019 2242 Full Code [id number]  Delice Bison, DO ED   05/15/2015 1429 05/19/2015 1712 Full Code [id number]  Burnell Blanks, MD Inpatient   04/11/2015 1001 04/11/2015 1859 Full Code [id number]  Burnell Blanks, MD Inpatient   03/09/2015 1921 03/12/2015 1722 Full Code [id number]  Velvet Bathe, MD Inpatient      Questions for Most Recent Historical Code Status (Order [id number])        Home/SNF/Other Home  Chief Complaint Gross hematuria [R31.0]  Level of Care/Admitting Diagnosis ED Disposition     ED Disposition  Admit   Condition  --   Comment  Hospital Area: [facility] [[id number]]  Level of Care: Med-Surg [16]  May admit patient to [facility] or [facility] if equivalent level of care is available:: No  Covid Evaluation: Asymptomatic Screening Protocol (No Symptoms)  Diagnosis: Gross hematuria [599.71.ICD-9-CM]  Admitting Physician: Elwyn Reach [[id number]]  Attending Physician: Elwyn Reach [[id number]]  Estimated length of stay: past midnight tomorrow  Certification:: I certify this patient will need inpatient services for at least 2 midnights          Medical History Past Medical History:  Diagnosis Date   AAA (abdominal aortic aneurysm)    Anemia    Aortic stenosis    Arthritis 09/18/11   "in my knees"   Basal cell carcinoma of skin    Blood transfusion    BPH (benign prostatic hyperplasia)    CAD (coronary artery disease)    Remote stent to LAD in 1999. Last cath in 2004 and he is managed medically   Colon polyps    Complication of anesthesia    Dysrhythmia    paced   GERD (gastroesophageal reflux disease)    Hemorrhoid    History of radiation therapy 05/23/20-06/19/20   Radiation to scalp and RUE , Dr. 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   Nitroglycerin Other (See Comments)    Makes the patient faint    IV Location/Drains/Wounds Patient Lines/Drains/Airways Status     Active Line/Drains/Airways     Name Placement date Placement time Site Days   Peripheral IV 06/22/21 20 G Right Antecubital 06/22/21  2120  Antecubital  less than 1   Urethral Catheter Deven Snyder NT Straight-tip;Latex 16 Fr. 06/22/21  0603  Straight-tip;Latex  less than 1   Incision (Closed) 05/15/15 Groin Left 05/15/15  1150  -- 2230   Incision (Closed) 05/15/15 Groin Right 05/15/15  1150  -- 2230            Labs/Imaging Results for orders placed or performed during the hospital encounter of 06/22/21 (from the past 48 hour(s))  Basic metabolic panel     Status: Abnormal  Collection Time: 06/22/21  9:23 PM  Result Value Ref Range   Sodium 136 135 - 145 mmol/L   Potassium 4.0 3.5 - 5.1 mmol/L   Chloride 101 98 - 111 mmol/L   CO2 28 22 - 32 mmol/L   Glucose, Bld 116 (H) 70 - 99 mg/dL    Comment: Glucose reference range applies only to samples taken after fasting for at least 8 hours.   BUN 34 (H) 8 - 23 mg/dL   Creatinine, Ser 1.66 (H) 0.61 - 1.24 mg/dL   Calcium 8.4 (L) 8.9 - 10.3 mg/dL   GFR, Estimated 37 (L) >60 mL/min    Comment: (NOTE) Calculated using the CKD-EPI Creatinine Equation (2021)    Anion gap 7 5 - 15    Comment: Performed at [facility], [place] [address], [place], [place] [address]  CBC with Differential     Status: Abnormal    Collection Time: 06/22/21  9:23 PM  Result Value Ref Range   WBC 8.3 4.0 - 10.5 K/uL   RBC 2.42 (L) 4.22 - 5.81 MIL/uL   Hemoglobin 6.2 (LL) 13.0 - 17.0 g/dL    Comment: This critical result has verified and been called to LONG,J. MD by SEEL,MOLLY on 10 08 2022 at 2236, and has been read back.    HCT 20.5 (L) 39.0 - 52.0 %   MCV 84.7 80.0 - 100.0 fL   MCH 25.6 (L) 26.0 - 34.0 pg   MCHC 30.2 30.0 - 36.0 g/dL   RDW 15.1 11.5 - 15.5 %   Platelets 218 150 - 400 K/uL   nRBC 0.0 0.0 - 0.2 %   Neutrophils Relative % 67 %   Neutro Abs 5.7 1.7 - 7.7 K/uL   Lymphocytes Relative 20 %   Lymphs Abs 1.6 0.7 - 4.0 K/uL   Monocytes Relative 11 %   Monocytes Absolute 0.9 0.1 - 1.0 K/uL   Eosinophils Relative 1 %   Eosinophils Absolute 0.0 0.0 - 0.5 K/uL   Basophils Relative 0 %   Basophils Absolute 0.0 0.0 - 0.1 K/uL   Immature Granulocytes 1 %   Abs Immature Granulocytes 0.04 0.00 - 0.07 K/uL    Comment: Performed at [facility], [place] [address] [place], [place] [address]  Protime-INR     Status: None   Collection Time: 06/22/21 10:58 PM  Result Value Ref Range   Prothrombin Time 14.4 11.4 - 15.2 seconds   INR 1.1 0.8 - 1.2    Comment: (NOTE) INR goal varies based on device and disease states. Performed at [facility], [place] [address], [place], [place] [address]    CT Renal Stone Study  Result Date: 06/22/2021 CLINICAL DATA:  Flank pain. Kidney stones suspected. Patient was discharged earlier today with a Foley catheter with blood in urine bag. EXAM: CT ABDOMEN AND PELVIS WITHOUT CONTRAST TECHNIQUE: Multidetector CT imaging of the abdomen and pelvis was performed following the standard protocol without IV contrast. COMPARISON:  05/16/2015 FINDINGS: Lower chest: Multiple pulmonary nodules are demonstrated. There is a collection of several nodules in the left lingula, largest measuring 1.1 x 2.1 cm diameter. Another nodule is demonstrated in the right  costophrenic angle measuring 1 cm diameter. There is also a calcified nodule in the right lung base. Atelectasis in the lower lungs. Cardiac enlargement. Postoperative cardiac valve repairs. Hepatobiliary: No focal liver lesions identified. Cholelithiasis with small layering stones in the gallbladder. No inflammatory changes. No bile duct dilatation superior  Pancreas: Unremarkable. No pancreatic ductal dilatation or surrounding inflammatory changes. Spleen: Spleen is atrophic.  No focal lesions. Adrenals/Urinary Tract: No adrenal gland nodules. Bilateral renal parenchymal atrophy. Multiple cysts in the kidneys are similar to prior study. No hydronephrosis or hydroureter. There is a Foley catheter with balloon in the bladder. Gas is present in the bladder consistent with catheterization. Increased density of the bile is consistent with history of hemorrhage. There is diffuse bladder wall thickening with multiple bladder diverticula and cellule formation. This is likely to represent changes due to chronic outlet obstruction. No bladder stones are identified. Stomach/Bowel: Stomach, small bowel, and colon are not abnormally distended. No wall thickening or inflammatory changes are appreciated. Appendix is not identified. Vascular/Lymphatic: Diffuse aortic calcification. Infrarenal abdominal aortic aneurysm measuring 5.4 cm AP diameter. This is enlarging since the prior study. No retroperitoneal hematoma or lymphadenopathy. Reproductive: Prostate gland is diffusely enlarged. Other: No free air or free fluid in the abdomen. Abdominal wall musculature appears intact. Musculoskeletal: Degenerative changes in the spine and shoulders. No destructive bone lesions. IMPRESSION: 1. Multiple bilateral pulmonary nodules are demonstrated, largest on the left measuring 1.1 x 2.1 cm diameter. Multiple pulmonary nodules. Most severe: 16 mm left solid pulmonary nodule detected on incomplete chest CT. Recommend prompt non-contrast Chest  CT for further evaluation. These guidelines do not apply to immunocompromised patients and patients with cancer. Follow up in patients with significant comorbidities as clinically warranted. For lung cancer screening, adhere to Lung-RADS guidelines. Reference: Radiology. 2017; 284(1):228-43. 2. Infrarenal abdominal aortic aneurysm measuring 5.4 cm, increasing since prior study. 5.4 cm infrarenal abdominal aortic aneurysm. Recommend follow-up every 6 months and vascular consultation. Reference: J Am Coll Radiol 3846;65:993-570. 3. Cholelithiasis without evidence of acute cholecystitis. 4. Foley catheter in the bladder. Increased density of the urine is likely due to hemorrhage. Multiple bladder diverticula and cellule formation most likely representing chronic outlet obstruction. 5. Prostate gland is diffusely enlarged. 6. Bilateral renal atrophy. No hydronephrosis or hydroureter. No obstructing stones identified. Electronically Signed   By: Lucienne Capers M.D.   On: 06/22/2021 21:34    Pending Labs Unresulted Labs (From admission, onward)     Start     Ordered   06/22/21 2237  Prepare RBC (crossmatch)  (Adult Blood Administration - PRBC)  Once,   R       Question Answer Comment  # of Units 1 unit   Transfusion Indications Actively Bleeding / GI Bleed   Number of Units to Keep Ahead NO units ahead   If emergent release call blood bank Not emergent release      06/22/21 2236   06/22/21 2236  Type and screen [place]  Once,   STAT       Comments: [place]    06/22/21 2236   06/22/21 2205  Resp Panel by RT-PCR (Flu A&B, Covid) Nasopharyngeal Swab  (Tier 2 - Symptomatic/asymptomatic)  Once,   STAT        06/22/21 2204   Signed and Held  Comprehensive metabolic panel  Tomorrow morning,   R        Signed and Held   Signed and Held  CBC  Tomorrow morning,   R        Signed and Held            Vitals/Pain Today's Vitals   06/22/21 2106 06/22/21  2200 06/22/21 2230 06/22/21 2300  BP: 125/69 132/74 128/68 111/72  Pulse: 87 88 87 90  Resp: 16 16 18 18   Temp:      TempSrc:      SpO2: 100% 95% 96% 96%  PainSc:        Isolation Precautions No active isolations  Medications Medications  sodium chloride irrigation 0.9 % 3,000 mL (has no administration in time range)  0.9 %  sodium chloride infusion (has no administration in time range)  sodium chloride 0.9 % bolus 500 mL (0 mLs Intravenous Stopped 06/22/21 2223)    Mobility walks

## 2021-06-22 NOTE — ED Notes (Signed)
Called pt's son to update on plan for admission

## 2021-06-22 NOTE — ED Provider Notes (Signed)
[facility] DEPT Provider Note   CSN: [id number] Arrival date & time: 06/22/21  0505     History Chief Complaint  Patient presents with   Urinary Retention    Jordan [name] is a 87 y.o. male.  Patient is a 87 year old male with history of coronary artery disease, BPH, hypertension presenting with complaints of blocked Foley catheter.  His catheter was placed at the [facility] urology yesterday.  There was initially some blood-tinged urine, now there is no urine in the bag.  Patient feels distended and uncomfortable.  The history is provided by the patient.      Past Medical History:  Diagnosis Date   AAA (abdominal aortic aneurysm)    Anemia    Aortic stenosis    Arthritis 09/18/11   "in my knees"   Basal cell carcinoma of skin    Blood transfusion    BPH (benign prostatic hyperplasia)    CAD (coronary artery disease)    Remote stent to LAD in 1999. Last cath in 2004 and he is managed medically   Colon polyps    Complication of anesthesia    Dysrhythmia    paced   GERD (gastroesophageal reflux disease)    Hemorrhoid    History of radiation therapy 05/23/20-06/19/20   Radiation to scalp and RUE , Dr. 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   26 mm Edwards Sapien 3 transcatheter heart valve placed via open left transfemoral approach    Patient Active Problem List   Diagnosis Date Noted   Complete heart block ([place]) 09/21/2020   Hypertension 09/21/2020   Squamous cell carcinoma of skin of scalp 05/09/2020   Squamous cell cancer of multiple sites of skin of upper arm, right 05/09/2020   Cystoid macular edema of right eye 02/20/2020   Early stage nonexudative age-related macular degeneration of both eyes 02/20/2020   Macular pucker, right eye 02/20/2020   Syncope  09/24/2019   AAA (abdominal aortic aneurysm) without rupture ([place]) 08/14/2015   Severe aortic valve stenosis 05/15/2015   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   Aortic stenosis, severe 05/03/2015   Chest pain 03/09/2015   Abdominal aneurysm without mention of rupture 02/24/2013   Pulmonary nodule, right 02/10/2013   Mobitz type II atrioventricular block 03/09/2012   Effusion of olecranon bursa, left 01/28/2012   Aortic stenosis 05/06/2011   Cardiac pacemaker in situ 10/29/2010   ABDOMINAL PAIN-LUQ 06/21/2008   PERSONAL HX COLONIC POLYPS 06/21/2008   HYPERLIPIDEMIA 06/19/2008   ANEMIA 06/19/2008   Coronary atherosclerosis 06/19/2008    Past Surgical History:  Procedure Laterality Date   CARDIAC CATHETERIZATION  2004   Managed medically   CARDIAC CATHETERIZATION     CARDIAC CATHETERIZATION N/A 04/11/2015   Procedure: Right/Left Heart Cath and Coronary Angiography;  Surgeon: Burn[facility] Blanks, MD;  Location: [facility] CV [facility];  Service: Cardiovascular;  Laterality: N/A;   CORONARY ANGIOGRAM  09/19/2011   Procedure: CORONARY ANGIOGRAM;  Surgeon: Josue Hector, MD;  Location: [facility] CATH LAB;  Service: Cardiovascular;;   CORONARY ANGIOPLASTY WITH STENT PLACEMENT  05/09/1998   "1"; LAD   INSERT / REPLACE / REMOVE PACEMAKER  2004   initial placement; [employer]   INSERT / REPLACE / REMOVE PACEMAKER  02/14/2011  NASAL HEMORRHAGE CONTROL  1980's   Clips placed to stop bleeding   PPM GENERATOR CHANGEOUT N/A 02/01/2021   Procedure: PPM GENERATOR CHANGEOUT;  Surgeon: Evans Lance, MD;  Location: [facility];  Service: Cardiovascular;  Laterality: N/A;   SKIN CANCER EXCISION  09/18/11   "have had a couple hundred of them removed since I was in my 38's"   TEE WITHOUT CARDIOVERSION N/A 05/15/2015   Procedure: TRANSESOPHAGEAL ECHOCARDIOGRAM (TEE);  Surgeon: [facility] Blanks, MD;  Location: [facility];  Service: Open Heart Surgery;  Laterality: N/A;   TONSILLECTOMY      "when I was a teenager"   TRANSCATHETER AORTIC VALVE REPLACEMENT, TRANSFEMORAL N/A 05/15/2015   Procedure: TRANSCATHETER AORTIC VALVE REPLACEMENT, TRANSFEMORAL;  Surgeon: Burn[facility] Blanks, MD;  Location: [facility];  Service: Open Heart Surgery;  Laterality: N/A;       Family History  Problem Relation Age of Onset   Cancer Father        stomach   Colon cancer Father    Heart disease Brother    Heart disease Brother    Diabetes Other        granddaughter    Social History   Tobacco Use   Smoking status: Former    Packs/day: 1.00    Years: 42.00    Pack years: 42.00    Types: Cigarettes    Quit date: 08/18/1974    Years since quitting: 46.8   Smokeless tobacco: Former    Types: Nurse, children's Use: Never used  Substance Use Topics   Alcohol use: No    Alcohol/week: 0.0 standard drinks   Drug use: No    Home Medications Prior to Admission medications   Medication Sig Start Date End Date Taking? Authorizing Provider  acetaminophen (TYLENOL) 325 MG tablet Take 650 mg by mouth every 6 (six) hours as needed for mild pain, moderate pain or fever.     [provider]  amLODipine (NORVASC) 5 MG tablet TAKE 1 TABLET BY MOUTH EVERY DAY. NEED OFFICE VISIT 03/26/21   [place]ique, Peter M, MD  atorvastatin (LIPITOR) 10 MG tablet TAKE 1 TABLET BY MOUTH EVERY DAY 01/24/21   [place]ique, Peter M, MD  clopidogrel (PLAVIX) 75 MG tablet Take 1 tablet (75 mg total) by mouth daily. 05/09/21   Martinique, Peter M, MD  finasteride (PROSCAR) 5 MG tablet Take 5 mg by mouth daily.    [provider]  NEXIUM 24HR 20 MG capsule TAKE 1 CAPSULE BY MOUTH DAILY AT 12 NOON**PLEASE SCHEDULE AN APPT FOR FUTURE REFILLS 10/05/20   Martinique, Peter M, MD  torsemide (DEMADEX) 20 MG tablet Take 1 tablet (20 mg total) by mouth daily. 03/05/21   Martinique, Peter M, MD    Allergies    Aspirin and Nitroglycerin  Review of Systems   Review of Systems  All other systems reviewed and are  negative.  Physical Exam Updated Vital Signs BP 118/82   Pulse 80   Temp 98.5 F (36.9 C) (Oral)   Resp 18   Ht 5\' 2"  (1.575 m)   Wt 68 kg   SpO2 97%   BMI 27.44 kg/m   Physical Exam Vitals and nursing note reviewed.  Constitutional:      Appearance: Normal appearance.  HENT:     Head: Normocephalic and atraumatic.  Pulmonary:     Effort: Pulmonary effort is normal.  Abdominal:     General: Abdomen is flat.  Palpations: Abdomen is soft.     Comments: There is suprapubic fullness and tenderness.  Skin:    General: Skin is warm and dry.  Neurological:     Mental Status: He is alert and oriented to person, place, and time.    ED Results / Procedures / Treatments   Labs (all labs ordered are listed, but only abnormal results are displayed) Labs Reviewed - No data to display  EKG None  Radiology No results found.  Procedures Procedures   Medications Ordered in ED Medications - No data to display  ED Course  I have reviewed the triage vital signs and the nursing notes.  Pertinent labs & imaging results that were available during my care of the patient were reviewed by me and considered in my medical decision making (see chart for details).    MDM Rules/Calculators/A&P  Foley catheter removed and replaced.  Bloody urine obtained and some clots irrigated.  Patient's symptoms resolved.  He will be discharged with follow-up with urology next week.  Final Clinical Impression(s) / ED Diagnoses Final diagnoses:  None    Rx / DC Orders ED Discharge Orders     None        Veryl Speak, MD 06/22/21 [phone]

## 2021-06-22 NOTE — ED Triage Notes (Signed)
Per family, pt was discharged from  ED earlier today with foley catheter and has had dark red urine in bag since. Pt hypotensive in triage. Also c/o weakness.

## 2021-06-22 NOTE — ED Triage Notes (Signed)
Patient here from home reporting urinary retention. States that he last emptied bag at 7pm last night. Pain 10/10. Reports that he had Foley placed on yesterday.

## 2021-06-22 NOTE — ED Notes (Signed)
Bladder scan volume: 68 mL.

## 2021-06-22 NOTE — Discharge Instructions (Addendum)
Follow-up with urology next week.

## 2021-06-22 NOTE — ED Notes (Signed)
Bladder irrigated manually with 1L of saline, output equal to input.  Urine increasingly more clear with red tint.  Pt tolerated well.

## 2021-06-22 NOTE — ED Notes (Signed)
Verbal order from provider to attempt hand irrigation with 1L of fluids.

## 2021-06-22 NOTE — H&P (Signed)
History and Physical   Jordan [name] JSH:[id number] DOB: 05-03-1918 DOA: 06/22/2021  Referring MD/NP/PA: Dr. Laverta Baltimore  PCP: Leanna Battles, MD   Outpatient Specialists: Dr. Jeffie Pollock, urology  Patient coming from: Home  Chief Complaint: Gross hematuria  HPI: Jordan [name] is a 87 y.o. male with medical history significant of obstructive uropathy, GERD, coronary artery disease, abdominal aortic aneurysm, anemia of chronic disease, recurrent hematuria, history of radiation therapy, essential hypertension, hyperlipidemia, history of renal mass on the left, history of heart block who has indwelling Foley catheter and was seen in the ER only yesterday with urinary retention.  Patient returned today with more gross hematuria with clots.  Hemoglobin has dropped significantly down to 6.2.  Previous hemoglobin from June was 10.9.  Patient is having hematuria with some clots.  Urology consulted.  Irrigation of the bladder initiated.  Patient is being transfused and admitted to the medical service for cotreatment with urology.  ED Course: Temperature 98.6, blood pressure 82/59, pulse 97 respirate of 18 oxygen sats 95% on room air.  Sodium 136 potassium 4.0 chloride 104 CO2 28 glucose 116 BUN 34 creatinine 1.66 and calcium 8.4.  White count is 8.3 hemoglobin 6.2 and platelet count of 218.  Patient being admitted with gross hematuria  Review of Systems: As per HPI otherwise 10 point review of systems negative.    Past Medical History:  Diagnosis Date   AAA (abdominal aortic aneurysm)    Anemia    Aortic stenosis    Arthritis 09/18/11   "in my knees"   Basal cell carcinoma of skin    Blood transfusion    BPH (benign prostatic hyperplasia)    CAD (coronary artery disease)    Remote stent to LAD in 1999. Last cath in 2004 and he is managed medically   Colon polyps    Complication of anesthesia    Dysrhythmia    paced   GERD (gastroesophageal reflux disease)    Hemorrhoid    History of  radiation therapy 05/23/20-06/19/20   Radiation to scalp and RUE , Dr. 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   26 mm Edwards Sapien 3 transcatheter heart valve placed via open left transfemoral approach    Past Surgical History:  Procedure Laterality Date   CARDIAC CATHETERIZATION  2004   Managed medically   CARDIAC CATHETERIZATION     CARDIAC CATHETERIZATION N/A 04/11/2015   Procedure: Right/Left Heart Cath and Coronary Angiography;  Surgeon: Burnell Blanks, MD;  Location: [facility];  Service: Cardiovascular;  Laterality: N/A;   CORONARY ANGIOGRAM  09/19/2011   Procedure: CORONARY ANGIOGRAM;  Surgeon: Josue Hector, MD;  Location: [facility];  Service: Cardiovascular;;   CORONARY ANGIOPLASTY WITH STENT PLACEMENT  05/09/1998   "1"; LAD   INSERT / REPLACE / REMOVE PACEMAKER  2004   initial placement; [employer]   INSERT / REPLACE / REMOVE PACEMAKER  02/14/2011   NASAL HEMORRHAGE CONTROL  1980's   Clips placed to stop bleeding   PPM GENERATOR CHANGEOUT N/A 02/01/2021   Procedure: [place];  Surgeon: Evans Lance, MD;  Location: [place] [facility];  Service: Cardiovascular;  Laterality: N/A;   SKIN CANCER EXCISION  09/18/11   "have had a couple hundred of  them removed since I was in my 79's"   TEE WITHOUT CARDIOVERSION N/A 05/15/2015   Procedure: TRANSESOPHAGEAL ECHOCARDIOGRAM (TEE);  Surgeon: Burnell Blanks, MD;  Location: [facility];  Service: Open Heart Surgery;  Laterality: N/A;   TONSILLECTOMY     "when I was a teenager"   TRANSCATHETER AORTIC VALVE REPLACEMENT, TRANSFEMORAL N/A 05/15/2015   Procedure: TRANSCATHETER AORTIC VALVE REPLACEMENT, TRANSFEMORAL;  Surgeon: Burnell Blanks, MD;  Location: [place];  Service: Open Heart Surgery;  Laterality: N/A;     reports that he  quit smoking about 46 years ago. His smoking use included cigarettes. He has a 42.00 pack-year smoking history. He has quit using smokeless tobacco.  His smokeless tobacco use included chew. He reports that he does not drink alcohol and does not use drugs.  Allergies  Allergen Reactions   Aspirin Other (See Comments)    Results in stomach bleeds   Tape Other (See Comments)    SKIN IS THIN AND WILL TEAR EASILY!!   Nitroglycerin Other (See Comments)    Makes the patient faint    Family History  Problem Relation Age of Onset   Cancer Father        stomach   Colon cancer Father    Heart disease Brother    Heart disease Brother    Diabetes Other        granddaughter     Prior to Admission medications   Medication Sig Start Date End Date Taking? Authorizing Provider  acetaminophen (TYLENOL) 325 MG tablet Take 650 mg by mouth every 6 (six) hours as needed for mild pain, moderate pain or fever.    Yes [provider]  amLODipine (NORVASC) 5 MG tablet TAKE 1 TABLET BY MOUTH EVERY DAY. NEED OFFICE VISIT Patient taking differently: Take 5 mg by mouth in the morning. 03/26/21  Yes [place], Peter M, MD  atorvastatin (LIPITOR) 10 MG tablet TAKE 1 TABLET BY MOUTH EVERY DAY Patient taking differently: Take 10 mg by mouth every evening. 01/24/21  Yes [place], Peter M, MD  finasteride (PROSCAR) 5 MG tablet Take 5 mg by mouth every evening.   Yes [provider]  ketoconazole (NIZORAL) 2 % cream Apply 1 application topically See admin instructions. Apply to affected area 2 times a day 06/20/21  Yes [provider]  NEXIUM 24HR 20 MG capsule TAKE 1 CAPSULE BY MOUTH DAILY AT 12 NOON**PLEASE SCHEDULE AN APPT FOR FUTURE REFILLS Patient taking differently: Take 20 mg by mouth daily before lunch. 10/05/20  Yes [place], Peter M, MD  torsemide (DEMADEX) 20 MG tablet Take 1 tablet (20 mg total) by mouth daily. 03/05/21  Yes [place], Peter M, MD  clopidogrel (PLAVIX) 75 MG tablet Take 1  tablet (75 mg total) by mouth daily. Patient not taking: No sig reported 05/09/21   [place], Peter M, MD    Physical Exam: Vitals:   06/22/21 2106 06/22/21 2200 06/22/21 2230 06/22/21 2300  BP: 125/69 132/74 128/68 111/72  Pulse: 87 88 87 90  Resp: 16 16 18 18   Temp:      TempSrc:      SpO2: 100% 95% 96% 96%      Constitutional: Acutely ill looking, no distress Vitals:   06/22/21 2106 06/22/21 2200 06/22/21 2230 06/22/21 2300  BP: 125/69 132/74 128/68 111/72  Pulse: 87 88 87 90  Resp: 16 16 18 18   Temp:      TempSrc:      SpO2: 100% 95% 96% 96%  Eyes: PERRL, lids and conjunctivae pale ENMT: Mucous membranes are moist. Posterior pharynx clear of any exudate or lesions.Normal dentition.  Neck: normal, supple, no masses, no thyromegaly Respiratory: clear to auscultation bilaterally, no wheezing, no crackles. Normal respiratory effort. No accessory muscle use.  Cardiovascular: Sinus tachycardia, no murmurs / rubs / gallops. No extremity edema. 2+ pedal pulses. No carotid bruits.  Abdomen: no tenderness, no masses palpated. No hepatosplenomegaly. Bowel sounds positive.  Musculoskeletal: no clubbing / cyanosis. No joint deformity upper and lower extremities. Good ROM, no contractures. Normal muscle tone.  Skin: no rashes, lesions, ulcers. No induration Neurologic: CN 2-12 grossly intact. Sensation intact, DTR normal. Strength 5/5 in all 4.  Psychiatric: Normal judgment and insight. Alert and oriented x 3. Normal mood.     Labs on Admission: I have personally reviewed following labs and imaging studies  CBC: Recent Labs  Lab 06/22/21 2123  WBC 8.3  NEUTROABS 5.7  HGB 6.2*  HCT 20.5*  MCV 84.7  PLT 322   Basic Metabolic Panel: Recent Labs  Lab 06/22/21 2123  NA 136  K 4.0  CL 101  CO2 28  GLUCOSE 116*  BUN 34*  CREATININE 1.66*  CALCIUM 8.4*   GFR: Estimated Creatinine Clearance: 20.1 mL/min (A) (by C-G formula based on SCr of 1.66 mg/dL (H)). Liver Function  Tests: No results for input(s): AST, ALT, ALKPHOS, BILITOT, PROT, ALBUMIN in the last 168 hours. No results for input(s): LIPASE, AMYLASE in the last 168 hours. No results for input(s): AMMONIA in the last 168 hours. Coagulation Profile: No results for input(s): INR, PROTIME in the last 168 hours. Cardiac Enzymes: No results for input(s): CKTOTAL, CKMB, CKMBINDEX, TROPONINI in the last 168 hours. BNP (last 3 results) No results for input(s): PROBNP in the last 8760 hours. HbA1C: No results for input(s): HGBA1C in the last 72 hours. CBG: No results for input(s): GLUCAP in the last 168 hours. Lipid Profile: No results for input(s): CHOL, HDL, LDLCALC, TRIG, CHOLHDL, LDLDIRECT in the last 72 hours. Thyroid Function Tests: No results for input(s): TSH, T4TOTAL, FREET4, T3FREE, THYROIDAB in the last 72 hours. Anemia Panel: No results for input(s): VITAMINB12, FOLATE, FERRITIN, TIBC, IRON, RETICCTPCT in the last 72 hours. Urine analysis:    Component Value Date/Time   COLORURINE STRAW (A) 02/20/2021 2015   APPEARANCEUR CLEAR 02/20/2021 2015   LABSPEC 1.009 02/20/2021 2015   PHURINE 7.0 02/20/2021 2015   GLUCOSEU NEGATIVE 02/20/2021 2015   HGBUR SMALL (A) 02/20/2021 2015   BILIRUBINUR NEGATIVE 02/20/2021 2015   KETONESUR NEGATIVE 02/20/2021 2015   PROTEINUR NEGATIVE 02/20/2021 2015   UROBILINOGEN 0.2 05/11/2015 0840   NITRITE NEGATIVE 02/20/2021 2015   LEUKOCYTESUR NEGATIVE 02/20/2021 2015   Sepsis Labs: @LABRCNTIP (procalcitonin:4,lacticidven:4) )No results found for this or any previous visit (from the past 240 hour(s)).   Radiological Exams on Admission: CT Renal Stone Study  Result Date: 06/22/2021 CLINICAL DATA:  Flank pain. Kidney stones suspected. Patient was discharged earlier today with a Foley catheter with blood in urine bag. EXAM: CT ABDOMEN AND PELVIS WITHOUT CONTRAST TECHNIQUE: Multidetector CT imaging of the abdomen and pelvis was performed following the standard  protocol without IV contrast. COMPARISON:  05/16/2015 FINDINGS: Lower chest: Multiple pulmonary nodules are demonstrated. There is a collection of several nodules in the left lingula, largest measuring 1.1 x 2.1 cm diameter. Another nodule is demonstrated in the right costophrenic angle measuring 1 cm diameter. There is also a calcified nodule in the right lung base. Atelectasis in the  lower lungs. Cardiac enlargement. Postoperative cardiac valve repairs. Hepatobiliary: No focal liver lesions identified. Cholelithiasis with small layering stones in the gallbladder. No inflammatory changes. No bile duct dilatation superior Pancreas: Unremarkable. No pancreatic ductal dilatation or surrounding inflammatory changes. Spleen: Spleen is atrophic.  No focal lesions. Adrenals/Urinary Tract: No adrenal gland nodules. Bilateral renal parenchymal atrophy. Multiple cysts in the kidneys are similar to prior study. No hydronephrosis or hydroureter. There is a Foley catheter with balloon in the bladder. Gas is present in the bladder consistent with catheterization. Increased density of the bile is consistent with history of hemorrhage. There is diffuse bladder wall thickening with multiple bladder diverticula and cellule formation. This is likely to represent changes due to chronic outlet obstruction. No bladder stones are identified. Stomach/Bowel: Stomach, small bowel, and colon are not abnormally distended. No wall thickening or inflammatory changes are appreciated. Appendix is not identified. Vascular/Lymphatic: Diffuse aortic calcification. Infrarenal abdominal aortic aneurysm measuring 5.4 cm AP diameter. This is enlarging since the prior study. No retroperitoneal hematoma or lymphadenopathy. Reproductive: Prostate gland is diffusely enlarged. Other: No free air or free fluid in the abdomen. Abdominal wall musculature appears intact. Musculoskeletal: Degenerative changes in the spine and shoulders. No destructive bone  lesions. IMPRESSION: 1. Multiple bilateral pulmonary nodules are demonstrated, largest on the left measuring 1.1 x 2.1 cm diameter. Multiple pulmonary nodules. Most severe: 16 mm left solid pulmonary nodule detected on incomplete chest CT. Recommend prompt non-contrast Chest CT for further evaluation. These guidelines do not apply to immunocompromised patients and patients with cancer. Follow up in patients with significant comorbidities as clinically warranted. For lung cancer screening, adhere to Lung-RADS guidelines. Reference: Radiology. 2017; 284(1):228-43. 2. Infrarenal abdominal aortic aneurysm measuring 5.4 cm, increasing since prior study. 5.4 cm infrarenal abdominal aortic aneurysm. Recommend follow-up every 6 months and vascular consultation. Reference: J Am Coll Radiol 5830;94:076-808. 3. Cholelithiasis without evidence of acute cholecystitis. 4. Foley catheter in the bladder. Increased density of the urine is likely due to hemorrhage. Multiple bladder diverticula and cellule formation most likely representing chronic outlet obstruction. 5. Prostate gland is diffusely enlarged. 6. Bilateral renal atrophy. No hydronephrosis or hydroureter. No obstructing stones identified. Electronically Signed   By: Lucienne Capers M.D.   On: 06/22/2021 21:34      Assessment/Plan Principal Problem:   Gross hematuria Active Problems:   Hyperlipemia, mixed   ANEMIA   Coronary atherosclerosis   S/P TAVR (transcatheter aortic valve replacement)   AAA (abdominal aortic aneurysm) without rupture   Hypertension   CKD (chronic kidney disease), stage III ([place])     #1 gross hematuria: Patient has indwelling catheter.  Has recurrent hematuria.  On Plavix which we will hold.  Urology to see patient for any further evaluation in the meantime we will transfuse initial 1 unit of packed red blood cells.  Continue with the Foley and bladder irrigation.  #2 coronary artery disease: No evidence of coronary  decompensation.  Hemoglobin needs to be close to 10 to avoid demand ischemia.  #3 essential hypertension: Continue amlodipine.  Hold diuretics.  #4 hyperlipidemia: Continue with statin  #5 anemia of acute blood loss: Transfuse as per above  #6 chronic kidney disease stage III: BUN and creatinine slightly worsened.  Continue to monitor    DVT prophylaxis: SCD Code Status: Full code Family Communication: No family at bedside Disposition Plan: Home Consults called: Dr. Jeffie Pollock Admission status: Observation  Severity of Illness: The appropriate patient status for this patient is OBSERVATION. Observation status is  judged to be reasonable and necessary in order to provide the required intensity of service to ensure the patient's safety. The patient's presenting symptoms, physical exam findings, and initial radiographic and laboratory data in the context of their medical condition is felt to place them at decreased risk for further clinical deterioration. Furthermore, it is anticipated that the patient will be medically stable for discharge from the hospital within 2 midnights of admission. The following factors support the patient status of observation.   " The patient's presenting symptoms include gross hematuria. " The physical exam findings include weak and pale. " The initial radiographic and laboratory data are hemoglobin 6.2.   Barbette Merino MD [facility] Pager [phone]  If 7PM-7AM, please contact night-coverage [email or web address] Password [email or web address]  06/22/2021, 11:19 PM

## 2021-06-23 ENCOUNTER — Encounter (HOSPITAL_COMMUNITY): Payer: Self-pay | Admitting: Internal Medicine

## 2021-06-23 LAB — COMPREHENSIVE METABOLIC PANEL
ALT: 10 U/L (ref 0–44)
AST: 17 U/L (ref 15–41)
Albumin: 2.5 g/dL — ABNORMAL LOW (ref 3.5–5.0)
Alkaline Phosphatase: 33 U/L — ABNORMAL LOW (ref 38–126)
Anion gap: 6 (ref 5–15)
BUN: 32 mg/dL — ABNORMAL HIGH (ref 8–23)
CO2: 29 mmol/L (ref 22–32)
Calcium: 8.4 mg/dL — ABNORMAL LOW (ref 8.9–10.3)
Chloride: 106 mmol/L (ref 98–111)
Creatinine, Ser: 1.65 mg/dL — ABNORMAL HIGH (ref 0.61–1.24)
GFR, Estimated: 37 mL/min — ABNORMAL LOW (ref >60)
Glucose, Bld: 112 mg/dL — ABNORMAL HIGH (ref 70–99)
Potassium: 3.7 mmol/L (ref 3.5–5.1)
Sodium: 141 mmol/L (ref 135–145)
Total Bilirubin: 0.4 mg/dL (ref 0.3–1.2)
Total Protein: 5.6 g/dL — ABNORMAL LOW (ref 6.5–8.1)

## 2021-06-23 LAB — CBC
HCT: 19.2 % — ABNORMAL LOW (ref 39.0–52.0)
Hemoglobin: 6 g/dL — CL (ref 13.0–17.0)
MCH: 26.2 pg (ref 26.0–34.0)
MCHC: 31.3 g/dL (ref 30.0–36.0)
MCV: 83.8 fL (ref 80.0–100.0)
Platelets: 177 10*3/uL (ref 150–400)
RBC: 2.29 MIL/uL — ABNORMAL LOW (ref 4.22–5.81)
RDW: 14.7 % (ref 11.5–15.5)
WBC: 5.7 10*3/uL (ref 4.0–10.5)
nRBC: 0 % (ref 0.0–0.2)

## 2021-06-23 LAB — HEMOGLOBIN AND HEMATOCRIT, BLOOD
HCT: 25 % — ABNORMAL LOW (ref 39.0–52.0)
Hemoglobin: 8 g/dL — ABNORMAL LOW (ref 13.0–17.0)

## 2021-06-23 LAB — PREPARE RBC (CROSSMATCH)

## 2021-06-23 MED ORDER — SODIUM CHLORIDE 0.9% IV SOLUTION: Freq: Once | INTRAVENOUS | Status: AC

## 2021-06-23 MED ORDER — CHLORHEXIDINE GLUCONATE CLOTH 2 % EX PADS
6.0000 | MEDICATED_PAD | Freq: Every day | CUTANEOUS | Status: DC
  Administered 2021-06-24 – 2021-06-29 (×6): 6 via TOPICAL

## 2021-06-23 MED ORDER — KETOCONAZOLE 2 % EX CREA
TOPICAL_CREAM | Freq: Two times a day (BID) | CUTANEOUS | Status: DC
  Administered 2021-06-23: 1 via TOPICAL
  Filled 2021-06-23: qty 15

## 2021-06-23 NOTE — Consult Note (Addendum)
Subjective: 1. Symptomatic anemia   2. Gross hematuria      Consult requested by Dr. Cristal Deer.  Jordan [name] is a 87 yo male with a history of BPH with retention who was seen in the office on Friday with some hematuria and a few clots.  He remains on tamsulosin and finasteride.  He was irrigated clear on Friday and told to hold the plavix.  He had recurrent bleeding last night and came to the ER where a CT showed a bladder full of clot.  He had the foley replaced with a 16 fr was irrigated sufficently to restore drainage and then admitted for further evaluation and management of his anemia with a Hgb of 6.0 which is down from 10.9 in June.  ROS:  Review of Systems  All other systems reviewed and are negative.  Allergies  Allergen Reactions   Aspirin Other (See Comments)    Results in stomach bleeds   Tape Other (See Comments)    SKIN IS THIN AND WILL TEAR EASILY!!   Nitroglycerin Other (See Comments)    Makes the patient faint    Past Medical History:  Diagnosis Date   AAA (abdominal aortic aneurysm)    Anemia    Aortic stenosis    Arthritis 09/18/11   "in my knees"   Basal cell carcinoma of skin    Blood transfusion    BPH (benign prostatic hyperplasia)    CAD (coronary artery disease)    Remote stent to LAD in 1999. Last cath in 2004 and he is managed medically   Colon polyps    Complication of anesthesia    Dysrhythmia    paced   GERD (gastroesophageal reflux disease)    Hemorrhoid    History of radiation therapy 05/23/20-06/19/20   Radiation to scalp and RUE , Dr. 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   26 mm Edwards Sapien 3 transcatheter heart valve placed via open left transfemoral approach    Past Surgical History:  Procedure Laterality Date   CARDIAC CATHETERIZATION  2004    Managed medically   CARDIAC CATHETERIZATION     CARDIAC CATHETERIZATION N/A 04/11/2015   Procedure: Right/Left Heart Cath and Coronary Angiography;  Surgeon: Burnell Blanks, MD;  Location: [place] [facility];  Service: Cardiovascular;  Laterality: N/A;   CORONARY ANGIOGRAM  09/19/2011   Procedure: CORONARY ANGIOGRAM;  Surgeon: Josue Hector, MD;  Location: [facility];  Service: Cardiovascular;;   CORONARY ANGIOPLASTY WITH STENT PLACEMENT  05/09/1998   "1"; LAD   INSERT / REPLACE / REMOVE PACEMAKER  2004   initial placement; [employer]   INSERT / REPLACE / REMOVE PACEMAKER  02/14/2011   NASAL HEMORRHAGE CONTROL  1980's   Clips placed to stop bleeding   PPM GENERATOR CHANGEOUT N/A 02/01/2021   Procedure: [place];  Surgeon: Evans Lance, MD;  Location: [place] [facility];  Service: Cardiovascular;  Laterality: N/A;   SKIN CANCER EXCISION  09/18/11   "have had a couple hundred of them removed since I was in my 80's"   TEE WITHOUT CARDIOVERSION N/A 05/15/2015   Procedure: TRANSESOPHAGEAL ECHOCARDIOGRAM (TEE);  Surgeon: Burnell Blanks, MD;  Location: [place];  Service: Open Heart Surgery;  Laterality: N/A;  TONSILLECTOMY     "when I was a teenager"   TRANSCATHETER AORTIC VALVE REPLACEMENT, TRANSFEMORAL N/A 05/15/2015   Procedure: TRANSCATHETER AORTIC VALVE REPLACEMENT, TRANSFEMORAL;  Surgeon: Burnell Blanks, MD;  Location: [place];  Service: Open Heart Surgery;  Laterality: N/A;    Social History   Socioeconomic History   Marital status: Widowed    Spouse name: Not on file   Number of children: 2   Years of education: Not on file   Highest education level: Not on file  Occupational History   Occupation: Retired    Comment: welder  Tobacco Use   Smoking status: Former    Packs/day: 1.00    Years: 42.00    Pack years: 42.00    Types: Cigarettes    Quit date: 08/18/1974    Years since quitting: 46.8   Smokeless tobacco: Former    Types: Horticulturist, commercial Use: Never used  Substance and Sexual Activity   Alcohol use: No    Alcohol/week: 0.0 standard drinks   Drug use: No   Sexual activity: Not Currently  Other Topics Concern   Not on file  Social History Narrative   Not on file   Social Determinants of Health   Financial Resource Strain: Not on file  Food Insecurity: Not on file  Transportation Needs: Not on file  Physical Activity: Not on file  Stress: Not on file  Social Connections: Not on file  Intimate Partner Violence: Not on file    Family History  Problem Relation Age of Onset   Cancer Father        stomach   Colon cancer Father    Heart disease Brother    Heart disease Brother    Diabetes Other        granddaughter    Anti-infectives: Anti-infectives (From admission, onward)    None       Current Facility-Administered Medications  Medication Dose Route Frequency Provider Last Rate Last Admin   0.9 %  sodium chloride infusion (Manually program via Guardrails IV Fluids)   Intravenous Once Cristal Deer, MD       acetaminophen (TYLENOL) tablet 650 mg  650 mg Oral Q6H PRN Elwyn Reach, MD       amLODipine (NORVASC) tablet 5 mg  5 mg Oral q AM Elwyn Reach, MD   5 mg at 06/23/21 1059   atorvastatin (LIPITOR) tablet 10 mg  10 mg Oral QPM Elwyn Reach, MD       Chlorhexidine Gluconate Cloth 2 % PADS 6 each  6 each Topical Daily Cristal Deer, MD       finasteride (PROSCAR) tablet 5 mg  5 mg Oral QPM Garba, Mohammad L, MD       ketoconazole (NIZORAL) 2 % cream   Topical BID Cristal Deer, MD       ondansetron [facility]) tablet 4 mg  4 mg Oral Q6H PRN Elwyn Reach, MD       Or   ondansetron (ZOFRAN) injection 4 mg  4 mg Intravenous Q6H PRN Elwyn Reach, MD       pantoprazole (PROTONIX) EC tablet 40 mg  40 mg Oral Daily Gala Romney L, MD   40 mg at 06/23/21 1059   sodium chloride irrigation 0.9 % 3,000 mL  3,000 mL Irrigation Continuous Domenic Moras, PA-C          Objective: Vital signs in last 24 hours: BP (!) 108/59 (BP Location:  Left Arm)   Pulse 73   Temp 98 F (36.7 C) (Oral)   Resp 19   Ht 5\' 2"  (1.575 m)   Wt 68.9 kg Comment: per patient  SpO2 95%   BMI 27.80 kg/m   Intake/Output from previous day: 10/08 0701 - 10/09 0700 In: 1322.3 [P.O.:237; Blood:565.3; IV Piggyback:500] Out: 1000 [Urine:1000] Intake/Output this shift: No intake/output data recorded.   Physical Exam Vitals reviewed.  Constitutional:      Appearance: Normal appearance.  Abdominal:     Palpations: There is no mass.     Tenderness: There is no abdominal tenderness.  Genitourinary:    Comments: Normal uncirc phallus with a foley at the meatus draining very dark urine.  Neurological:     Mental Status: He is alert.    Lab Results:  Results for orders placed or performed during the hospital encounter of 06/22/21 (from the past 24 hour(s))  Basic metabolic panel     Status: Abnormal   Collection Time: 06/22/21  9:23 PM  Result Value Ref Range   Sodium 136 135 - 145 mmol/L   Potassium 4.0 3.5 - 5.1 mmol/L   Chloride 101 98 - 111 mmol/L   CO2 28 22 - 32 mmol/L   Glucose, Bld 116 (H) 70 - 99 mg/dL   BUN 34 (H) 8 - 23 mg/dL   Creatinine, Ser 1.66 (H) 0.61 - 1.24 mg/dL   Calcium 8.4 (L) 8.9 - 10.3 mg/dL   GFR, Estimated 37 (L) >60 mL/min   Anion gap 7 5 - 15  CBC with Differential     Status: Abnormal   Collection Time: 06/22/21  9:23 PM  Result Value Ref Range   WBC 8.3 4.0 - 10.5 K/uL   RBC 2.42 (L) 4.22 - 5.81 MIL/uL   Hemoglobin 6.2 (LL) 13.0 - 17.0 g/dL   HCT 20.5 (L) 39.0 - 52.0 %   MCV 84.7 80.0 - 100.0 fL   MCH 25.6 (L) 26.0 - 34.0 pg   MCHC 30.2 30.0 - 36.0 g/dL   RDW 15.1 11.5 - 15.5 %   Platelets 218 150 - 400 K/uL   nRBC 0.0 0.0 - 0.2 %   Neutrophils Relative % 67 %   Neutro Abs 5.7 1.7 - 7.7 K/uL   Lymphocytes Relative 20 %   Lymphs Abs 1.6 0.7 - 4.0 K/uL   Monocytes Relative 11 %   Monocytes Absolute 0.9 0.1 - 1.0 K/uL    Eosinophils Relative 1 %   Eosinophils Absolute 0.0 0.0 - 0.5 K/uL   Basophils Relative 0 %   Basophils Absolute 0.0 0.0 - 0.1 K/uL   Immature Granulocytes 1 %   Abs Immature Granulocytes 0.04 0.00 - 0.07 K/uL  ABO/Rh     Status: None   Collection Time: 06/22/21  9:23 PM  Result Value Ref Range   ABO/RH(D)      Jenetta Downer NEG Performed at [facility], [place] [address] [place], [place] [address]   Resp Panel by RT-PCR (Flu A&B, Covid) Nasopharyngeal Swab     Status: None   Collection Time: 06/22/21 10:58 PM   Specimen: Nasopharyngeal Swab; Nasopharyngeal(NP) swabs in vial transport medium  Result Value Ref Range   SARS Coronavirus 2 by RT PCR NEGATIVE NEGATIVE   Influenza A by PCR NEGATIVE NEGATIVE   Influenza B by PCR NEGATIVE NEGATIVE  Protime-INR     Status: None   Collection Time: 06/22/21 10:58 PM  Result Value Ref Range   Prothrombin  Time 14.4 11.4 - 15.2 seconds   INR 1.1 0.8 - 1.2  Type and screen [place]     Status: None (Preliminary result)   Collection Time: 06/22/21 10:58 PM  Result Value Ref Range   ABO/RH(D) O NEG    Antibody Screen NEG    Sample Expiration      06/25/2021,2359 Performed at [facility], [place] [address] [place], [place] [address]    Unit Number [id number]    Blood Component Type RBC LR PHER2    Unit division 00    Status of Unit ISSUED    Transfusion Status OK TO TRANSFUSE    Crossmatch Result Compatible    Unit Number [id number]    Blood Component Type RBC LR PHER1    Unit division 00    Status of Unit ALLOCATED    Transfusion Status OK TO TRANSFUSE    Crossmatch Result Compatible    Unit Number [id number]    Blood Component Type RED CELLS,LR    Unit division 00    Status of Unit ALLOCATED    Transfusion Status OK TO TRANSFUSE    Crossmatch Result Compatible   Prepare RBC (crossmatch)     Status: None   Collection Time: 06/22/21 10:58 PM  Result Value Ref Range   Order  Confirmation      ORDER PROCESSED BY BLOOD BANK Performed at [facility], [place] [address] [place], [place] [address]   Comprehensive metabolic panel     Status: Abnormal   Collection Time: 06/23/21  5:45 AM  Result Value Ref Range   Sodium 141 135 - 145 mmol/L   Potassium 3.7 3.5 - 5.1 mmol/L   Chloride 106 98 - 111 mmol/L   CO2 29 22 - 32 mmol/L   Glucose, Bld 112 (H) 70 - 99 mg/dL   BUN 32 (H) 8 - 23 mg/dL   Creatinine, Ser 1.65 (H) 0.61 - 1.24 mg/dL   Calcium 8.4 (L) 8.9 - 10.3 mg/dL   Total Protein 5.6 (L) 6.5 - 8.1 g/dL   Albumin 2.5 (L) 3.5 - 5.0 g/dL   AST 17 15 - 41 U/L   ALT 10 0 - 44 U/L   Alkaline Phosphatase 33 (L) 38 - 126 U/L   Total Bilirubin 0.4 0.3 - 1.2 mg/dL   GFR, Estimated 37 (L) >60 mL/min   Anion gap 6 5 - 15  CBC     Status: Abnormal   Collection Time: 06/23/21  5:45 AM  Result Value Ref Range   WBC 5.7 4.0 - 10.5 K/uL   RBC 2.29 (L) 4.22 - 5.81 MIL/uL   Hemoglobin 6.0 (LL) 13.0 - 17.0 g/dL   HCT 19.2 (L) 39.0 - 52.0 %   MCV 83.8 80.0 - 100.0 fL   MCH 26.2 26.0 - 34.0 pg   MCHC 31.3 30.0 - 36.0 g/dL   RDW 14.7 11.5 - 15.5 %   Platelets 177 150 - 400 K/uL   nRBC 0.0 0.0 - 0.2 %  Prepare RBC (crossmatch)     Status: None   Collection Time: 06/23/21 11:22 AM  Result Value Ref Range   Order Confirmation      ORDER PROCESSED BY BLOOD BANK Performed at [facility], [place] [address] [place], [place] [address]     BMET Recent Labs    06/22/21 2123 06/23/21 0545  NA 136 141  K 4.0 3.7  CL 101 106  CO2 28 29  GLUCOSE 116* 112*  BUN 34* 32*  CREATININE 1.66* 1.65*  CALCIUM 8.4* 8.4*   PT/INR Recent Labs    06/22/21 2258  LABPROT 14.4  INR 1.1   ABG No results for input(s): PHART, HCO3 in the last 72 hours.  Invalid input(s): PCO2, PO2  Studies/Results: CT Renal Stone Study  Result Date: 06/22/2021 CLINICAL DATA:  Flank pain. Kidney stones suspected. Patient was discharged earlier today with a  Foley catheter with blood in urine bag. EXAM: CT ABDOMEN AND PELVIS WITHOUT CONTRAST TECHNIQUE: Multidetector CT imaging of the abdomen and pelvis was performed following the standard protocol without IV contrast. COMPARISON:  05/16/2015 FINDINGS: Lower chest: Multiple pulmonary nodules are demonstrated. There is a collection of several nodules in the left lingula, largest measuring 1.1 x 2.1 cm diameter. Another nodule is demonstrated in the right costophrenic angle measuring 1 cm diameter. There is also a calcified nodule in the right lung base. Atelectasis in the lower lungs. Cardiac enlargement. Postoperative cardiac valve repairs. Hepatobiliary: No focal liver lesions identified. Cholelithiasis with small layering stones in the gallbladder. No inflammatory changes. No bile duct dilatation superior Pancreas: Unremarkable. No pancreatic ductal dilatation or surrounding inflammatory changes. Spleen: Spleen is atrophic.  No focal lesions. Adrenals/Urinary Tract: No adrenal gland nodules. Bilateral renal parenchymal atrophy. Multiple cysts in the kidneys are similar to prior study. No hydronephrosis or hydroureter. There is a Foley catheter with balloon in the bladder. Gas is present in the bladder consistent with catheterization. Increased density of the bile is consistent with history of hemorrhage. There is diffuse bladder wall thickening with multiple bladder diverticula and cellule formation. This is likely to represent changes due to chronic outlet obstruction. No bladder stones are identified. Stomach/Bowel: Stomach, small bowel, and colon are not abnormally distended. No wall thickening or inflammatory changes are appreciated. Appendix is not identified. Vascular/Lymphatic: Diffuse aortic calcification. Infrarenal abdominal aortic aneurysm measuring 5.4 cm AP diameter. This is enlarging since the prior study. No retroperitoneal hematoma or lymphadenopathy. Reproductive: Prostate gland is diffusely enlarged.  Other: No free air or free fluid in the abdomen. Abdominal wall musculature appears intact. Musculoskeletal: Degenerative changes in the spine and shoulders. No destructive bone lesions. IMPRESSION: 1. Multiple bilateral pulmonary nodules are demonstrated, largest on the left measuring 1.1 x 2.1 cm diameter. Multiple pulmonary nodules. Most severe: 16 mm left solid pulmonary nodule detected on incomplete chest CT. Recommend prompt non-contrast Chest CT for further evaluation. These guidelines do not apply to immunocompromised patients and patients with cancer. Follow up in patients with significant comorbidities as clinically warranted. For lung cancer screening, adhere to Lung-RADS guidelines. Reference: Radiology. 2017; 284(1):228-43. 2. Infrarenal abdominal aortic aneurysm measuring 5.4 cm, increasing since prior study. 5.4 cm infrarenal abdominal aortic aneurysm. Recommend follow-up every 6 months and vascular consultation. Reference: J Am Coll Radiol 6389;37:342-876. 3. Cholelithiasis without evidence of acute cholecystitis. 4. Foley catheter in the bladder. Increased density of the urine is likely due to hemorrhage. Multiple bladder diverticula and cellule formation most likely representing chronic outlet obstruction. 5. Prostate gland is diffusely enlarged. 6. Bilateral renal atrophy. No hydronephrosis or hydroureter. No obstructing stones identified. Electronically Signed   By: Lucienne Capers M.D.   On: 06/22/2021 21:34    Procedure:  The bladder was irrigated using the current 42fr foley with 1882ml of sterile fluid with return of a large amount of clot.  The urine was clear once all of the clots were removed.    Assessment/Plan: BPH with BOO and clot retention.  I got him very clear and he doesn't appear to have active bleeding now.  Continue holding the plavix and irrigate prn.          No follow-ups on file.    CC: Dr. Cristal Deer.     Irine Seal 06/23/2021 [phone]

## 2021-06-23 NOTE — Progress Notes (Signed)
Flushed urinary catheter with 20 ml, per physician flush chronic cath throughout night. Urology will see patient in the morning.

## 2021-06-23 NOTE — Progress Notes (Addendum)
Sacral area redden, it is blanch able. Foam dressing applied. Patient encouraged to reposition off his back q 2 hr. He states he can't sleep on his side. Explained to him that his bottom is reddened and he needs to reposition side to side so it doesn't become an open wound.Nizoral cream applied to scrotum for yeast infection. Patient repositioned to Right side

## 2021-06-23 NOTE — Progress Notes (Addendum)
PROGRESS NOTE  Jordan [name] NOI:[id number] DOB: 07-Jul-1918 DOA: 06/22/2021 PCP: Leanna Battles, MD  HPI/Recap of past 60 hours: 87 year old male with medical history significant for obstructive uropathy, GERD, coronary artery disease, abdominal aortic aneurysm, anemia of chronic disease, recurrent hematuria, history of radiation therapy, essential hypertension, hyperlipidemia, history of renal mass on the left, history of heart block who has indwelling catheter and was seen in the ER 2 days ago for urinary retention return to the ER yesterday with gross hematuria, his hemoglobin dropped to 6.2 He was transfused with 1 unit of packed RBC overnight  Subjective: June 23, 2021: Patient seen and examined at bedside his family is in the room his son and daughter-in-law patient denies any complaints no pain no nausea vomiting  Assessment/Plan: Principal Problem:   Gross hematuria Active Problems:   Hyperlipemia, mixed   ANEMIA   Coronary atherosclerosis   S/P TAVR (transcatheter aortic valve replacement)   AAA (abdominal aortic aneurysm) without rupture   Hypertension   CKD (chronic kidney disease), stage III ([place])  1.  Gross hematuria Urology consulted he will do bladder irrigation  2.  Severe anemia. Admitting hemoglobin was 6.2 he received 1 unit this morning he went down to 6.0 I have crossmatched and to transfuse additional 2 units  3.  BPH Obstructive uropathy with chronic indwelling Foley catheter  4.  History of skin cancer on the scalp status posttreatment  Code Status: Full  Severity of Illness: The appropriate patient status for this patient is INPATIENT. Inpatient status is judged to be reasonable and necessary in order to provide the required intensity of service to ensure the patient's safety. The patient's presenting symptoms, physical exam findings, and initial radiographic and laboratory data in the context of their chronic comorbidities is felt to place  them at high risk for further clinical deterioration. Furthermore, it is not anticipated that the patient will be medically stable for discharge from the hospital within 2 midnights of admission. The following factors support the patient status of inpatient.   " Gross hematuria and anemia severe   * I certify that at the point of admission it is my clinical judgment that the patient will require inpatient hospital care spanning beyond 2 midnights from the point of admission due to high intensity of service, high risk for further deterioration and high frequency of surveillance required.*   Family Communication: Son and daughter-in-law in the room  Disposition Plan: Home Status is: Inpatient   Dispo: The patient is from: Home              Anticipated d/c is to:               Anticipated d/c date is:               Patient currently not medically stable for discharge  Consultants: Urology  Procedures: Bladder irrigation  Antimicrobials: None   DVT prophylaxis: SCD   Objective: Vitals:   06/23/21 0058 06/23/21 0123 06/23/21 0355 06/23/21 0432  BP: 119/68 (!) 98/49 (!) 110/49   Pulse: 93 84 72   Resp: 15 16 16    Temp: 97.8 F (36.6 C) 98.1 F (36.7 C) 98.2 F (36.8 C)   TempSrc: Oral Oral Oral   SpO2: 98% 99% 97%   Weight:    68.9 kg  Height:    5\' 2"  (1.575 m)    Intake/Output Summary (Last 24 hours) at 06/23/2021 0929 Last data filed at 06/23/2021 0417 Gross per 24 hour  Intake 1322.33 ml  Output 1000 ml  Net 322.33 ml   Filed Weights   06/23/21 0432  Weight: 68.9 kg   Body mass index is 27.8 kg/m.  Exam:  General: 87 y.o. year-old male well developed well nourished in no acute distress.  Alert and oriented x3.  Pleasant in no distress Cardiovascular: Regular rate and rhythm with no rubs or gallops.  No thyromegaly or JVD noted.   Respiratory: Clear to auscultation with no wheezes or rales. Good inspiratory effort. Abdomen: Soft nontender nondistended  with normal bowel sounds x4 quadrants. Musculoskeletal: No lower extremity edema. 2/4 pulses in all 4 extremities. Skin:Thick almost fungating mass on his scalp Psychiatry: Mood is appropriate for condition and setting GU: Indwelling Foley catheter with gross hematuria    Data Reviewed: CBC: Recent Labs  Lab 06/22/21 2123 06/23/21 0545  WBC 8.3 5.7  NEUTROABS 5.7  --   HGB 6.2* 6.0*  HCT 20.5* 19.2*  MCV 84.7 83.8  PLT 218 497   Basic Metabolic Panel: Recent Labs  Lab 06/22/21 2123 06/23/21 0545  NA 136 141  K 4.0 3.7  CL 101 106  CO2 28 29  GLUCOSE 116* 112*  BUN 34* 32*  CREATININE 1.66* 1.65*  CALCIUM 8.4* 8.4*   GFR: Estimated Creatinine Clearance: 20.3 mL/min (A) (by C-G formula based on SCr of 1.65 mg/dL (H)). Liver Function Tests: Recent Labs  Lab 06/23/21 0545  AST 17  ALT 10  ALKPHOS 33*  BILITOT 0.4  PROT 5.6*  ALBUMIN 2.5*   No results for input(s): LIPASE, AMYLASE in the last 168 hours. No results for input(s): AMMONIA in the last 168 hours. Coagulation Profile: Recent Labs  Lab 06/22/21 2258  INR 1.1   Cardiac Enzymes: No results for input(s): CKTOTAL, CKMB, CKMBINDEX, TROPONINI in the last 168 hours. BNP (last 3 results) No results for input(s): PROBNP in the last 8760 hours. HbA1C: No results for input(s): HGBA1C in the last 72 hours. CBG: No results for input(s): GLUCAP in the last 168 hours. Lipid Profile: No results for input(s): CHOL, HDL, LDLCALC, TRIG, CHOLHDL, LDLDIRECT in the last 72 hours. Thyroid Function Tests: No results for input(s): TSH, T4TOTAL, FREET4, T3FREE, THYROIDAB in the last 72 hours. Anemia Panel: No results for input(s): VITAMINB12, FOLATE, FERRITIN, TIBC, IRON, RETICCTPCT in the last 72 hours. Urine analysis:    Component Value Date/Time   COLORURINE STRAW (A) 02/20/2021 2015   APPEARANCEUR CLEAR 02/20/2021 2015   LABSPEC 1.009 02/20/2021 2015   PHURINE 7.0 02/20/2021 2015   GLUCOSEU NEGATIVE  02/20/2021 2015   HGBUR SMALL (A) 02/20/2021 2015   BILIRUBINUR NEGATIVE 02/20/2021 2015   KETONESUR NEGATIVE 02/20/2021 2015   PROTEINUR NEGATIVE 02/20/2021 2015   UROBILINOGEN 0.2 05/11/2015 0840   NITRITE NEGATIVE 02/20/2021 2015   LEUKOCYTESUR NEGATIVE 02/20/2021 2015   Sepsis Labs: @LABRCNTIP (procalcitonin:4,lacticidven:4)  ) Recent Results (from the past 240 hour(s))  Resp Panel by RT-PCR (Flu A&B, Covid) Nasopharyngeal Swab     Status: None   Collection Time: 06/22/21 10:58 PM   Specimen: Nasopharyngeal Swab; Nasopharyngeal(NP) swabs in vial transport medium  Result Value Ref Range Status   SARS Coronavirus 2 by RT PCR NEGATIVE NEGATIVE Final    Comment: (NOTE) SARS-CoV-2 target nucleic acids are NOT DETECTED.  The SARS-CoV-2 RNA is generally detectable in upper respiratory specimens during the acute phase of infection. The lowest concentration of SARS-CoV-2 viral copies this assay can detect is 138 copies/mL. A negative result does not preclude SARS-Cov-2 infection and  should not be used as the sole basis for treatment or other patient management decisions. A negative result may occur with  improper specimen collection/handling, submission of specimen other than nasopharyngeal swab, presence of viral mutation(s) within the areas targeted by this assay, and inadequate number of viral copies(<138 copies/mL). A negative result must be combined with clinical observations, patient history, and epidemiological information. The expected result is Negative.  Fact Sheet for Patients:  EntrepreneurPulse.com.au  Fact Sheet for Healthcare Providers:  IncredibleEmployment.be  This test is no t yet approved or cleared by the [place] FDA and  has been authorized for detection and/or diagnosis of SARS-CoV-2 by FDA under an Emergency Use Authorization (EUA). This EUA will remain  in effect (meaning this test can be used) for the duration of  the COVID-19 declaration under Section 564(b)(1) of the Act, 21 U.S.C.section 360bbb-3(b)(1), unless the authorization is terminated  or revoked sooner.       Influenza A by PCR NEGATIVE NEGATIVE Final   Influenza B by PCR NEGATIVE NEGATIVE Final    Comment: (NOTE) The Xpert Xpress SARS-CoV-2/FLU/RSV plus assay is intended as an aid in the diagnosis of influenza from Nasopharyngeal swab specimens and should not be used as a sole basis for treatment. Nasal washings and aspirates are unacceptable for Xpert Xpress SARS-CoV-2/FLU/RSV testing.  Fact Sheet for Patients: EntrepreneurPulse.com.au  Fact Sheet for Healthcare Providers: IncredibleEmployment.be  This test is not yet approved or cleared by the [place] FDA and has been authorized for detection and/or diagnosis of SARS-CoV-2 by FDA under an Emergency Use Authorization (EUA). This EUA will remain in effect (meaning this test can be used) for the duration of the COVID-19 declaration under Section 564(b)(1) of the Act, 21 U.S.C. section 360bbb-3(b)(1), unless the authorization is terminated or revoked.  Performed at [facility], [place] [address], [place], [place] [address]       Studies: CT Renal Stone Study  Result Date: 06/22/2021 CLINICAL DATA:  Flank pain. Kidney stones suspected. Patient was discharged earlier today with a Foley catheter with blood in urine bag. EXAM: CT ABDOMEN AND PELVIS WITHOUT CONTRAST TECHNIQUE: Multidetector CT imaging of the abdomen and pelvis was performed following the standard protocol without IV contrast. COMPARISON:  05/16/2015 FINDINGS: Lower chest: Multiple pulmonary nodules are demonstrated. There is a collection of several nodules in the left lingula, largest measuring 1.1 x 2.1 cm diameter. Another nodule is demonstrated in the right costophrenic angle measuring 1 cm diameter. There is also a calcified nodule in the right lung base.  Atelectasis in the lower lungs. Cardiac enlargement. Postoperative cardiac valve repairs. Hepatobiliary: No focal liver lesions identified. Cholelithiasis with small layering stones in the gallbladder. No inflammatory changes. No bile duct dilatation superior Pancreas: Unremarkable. No pancreatic ductal dilatation or surrounding inflammatory changes. Spleen: Spleen is atrophic.  No focal lesions. Adrenals/Urinary Tract: No adrenal gland nodules. Bilateral renal parenchymal atrophy. Multiple cysts in the kidneys are similar to prior study. No hydronephrosis or hydroureter. There is a Foley catheter with balloon in the bladder. Gas is present in the bladder consistent with catheterization. Increased density of the bile is consistent with history of hemorrhage. There is diffuse bladder wall thickening with multiple bladder diverticula and cellule formation. This is likely to represent changes due to chronic outlet obstruction. No bladder stones are identified. Stomach/Bowel: Stomach, small bowel, and colon are not abnormally distended. No wall thickening or inflammatory changes are appreciated. Appendix is not identified. Vascular/Lymphatic: Diffuse aortic calcification. Infrarenal abdominal aortic  aneurysm measuring 5.4 cm AP diameter. This is enlarging since the prior study. No retroperitoneal hematoma or lymphadenopathy. Reproductive: Prostate gland is diffusely enlarged. Other: No free air or free fluid in the abdomen. Abdominal wall musculature appears intact. Musculoskeletal: Degenerative changes in the spine and shoulders. No destructive bone lesions. IMPRESSION: 1. Multiple bilateral pulmonary nodules are demonstrated, largest on the left measuring 1.1 x 2.1 cm diameter. Multiple pulmonary nodules. Most severe: 16 mm left solid pulmonary nodule detected on incomplete chest CT. Recommend prompt non-contrast Chest CT for further evaluation. These guidelines do not apply to immunocompromised patients and patients  with cancer. Follow up in patients with significant comorbidities as clinically warranted. For lung cancer screening, adhere to Lung-RADS guidelines. Reference: Radiology. 2017; 284(1):228-43. 2. Infrarenal abdominal aortic aneurysm measuring 5.4 cm, increasing since prior study. 5.4 cm infrarenal abdominal aortic aneurysm. Recommend follow-up every 6 months and vascular consultation. Reference: J Am Coll Radiol 9794;80:165-537. 3. Cholelithiasis without evidence of acute cholecystitis. 4. Foley catheter in the bladder. Increased density of the urine is likely due to hemorrhage. Multiple bladder diverticula and cellule formation most likely representing chronic outlet obstruction. 5. Prostate gland is diffusely enlarged. 6. Bilateral renal atrophy. No hydronephrosis or hydroureter. No obstructing stones identified. Electronically Signed   By: Lucienne Capers M.D.   On: 06/22/2021 21:34    Scheduled Meds:  amLODipine  5 mg Oral q AM   atorvastatin  10 mg Oral QPM   finasteride  5 mg Oral QPM   pantoprazole  40 mg Oral Daily    Continuous Infusions:  sodium chloride irrigation       LOS: 1 day     Cristal Deer, MD [facility]  To reach me or the doctor on call, go to: [email or web address] Password [email or web address]  06/23/2021, 9:29 AM

## 2021-06-23 NOTE — Progress Notes (Signed)
Patient is anxious about having foley irrigated again. Patient called son and son proceeded to call this nurse. I informed Aurel Nguyen that I was following orders to have line flushed. Line has red drainage into bag and per Dr. Jeffie Pollock note it was flushed until clear earlier in the day. There is an order for continous irrigation in which this nurse will irrigate during the shift to have clear output as ordered.

## 2021-06-24 LAB — TYPE AND SCREEN
ABO/RH(D): O NEG
Antibody Screen: NEGATIVE
Unit division: 0
Unit division: 0
Unit division: 0

## 2021-06-24 LAB — BASIC METABOLIC PANEL
Anion gap: 7 (ref 5–15)
BUN: 28 mg/dL — ABNORMAL HIGH (ref 8–23)
CO2: 27 mmol/L (ref 22–32)
Calcium: 8.5 mg/dL — ABNORMAL LOW (ref 8.9–10.3)
Chloride: 104 mmol/L (ref 98–111)
Creatinine, Ser: 1.26 mg/dL — ABNORMAL HIGH (ref 0.61–1.24)
GFR, Estimated: 51 mL/min — ABNORMAL LOW (ref >60)
Glucose, Bld: 97 mg/dL (ref 70–99)
Potassium: 4.1 mmol/L (ref 3.5–5.1)
Sodium: 138 mmol/L (ref 135–145)

## 2021-06-24 LAB — BPAM RBC
Blood Product Expiration Date: 202210182359
Blood Product Expiration Date: 202210192359
Blood Product Expiration Date: 202211022359
ISSUE DATE / TIME: 202210090051
ISSUE DATE / TIME: 202210091315
ISSUE DATE / TIME: 202210091727
Unit Type and Rh: 9500
Unit Type and Rh: 9500
Unit Type and Rh: 9500

## 2021-06-24 LAB — CBC WITH DIFFERENTIAL/PLATELET
Abs Immature Granulocytes: 0.03 10*3/uL (ref 0.00–0.07)
Basophils Absolute: 0 10*3/uL (ref 0.0–0.1)
Basophils Relative: 0 %
Eosinophils Absolute: 0.3 10*3/uL (ref 0.0–0.5)
Eosinophils Relative: 3 %
HCT: 26.7 % — ABNORMAL LOW (ref 39.0–52.0)
Hemoglobin: 8.5 g/dL — ABNORMAL LOW (ref 13.0–17.0)
Immature Granulocytes: 0 %
Lymphocytes Relative: 33 %
Lymphs Abs: 2.9 10*3/uL (ref 0.7–4.0)
MCH: 26.9 pg (ref 26.0–34.0)
MCHC: 31.8 g/dL (ref 30.0–36.0)
MCV: 84.5 fL (ref 80.0–100.0)
Monocytes Absolute: 0.9 10*3/uL (ref 0.1–1.0)
Monocytes Relative: 11 %
Neutro Abs: 4.5 10*3/uL (ref 1.7–7.7)
Neutrophils Relative %: 53 %
Platelets: 191 10*3/uL (ref 150–400)
RBC: 3.16 MIL/uL — ABNORMAL LOW (ref 4.22–5.81)
RDW: 15.1 % (ref 11.5–15.5)
WBC: 8.7 10*3/uL (ref 4.0–10.5)
nRBC: 0 % (ref 0.0–0.2)

## 2021-06-24 MED ORDER — POLYETHYLENE GLYCOL 3350 17 G PO PACK
8.5000 g | PACK | Freq: Every day | ORAL | Status: DC
  Administered 2021-06-25: 8.5 g via ORAL
  Filled 2021-06-24 (×3): qty 1

## 2021-06-24 NOTE — Progress Notes (Signed)
Called charge nurse on urology floor at [facility] long to irrigate foley. The nurse came up to irrigate with sterile water and was able to flush about 720 ml from foley. A few clots came out with flushing as well. Urine is now pink-tinged with no kinks or clots.

## 2021-06-24 NOTE — Progress Notes (Addendum)
PROGRESS NOTE  Jordan [name] GQQ:[id number] DOB: [birth year] DOA: 06/22/2021 PCP: Leanna Battles, MD  HPI/Recap of past 82 hours: 87 year old male with medical history significant for obstructive uropathy, GERD, coronary artery disease, abdominal aortic aneurysm, anemia of chronic disease, recurrent hematuria, history of radiation therapy, essential hypertension, hyperlipidemia, history of renal mass on the left, history of heart block who has indwelling catheter and was seen in the ER 2 days ago for urinary retention return to the ER yesterday with gross hematuria, his hemoglobin dropped to 6.2 He was transfused with 1 unit of packed RBC overnight  Subjective: June 23, 2021: Patient seen and examined at bedside his family is in the room his son and daughter-in-law patient denies any complaints no pain no nausea vomiting  June 24, 2021: Patient seen and examined Family is at bedside is complaining that he feels little frustrated because he still having blood in his urine and he has been getting intermittent flushing. Urology has seen and he requested to transfer him to 4 E. for continuous bladder irrigation I have explained this to them. Still complaining of constipation has not had a bowel movement since Friday.  He usually uses half a dose of MiraLAX once daily  Assessment/Plan: Principal Problem:   Gross hematuria Active Problems:   Hyperlipemia, mixed   ANEMIA   Coronary atherosclerosis   S/P TAVR (transcatheter aortic valve replacement)   AAA (abdominal aortic aneurysm) without rupture   Hypertension   CKD (chronic kidney disease), stage III ([place])  1.  Gross hematuria Urology consulted he will do bladder irrigation  2.  Severe anemia. Admitting hemoglobin was 6.2 he received 1 unit this morning he went down to 6.0  yesterday.  He received 2 additional unit of packed RBC Current hemoglobin is 8.5  3.  BPH Obstructive uropathy with chronic indwelling Foley  catheter Urology is following  4.  Constipation we will restart him on MiraLAX half a dose daily as per home  Code Status: Full  Severity of Illness: The appropriate patient status for this patient is INPATIENT. Inpatient status is judged to be reasonable and necessary in order to provide the required intensity of service to ensure the patient's safety. The patient's presenting symptoms, physical exam findings, and initial radiographic and laboratory data in the context of their chronic comorbidities is felt to place them at high risk for further clinical deterioration. Furthermore, it is not anticipated that the patient will be medically stable for discharge from the hospital within 2 midnights of admission. The following factors support the patient status of inpatient.   " Gross hematuria and anemia severe   * I certify that at the point of admission it is my clinical judgment that the patient will require inpatient hospital care spanning beyond 2 midnights from the point of admission due to high intensity of service, high risk for further deterioration and high frequency of surveillance required.*   Family Communication: Son and daughter-in-law in the room  Disposition Plan: Home patient lives at home alone and functioning well prior to this admission Status is: Inpatient   Dispo: The patient is from: Home              Anticipated d/c is to:               Anticipated d/c date is:               Patient currently not medically stable for discharge  Consultants: Urology  Procedures: Bladder irrigation  Antimicrobials: None   DVT prophylaxis: SCD   Objective: Vitals:   06/23/21 1540 06/23/21 1730 06/23/21 1744 06/23/21 2001  BP: 122/68 123/68 121/61 (!) 112/58  Pulse: 81 81 83 83  Resp: 16 17 16 16   Temp: 98 F (36.7 C) 98.3 F (36.8 C) 98.4 F (36.9 C) 97.9 F (36.6 C)  TempSrc: Oral Oral Oral Oral  SpO2: 100% 96% 94% 94%  Weight:      Height:         Intake/Output Summary (Last 24 hours) at 06/24/2021 0912 Last data filed at 06/24/2021 0300 Gross per 24 hour  Intake 2098.83 ml  Output 1795 ml  Net 303.83 ml    Filed Weights   06/23/21 0432  Weight: 68.9 kg   Body mass index is 27.8 kg/m.  Exam:  General: 87 y.o. year-old male well developed well nourished in no acute distress.  Alert and oriented x3.  Pleasant in no distress Cardiovascular: Regular rate and rhythm with no rubs or gallops.  No thyromegaly or JVD noted.   Respiratory: Clear to auscultation with no wheezes or rales. Good inspiratory effort. Abdomen: Soft nontender nondistended with normal bowel sounds x4 quadrants. Musculoskeletal: No lower extremity edema. 2/4 pulses in all 4 extremities. Skin: Thick almost fungating mass on his scalp Psychiatry: Mood is appropriate for condition and setting GU: Indwelling Foley catheter with gross hematuria    Data Reviewed: CBC: Recent Labs  Lab 06/22/21 2123 06/23/21 0545 06/23/21 2042 06/24/21 0535  WBC 8.3 5.7  --  8.7  NEUTROABS 5.7  --   --  4.5  HGB 6.2* 6.0* 8.0* 8.5*  HCT 20.5* 19.2* 25.0* 26.7*  MCV 84.7 83.8  --  84.5  PLT 218 177  --  696    Basic Metabolic Panel: Recent Labs  Lab 06/22/21 2123 06/23/21 0545 06/24/21 0535  NA 136 141 138  K 4.0 3.7 4.1  CL 101 106 104  CO2 28 29 27   GLUCOSE 116* 112* 97  BUN 34* 32* 28*  CREATININE 1.66* 1.65* 1.26*  CALCIUM 8.4* 8.4* 8.5*    GFR: Estimated Creatinine Clearance: 26.6 mL/min (A) (by C-G formula based on SCr of 1.26 mg/dL (H)). Liver Function Tests: Recent Labs  Lab 06/23/21 0545  AST 17  ALT 10  ALKPHOS 33*  BILITOT 0.4  PROT 5.6*  ALBUMIN 2.5*    No results for input(s): LIPASE, AMYLASE in the last 168 hours. No results for input(s): AMMONIA in the last 168 hours. Coagulation Profile: Recent Labs  Lab 06/22/21 2258  INR 1.1    Cardiac Enzymes: No results for input(s): CKTOTAL, CKMB, CKMBINDEX, TROPONINI in the  last 168 hours. BNP (last 3 results) No results for input(s): PROBNP in the last 8760 hours. HbA1C: No results for input(s): HGBA1C in the last 72 hours. CBG: No results for input(s): GLUCAP in the last 168 hours. Lipid Profile: No results for input(s): CHOL, HDL, LDLCALC, TRIG, CHOLHDL, LDLDIRECT in the last 72 hours. Thyroid Function Tests: No results for input(s): TSH, T4TOTAL, FREET4, T3FREE, THYROIDAB in the last 72 hours. Anemia Panel: No results for input(s): VITAMINB12, FOLATE, FERRITIN, TIBC, IRON, RETICCTPCT in the last 72 hours. Urine analysis:    Component Value Date/Time   COLORURINE STRAW (A) 02/20/2021 2015   APPEARANCEUR CLEAR 02/20/2021 2015   LABSPEC 1.009 02/20/2021 2015   PHURINE 7.0 02/20/2021 2015   GLUCOSEU NEGATIVE 02/20/2021 2015   HGBUR SMALL (A) 02/20/2021 2015   BILIRUBINUR NEGATIVE 02/20/2021 2015   KETONESUR  NEGATIVE 02/20/2021 2015   PROTEINUR NEGATIVE 02/20/2021 2015   UROBILINOGEN 0.2 05/11/2015 0840   NITRITE NEGATIVE 02/20/2021 2015   LEUKOCYTESUR NEGATIVE 02/20/2021 2015   Sepsis Labs: @LABRCNTIP (procalcitonin:4,lacticidven:4)  ) Recent Results (from the past 240 hour(s))  Resp Panel by RT-PCR (Flu A&B, Covid) Nasopharyngeal Swab     Status: None   Collection Time: 06/22/21 10:58 PM   Specimen: Nasopharyngeal Swab; Nasopharyngeal(NP) swabs in vial transport medium  Result Value Ref Range Status   SARS Coronavirus 2 by RT PCR NEGATIVE NEGATIVE Final    Comment: (NOTE) SARS-CoV-2 target nucleic acids are NOT DETECTED.  The SARS-CoV-2 RNA is generally detectable in upper respiratory specimens during the acute phase of infection. The lowest concentration of SARS-CoV-2 viral copies this assay can detect is 138 copies/mL. A negative result does not preclude SARS-Cov-2 infection and should not be used as the sole basis for treatment or other patient management decisions. A negative result may occur with  improper specimen  collection/handling, submission of specimen other than nasopharyngeal swab, presence of viral mutation(s) within the areas targeted by this assay, and inadequate number of viral copies(<138 copies/mL). A negative result must be combined with clinical observations, patient history, and epidemiological information. The expected result is Negative.  Fact Sheet for Patients:  EntrepreneurPulse.com.au  Fact Sheet for Healthcare Providers:  IncredibleEmployment.be  This test is no t yet approved or cleared by the [place] FDA and  has been authorized for detection and/or diagnosis of SARS-CoV-2 by FDA under an Emergency Use Authorization (EUA). This EUA will remain  in effect (meaning this test can be used) for the duration of the COVID-19 declaration under Section 564(b)(1) of the Act, 21 U.S.C.section 360bbb-3(b)(1), unless the authorization is terminated  or revoked sooner.       Influenza A by PCR NEGATIVE NEGATIVE Final   Influenza B by PCR NEGATIVE NEGATIVE Final    Comment: (NOTE) The Xpert Xpress SARS-CoV-2/FLU/RSV plus assay is intended as an aid in the diagnosis of influenza from Nasopharyngeal swab specimens and should not be used as a sole basis for treatment. Nasal washings and aspirates are unacceptable for Xpert Xpress SARS-CoV-2/FLU/RSV testing.  Fact Sheet for Patients: EntrepreneurPulse.com.au  Fact Sheet for Healthcare Providers: IncredibleEmployment.be  This test is not yet approved or cleared by the [place] FDA and has been authorized for detection and/or diagnosis of SARS-CoV-2 by FDA under an Emergency Use Authorization (EUA). This EUA will remain in effect (meaning this test can be used) for the duration of the COVID-19 declaration under Section 564(b)(1) of the Act, 21 U.S.C. section 360bbb-3(b)(1), unless the authorization is terminated or revoked.  Performed at [facility], [place] [address] [place], [place] [address]       Studies: No results found.  Scheduled Meds:  amLODipine  5 mg Oral q AM   atorvastatin  10 mg Oral QPM   Chlorhexidine Gluconate Cloth  6 each Topical Daily   finasteride  5 mg Oral QPM   ketoconazole   Topical BID   pantoprazole  40 mg Oral Daily    Continuous Infusions:  sodium chloride irrigation       LOS: 2 days     Cristal Deer, MD [facility]  To reach me or the doctor on call, go to: [email or web address] Password [email or web address]  06/24/2021, 9:12 AM

## 2021-06-24 NOTE — Progress Notes (Signed)
Subjective: Patient feels well but frustrated that he continues to have Foley catheter.  Urine is dark bloody this morning with some clots in the tubing.  Is hooked up to a 68 [place] Foley 35 [place] Foley was changed to a 22 [place] three-way hematuria catheter I irrigated a large amount of clot out of the bladder and urine is now clear to manual irrigation also clear to minimal CBI irrigation.  Objective: Vital signs in last 24 hours: Temp:  [97.9 F (36.6 C)-98.5 F (36.9 C)] 98.5 F (36.9 C) (10/10 1434) Pulse Rate:  [74-83] 74 (10/10 1434) Resp:  [16-18] 18 (10/10 1434) BP: (112-128)/(58-75) 128/75 (10/10 1434) SpO2:  [94 %-96 %] 95 % (10/10 1434)  Intake/Output from previous day: 10/09 0701 - 10/10 0700 In: 2338.8 [P.O.:600; I.V.:500; Blood:1238.8] Out: 1795 [IOXBD:5329] Intake/Output this shift: Total I/O In: -  Out: 3400 [Urine:3400]  Physical Exam:  General: Alert and oriented C Lab Results: Recent Labs    06/23/21 0545 06/23/21 2042 06/24/21 0535  HGB 6.0* 8.0* 8.5*  HCT 19.2* 25.0* 26.7*   BMET Recent Labs    06/23/21 0545 06/24/21 0535  NA 141 138  K 3.7 4.1  CL 106 104  CO2 29 27  GLUCOSE 112* 97  BUN 32* 28*  CREATININE 1.65* 1.26*  CALCIUM 8.4* 8.5*     Studies/Results: CT Renal Stone Study  Result Date: 06/22/2021 CLINICAL DATA:  Flank pain. Kidney stones suspected. Patient was discharged earlier today with a Foley catheter with blood in urine bag. EXAM: CT ABDOMEN AND PELVIS WITHOUT CONTRAST TECHNIQUE: Multidetector CT imaging of the abdomen and pelvis was performed following the standard protocol without IV contrast. COMPARISON:  05/16/2015 FINDINGS: Lower chest: Multiple pulmonary nodules are demonstrated. There is a collection of several nodules in the left lingula, largest measuring 1.1 x 2.1 cm diameter. Another nodule is demonstrated in the right costophrenic angle measuring 1 cm diameter. There is also a calcified nodule in the right  lung base. Atelectasis in the lower lungs. Cardiac enlargement. Postoperative cardiac valve repairs. Hepatobiliary: No focal liver lesions identified. Cholelithiasis with small layering stones in the gallbladder. No inflammatory changes. No bile duct dilatation superior Pancreas: Unremarkable. No pancreatic ductal dilatation or surrounding inflammatory changes. Spleen: Spleen is atrophic.  No focal lesions. Adrenals/Urinary Tract: No adrenal gland nodules. Bilateral renal parenchymal atrophy. Multiple cysts in the kidneys are similar to prior study. No hydronephrosis or hydroureter. There is a Foley catheter with balloon in the bladder. Gas is present in the bladder consistent with catheterization. Increased density of the bile is consistent with history of hemorrhage. There is diffuse bladder wall thickening with multiple bladder diverticula and cellule formation. This is likely to represent changes due to chronic outlet obstruction. No bladder stones are identified. Stomach/Bowel: Stomach, small bowel, and colon are not abnormally distended. No wall thickening or inflammatory changes are appreciated. Appendix is not identified. Vascular/Lymphatic: Diffuse aortic calcification. Infrarenal abdominal aortic aneurysm measuring 5.4 cm AP diameter. This is enlarging since the prior study. No retroperitoneal hematoma or lymphadenopathy. Reproductive: Prostate gland is diffusely enlarged. Other: No free air or free fluid in the abdomen. Abdominal wall musculature appears intact. Musculoskeletal: Degenerative changes in the spine and shoulders. No destructive bone lesions. IMPRESSION: 1. Multiple bilateral pulmonary nodules are demonstrated, largest on the left measuring 1.1 x 2.1 cm diameter. Multiple pulmonary nodules. Most severe: 16 mm left solid pulmonary nodule detected on incomplete chest CT. Recommend prompt non-contrast Chest CT for further evaluation. These guidelines do  not apply to immunocompromised patients  and patients with cancer. Follow up in patients with significant comorbidities as clinically warranted. For lung cancer screening, adhere to Lung-RADS guidelines. Reference: Radiology. 2017; 284(1):228-43. 2. Infrarenal abdominal aortic aneurysm measuring 5.4 cm, increasing since prior study. 5.4 cm infrarenal abdominal aortic aneurysm. Recommend follow-up every 6 months and vascular consultation. Reference: J Am Coll Radiol 6144;31:540-086. 3. Cholelithiasis without evidence of acute cholecystitis. 4. Foley catheter in the bladder. Increased density of the urine is likely due to hemorrhage. Multiple bladder diverticula and cellule formation most likely representing chronic outlet obstruction. 5. Prostate gland is diffusely enlarged. 6. Bilateral renal atrophy. No hydronephrosis or hydroureter. No obstructing stones identified. Electronically Signed   By: Lucienne Capers M.D.   On: 06/22/2021 21:34    Assessment/Plan: Gross hematuria: Questionable prostate etiology Plan/recommendation: We will continue CBI overnight and wean to off.  Hopefully urine will stay clear and we will get him back for outpatient cystoscopy in the office after he has had the Foley in for couple of days.  Discussed plan with son today.    LOS: 2 days   Remi Haggard 06/24/2021, 4:44 PM

## 2021-06-25 ENCOUNTER — Encounter (HOSPITAL_COMMUNITY): Payer: Self-pay | Admitting: Internal Medicine

## 2021-06-25 LAB — CBC
HCT: 26.5 % — ABNORMAL LOW (ref 39.0–52.0)
Hemoglobin: 8.5 g/dL — ABNORMAL LOW (ref 13.0–17.0)
MCH: 27.4 pg (ref 26.0–34.0)
MCHC: 32.1 g/dL (ref 30.0–36.0)
MCV: 85.5 fL (ref 80.0–100.0)
Platelets: 177 10*3/uL (ref 150–400)
RBC: 3.1 MIL/uL — ABNORMAL LOW (ref 4.22–5.81)
RDW: 15.3 % (ref 11.5–15.5)
WBC: 9 10*3/uL (ref 4.0–10.5)
nRBC: 0 % (ref 0.0–0.2)

## 2021-06-25 NOTE — Progress Notes (Signed)
  Subjective: Patient feeling better.  Urine essentially remained clear overnight nurses irrigated 1 small clot out manually but urine has since been clear.  Clamped off CBI earlier this morning urine has remained clear.  Hemoglobin is stable.  Objective: Vital signs in last 24 hours: Temp:  [98.2 F (36.8 C)-98.7 F (37.1 C)] 98.7 F (37.1 C) (10/11 0840) Pulse Rate:  [72-80] 77 (10/11 0840) Resp:  [18-22] 18 (10/11 0840) BP: (128-144)/(65-80) 144/80 (10/11 0840) SpO2:  [94 %-96 %] 95 % (10/11 0840)  Intake/Output from previous day: 10/10 0701 - 10/11 0700 In: 3600  Out: 7400 [Urine:7400] Intake/Output this shift: Total I/O In: -  Out: 450 [Urine:450]  Physical Exam:  General: Alert and oriented  Lab Results: Recent Labs    06/23/21 2042 06/24/21 0535 06/25/21 0959  HGB 8.0* 8.5* 8.5*  HCT 25.0* 26.7* 26.5*   BMET Recent Labs    06/23/21 0545 06/24/21 0535  NA 141 138  K 3.7 4.1  CL 106 104  CO2 29 27  GLUCOSE 112* 97  BUN 32* 28*  CREATININE 1.65* 1.26*  CALCIUM 8.4* 8.5*     Studies/Results: No results found.  Assessment/Plan: 1.  Gross hematuria: Likely prostatic source Plan/recommendation: Discontinue CBI, recommend keeping Foley until first of the week and then we will arrange for follow-up for patient as outpatient for Foley removal cystoscopy and voiding trial.  Discussed plan with son.  Okay for discharge home from urology standpoint.  Please let me know if patient to be discharged home so I can arrange outpatient follow-up thanks    LOS: 3 days   Remi Haggard 06/25/2021, 12:01 PM [phone]

## 2021-06-25 NOTE — Care Management Important Message (Deleted)
Important Message  Patient Details IM Letter given to the Patient. Name: Jordan [name] MRN: [id number] Date of Birth: [birth year]   Medicare Important Message Given:  Yes     [name] 06/25/2021, 1:11 PM

## 2021-06-25 NOTE — Progress Notes (Signed)
Patient CBI stopped, cathter plug inserted. Urine began to turn dark red in color, cathter stopped draining. Pt was irrigated and small amount of clots returned. CBI restarted. Urologist and Hospitalist notified of changes.

## 2021-06-25 NOTE — Care Management Important Message (Signed)
Important Message  Patient Details IM Letter placed in Patients room Name: Jordan [name] MRN: [id number] Date of Birth: [birth year]   Medicare Important Message Given:  Yes     [name] 06/25/2021, 1:14 PM

## 2021-06-25 NOTE — Progress Notes (Addendum)
PROGRESS NOTE  Jordan [name] DGL:[id number] DOB: Feb 21, 1918 DOA: 06/22/2021 PCP: Leanna Battles, MD  HPI/Recap of past 85 hours: 87 year old male with medical history significant for obstructive uropathy, GERD, coronary artery disease, abdominal aortic aneurysm, anemia of chronic disease, recurrent hematuria, history of radiation therapy, essential hypertension, hyperlipidemia, history of renal mass on the left, history of heart block who has indwelling catheter and was seen in the ER 2 days ago for urinary retention return to the ER yesterday with gross hematuria, his hemoglobin dropped to 6.2 He was transfused with 1 unit of packed RBC overnight  Subjective: June 23, 2021: Patient seen and examined at bedside his family is in the room his son and daughter-in-law patient denies any complaints no pain no nausea vomiting  June 24, 2021: Patient seen and examined Family is at bedside is complaining that he feels little frustrated because he still having blood in his urine and he has been getting intermittent flushing. Urology has seen and he requested to transfer him to 4 E. for continuous bladder irrigation I have explained this to them. Still complaining of constipation has not had a bowel movement since Friday.  He usually uses half a dose of MiraLAX once daily  June 25, 2021: Patient seen and examined at bedside with his family member son and daughter-in-law. He received continuous bladder irrigation overnight with clearing of his hematuria Urology had stated that he could be discharged home but nurse informed me that low blood urine started to get dark red again and she had to irrigate.  So discharge was not implemented. Possibly go home tomorrow  Assessment/Plan: Principal Problem:   Gross hematuria Active Problems:   Hyperlipemia, mixed   ANEMIA   Coronary atherosclerosis   S/P TAVR (transcatheter aortic valve replacement)   AAA (abdominal aortic aneurysm)  without rupture   Hypertension   CKD (chronic kidney disease), stage III ([place])  1.  Gross hematuria Urology consulted he will do bladder irrigation  2.  Severe anemia. Admitting hemoglobin was 6.2 he received 1 unit this morning he went down to 6.0  yesterday.  He received 2 additional unit of packed RBC Current hemoglobin is 8.5  3.  BPH Obstructive uropathy with chronic indwelling Foley catheter Urology is following  4.  Constipation we will restart him on MiraLAX half a dose daily as per home  Code Status: Full  Severity of Illness: The appropriate patient status for this patient is INPATIENT. Inpatient status is judged to be reasonable and necessary in order to provide the required intensity of service to ensure the patient's safety. The patient's presenting symptoms, physical exam findings, and initial radiographic and laboratory data in the context of their chronic comorbidities is felt to place them at high risk for further clinical deterioration. Furthermore, it is not anticipated that the patient will be medically stable for discharge from the hospital within 2 midnights of admission. The following factors support the patient status of inpatient.   " Gross hematuria and anemia severe   * I certify that at the point of admission it is my clinical judgment that the patient will require inpatient hospital care spanning beyond 2 midnights from the point of admission due to high intensity of service, high risk for further deterioration and high frequency of surveillance required.*   Family Communication: Son and daughter-in-law in the room  Disposition Plan: Home patient lives at home alone and functioning well prior to this admission Status is: Inpatient   Dispo: The patient is  from: Home              Anticipated d/c is to:               Anticipated d/c date is:               Patient currently not medically stable for discharge  Consultants: Urology  Procedures: Bladder  irrigation  Antimicrobials: None   DVT prophylaxis: SCD   Objective: Vitals:   06/24/21 1434 06/24/21 2038 06/25/21 0416 06/25/21 0840  BP: 128/75 131/65 137/69 (!) 144/80  Pulse: 74 72 80 77  Resp: 18 (!) 22 20 18   Temp: 98.5 F (36.9 C) 98.5 F (36.9 C) 98.2 F (36.8 C) 98.7 F (37.1 C)  TempSrc: Oral Oral Oral Oral  SpO2: 95% 96% 94% 95%  Weight:      Height:        Intake/Output Summary (Last 24 hours) at 06/25/2021 0938 Last data filed at 06/25/2021 0700 Gross per 24 hour  Intake 3600 ml  Output 7400 ml  Net -3800 ml    Filed Weights   06/23/21 0432  Weight: 68.9 kg   Body mass index is 27.8 kg/m.  Exam:  General: 87 y.o. year-old male well developed well nourished in no acute distress.  Alert and oriented x3.  Pleasant in no distress Cardiovascular: Regular rate and rhythm with no rubs or gallops.  No thyromegaly or JVD noted.   Respiratory: Clear to auscultation with no wheezes or rales. Good inspiratory effort. Abdomen: Soft nontender nondistended with normal bowel sounds x4 quadrants. Musculoskeletal: No lower extremity edema. 2/4 pulses in all 4 extremities. Skin: No ulcerative lesions noted or rashes, Psychiatry: Mood is appropriate for condition and setting GU: Indwelling Foley catheter with gross hematuria    Data Reviewed: CBC: Recent Labs  Lab 06/22/21 2123 06/23/21 0545 06/23/21 2042 06/24/21 0535  WBC 8.3 5.7  --  8.7  NEUTROABS 5.7  --   --  4.5  HGB 6.2* 6.0* 8.0* 8.5*  HCT 20.5* 19.2* 25.0* 26.7*  MCV 84.7 83.8  --  84.5  PLT 218 177  --  962    Basic Metabolic Panel: Recent Labs  Lab 06/22/21 2123 06/23/21 0545 06/24/21 0535  NA 136 141 138  K 4.0 3.7 4.1  CL 101 106 104  CO2 28 29 27   GLUCOSE 116* 112* 97  BUN 34* 32* 28*  CREATININE 1.66* 1.65* 1.26*  CALCIUM 8.4* 8.4* 8.5*    GFR: Estimated Creatinine Clearance: 26.6 mL/min (A) (by C-G formula based on SCr of 1.26 mg/dL (H)). Liver Function  Tests: Recent Labs  Lab 06/23/21 0545  AST 17  ALT 10  ALKPHOS 33*  BILITOT 0.4  PROT 5.6*  ALBUMIN 2.5*    No results for input(s): LIPASE, AMYLASE in the last 168 hours. No results for input(s): AMMONIA in the last 168 hours. Coagulation Profile: Recent Labs  Lab 06/22/21 2258  INR 1.1    Cardiac Enzymes: No results for input(s): CKTOTAL, CKMB, CKMBINDEX, TROPONINI in the last 168 hours. BNP (last 3 results) No results for input(s): PROBNP in the last 8760 hours. HbA1C: No results for input(s): HGBA1C in the last 72 hours. CBG: No results for input(s): GLUCAP in the last 168 hours. Lipid Profile: No results for input(s): CHOL, HDL, LDLCALC, TRIG, CHOLHDL, LDLDIRECT in the last 72 hours. Thyroid Function Tests: No results for input(s): TSH, T4TOTAL, FREET4, T3FREE, THYROIDAB in the last 72 hours. Anemia Panel: No results for input(s):  VITAMINB12, FOLATE, FERRITIN, TIBC, IRON, RETICCTPCT in the last 72 hours. Urine analysis:    Component Value Date/Time   COLORURINE STRAW (A) 02/20/2021 2015   APPEARANCEUR CLEAR 02/20/2021 2015   LABSPEC 1.009 02/20/2021 2015   PHURINE 7.0 02/20/2021 2015   GLUCOSEU NEGATIVE 02/20/2021 2015   HGBUR SMALL (A) 02/20/2021 2015   BILIRUBINUR NEGATIVE 02/20/2021 2015   KETONESUR NEGATIVE 02/20/2021 2015   PROTEINUR NEGATIVE 02/20/2021 2015   UROBILINOGEN 0.2 05/11/2015 0840   NITRITE NEGATIVE 02/20/2021 2015   LEUKOCYTESUR NEGATIVE 02/20/2021 2015   Sepsis Labs: @LABRCNTIP (procalcitonin:4,lacticidven:4)  ) Recent Results (from the past 240 hour(s))  Resp Panel by RT-PCR (Flu A&B, Covid) Nasopharyngeal Swab     Status: None   Collection Time: 06/22/21 10:58 PM   Specimen: Nasopharyngeal Swab; Nasopharyngeal(NP) swabs in vial transport medium  Result Value Ref Range Status   SARS Coronavirus 2 by RT PCR NEGATIVE NEGATIVE Final    Comment: (NOTE) SARS-CoV-2 target nucleic acids are NOT DETECTED.  The SARS-CoV-2 RNA is generally  detectable in upper respiratory specimens during the acute phase of infection. The lowest concentration of SARS-CoV-2 viral copies this assay can detect is 138 copies/mL. A negative result does not preclude SARS-Cov-2 infection and should not be used as the sole basis for treatment or other patient management decisions. A negative result may occur with  improper specimen collection/handling, submission of specimen other than nasopharyngeal swab, presence of viral mutation(s) within the areas targeted by this assay, and inadequate number of viral copies(<138 copies/mL). A negative result must be combined with clinical observations, patient history, and epidemiological information. The expected result is Negative.  Fact Sheet for Patients:  EntrepreneurPulse.com.au  Fact Sheet for Healthcare Providers:  IncredibleEmployment.be  This test is no t yet approved or cleared by the [place] FDA and  has been authorized for detection and/or diagnosis of SARS-CoV-2 by FDA under an Emergency Use Authorization (EUA). This EUA will remain  in effect (meaning this test can be used) for the duration of the COVID-19 declaration under Section 564(b)(1) of the Act, 21 U.S.C.section 360bbb-3(b)(1), unless the authorization is terminated  or revoked sooner.       Influenza A by PCR NEGATIVE NEGATIVE Final   Influenza B by PCR NEGATIVE NEGATIVE Final    Comment: (NOTE) The Xpert Xpress SARS-CoV-2/FLU/RSV plus assay is intended as an aid in the diagnosis of influenza from Nasopharyngeal swab specimens and should not be used as a sole basis for treatment. Nasal washings and aspirates are unacceptable for Xpert Xpress SARS-CoV-2/FLU/RSV testing.  Fact Sheet for Patients: EntrepreneurPulse.com.au  Fact Sheet for Healthcare Providers: IncredibleEmployment.be  This test is not yet approved or cleared by the [place] FDA  and has been authorized for detection and/or diagnosis of SARS-CoV-2 by FDA under an Emergency Use Authorization (EUA). This EUA will remain in effect (meaning this test can be used) for the duration of the COVID-19 declaration under Section 564(b)(1) of the Act, 21 U.S.C. section 360bbb-3(b)(1), unless the authorization is terminated or revoked.  Performed at [facility], [place] [address], [place], [place] [address]       Studies: No results found.  Scheduled Meds:  amLODipine  5 mg Oral q AM   atorvastatin  10 mg Oral QPM   Chlorhexidine Gluconate Cloth  6 each Topical Daily   finasteride  5 mg Oral QPM   ketoconazole   Topical BID   pantoprazole  40 mg Oral Daily   polyethylene glycol  8.5  g Oral Daily    Continuous Infusions:  sodium chloride irrigation       LOS: 3 days     Cristal Deer, MD [facility]  To reach me or the doctor on call, go to: [email or web address] Password [email or web address]  06/25/2021, 9:38 AM

## 2021-06-25 NOTE — Progress Notes (Deleted)
PROGRESS NOTE  EPIC TRIBBETT PPI:[id number] DOB: [birth year] DOA: 06/22/2021 PCP: Leanna Battles, MD  HPI/Recap of past 24 hours: 87 year old male with medical history significant for obstructive uropathy, GERD, coronary artery disease, abdominal aortic aneurysm, anemia of chronic disease, recurrent hematuria, history of radiation therapy, essential hypertension, hyperlipidemia, history of renal mass on the left, history of heart block who has indwelling catheter and was seen in the ER 2 days ago for urinary retention return to the ER yesterday with gross hematuria, his hemoglobin dropped to 6.2 He was transfused with 1 unit of packed RBC overnight  Subjective: June 23, 2021: Patient seen and examined at bedside his family is in the room his son and daughter-in-law patient denies any complaints no pain no nausea vomiting  June 24, 2021: Patient seen and examined Family is at bedside is complaining that he feels little frustrated because he still having blood in his urine and he has been getting intermittent flushing. Urology has seen and he requested to transfer him to 4 E. for continuous bladder irrigation I have explained this to them. Still complaining of constipation has not had a bowel movement since Friday.  He usually uses half a dose of MiraLAX once daily  June 25, 2021: Patient seen and examined at bedside: Overnight patient had bladder continuous bladder irrigation with removal also several small clots and he had remained clear he was clamped.  This morning he is tubing is clear of blood but he still has a little bit of pink urine in the urinary bladder bag Urology has stated that patient can be discharged home but just spoke with nurse who stated patient started to have dark blood again and she had to irrigate him again so he is back on continuous irrigation this afternoon  Assessment/Plan: Principal Problem:   Gross hematuria Active Problems:   Hyperlipemia,  mixed   ANEMIA   Coronary atherosclerosis   S/P TAVR (transcatheter aortic valve replacement)   AAA (abdominal aortic aneurysm) without rupture   Hypertension   CKD (chronic kidney disease), stage III ([place])  1.  Gross hematuria Urology consulted he will do bladder irrigation  2.  Severe anemia. Admitting hemoglobin was 6.2 he received 1 unit this morning he went down to 6.0  yesterday.  He received 2 additional unit of packed RBC Current hemoglobin is 8.5  3.  BPH Obstructive uropathy with chronic indwelling Foley catheter Urology is following  4.  Constipation we will restart him on MiraLAX half a dose daily as per home  Code Status: Full  Severity of Illness: The appropriate patient status for this patient is INPATIENT. Inpatient status is judged to be reasonable and necessary in order to provide the required intensity of service to ensure the patient's safety. The patient's presenting symptoms, physical exam findings, and initial radiographic and laboratory data in the context of their chronic comorbidities is felt to place them at high risk for further clinical deterioration. Furthermore, it is not anticipated that the patient will be medically stable for discharge from the hospital within 2 midnights of admission. The following factors support the patient status of inpatient.   " Gross hematuria and anemia severe   * I certify that at the point of admission it is my clinical judgment that the patient will require inpatient hospital care spanning beyond 2 midnights from the point of admission due to high intensity of service, high risk for further deterioration and high frequency of surveillance required.*   Family Communication: Son and  daughter-in-law in the room  Disposition Plan: Home patient lives at home alone and functioning well prior to this admission Status is: Inpatient   Dispo: The patient is from: Home              Anticipated d/c is to:                Anticipated d/c date is:               Patient currently not medically stable for discharge  Consultants: Urology  Procedures: Bladder irrigation  Antimicrobials: None   DVT prophylaxis: SCD   Objective: Vitals:   06/24/21 1434 06/24/21 2038 06/25/21 0416 06/25/21 0840  BP: 128/75 131/65 137/69 (!) 144/80  Pulse: 74 72 80 77  Resp: 18 (!) 22 20 18   Temp: 98.5 F (36.9 C) 98.5 F (36.9 C) 98.2 F (36.8 C) 98.7 F (37.1 C)  TempSrc: Oral Oral Oral Oral  SpO2: 95% 96% 94% 95%  Weight:      Height:        Intake/Output Summary (Last 24 hours) at 06/25/2021 1449 Last data filed at 06/25/2021 0940 Gross per 24 hour  Intake 2600 ml  Output 6450 ml  Net -3850 ml    Filed Weights   06/23/21 0432  Weight: 68.9 kg   Body mass index is 27.8 kg/m.  Exam:  General: 87 y.o. year-old male well developed well nourished in no acute distress.  Alert and oriented x3.  Pleasant in no distress Cardiovascular: Regular rate and rhythm with no rubs or gallops.  No thyromegaly or JVD noted.   Respiratory: Clear to auscultation with no wheezes or rales. Good inspiratory effort. Abdomen: Soft nontender nondistended with normal bowel sounds x4 quadrants. Musculoskeletal: No lower extremity edema. 2/4 pulses in all 4 extremities. Skin: Thick almost fungating mass on his scalp Psychiatry: Mood is appropriate for condition and setting GU: Indwelling Foley catheter with gross hematuria    Data Reviewed: CBC: Recent Labs  Lab 06/22/21 2123 06/23/21 0545 06/23/21 2042 06/24/21 0535 06/25/21 0959  WBC 8.3 5.7  --  8.7 9.0  NEUTROABS 5.7  --   --  4.5  --   HGB 6.2* 6.0* 8.0* 8.5* 8.5*  HCT 20.5* 19.2* 25.0* 26.7* 26.5*  MCV 84.7 83.8  --  84.5 85.5  PLT 218 177  --  191 979    Basic Metabolic Panel: Recent Labs  Lab 06/22/21 2123 06/23/21 0545 06/24/21 0535  NA 136 141 138  K 4.0 3.7 4.1  CL 101 106 104  CO2 28 29 27   GLUCOSE 116* 112* 97  BUN 34* 32* 28*   CREATININE 1.66* 1.65* 1.26*  CALCIUM 8.4* 8.4* 8.5*    GFR: Estimated Creatinine Clearance: 26.6 mL/min (A) (by C-G formula based on SCr of 1.26 mg/dL (H)). Liver Function Tests: Recent Labs  Lab 06/23/21 0545  AST 17  ALT 10  ALKPHOS 33*  BILITOT 0.4  PROT 5.6*  ALBUMIN 2.5*    No results for input(s): LIPASE, AMYLASE in the last 168 hours. No results for input(s): AMMONIA in the last 168 hours. Coagulation Profile: Recent Labs  Lab 06/22/21 2258  INR 1.1    Cardiac Enzymes: No results for input(s): CKTOTAL, CKMB, CKMBINDEX, TROPONINI in the last 168 hours. BNP (last 3 results) No results for input(s): PROBNP in the last 8760 hours. HbA1C: No results for input(s): HGBA1C in the last 72 hours. CBG: No results for input(s): GLUCAP in the last 168 hours.  Lipid Profile: No results for input(s): CHOL, HDL, LDLCALC, TRIG, CHOLHDL, LDLDIRECT in the last 72 hours. Thyroid Function Tests: No results for input(s): TSH, T4TOTAL, FREET4, T3FREE, THYROIDAB in the last 72 hours. Anemia Panel: No results for input(s): VITAMINB12, FOLATE, FERRITIN, TIBC, IRON, RETICCTPCT in the last 72 hours. Urine analysis:    Component Value Date/Time   COLORURINE STRAW (A) 02/20/2021 2015   APPEARANCEUR CLEAR 02/20/2021 2015   LABSPEC 1.009 02/20/2021 2015   PHURINE 7.0 02/20/2021 2015   GLUCOSEU NEGATIVE 02/20/2021 2015   HGBUR SMALL (A) 02/20/2021 2015   BILIRUBINUR NEGATIVE 02/20/2021 2015   KETONESUR NEGATIVE 02/20/2021 2015   PROTEINUR NEGATIVE 02/20/2021 2015   UROBILINOGEN 0.2 05/11/2015 0840   NITRITE NEGATIVE 02/20/2021 2015   LEUKOCYTESUR NEGATIVE 02/20/2021 2015   Sepsis Labs: @LABRCNTIP (procalcitonin:4,lacticidven:4)  ) Recent Results (from the past 240 hour(s))  Resp Panel by RT-PCR (Flu A&B, Covid) Nasopharyngeal Swab     Status: None   Collection Time: 06/22/21 10:58 PM   Specimen: Nasopharyngeal Swab; Nasopharyngeal(NP) swabs in vial transport medium  Result  Value Ref Range Status   SARS Coronavirus 2 by RT PCR NEGATIVE NEGATIVE Final    Comment: (NOTE) SARS-CoV-2 target nucleic acids are NOT DETECTED.  The SARS-CoV-2 RNA is generally detectable in upper respiratory specimens during the acute phase of infection. The lowest concentration of SARS-CoV-2 viral copies this assay can detect is 138 copies/mL. A negative result does not preclude SARS-Cov-2 infection and should not be used as the sole basis for treatment or other patient management decisions. A negative result may occur with  improper specimen collection/handling, submission of specimen other than nasopharyngeal swab, presence of viral mutation(s) within the areas targeted by this assay, and inadequate number of viral copies(<138 copies/mL). A negative result must be combined with clinical observations, patient history, and epidemiological information. The expected result is Negative.  Fact Sheet for Patients:  EntrepreneurPulse.com.au  Fact Sheet for Healthcare Providers:  IncredibleEmployment.be  This test is no t yet approved or cleared by the [place] FDA and  has been authorized for detection and/or diagnosis of SARS-CoV-2 by FDA under an Emergency Use Authorization (EUA). This EUA will remain  in effect (meaning this test can be used) for the duration of the COVID-19 declaration under Section 564(b)(1) of the Act, 21 U.S.C.section 360bbb-3(b)(1), unless the authorization is terminated  or revoked sooner.       Influenza A by PCR NEGATIVE NEGATIVE Final   Influenza B by PCR NEGATIVE NEGATIVE Final    Comment: (NOTE) The Xpert Xpress SARS-CoV-2/FLU/RSV plus assay is intended as an aid in the diagnosis of influenza from Nasopharyngeal swab specimens and should not be used as a sole basis for treatment. Nasal washings and aspirates are unacceptable for Xpert Xpress SARS-CoV-2/FLU/RSV testing.  Fact Sheet for  Patients: EntrepreneurPulse.com.au  Fact Sheet for Healthcare Providers: IncredibleEmployment.be  This test is not yet approved or cleared by the [place] FDA and has been authorized for detection and/or diagnosis of SARS-CoV-2 by FDA under an Emergency Use Authorization (EUA). This EUA will remain in effect (meaning this test can be used) for the duration of the COVID-19 declaration under Section 564(b)(1) of the Act, 21 U.S.C. section 360bbb-3(b)(1), unless the authorization is terminated or revoked.  Performed at [facility], [place] [address], [place], [place] [address]       Studies: No results found.  Scheduled Meds:  amLODipine  5 mg Oral q AM   atorvastatin  10 mg Oral  QPM   Chlorhexidine Gluconate Cloth  6 each Topical Daily   finasteride  5 mg Oral QPM   ketoconazole   Topical BID   pantoprazole  40 mg Oral Daily   polyethylene glycol  8.5 g Oral Daily    Continuous Infusions:  sodium chloride irrigation       LOS: 3 days     Cristal Deer, MD [facility]  To reach me or the doctor on call, go to: [email or web address] Password [email or web address]  06/25/2021, 2:49 PM

## 2021-06-26 DIAGNOSIS — D649 Anemia, unspecified: Secondary | ICD-10-CM

## 2021-06-26 DIAGNOSIS — I10 Essential (primary) hypertension: Secondary | ICD-10-CM

## 2021-06-26 DIAGNOSIS — I714 Abdominal aortic aneurysm, without rupture, unspecified: Secondary | ICD-10-CM

## 2021-06-26 DIAGNOSIS — N1831 Chronic kidney disease, stage 3a: Secondary | ICD-10-CM

## 2021-06-26 DIAGNOSIS — I2510 Atherosclerotic heart disease of native coronary artery without angina pectoris: Secondary | ICD-10-CM

## 2021-06-26 DIAGNOSIS — Z952 Presence of prosthetic heart valve: Secondary | ICD-10-CM

## 2021-06-26 DIAGNOSIS — E782 Mixed hyperlipidemia: Secondary | ICD-10-CM

## 2021-06-26 LAB — CBC
HCT: 25.6 % — ABNORMAL LOW (ref 39.0–52.0)
Hemoglobin: 7.9 g/dL — ABNORMAL LOW (ref 13.0–17.0)
MCH: 27.2 pg (ref 26.0–34.0)
MCHC: 30.9 g/dL (ref 30.0–36.0)
MCV: 88.3 fL (ref 80.0–100.0)
Platelets: 180 10*3/uL (ref 150–400)
RBC: 2.9 MIL/uL — ABNORMAL LOW (ref 4.22–5.81)
RDW: 15.5 % (ref 11.5–15.5)
WBC: 9 10*3/uL (ref 4.0–10.5)
nRBC: 0 % (ref 0.0–0.2)

## 2021-06-26 LAB — BASIC METABOLIC PANEL
Anion gap: 6 (ref 5–15)
BUN: 22 mg/dL (ref 8–23)
CO2: 26 mmol/L (ref 22–32)
Calcium: 8.1 mg/dL — ABNORMAL LOW (ref 8.9–10.3)
Chloride: 103 mmol/L (ref 98–111)
Creatinine, Ser: 1.26 mg/dL — ABNORMAL HIGH (ref 0.61–1.24)
GFR, Estimated: 51 mL/min — ABNORMAL LOW (ref >60)
Glucose, Bld: 139 mg/dL — ABNORMAL HIGH (ref 70–99)
Potassium: 4 mmol/L (ref 3.5–5.1)
Sodium: 135 mmol/L (ref 135–145)

## 2021-06-26 MED ORDER — CLOPIDOGREL BISULFATE 75 MG PO TABS
75.0000 mg | ORAL_TABLET | Freq: Every day | ORAL | Status: DC
  Administered 2021-06-26 – 2021-06-29 (×4): 75 mg via ORAL
  Filled 2021-06-26 (×4): qty 1

## 2021-06-26 NOTE — Progress Notes (Addendum)
PROGRESS NOTE  Jordan [name] RCV:[id number] DOB: 01-10-1918 DOA: 06/22/2021 PCP: Leanna Battles, MD   LOS: 4 days   Brief narrative:  Jordan [name] is a 87 years old male with past medical history of uropathy, GERD, coronary artery disease,  abdominal aortic aneurysm, anemia of chronic disease, recurrent hematuria, history of radiation therapy, essential hypertension, hyperlipidemia, history of renal mass on the left, history of heart block with indwelling Foley catheter was brought into the hospital with urinary retention and was noted to have gross hematuria with blood clots.  Hemoglobin had dropped significantly to 6.2 from previous hemoglobin of 10.9.  Urology was consulted and patient was started on continuous bladder irrigation.  Initial labs showed anemia with elevated creatinine at 1.6.He was then admitted to the hospital for further evaluation and treatment.   Assessment/Plan:  Principal Problem:   Gross hematuria Active Problems:   Hyperlipemia, mixed   ANEMIA   Coronary atherosclerosis   S/P TAVR (transcatheter aortic valve replacement)   AAA (abdominal aortic aneurysm) without rupture   Hypertension   CKD (chronic kidney disease), stage III ([facility])  Gross hematuria:  With indwelling Foley catheter at baseline.  Has recurrent hematuria.  Urology has seen the patient and received continuous bladder irrigation during hospitalization.  Currently urine has started to clear except for irrigating for small clot yesterday.  Patient received PRBC transfusion x1 in the hospital.  Latest hemoglobin of 7.9.  Urology recommends continuation of Foley catheter at least for 1 week and follow-up in the clinic for possible cystoscopy.  Patient was supposed to hold Plavix for 5 days which he has hold.  Will restart from tonight.  Coronary artery disease:  No chest pain.  No active issues at this time.  Hemoglobin of 7.9 at this time.    Essential hypertension: Continue amlodipine.   Continue to hold diuretics.   Hyperlipidemia: Continue with statin   Acute blood loss anemia secondary to hematuria.  We will continue to monitor.  Hematuria has started to clear.  Chronic kidney disease stage III: We will need to continue to monitor closely.  Debility, deconditioning, patient was seen by physical therapy who recommended skilled nursing facility placement.  Patient was unsafe for discharge.  DVT prophylaxis: SCDs Start: 06/22/21 2358   Code Status: Full code  Family Communication: I spoke with the patient's family at bedside.  Status is: Inpatient  Remains inpatient appropriate because:Unsafe d/c plan, IV treatments appropriate due to intensity of illness or inability to take PO, and Inpatient level of care appropriate due to severity of illness, need for rehabilitation  Dispo: The patient is from: Home              Anticipated d/c is to: SNF as per PT evaluation.              Patient currently is not medically stable to d/c.   Difficult to place patient No Consultants: Urology  Procedures: Continuous bladder irrigation  Anti-infectives:  None  Anti-infectives (From admission, onward)    None      Subjective: Today, patient was seen and examined at bedside.  Had to be flushed yesterday for blood clot in the urinary catheter.  Largely clear urine at this time.  Received continuous bladder irrigation.  Physical therapy has recommended skilled nursing facility placement.  Objective: Vitals:   06/25/21 2116 06/26/21 0446  BP: (!) 146/64 134/78  Pulse: 71 79  Resp: 18 18  Temp: 98.2 F (36.8 C) 98.3 F (36.8 C)  SpO2: 98% 96%    Intake/Output Summary (Last 24 hours) at 06/26/2021 1337 Last data filed at 06/26/2021 0944 Gross per 24 hour  Intake 6000 ml  Output 11700 ml  Net -5700 ml   Filed Weights   06/23/21 0432  Weight: 68.9 kg   Body mass index is 27.8 kg/m.   Physical Exam: GENERAL: Patient is alert awake and oriented. Not in  obvious distress. HENT: Pallor noted.  Pupils equally reactive to light. Oral mucosa is moist NECK: is supple, no gross swelling noted. CHEST: Clear to auscultation. No crackles or wheezes.  Diminished breath sounds bilaterally. CVS: S1 and S2 heard, no murmur. Regular rate and rhythm.   ABDOMEN: Soft, non-tender, bowel sounds are present.  Foley catheter in place. EXTREMITIES: No edema. CNS: Cranial nerves are intact.  Moves all extremities. SKIN: warm and dry without rashes.  Data Review: I have personally reviewed the following laboratory data and studies,  CBC: Recent Labs  Lab 06/22/21 2123 06/23/21 0545 06/23/21 2042 06/24/21 0535 06/25/21 0959 06/26/21 0338  WBC 8.3 5.7  --  8.7 9.0 9.0  NEUTROABS 5.7  --   --  4.5  --   --   HGB 6.2* 6.0* 8.0* 8.5* 8.5* 7.9*  HCT 20.5* 19.2* 25.0* 26.7* 26.5* 25.6*  MCV 84.7 83.8  --  84.5 85.5 88.3  PLT 218 177  --  191 177 332   Basic Metabolic Panel: Recent Labs  Lab 06/22/21 2123 06/23/21 0545 06/24/21 0535 06/26/21 0338  NA 136 141 138 135  K 4.0 3.7 4.1 4.0  CL 101 106 104 103  CO2 28 29 27 26   GLUCOSE 116* 112* 97 139*  BUN 34* 32* 28* 22  CREATININE 1.66* 1.65* 1.26* 1.26*  CALCIUM 8.4* 8.4* 8.5* 8.1*   Liver Function Tests: Recent Labs  Lab 06/23/21 0545  AST 17  ALT 10  ALKPHOS 33*  BILITOT 0.4  PROT 5.6*  ALBUMIN 2.5*   No results for input(s): LIPASE, AMYLASE in the last 168 hours. No results for input(s): AMMONIA in the last 168 hours. Cardiac Enzymes: No results for input(s): CKTOTAL, CKMB, CKMBINDEX, TROPONINI in the last 168 hours. BNP (last 3 results) No results for input(s): BNP in the last 8760 hours.  ProBNP (last 3 results) No results for input(s): PROBNP in the last 8760 hours.  CBG: No results for input(s): GLUCAP in the last 168 hours. Recent Results (from the past 240 hour(s))  Resp Panel by RT-PCR (Flu A&B, Covid) Nasopharyngeal Swab     Status: None   Collection Time: 06/22/21  10:58 PM   Specimen: Nasopharyngeal Swab; Nasopharyngeal(NP) swabs in vial transport medium  Result Value Ref Range Status   SARS Coronavirus 2 by RT PCR NEGATIVE NEGATIVE Final    Comment: (NOTE) SARS-CoV-2 target nucleic acids are NOT DETECTED.  The SARS-CoV-2 RNA is generally detectable in upper respiratory specimens during the acute phase of infection. The lowest concentration of SARS-CoV-2 viral copies this assay can detect is 138 copies/mL. A negative result does not preclude SARS-Cov-2 infection and should not be used as the sole basis for treatment or other patient management decisions. A negative result may occur with  improper specimen collection/handling, submission of specimen other than nasopharyngeal swab, presence of viral mutation(s) within the areas targeted by this assay, and inadequate number of viral copies(<138 copies/mL). A negative result must be combined with clinical observations, patient history, and epidemiological information. The expected result is Negative.  Fact Sheet for Patients:  EntrepreneurPulse.com.au  Fact Sheet for Healthcare Providers:  IncredibleEmployment.be  This test is no t yet approved or cleared by the [place] FDA and  has been authorized for detection and/or diagnosis of SARS-CoV-2 by FDA under an Emergency Use Authorization (EUA). This EUA will remain  in effect (meaning this test can be used) for the duration of the COVID-19 declaration under Section 564(b)(1) of the Act, 21 U.S.C.section 360bbb-3(b)(1), unless the authorization is terminated  or revoked sooner.       Influenza A by PCR NEGATIVE NEGATIVE Final   Influenza B by PCR NEGATIVE NEGATIVE Final    Comment: (NOTE) The Xpert Xpress SARS-CoV-2/FLU/RSV plus assay is intended as an aid in the diagnosis of influenza from Nasopharyngeal swab specimens and should not be used as a sole basis for treatment. Nasal washings and aspirates  are unacceptable for Xpert Xpress SARS-CoV-2/FLU/RSV testing.  Fact Sheet for Patients: EntrepreneurPulse.com.au  Fact Sheet for Healthcare Providers: IncredibleEmployment.be  This test is not yet approved or cleared by the [place] FDA and has been authorized for detection and/or diagnosis of SARS-CoV-2 by FDA under an Emergency Use Authorization (EUA). This EUA will remain in effect (meaning this test can be used) for the duration of the COVID-19 declaration under Section 564(b)(1) of the Act, 21 U.S.C. section 360bbb-3(b)(1), unless the authorization is terminated or revoked.  Performed at [facility], [place] [address], [place], [place] [address]      Studies: No results found.   Flora Lipps, MD  [facility] 06/26/2021  If 7PM-7AM, please contact night-coverage

## 2021-06-26 NOTE — TOC Progression Note (Addendum)
Transition of Care [facility]) - Progression Note    Patient Details  Name: Jordan [name] MRN: [id number] Date of Birth: 16-Jan-1918  [facility]) CM/SW Contact  Leeroy Cha, RN Phone Number: 06/26/2021, 12:59 PM  Clinical Narrative:    Spoke with patient is willing to go to snf for rehab. Tct-family for snf wishes for placement. Tct-son [name]-does not have preference  Will fax out to area snf per conversation with physical therapy.         Expected Discharge Plan and Services                                                 Social Determinants of Health (SDOH) Interventions    Readmission Risk Interventions No flowsheet data found.

## 2021-06-26 NOTE — Progress Notes (Signed)
This Rn had to irrigate three way catheter x1 time during shift r/t blood clot obstruction. Pt expressed distention/discomfort that was relieved after obstruction passed. Small blood clots visualized in catheter with pink urine noted. No complaints of distention/pain at this time. CBI continues. See flowsheets for input/output

## 2021-06-26 NOTE — Evaluation (Signed)
Physical Therapy Evaluation Patient Details Name: Jordan [name] MRN: [id number] DOB: 1918-04-29 Today's Date: 06/26/2021  History of Present Illness   Patient is 87 y.o. male with chronic indwelling catheter who returned to [facility] for c/o urinary retention gross hematuria, and drop in hemoglobin. PMH significant for BPH, HTN, CAD, OA, HLD, pacemaker. Patient admitted and initiated CBI for management of hematuria.    Clinical Impression  OCIE TINO is 87 y.o. male admitted with above HPI and diagnosis. Patient is currently limited by functional impairments below (see PT problem list). Patient lives alone and pt/family report he is independent with [facility] for household mobility at baseline. Patient reports he was driving and completing ADL's independently. Pt/family deny falls in the last 6 months. Patient was able to ambulate short distance with RW and required min-mod assist and Mod assist for sit<>stand transfers. He was limited by weakness, decreased endurance, and bil knee pain Rt>Lt. Pt may benefit from orthopedic consult as he has previously had success with knee injections to alleviate pain. His family is unable to provide daily or 24/7 assistance. Patient will benefit from continued skilled PT interventions to address impairments and progress independence with mobility, recommending SNF. Acute PT will follow and progress as able.        Recommendations for follow up therapy are one component of a multi-disciplinary discharge planning process, led by the attending physician.  Recommendations may be updated based on patient status, additional functional criteria and insurance authorization.  Follow Up Recommendations  SNF    Equipment Recommendations    Rolling walker with 5" wheels;   Recommendations for Other Services   OT consult;    Precautions / Restrictions   Fall       06/26/21 1000  PT Visit Information  Last PT Received On 06/26/21  Assistance Needed +1   History of Present Illness Patient is 87 y.o. male with chronic indwelling catheter who [facility] for c/o urinary retention gross hematuria, and drop in hemoglobin. PMH significant for BPH, HTN, CAD, OA, HLD, pacemaker. Patient admitted and initiated CBI for management of hematuria.  Precautions  Precautions Fall  Restrictions  Weight Bearing Restrictions No  Home Living  Family/patient expects to be discharged to: Private residence  Living Arrangements Alone  Available Help at Discharge Family  Type of [place] to enter  Entrance Stairs-Number of Steps 2+2 or 2+3  Bathroom Biomedical scientist Yes  Home Equipment [place] - 2 wheels;Walker - 4 wheels;Cane - single point  Additional Comments pt's son and daughter-in-law come over 2-3 times a week. pt drives his car in themorning to get sandwich. (4-5 blocks away). he drives to the grocery store and uses scoot to food shop. pt takes care of his cockatoo.  Prior Function  Level of Independence Independent with assistive device(s)  Comments pt reports typically independent with a SPC for mobility and that he performs sink baths and hasn't been in his shower in ~6 months or more.  Communication  Communication HOH  Pain Assessment  Pain Assessment Faces  Faces Pain Scale 0  Pain Intervention(s) Monitored during session  Cognition  Arousal/Alertness Awake/alert  Behavior During Therapy WFL for tasks assessed/performed  Overall Cognitive Status Within Functional Limits for tasks assessed  Upper Extremity Assessment  Upper Extremity Assessment Generalized weakness  Lower Extremity Assessment  Lower Extremity Assessment Generalized weakness  Cervical / Trunk Assessment  Cervical / Trunk Assessment Kyphotic  Bed Mobility  Overal bed mobility Needs Assistance  Bed Mobility Supine to Sit  Supine to sit Min assist;HOB elevated  General bed mobility  comments cues to use bed rail/sequence move. Assist to bring trunk fully upright and use of bed pad to scoot forward.  Transfers  Overall transfer level Needs assistance  Equipment used Rolling walker (2 wheeled)  Transfers Sit to/from [facility]  Sit to Stand Mod assist  Stand pivot transfers Min assist  General transfer comment Pt required mod assist to rise from EOB and cues for hand placement with power up. Min assist to rise from recliner with bil UE's used on armrests for power up. pt required Min assist to manage RW with pivot to move bed>chair and cues for reach back to control lowering.  Ambulation/Gait  Ambulation/Gait assistance Min assist;Mod assist  Gait Distance (Feet) 8 Feet  Assistive device Rolling walker (2 wheeled)  Gait Pattern/deviations Step-to pattern;Decreased stride length;Decreased step length - right;Decreased step length - left;Shuffle;Trunk flexed  General Gait Details cues for safe position of RW and Min-Mod assist to maintain, pt ambualted short distance to hall, posture flexed and hips/knee flexed. Crouched posture increased as pt fatigued and seated rest provided.  Gait velocity decr  PT - End of Session  Equipment Utilized During Treatment Gait belt  Activity Tolerance Patient tolerated treatment well  Patient left in chair;with call bell/phone within reach;with chair alarm set;with family/visitor present  Nurse Communication Mobility status  PT Assessment  PT Recommendation/Assessment Patient needs continued PT services  PT Visit Diagnosis Muscle weakness (generalized) (M62.81);Difficulty in walking, not elsewhere classified (R26.2);Unsteadiness on feet (R26.81)  PT Problem List Decreased strength;Decreased range of motion;Decreased activity tolerance;Decreased mobility;Decreased balance;Decreased knowledge of use of DME;Decreased knowledge of precautions;Pain  PT Plan  PT Frequency (ACUTE ONLY) 7X/week  PT Treatment/Interventions (ACUTE  ONLY) DME instruction;Stair training;Gait training;Functional mobility training;Therapeutic activities;Therapeutic exercise;Balance training;Patient/family education  AM-PAC PT "6 Clicks" Mobility Outcome Measure (Version 2)  Help needed turning from your back to your side while in a flat bed without using bedrails? 3  Help needed moving from lying on your back to sitting on the side of a flat bed without using bedrails? 3  Help needed moving to and from a bed to a chair (including a wheelchair)? 2  Help needed standing up from a chair using your arms (e.g., wheelchair or bedside chair)? 2  Help needed to walk in hospital room? 2  Help needed climbing 3-5 steps with a railing?  1  6 Click Score 13  Consider Recommendation of Discharge To: CIR/SNF/LTACH  Progressive Mobility  What is the highest level of mobility based on the progressive mobility assessment? Level 3 (Stands with assist) - Balance while standing  and cannot march in place  Mobility Out of bed to chair with meals;Ambulated with assistance in room;Ambulated with assistance in hallway;Out of bed for toileting  PT Recommendation  Recommendations for Other Services OT consult  Follow Up Recommendations SNF;Supervision for mobility/OOB  PT equipment Rolling walker with 5" wheels  Individuals Consulted  Consulted and Agree with Results and Recommendations Patient;Family member/caregiver  Family Member Consulted pt's son and daughter in law  Acute Rehab PT Goals  Patient Stated Goal go home  PT Goal Formulation With patient/family  Time For Goal Achievement 07/10/21  Potential to Achieve Goals Fair  PT Time Calculation  PT Start Time (ACUTE ONLY) 1004  PT Stop Time (ACUTE ONLY) 1103  PT Time Calculation (min) (ACUTE ONLY) 59 min  PT General Charges  $$ ACUTE PT VISIT 1 Visit  PT Evaluation  $PT Eval Moderate Complexity 1 Mod  PT Treatments  $Gait Training 8-22 mins  $Therapeutic Activity 8-22 mins  $Self Care/Home  Management 8-22    Verner Mould, DPT Acute Rehabilitation Services Office [phone] Pager [phone]   Jacques Navy 06/26/2021, 6:05 PM

## 2021-06-26 NOTE — Progress Notes (Signed)
RN and NT assisted patient in using walker to ambulate approximately 65 feet.  Patient tolerated very well with standby assistance.

## 2021-06-26 NOTE — NC FL2 (Signed)
[place] LEVEL OF CARE SCREENING TOOL     IDENTIFICATION  Patient Name: Jordan [name] Birthdate: [birth year] Sex: male Admission Date (Current Location): 06/22/2021  [facility] and [place] Number:  Herbalist and Address:  [facility],  [place] [address], [place]      Provider Number: [id number]  Attending Physician Name and Address:  Flora Lipps, MD  Relative Name and Phone Number:       Current Level of Care: Hospital Recommended Level of Care: [place] Prior Approval Number:    Date Approved/Denied:   PASRR Number: [id number]  Discharge Plan: SNF    Current Diagnoses: Patient Active Problem List   Diagnosis Date Noted   Gross hematuria 06/22/2021   CKD (chronic kidney disease), stage III ([place]) 06/22/2021   Complete heart block ([place]) 09/21/2020   Hypertension 09/21/2020   Squamous cell carcinoma of skin of scalp 05/09/2020   Squamous cell cancer of multiple sites of skin of upper arm, right 05/09/2020   Cystoid macular edema of right eye 02/20/2020   Early stage nonexudative age-related macular degeneration of both eyes 02/20/2020   Macular pucker, right eye 02/20/2020   Syncope 09/24/2019   AAA (abdominal aortic aneurysm) without rupture 08/14/2015   Severe aortic valve stenosis 05/15/2015   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   Aortic stenosis, severe 05/03/2015   Chest pain 03/09/2015   Abdominal aneurysm without mention of rupture 02/24/2013   Pulmonary nodule, right 02/10/2013   Mobitz type II atrioventricular block 03/09/2012   Effusion of olecranon bursa, left 01/28/2012   Aortic stenosis 05/06/2011   Cardiac pacemaker in situ 10/29/2010   ABDOMINAL PAIN-LUQ 06/21/2008   PERSONAL HX COLONIC POLYPS 06/21/2008   Hyperlipemia, mixed 06/19/2008   ANEMIA 06/19/2008   Coronary atherosclerosis 06/19/2008    Orientation RESPIRATION BLADDER Height & Weight      Self, Time, Situation, Place  Normal Indwelling catheter Weight: 68.9 kg (per patient) Height:  5\' 2"  (157.5 cm)  BEHAVIORAL SYMPTOMS/MOOD NEUROLOGICAL BOWEL NUTRITION STATUS      Continent Diet (regular)  AMBULATORY STATUS COMMUNICATION OF NEEDS Skin   Extensive Assist Verbally Normal                       Personal Care Assistance Level of Assistance  Bathing, Feeding, Dressing Bathing Assistance: Limited assistance Feeding assistance: Limited assistance Dressing Assistance: Limited assistance     Functional Limitations Info  Sight, Hearing, Speech Sight Info: Impaired Hearing Info: Adequate Speech Info: Adequate    SPECIAL CARE FACTORS FREQUENCY  PT (By licensed PT), OT (By licensed OT)     PT Frequency: 5 x weekly OT Frequency: 5 x weekly            Contractures Contractures Info: Not present    Additional Factors Info  Code Status Code Status Info: full             Current Medications (06/26/2021):  This is the current hospital active medication list Current Facility-Administered Medications  Medication Dose Route Frequency Provider Last Rate Last Admin   acetaminophen (TYLENOL) tablet 650 mg  650 mg Oral Q6H PRN Elwyn Reach, MD   650 mg at 06/26/21 1005   amLODipine (NORVASC) tablet 5 mg  5 mg Oral q AM Gala Romney L, MD   5 mg at 06/26/21 0600   atorvastatin (LIPITOR) tablet 10 mg  10 mg Oral QPM Elwyn Reach, MD  10 mg at 06/25/21 1900   Chlorhexidine Gluconate Cloth 2 % PADS 6 each  6 each Topical Daily Cristal Deer, MD   6 each at 06/25/21 1049   finasteride (PROSCAR) tablet 5 mg  5 mg Oral QPM Gala Romney L, MD   5 mg at 06/25/21 1900   ketoconazole (NIZORAL) 2 % cream   Topical BID Cristal Deer, MD   Given at 06/25/21 1049   ondansetron (ZOFRAN) tablet 4 mg  4 mg Oral Q6H PRN Elwyn Reach, MD       Or   ondansetron (ZOFRAN) injection 4 mg  4 mg Intravenous Q6H PRN Elwyn Reach, MD       pantoprazole  (PROTONIX) EC tablet 40 mg  40 mg Oral Daily Gala Romney L, MD   40 mg at 06/26/21 1005   polyethylene glycol (MIRALAX / GLYCOLAX) packet 8.5 g  8.5 g Oral Daily Cristal Deer, MD   8.5 g at 06/25/21 1049   sodium chloride irrigation 0.9 % 3,000 mL  3,000 mL Irrigation Continuous Domenic Moras, PA-C   Stopped at 06/26/21 1959     Discharge Medications: Please see discharge summary for a list of discharge medications.  Relevant Imaging Results:  Relevant Lab Results:   Additional Information ssn:[id number]  Leeroy Cha, RN

## 2021-06-26 NOTE — Progress Notes (Signed)
  Subjective: Patient feeling better but continues to be weak and has not been ambulating much during the hospitalization.  Urine looks clear today off CBI.  Nurse did have to irrigate the catheter yesterday and remove 1 small clot but has not required CBI today.  I irrigated the Foley no clots and returns clear.  Objective: Vital signs in last 24 hours: Temp:  [97.5 F (36.4 C)-98.3 F (36.8 C)] 98.3 F (36.8 C) (10/12 0446) Pulse Rate:  [71-79] 79 (10/12 0446) Resp:  [16-18] 18 (10/12 0446) BP: (121-146)/(60-78) 134/78 (10/12 0446) SpO2:  [93 %-98 %] 96 % (10/12 0446)  Intake/Output from previous day: 10/11 0701 - 10/12 0700 In: 6000  Out: 11050 [Urine:11050] Intake/Output this shift: Total I/O In: -  Out: 1100 [Urine:1100]  Physical Exam:  General: Alert and oriented  Lab Results: Recent Labs    06/24/21 0535 06/25/21 0959 06/26/21 0338  HGB 8.5* 8.5* 7.9*  HCT 26.7* 26.5* 25.6*   BMET Recent Labs    06/24/21 0535 06/26/21 0338  NA 138 135  K 4.1 4.0  CL 104 103  CO2 27 26  GLUCOSE 97 139*  BUN 28* 22  CREATININE 1.26* 1.26*  CALCIUM 8.5* 8.1*     Studies/Results: No results found.  Assessment/Plan: 1.  Gross hematuria: Likely prostatic source. Plan recommendation: Okay from discharge home with Foley.  If patient to be discharged home notify me so I can arrange for outpatient follow-up for Foley removal and cystoscopy same day next week.    LOS: 4 days   Remi Haggard [phone] 06/26/2021, 12:09 PM

## 2021-06-27 LAB — CBC
HCT: 25.1 % — ABNORMAL LOW (ref 39.0–52.0)
Hemoglobin: 7.8 g/dL — ABNORMAL LOW (ref 13.0–17.0)
MCH: 27.2 pg (ref 26.0–34.0)
MCHC: 31.1 g/dL (ref 30.0–36.0)
MCV: 87.5 fL (ref 80.0–100.0)
Platelets: 184 10*3/uL (ref 150–400)
RBC: 2.87 MIL/uL — ABNORMAL LOW (ref 4.22–5.81)
RDW: 15.7 % — ABNORMAL HIGH (ref 11.5–15.5)
WBC: 8.7 10*3/uL (ref 4.0–10.5)
nRBC: 0 % (ref 0.0–0.2)

## 2021-06-27 LAB — BASIC METABOLIC PANEL
Anion gap: 7 (ref 5–15)
BUN: 26 mg/dL — ABNORMAL HIGH (ref 8–23)
CO2: 26 mmol/L (ref 22–32)
Calcium: 8.2 mg/dL — ABNORMAL LOW (ref 8.9–10.3)
Chloride: 105 mmol/L (ref 98–111)
Creatinine, Ser: 1.26 mg/dL — ABNORMAL HIGH (ref 0.61–1.24)
GFR, Estimated: 51 mL/min — ABNORMAL LOW (ref >60)
Glucose, Bld: 103 mg/dL — ABNORMAL HIGH (ref 70–99)
Potassium: 4.3 mmol/L (ref 3.5–5.1)
Sodium: 138 mmol/L (ref 135–145)

## 2021-06-27 LAB — MAGNESIUM: Magnesium: 2.2 mg/dL (ref 1.7–2.4)

## 2021-06-27 MED ORDER — INFLUENZA VAC A&B SA ADJ QUAD 0.5 ML IM PRSY
0.5000 mL | PREFILLED_SYRINGE | INTRAMUSCULAR | Status: AC
  Administered 2021-06-29: 0.5 mL via INTRAMUSCULAR
  Filled 2021-06-27: qty 0.5

## 2021-06-27 MED ORDER — DOCUSATE SODIUM 100 MG PO CAPS
100.0000 mg | ORAL_CAPSULE | Freq: Every day | ORAL | Status: DC | PRN
  Administered 2021-06-27: 100 mg via ORAL
  Filled 2021-06-27: qty 1

## 2021-06-27 NOTE — TOC Progression Note (Addendum)
Transition of Care [name]) - Progression Note    Patient Details  Name: Jordan [name] MRN: [id number] Date of Birth: 1917-12-18  [facility] CM/SW Contact  Leeroy Cha, RN Phone Number: 06/27/2021, 12:51 PM  Clinical Narrative:    Spoke at length with the son Louie Casa and the daughter in law, both are wanting to take the patient to their home instead of rehab,  options for home care given.  Will need  pt,ot and aide for help at home.  They are going to speak with the patient and let me know of the outcome. Tcf-[name] the son, will take patient home with him and his wife.  No equipment needed, will need pt ot and aide-md notified.  Address that patient will go to is; [address] [place] [address]  Expected Discharge Plan: Skilled Nursing Facility Barriers to Discharge: Continued Medical Work up  Expected Discharge Plan and Services Expected Discharge Plan: [place]   Discharge Planning Services: CM Consult   Living arrangements for the past 2 months: Single Family Home                                       Social Determinants of Health (SDOH) Interventions    Readmission Risk Interventions No flowsheet data found.

## 2021-06-27 NOTE — Progress Notes (Signed)
PROGRESS NOTE  Jordan [name]:[id number] DOB: 1917/10/22 DOA: 06/22/2021 PCP: Leanna Battles, MD   LOS: 5 days   Brief Narrative / Interim history: Jordan [name] is a 87 years old male with past medical history of uropathy, GERD, coronary artery disease,  abdominal aortic aneurysm, anemia of chronic disease, recurrent hematuria, history of radiation therapy, essential hypertension, hyperlipidemia, history of renal mass on the left, history of heart block with indwelling Foley catheter was brought into the hospital with urinary retention and was noted to have gross hematuria with blood clots.  Hemoglobin had dropped significantly to 6.2 from previous hemoglobin of 10.9.  Urology was consulted and patient was started on continuous bladder irrigation.  Initial labs showed anemia with elevated creatinine at 1.6.He was then admitted to the hospital for further evaluation and treatment.    Subjective / 24h Interval events: He is doing well this morning, denies any abdominal pain, no nausea or vomiting.  Assessment & Plan: Principal Problem Gross hematuria-With indwelling Foley catheter at baseline.  Has recurrent hematuria.  Urology has seen the patient and received continuous bladder irrigation during hospitalization.  Urine started to clear, last hemoglobin was 7.9.  He did receive a unit of packed red blood cells while hospitalized.  Urology recommends continuation of Foley catheter for at least a week and follow-up in clinic for possible cystoscopy.  He was restarted on his Plavix, continue to closely monitor  Active Problems Coronary artery disease-No chest pain.  No active issues at this time.  Hemoglobin of 7.8 this morning   Essential hypertension -Continue amlodipine.  Continue to hold diuretics.   Hyperlipidemia-Continue with statin   Acute blood loss anemia secondary to hematuria  -Monitor, hematuria cleared    Chronic kidney disease stage III-Creatinine overall  stable  Debility, deconditioning, patient was seen by physical therapy who recommended skilled nursing facility placement.  Patient was unsafe for discharge.  Placement pending  Scheduled Meds:  amLODipine  5 mg Oral q AM   atorvastatin  10 mg Oral QPM   Chlorhexidine Gluconate Cloth  6 each Topical Daily   clopidogrel  75 mg Oral Daily   finasteride  5 mg Oral QPM   ketoconazole   Topical BID   pantoprazole  40 mg Oral Daily   polyethylene glycol  8.5 g Oral Daily   Continuous Infusions:  sodium chloride irrigation Stopped (06/26/21 0950)   PRN Meds:.acetaminophen, ondansetron **OR** ondansetron (ZOFRAN) IV  Diet Orders (From admission, onward)     Start     Ordered   06/23/21 1652  Diet Heart Room service appropriate? Yes; Fluid consistency: Thin  Diet effective now       Question Answer Comment  Room service appropriate? Yes   Fluid consistency: Thin      06/23/21 1651            DVT prophylaxis: SCDs Start: 06/22/21 2358     Code Status: Full Code  Family Communication: No family at bedside  Status is: Inpatient  Remains inpatient appropriate because:Unsafe d/c plan  Dispo: The patient is from: Home              Anticipated d/c is to: SNF              Patient currently is medically stable to d/c.   Difficult to place patient No   Level of care: Progressive  Consultants:  Urology   Procedures:  none  Microbiology  none  Antimicrobials: none    Objective:  Vitals:   06/25/21 2116 06/26/21 0446 06/26/21 2052 06/27/21 0450  BP: (!) 146/64 134/78 140/65 131/61  Pulse: 71 79 81 79  Resp: 18 18 20 18   Temp: 98.2 F (36.8 C) 98.3 F (36.8 C) 98.4 F (36.9 C) 98.5 F (36.9 C)  TempSrc: Oral Oral Oral Oral  SpO2: 98% 96% 93% 93%  Weight:      Height:        Intake/Output Summary (Last 24 hours) at 06/27/2021 1139 Last data filed at 06/26/2021 2000 Gross per 24 hour  Intake --  Output 550 ml  Net -550 ml   Filed Weights   06/23/21  0432  Weight: 68.9 kg    Examination:  Constitutional: NAD Eyes: no scleral icterus ENMT: Mucous membranes are moist.  Neck: normal, supple Respiratory: clear to auscultation bilaterally, no wheezing, no crackles. Normal respiratory effort. No accessory muscle use.  Cardiovascular: Regular rate and rhythm, no murmurs / rubs / gallops.  Abdomen: non distended, no tenderness. Bowel sounds positive.  Musculoskeletal: no clubbing / cyanosis.  Skin: no rashes Neurologic: non focal   Data Reviewed: I have independently reviewed following labs and imaging studies  CBC: Recent Labs  Lab 06/22/21 2123 06/23/21 0545 06/23/21 2042 06/24/21 0535 06/25/21 0959 06/26/21 0338 06/27/21 0322  WBC 8.3 5.7  --  8.7 9.0 9.0 8.7  NEUTROABS 5.7  --   --  4.5  --   --   --   HGB 6.2* 6.0* 8.0* 8.5* 8.5* 7.9* 7.8*  HCT 20.5* 19.2* 25.0* 26.7* 26.5* 25.6* 25.1*  MCV 84.7 83.8  --  84.5 85.5 88.3 87.5  PLT 218 177  --  191 177 180 154   Basic Metabolic Panel: Recent Labs  Lab 06/22/21 2123 06/23/21 0545 06/24/21 0535 06/26/21 0338 06/27/21 0322  NA 136 141 138 135 138  K 4.0 3.7 4.1 4.0 4.3  CL 101 106 104 103 105  CO2 28 29 27 26 26   GLUCOSE 116* 112* 97 139* 103*  BUN 34* 32* 28* 22 26*  CREATININE 1.66* 1.65* 1.26* 1.26* 1.26*  CALCIUM 8.4* 8.4* 8.5* 8.1* 8.2*  MG  --   --   --   --  2.2   Liver Function Tests: Recent Labs  Lab 06/23/21 0545  AST 17  ALT 10  ALKPHOS 33*  BILITOT 0.4  PROT 5.6*  ALBUMIN 2.5*   Coagulation Profile: Recent Labs  Lab 06/22/21 2258  INR 1.1   HbA1C: No results for input(s): HGBA1C in the last 72 hours. CBG: No results for input(s): GLUCAP in the last 168 hours.  Recent Results (from the past 240 hour(s))  Resp Panel by RT-PCR (Flu A&B, Covid) Nasopharyngeal Swab     Status: None   Collection Time: 06/22/21 10:58 PM   Specimen: Nasopharyngeal Swab; Nasopharyngeal(NP) swabs in vial transport medium  Result Value Ref Range Status    SARS Coronavirus 2 by RT PCR NEGATIVE NEGATIVE Final    Comment: (NOTE) SARS-CoV-2 target nucleic acids are NOT DETECTED.  The SARS-CoV-2 RNA is generally detectable in upper respiratory specimens during the acute phase of infection. The lowest concentration of SARS-CoV-2 viral copies this assay can detect is 138 copies/mL. A negative result does not preclude SARS-Cov-2 infection and should not be used as the sole basis for treatment or other patient management decisions. A negative result may occur with  improper specimen collection/handling, submission of specimen other than nasopharyngeal swab, presence of viral mutation(s) within the areas targeted by this  assay, and inadequate number of viral copies(<138 copies/mL). A negative result must be combined with clinical observations, patient history, and epidemiological information. The expected result is Negative.  Fact Sheet for Patients:  EntrepreneurPulse.com.au  Fact Sheet for Healthcare Providers:  IncredibleEmployment.be  This test is no t yet approved or cleared by the [place] FDA and  has been authorized for detection and/or diagnosis of SARS-CoV-2 by FDA under an Emergency Use Authorization (EUA). This EUA will remain  in effect (meaning this test can be used) for the duration of the COVID-19 declaration under Section 564(b)(1) of the Act, 21 U.S.C.section 360bbb-3(b)(1), unless the authorization is terminated  or revoked sooner.       Influenza A by PCR NEGATIVE NEGATIVE Final   Influenza B by PCR NEGATIVE NEGATIVE Final    Comment: (NOTE) The Xpert Xpress SARS-CoV-2/FLU/RSV plus assay is intended as an aid in the diagnosis of influenza from Nasopharyngeal swab specimens and should not be used as a sole basis for treatment. Nasal washings and aspirates are unacceptable for Xpert Xpress SARS-CoV-2/FLU/RSV testing.  Fact Sheet for  Patients: EntrepreneurPulse.com.au  Fact Sheet for Healthcare Providers: IncredibleEmployment.be  This test is not yet approved or cleared by the [place] FDA and has been authorized for detection and/or diagnosis of SARS-CoV-2 by FDA under an Emergency Use Authorization (EUA). This EUA will remain in effect (meaning this test can be used) for the duration of the COVID-19 declaration under Section 564(b)(1) of the Act, 21 U.S.C. section 360bbb-3(b)(1), unless the authorization is terminated or revoked.  Performed at [facility], [place] [address] [place],  [address]      Radiology Studies: No results found.   Marzetta Board, MD, PhD [facility]  Between 7 am - 7 pm I am available, please contact me via Amion (for emergencies) or Securechat (non urgent messages)  Between 7 pm - 7 am I am not available, please contact night coverage MD/APP via Amion

## 2021-06-27 NOTE — TOC Transition Note (Signed)
Transition of Care [facility]) - CM/SW Discharge Note   Patient Details  Name: Jordan [name] MRN: [id number] Date of Birth: January 10, 1918  Transition of Care [facility]) CM/SW Contact:  Leeroy Cha, RN Phone Number: 06/27/2021, 2:29 PM   Clinical Narrative:    Hhc care will be done by [facility] address for service is on previous entry   Final next level of care: [place] Barriers to Discharge: Barriers Resolved   Patient Goals and CMS Choice Patient states their goals for this hospitalization and ongoing recovery are:: i want to go home but i will go to rehab CMS Medicare.gov Compare Post Acute Care list provided to:: Patient Choice offered to / list presented to : Adult Children  Discharge Placement                       Discharge Plan and Services   Discharge Planning Services: CM Consult                      HH Arranged: OT, PT, Nurse's Aide HH Agency: [place] Date [place]: 06/27/21 Time [place]: [place] Representative spoke with at [place]: stacie  Social Determinants of Health ([place]) Interventions     Readmission Risk Interventions No flowsheet data found.

## 2021-06-27 NOTE — Progress Notes (Signed)
Pt was functioning modified independently in self care (standing to sponge bathe at sink) prior to admission and ambulating with a cane. He lived alone with intermittent assist for IADL of his son and daughter in law. Pt presents with generalized weakness, particularly in knees. Daughter in law reports his gets cortisone injection every 4 months, but has not been able to recently due to illness. He is heavily reliant on UEs to stand. Family plans to take pt home to their house upon discharge. Recommending HHOT and an aide.   06/27/21 1600  OT Visit Information  Last OT Received On 06/27/21  Assistance Needed +1  History of Present Illness Patient is 87 y.o. male with chronic indwelling catheter who returned to [facility] for c/o urinary retention gross hematuria, and drop in hemoglobin. Patient admitted and initiated CBI for management of hematuria. PMH significant for BPH, HTN, CAD, OA, HLD, pacemaker.  Precautions  Precautions Fall  Home Living  Family/patient expects to be discharged to: Private residence  Living Arrangements Alone  Available Help at Discharge Family;Available 24 hours/day  Type of Home House  Home Access Stairs to enter  Entrance Stairs-Number of Steps 5  Entrance Stairs-Rails Right  Home Layout One level  Bathroom Shower/Tub Walk-in shower  [place] - 2 wheels;Walker - 4 wheels;Cane - single point  Additional Comments has access to [facility], plans to go home with son and daughter in law  Prior Function  Level of Independence Independent with assistive device(s)  Comments pt reports typically independent with a SPC for mobility and that he performs sink baths and hasn't been in his shower in ~6 months or more, assisted for IADL, likes to watch ball games  Communication  Communication HOH  Pain Assessment  Pain Assessment Faces  Faces Pain Scale 0  Cognition  Arousal/Alertness Awake/alert  Behavior During Therapy WFL for tasks  assessed/performed  Overall Cognitive Status Within Functional Limits for tasks assessed  Upper Extremity Assessment  Upper Extremity Assessment Overall WFL for tasks assessed;RUE deficits/detail  RUE Deficits / Details mild edema in hand  Lower Extremity Assessment  Lower Extremity Assessment Defer to PT evaluation  Cervical / Trunk Assessment  Cervical / Trunk Assessment Kyphotic  ADL  Overall ADL's  Needs assistance/impaired  Eating/Feeding Independent;Sitting  Grooming Set up;Sitting  Upper Body Bathing Minimal assistance;Sitting  Lower Body Bathing Maximal assistance;Sit to/from stand  Upper Body Dressing  Minimal assistance;Sitting  Lower Body Dressing Maximal assistance;Sit to/from stand  Functional mobility during ADLs Minimal assistance;Rolling walker  Vision- History  Baseline Vision/History 4 Cataracts  Patient Visual Report No change from baseline  Vision- Assessment  Additional Comments uses readers  Bed Mobility  Overal bed mobility Needs Assistance  Bed Mobility Supine to Sit;Sit to Supine  Supine to sit Min assist;HOB elevated  Sit to supine Min guard  General bed mobility comments VCs to use bedrail with min A to upright trunk, increased time; min guard to return tos upine wiht increased time, scoots up in bed with use of bedrails and increased time  Transfers  Overall transfer level Needs assistance  Equipment used Rolling walker (2 wheeled)  Transfers Sit to/from Stand  Sit to Stand Mod assist  Balance  Overall balance assessment Needs assistance  Sitting balance-Leahy Scale Fair  Standing balance support During functional activity;Bilateral upper extremity supported  Standing balance-Leahy Scale Poor  Standing balance comment reliant on UE support  OT - End of Session  Activity Tolerance Patient tolerated treatment  well  Patient left in bed;with call bell/phone within reach;with bed alarm set;with family/visitor present  OT Assessment  OT  Recommendation/Assessment Patient needs continued OT Services  OT Visit Diagnosis Unsteadiness on feet (R26.81);Other abnormalities of gait and mobility (R26.89);Muscle weakness (generalized) (M62.81)  OT Problem List Decreased strength;Decreased activity tolerance;Impaired balance (sitting and/or standing);Decreased knowledge of use of DME or AE  OT Plan  OT Frequency (ACUTE ONLY) Min 2X/week  OT Treatment/Interventions (ACUTE ONLY) Self-care/ADL training;DME and/or AE instruction;Therapeutic activities;Patient/family education;Balance training  AM-PAC OT "6 Clicks" Daily Activity Outcome Measure (Version 2)  Help from another person eating meals? 4  Help from another person taking care of personal grooming? 3  Help from another person toileting, which includes using toliet, bedpan, or urinal? 2  Help from another person bathing (including washing, rinsing, drying)? 2  Help from another person to put on and taking off regular upper body clothing? 3  Help from another person to put on and taking off regular lower body clothing? 2  6 Click Score 16  Progressive Mobility  What is the highest level of mobility based on the progressive mobility assessment? Level 2 ([place]) - Balance while sitting on edge of bed and cannot stand  Mobility Out of bed to chair with meals  OT Recommendation  Follow Up Recommendations Home health OT;Supervision/Assistance - 24 hour (Home health aide)  Individuals Consulted  Consulted and Agree with Results and Recommendations Patient  Acute Rehab OT Goals  Patient Stated Goal family plans to take him to their home  OT Goal Formulation With patient/family  Time For Goal Achievement 07/11/21  Potential to Achieve Goals Good  OT Time Calculation  OT Start Time (ACUTE ONLY) 1530  OT Stop Time (ACUTE ONLY) 1600  OT Time Calculation (min) 30 min  OT General Charges  $OT Visit 1 Visit  OT Evaluation  $OT Eval Moderate Complexity 1 Mod  OT Treatments  $Self  Care/Home Management  8-22 mins  Written Expression  Dominant Hand Right  Nestor Lewandowsky, OTR/L Acute Rehabilitation Services Pager: [phone] Office: [phone]

## 2021-06-27 NOTE — Progress Notes (Signed)
Physical Therapy Treatment Patient Details Name: Jordan [name] MRN: [id number] DOB: August 15, 1918 Today's Date: 06/27/2021   History of Present Illness Patient is 87 y.o. male with chronic indwelling catheter who returned to [facility] for c/o urinary retention gross hematuria, and drop in hemoglobin. Patient admitted and initiated CBI for management of hematuria. PMH significant for BPH, HTN, CAD, OA, HLD, pacemaker.    PT Comments    Pt with improved ambulation tolerance, with initial rise to standing pt unsteady and with significant bil knee pain requiring return to sitting. Pt ambulates in room with RW, VC to maintain body position closer to RW and more upright posture. Pt tolerates BLE strengthening exercises without pain complaints. Educated pt and family on time OOB, ambulating to restroom and in hallway with nursing as able and both in agreement. Will continue to progress as able.   Recommendations for follow up therapy are one component of a multi-disciplinary discharge planning process, led by the attending physician.  Recommendations may be updated based on patient status, additional functional criteria and insurance authorization.  Follow Up Recommendations  SNF;Supervision for mobility/OOB     Equipment Recommendations  Rolling walker with 5" wheels    Recommendations for Other Services OT consult     Precautions / Restrictions Precautions Precautions: Fall Restrictions Weight Bearing Restrictions: No     Mobility  Bed Mobility Overal bed mobility: Needs Assistance Bed Mobility: Supine to Sit;Sit to Supine  Supine to sit: Min assist;HOB elevated Sit to supine: Min guard   General bed mobility comments: VCs to use bedrail with min A to upright trunk, increased time; min guard to return tos upine wiht increased time, scoots up in bed with use of bedrails and increased time    Transfers Overall transfer level: Needs assistance Equipment used: Rolling walker (2  wheeled) Transfers: Sit to/from Stand Sit to Stand: Mod assist;Min assist  General transfer comment: mod A from bed wtih RW and min A from recliner with use of armrests to power up, VCs for quad/glute activation and have placement  Ambulation/Gait Ambulation/Gait assistance: Min assist Gait Distance (Feet): 18 Feet Assistive device: Rolling walker (2 wheeled) Gait Pattern/deviations: Step-to pattern;Decreased stride length;Trunk flexed Gait velocity: decreased   General Gait Details: step through pattern, VCs to maintain body at closer position to RW, maintains trunk in flexed posture with bil hips/knees flexed, no knee buckling or gross LOB   Stairs             Wheelchair Mobility    Modified Rankin (Stroke Patients Only)       Balance Overall balance assessment: Needs assistance  Standing balance support: During functional activity;Bilateral upper extremity supported Standing balance-Leahy Scale: Poor Standing balance comment: reliant on UE support       Cognition Arousal/Alertness: Awake/alert Behavior During Therapy: WFL for tasks assessed/performed Overall Cognitive Status: Within Functional Limits for tasks assessed         Exercises General Exercises - Lower Extremity Ankle Circles/Pumps: Seated;AROM;Both;15 reps Long Arc Quad: Seated;AROM;Strengthening;5 reps;15 reps Hip ABduction/ADduction: Seated;AROM;Strengthening;Both;15 reps Hip Flexion/Marching: Seated;AROM;Strengthening;Both;15 reps Other Exercises Other Exercises: STS, 3 reps then 2 reps from recliner using armrests, min A    General Comments        Pertinent Vitals/Pain Pain Assessment: 0-10 Pain Score: 4  Pain Location: bil knees Pain Descriptors / Indicators: Aching;Discomfort Pain Intervention(s): Limited activity within patient's tolerance;Monitored during session    Home Living  Prior Function            PT Goals (current goals can now be found  in the care plan section) Acute Rehab PT Goals Patient Stated Goal: go home PT Goal Formulation: With patient/family Time For Goal Achievement: 07/10/21 Potential to Achieve Goals: Fair Progress towards PT goals: Progressing toward goals    Frequency    7X/week      PT Plan Current plan remains appropriate    Co-evaluation              AM-PAC PT "6 Clicks" Mobility   Outcome Measure  Help needed turning from your back to your side while in a flat bed without using bedrails?: A Little Help needed moving from lying on your back to sitting on the side of a flat bed without using bedrails?: A Little Help needed moving to and from a bed to a chair (including a wheelchair)?: A Lot Help needed standing up from a chair using your arms (e.g., wheelchair or bedside chair)?: A Little Help needed to walk in hospital room?: A Lot Help needed climbing 3-5 steps with a railing? : Total 6 Click Score: 14    End of Session Equipment Utilized During Treatment: Gait belt Activity Tolerance: Patient tolerated treatment well Patient left: in bed;with call bell/phone within reach;with bed alarm set Nurse Communication: Mobility status PT Visit Diagnosis: Muscle weakness (generalized) (M62.81);Difficulty in walking, not elsewhere classified (R26.2);Unsteadiness on feet (R26.81)     Time: 4010-2725 PT Time Calculation (min) (ACUTE ONLY): 40 min  Charges:  $Gait Training: 8-22 mins $Therapeutic Exercise: 8-22 mins $Therapeutic Activity: 8-22 mins                      Tori Toryn Dewalt PT, DPT 06/27/21, 1:33 PM

## 2021-06-28 DIAGNOSIS — L8990 Pressure ulcer of unspecified site, unspecified stage: Secondary | ICD-10-CM | POA: Insufficient documentation

## 2021-06-28 LAB — CBC
HCT: 25 % — ABNORMAL LOW (ref 39.0–52.0)
Hemoglobin: 7.7 g/dL — ABNORMAL LOW (ref 13.0–17.0)
MCH: 26.8 pg (ref 26.0–34.0)
MCHC: 30.8 g/dL (ref 30.0–36.0)
MCV: 87.1 fL (ref 80.0–100.0)
Platelets: 178 10*3/uL (ref 150–400)
RBC: 2.87 MIL/uL — ABNORMAL LOW (ref 4.22–5.81)
RDW: 15.8 % — ABNORMAL HIGH (ref 11.5–15.5)
WBC: 8 10*3/uL (ref 4.0–10.5)
nRBC: 0 % (ref 0.0–0.2)

## 2021-06-28 LAB — PREPARE RBC (CROSSMATCH)

## 2021-06-28 MED ORDER — SODIUM CHLORIDE 0.9% IV SOLUTION: Freq: Once | INTRAVENOUS | Status: AC

## 2021-06-28 NOTE — Care Management Important Message (Signed)
Important Message  Patient Details IM Letter placed in the Patients room Name: Jordan [name] MRN: [id number] Date of Birth: Mar 17, 1918   Medicare Important Message Given:  Yes     [name] 06/28/2021, 12:59 PM

## 2021-06-28 NOTE — Progress Notes (Signed)
Physical Therapy Treatment Patient Details Name: Jordan [name] MRN: [id number] DOB: 1918/08/09 Today's Date: 06/28/2021   History of Present Illness Patient is 87 y.o. male with chronic indwelling catheter who returned to [facility] for c/o urinary retention gross hematuria, and drop in hemoglobin. Patient admitted and initiated CBI for management of hematuria. PMH significant for BPH, HTN, CAD, OA, HLD, pacemaker.    PT Comments    Patient progressing well with mobility and demonstrated improved use of UE's for power up with min guard for safety to stand from recliner. Min guard needed for gait with RW. No LOB noted however pt does have extremely flexed trunk/posture throughout and requires cues to improve walker position with gait. Educated pt/family on stair mobility as pt now returning home with assist from son and daughter-in-law. Acute PT will continue to progress pt as able.    Recommendations for follow up therapy are one component of a multi-disciplinary discharge planning process, led by the attending physician.  Recommendations may be updated based on patient status, additional functional criteria and insurance authorization.  Follow Up Recommendations  Home health PT;Supervision/Assistance - 24 hour     Equipment Recommendations  None recommended by PT    Recommendations for Other Services       Precautions / Restrictions Precautions Precautions: Fall Restrictions Weight Bearing Restrictions: No     Mobility  Bed Mobility               General bed mobility comments: pt OOB in recliner    Transfers Overall transfer level: Needs assistance Equipment used: Rolling walker (2 wheeled) Transfers: Sit to/from Stand Sit to Stand: Min guard         General transfer comment: clsoe guard for safety, pt using bil UE's to power up from recliner. no assist needed. pt required extra time and effort. pt with no reach back to sit and plopped into chair at  EOS.  Ambulation/Gait Ambulation/Gait assistance: Min guard Gait Distance (Feet): 125 Feet Assistive device: Rolling walker (2 wheeled) Gait Pattern/deviations: Step-to pattern;Decreased stride length;Trunk flexed Gait velocity: decr   General Gait Details: close guarding throughout with cues to improve proximity to RW. pt with tendency to extend walker away from BOS to reduce fatigue on UE's as he ambulates in flexed/crouched posture. no buckling at knees or LOB noted. cues needed to improve posture but pt with significant kyphosis. cues also for small turns with walker to improve safety.   Stairs             Wheelchair Mobility    Modified Rankin (Stroke Patients Only)       Balance Overall balance assessment: Needs assistance   Sitting balance-Leahy Scale: Fair     Standing balance support: During functional activity;Bilateral upper extremity supported Standing balance-Leahy Scale: Poor Standing balance comment: reliant on UE support                            Cognition Arousal/Alertness: Awake/alert Behavior During Therapy: WFL for tasks assessed/performed Overall Cognitive Status: Within Functional Limits for tasks assessed                                        Exercises      General Comments        Pertinent Vitals/Pain Pain Assessment: Faces Faces Pain Scale: No hurt Pain Intervention(s): Monitored during  session    Home Living                      Prior Function            PT Goals (current goals can now be found in the care plan section) Acute Rehab PT Goals Patient Stated Goal: family plans to take him to their home PT Goal Formulation: With patient/family Time For Goal Achievement: 07/10/21 Potential to Achieve Goals: Fair Progress towards PT goals: Progressing toward goals    Frequency    Min 3X/week      PT Plan Current plan remains appropriate    Co-evaluation               AM-PAC PT "6 Clicks" Mobility   Outcome Measure  Help needed turning from your back to your side while in a flat bed without using bedrails?: A Little Help needed moving from lying on your back to sitting on the side of a flat bed without using bedrails?: A Little Help needed moving to and from a bed to a chair (including a wheelchair)?: A Little Help needed standing up from a chair using your arms (e.g., wheelchair or bedside chair)?: A Little Help needed to walk in hospital room?: A Little Help needed climbing 3-5 steps with a railing? : A Lot 6 Click Score: 17    End of Session Equipment Utilized During Treatment: Gait belt Activity Tolerance: Patient tolerated treatment well Patient left: with call bell/phone within reach;in chair;with family/visitor present Nurse Communication: Mobility status PT Visit Diagnosis: Muscle weakness (generalized) (M62.81);Difficulty in walking, not elsewhere classified (R26.2);Unsteadiness on feet (R26.81)     Time: 9458-5929 PT Time Calculation (min) (ACUTE ONLY): 29 min  Charges:  $Gait Training: 23-37 mins                     Jordan [name], DPT Acute Rehabilitation Services Office [phone] Pager [phone]    Jordan [name] 06/28/2021, 11:35 AM

## 2021-06-28 NOTE — Progress Notes (Signed)
0845- Pt lying in bed watching tv. Pt is alert and oriented x4. Plan of care and new orders reviewed with pt. Verbalized understanding. No complaints voiced. Call bell within reach.  1445- Pt resting quietly in bed watching tv. Vital signs stable. Will start PRBCs soon.   1500- PRBCs started at this time. See Pt's chart for unit number, etc.  1595- PRBCs increased to 130ml/hr. Pt tolerating well. VS stable.  1750- PRBC xfusion complete. Vital signs stable. No s/sx of reaction. Will continue to monitor.

## 2021-06-28 NOTE — Progress Notes (Signed)
Occupational Therapy Treatment Patient Details Name: Jordan [name] MRN: [id number] DOB: 04/06/1918 Today's Date: 06/28/2021   History of present illness Patient is 87 y.o. male with chronic indwelling catheter who returned to [facility] for c/o urinary retention gross hematuria, and drop in hemoglobin. Patient admitted and initiated CBI for management of hematuria. PMH significant for BPH, HTN, CAD, OA, HLD, pacemaker.   OT comments  Treatment limited today secondary to patient not being agreeable to ADL task or further mobility. Patient perturbed by not having the breakfast he ordered at 0630. Patient supervision to transfer to side of bed and min guard for ambulation with RW to recliner. No overt loss of balance with standing. Will continue to follow patient acutely.    Recommendations for follow up therapy are one component of a multi-disciplinary discharge planning process, led by the attending physician.  Recommendations may be updated based on patient status, additional functional criteria and insurance authorization.    Follow Up Recommendations  Home health OT;Supervision/Assistance - 24 hour    Equipment Recommendations  None recommended by OT    Recommendations for Other Services      Precautions / Restrictions Precautions Precautions: Fall Restrictions Weight Bearing Restrictions: No       Mobility Bed Mobility Overal bed mobility: Needs Assistance Bed Mobility: Supine to Sit     Supine to sit: Supervision;HOB elevated     General bed mobility comments: supervision for transfer to side of bed with increased time.    Transfers Overall transfer level: Needs assistance Equipment used: Rolling walker (2 wheeled) Transfers: Sit to/from [facility] Sit to Stand: Min guard Stand pivot transfers: Min guard       General transfer comment: min guard with RW to transfer to recilner. Patient not agreeable to ADLs or increased mobility this morning.     Balance Overall balance assessment: Needs assistance Sitting-balance support: No upper extremity supported Sitting balance-Leahy Scale: Fair     Standing balance support: During functional activity;Bilateral upper extremity supported Standing balance-Leahy Scale: Poor Standing balance comment: reliant on UE support                           ADL either performed or assessed with clinical judgement   ADL                                               Vision Baseline Vision/History: 4 Cataracts Patient Visual Report: No change from baseline     Perception     Praxis      Cognition Arousal/Alertness: Awake/alert Behavior During Therapy: WFL for tasks assessed/performed Overall Cognitive Status: Within Functional Limits for tasks assessed                                          Exercises     Shoulder Instructions       General Comments      Pertinent Vitals/ Pain       Pain Assessment: No/denies pain Faces Pain Scale: No hurt Pain Intervention(s): Monitored during session  Home Living  Prior Functioning/Environment              Frequency  Min 2X/week        Progress Toward Goals  OT Goals(current goals can now be found in the care plan section)  Progress towards OT goals: Progressing toward goals  Acute Rehab OT Goals Patient Stated Goal: family plans to take him to their home OT Goal Formulation: With patient/family Time For Goal Achievement: 07/11/21 Potential to Achieve Goals: Good  Plan Discharge plan remains appropriate    Co-evaluation                 AM-PAC OT "6 Clicks" Daily Activity     Outcome Measure   Help from another person eating meals?: None Help from another person taking care of personal grooming?: A Little Help from another person toileting, which includes using toliet, bedpan, or urinal?: A Lot Help from  another person bathing (including washing, rinsing, drying)?: A Lot Help from another person to put on and taking off regular upper body clothing?: A Little Help from another person to put on and taking off regular lower body clothing?: A Lot 6 Click Score: 16    End of Session Equipment Utilized During Treatment: Rolling walker;Gait belt  OT Visit Diagnosis: Unsteadiness on feet (R26.81);Other abnormalities of gait and mobility (R26.89);Muscle weakness (generalized) (M62.81)   Activity Tolerance Patient tolerated treatment well   Patient Left in bed;with call bell/phone within reach;with bed alarm set;with family/visitor present   Nurse Communication Mobility status        Time: 4383-8184 OT Time Calculation (min): 10 min  Charges: OT General Charges $OT Visit: 1 Visit OT Treatments $Therapeutic Activity: 8-22 mins  Derl Barrow, OTR/L [place]  Office [phone] Pager: [place] 06/28/2021, 12:25 PM

## 2021-06-28 NOTE — Progress Notes (Signed)
PROGRESS NOTE  Jordan [name] KXF:[id number] DOB: August 20, 1918 DOA: 06/22/2021 PCP: Leanna Battles, MD   LOS: 6 days   Brief Narrative / Interim history: Jordan [name] is a 87 years old male with past medical history of uropathy, GERD, coronary artery disease,  abdominal aortic aneurysm, anemia of chronic disease, recurrent hematuria, history of radiation therapy, essential hypertension, hyperlipidemia, history of renal mass on the left, history of heart block with indwelling Foley catheter was brought into the hospital with urinary retention and was noted to have gross hematuria with blood clots.  Hemoglobin had dropped significantly to 6.2 from previous hemoglobin of 10.9.  Urology was consulted and patient was started on continuous bladder irrigation.  Initial labs showed anemia with elevated creatinine at 1.6.He was then admitted to the hospital for further evaluation and treatment.    Subjective / 24h Interval events: No abdominal pain, no nausea or vomiting.  Assessment & Plan: Principal Problem Gross hematuria-With indwelling Foley catheter at baseline.  Has recurrent hematuria.  Urology has seen the patient and received continuous bladder irrigation during hospitalization.  His hematuria has cleared, he was restarted on Plavix 10/12 and urine remains clear -Urology recommends continuation of Foley catheter for at least a week and follow-up in clinic for possible cystoscopy.  He was restarted on his Plavix, continue to closely monitor  Active Problems Coronary artery disease-No chest pain.  No active issues at this time.  Hemoglobin 7.7 this morning, transfuse as below   Essential hypertension -Continue amlodipine.  Continue to hold diuretics.   Hyperlipidemia-Continue with statin   Acute blood loss anemia secondary to hematuria  -monitor, hematuria cleared.  Due to his underlying CAD will be transfused an additional unit of packed red blood cells to keep hemoglobin above 8.   Hemoglobin this morning was 7.7  Chronic kidney disease stage III-Creatinine overall stable  Debility, deconditioning, patient was seen by physical therapy who recommended skilled nursing facility placement.  Family wishes for the patient to return home with family  Scheduled Meds:  sodium chloride   Intravenous Once   amLODipine  5 mg Oral q AM   atorvastatin  10 mg Oral QPM   Chlorhexidine Gluconate Cloth  6 each Topical Daily   clopidogrel  75 mg Oral Daily   finasteride  5 mg Oral QPM   influenza vaccine adjuvanted  0.5 mL Intramuscular Tomorrow-1000   ketoconazole   Topical BID   pantoprazole  40 mg Oral Daily   polyethylene glycol  8.5 g Oral Daily   Continuous Infusions:  sodium chloride irrigation Stopped (06/26/21 0950)   PRN Meds:.acetaminophen, docusate sodium, ondansetron **OR** ondansetron (ZOFRAN) IV  Diet Orders (From admission, onward)     Start     Ordered   06/23/21 1652  Diet Heart Room service appropriate? Yes; Fluid consistency: Thin  Diet effective now       Question Answer Comment  Room service appropriate? Yes   Fluid consistency: Thin      06/23/21 1651            DVT prophylaxis: SCDs Start: 06/22/21 2358     Code Status: Full Code  Family Communication: No family at bedside  Status is: Inpatient  Remains inpatient appropriate because:Unsafe d/c plan  Dispo: The patient is from: Home              Anticipated d/c is to: SNF              Patient currently is medically stable  to d/c.   Difficult to place patient No   Level of care: Progressive  Consultants:  Urology   Procedures:  none  Microbiology  none  Antimicrobials: none    Objective: Vitals:   06/27/21 0450 06/27/21 1449 06/27/21 1900 06/28/21 0548  BP: 131/61 119/62 124/66 118/62  Pulse: 79 77 78 79  Resp: 18 16 18 17   Temp: 98.5 F (36.9 C) 98.2 F (36.8 C) 98.7 F (37.1 C) 98.1 F (36.7 C)  TempSrc: Oral Oral Oral Oral  SpO2: 93% 92% 95% 93%  Weight:       Height:        Intake/Output Summary (Last 24 hours) at 06/28/2021 1300 Last data filed at 06/28/2021 0600 Gross per 24 hour  Intake 360 ml  Output 700 ml  Net -340 ml    Filed Weights   06/23/21 0432  Weight: 68.9 kg    Examination:  Constitutional: No distress Eyes: Anicteric ENMT: mmm  Neck: normal, supple Respiratory: Clear bilaterally, no wheezing, no crackles, normal respiratory effort Cardiovascular: Regular rate and rhythm, no murmurs, no peripheral edema Abdomen: Soft, NT, ND, positive bowel sounds Musculoskeletal: no clubbing / cyanosis.  Skin: No rashes seen Neurologic: No focal deficits  Data Reviewed: I have independently reviewed following labs and imaging studies  CBC: Recent Labs  Lab 06/22/21 2123 06/23/21 0545 06/24/21 0535 06/25/21 0959 06/26/21 0338 06/27/21 0322 06/28/21 0757  WBC 8.3   < > 8.7 9.0 9.0 8.7 8.0  NEUTROABS 5.7  --  4.5  --   --   --   --   HGB 6.2*   < > 8.5* 8.5* 7.9* 7.8* 7.7*  HCT 20.5*   < > 26.7* 26.5* 25.6* 25.1* 25.0*  MCV 84.7   < > 84.5 85.5 88.3 87.5 87.1  PLT 218   < > 191 177 180 184 178   < > = values in this interval not displayed.    Basic Metabolic Panel: Recent Labs  Lab 06/22/21 2123 06/23/21 0545 06/24/21 0535 06/26/21 0338 06/27/21 0322  NA 136 141 138 135 138  K 4.0 3.7 4.1 4.0 4.3  CL 101 106 104 103 105  CO2 28 29 27 26 26   GLUCOSE 116* 112* 97 139* 103*  BUN 34* 32* 28* 22 26*  CREATININE 1.66* 1.65* 1.26* 1.26* 1.26*  CALCIUM 8.4* 8.4* 8.5* 8.1* 8.2*  MG  --   --   --   --  2.2    Liver Function Tests: Recent Labs  Lab 06/23/21 0545  AST 17  ALT 10  ALKPHOS 33*  BILITOT 0.4  PROT 5.6*  ALBUMIN 2.5*    Coagulation Profile: Recent Labs  Lab 06/22/21 2258  INR 1.1    HbA1C: No results for input(s): HGBA1C in the last 72 hours. CBG: No results for input(s): GLUCAP in the last 168 hours.  Recent Results (from the past 240 hour(s))  Resp Panel by RT-PCR (Flu A&B,  Covid) Nasopharyngeal Swab     Status: None   Collection Time: 06/22/21 10:58 PM   Specimen: Nasopharyngeal Swab; Nasopharyngeal(NP) swabs in vial transport medium  Result Value Ref Range Status   SARS Coronavirus 2 by RT PCR NEGATIVE NEGATIVE Final    Comment: (NOTE) SARS-CoV-2 target nucleic acids are NOT DETECTED.  The SARS-CoV-2 RNA is generally detectable in upper respiratory specimens during the acute phase of infection. The lowest concentration of SARS-CoV-2 viral copies this assay can detect is 138 copies/mL. A negative result does not  preclude SARS-Cov-2 infection and should not be used as the sole basis for treatment or other patient management decisions. A negative result may occur with  improper specimen collection/handling, submission of specimen other than nasopharyngeal swab, presence of viral mutation(s) within the areas targeted by this assay, and inadequate number of viral copies(<138 copies/mL). A negative result must be combined with clinical observations, patient history, and epidemiological information. The expected result is Negative.  Fact Sheet for Patients:  EntrepreneurPulse.com.au  Fact Sheet for Healthcare Providers:  IncredibleEmployment.be  This test is no t yet approved or cleared by the [place] FDA and  has been authorized for detection and/or diagnosis of SARS-CoV-2 by FDA under an Emergency Use Authorization (EUA). This EUA will remain  in effect (meaning this test can be used) for the duration of the COVID-19 declaration under Section 564(b)(1) of the Act, 21 U.S.C.section 360bbb-3(b)(1), unless the authorization is terminated  or revoked sooner.       Influenza A by PCR NEGATIVE NEGATIVE Final   Influenza B by PCR NEGATIVE NEGATIVE Final    Comment: (NOTE) The Xpert Xpress SARS-CoV-2/FLU/RSV plus assay is intended as an aid in the diagnosis of influenza from Nasopharyngeal swab specimens and should  not be used as a sole basis for treatment. Nasal washings and aspirates are unacceptable for Xpert Xpress SARS-CoV-2/FLU/RSV testing.  Fact Sheet for Patients: EntrepreneurPulse.com.au  Fact Sheet for Healthcare Providers: IncredibleEmployment.be  This test is not yet approved or cleared by the [place] FDA and has been authorized for detection and/or diagnosis of SARS-CoV-2 by FDA under an Emergency Use Authorization (EUA). This EUA will remain in effect (meaning this test can be used) for the duration of the COVID-19 declaration under Section 564(b)(1) of the Act, 21 U.S.C. section 360bbb-3(b)(1), unless the authorization is terminated or revoked.  Performed at [facility], [place] [address] [place], [place] [address]       Radiology Studies: No results found.   Marzetta Board, MD, PhD [facility]  Between 7 am - 7 pm I am available, please contact me via Amion (for emergencies) or Securechat (non urgent messages)  Between 7 pm - 7 am I am not available, please contact night coverage MD/APP via Amion

## 2021-06-29 LAB — CBC
HCT: 28.6 % — ABNORMAL LOW (ref 39.0–52.0)
Hemoglobin: 8.9 g/dL — ABNORMAL LOW (ref 13.0–17.0)
MCH: 27.2 pg (ref 26.0–34.0)
MCHC: 31.1 g/dL (ref 30.0–36.0)
MCV: 87.5 fL (ref 80.0–100.0)
Platelets: 185 10*3/uL (ref 150–400)
RBC: 3.27 MIL/uL — ABNORMAL LOW (ref 4.22–5.81)
RDW: 15.5 % (ref 11.5–15.5)
WBC: 8.7 10*3/uL (ref 4.0–10.5)
nRBC: 0 % (ref 0.0–0.2)

## 2021-06-29 NOTE — TOC Transition Note (Signed)
Transition of Care [facility]) - CM/SW Discharge Note   Patient Details  Name: Jordan [name] MRN: [id number] Date of Birth: 06/07/1918  [facility]) CM/SW Contact:  Ross Ludwig, LCSW Phone Number: 06/29/2021, 12:06 PM   Clinical Narrative:     Patient will be going home with home health through [place].  CSW signing off please reconsult with any other social work needs, home health agency has been notified of planned discharge.  Patient's family aware that patient is discharging now and will be transporting him to son's house.    Final next level of care: [place] Barriers to Discharge: Barriers Resolved   Patient Goals and CMS Choice Patient states their goals for this hospitalization and ongoing recovery are:: To go to son's home with home health. CMS Medicare.gov Compare Post Acute Care list provided to:: Patient Represenative (must comment) Choice offered to / list presented to : Adult Children  Discharge Placement                       Discharge Plan and Services   Discharge Planning Services: CM Consult                      HH Arranged: PT, OT, Nurse's Aide HH Agency: [place] Date [place]: 06/29/21 Time [place]: 1205 Representative spoke with at [place]: [place] ([place]) Interventions     Readmission Risk Interventions No flowsheet data found.

## 2021-06-29 NOTE — TOC Progression Note (Deleted)
Transition of Care [facility]) - Progression Note    Patient Details  Name: Jordan [name] MRN: [id number] Date of Birth: 08-29-1918  Transition of [facility]) CM/SW Contact  Ross Ludwig, [place] Phone Number: 06/29/2021, 10:40 AM  Clinical Narrative:     CSW attempted to contact patient's son to find out if they needed EMS transport or if they are going to transport.  CSW left message awaiting for call back.  Expected Discharge Plan: Skilled Nursing Facility Barriers to Discharge: Barriers Resolved  Expected Discharge Plan and Services Expected Discharge Plan: [place]   Discharge Planning Services: CM Consult   Living arrangements for the past 2 months: Single Family Home Expected Discharge Date: 06/29/21                         HH Arranged: OT, PT, Nurse's Aide HH Agency: [place] Date [place]: 06/27/21 Time [place]: 1429 Representative spoke with at [place]: stacie   Social Determinants of Health ([place]) Interventions    Readmission Risk Interventions No flowsheet data found.

## 2021-06-29 NOTE — Progress Notes (Signed)
Pt discharged home with son and daughter in law. Taken out to car via w/c in [place].

## 2021-06-29 NOTE — Discharge Summary (Signed)
Physician Discharge Summary  SOVEREIGN RAMIRO RCV:[id number] DOB: [birth year] DOA: 06/22/2021  PCP: Leanna Battles, MD  Admit date: 06/22/2021 Discharge date: 06/29/2021  Admitted From: home Disposition:  home  Recommendations for Outpatient Follow-up:  Follow up with Dr Milford Cage next week as scheduled Please obtain BMP/CBC in one week  Home Health: PT Equipment/Devices: none  Discharge Condition: stable CODE STATUS: Full code Diet recommendation: regular  HPI: Per admitting MD, Jordan [name] is a 87 y.o. male with medical history significant of obstructive uropathy, GERD, coronary artery disease, abdominal aortic aneurysm, anemia of chronic disease, recurrent hematuria, history of radiation therapy, essential hypertension, hyperlipidemia, history of renal mass on the left, history of heart block who has indwelling Foley catheter and was seen in the ER only yesterday with urinary retention.  Patient returned today with more gross hematuria with clots.  Hemoglobin has dropped significantly down to 6.2.  Previous hemoglobin from June was 10.9.  Patient is having hematuria with some clots.  Urology consulted.  Irrigation of the bladder initiated.  Patient is being transfused and admitted to the medical service for cotreatment with urology.  Hospital Course / Discharge diagnoses: Principal Problem Gross hematuria-With indwelling Foley catheter at baseline.  Has recurrent hematuria.  Urology has seen the patient and received continuous bladder irrigation during hospitalization.  His hematuria has cleared, he was restarted on Plavix 10/12 and urine remains clear. Urology recommends continuation of Foley catheter for at least a week and follow-up in clinic for possible cystoscopy next week.  His hematuria resolved and will be discharged home in stable condition   Active Problems Coronary artery disease-No chest pain.  No active issues at this time.   Essential hypertension -Continue  home regimen on discharge. Hyperlipidemia-Continue with statin Acute blood loss anemia secondary to hematuria  -he was transfused a total of 4 units packed red blood cells, hemoglobin on discharge 8.9.  Hematuria resolved. Chronic kidney disease stage III-Creatinine overall stable Debility, deconditioning, patient was seen by physical therapy who recommended skilled nursing facility placement.  Family wishes for the patient to return home with family so home health was ordered  Sepsis ruled out   Discharge Instructions   Allergies as of 06/29/2021       Reactions   Aspirin Other (See Comments)   Results in stomach bleeds   Tape Other (See Comments)   SKIN IS THIN AND WILL TEAR EASILY!!   Nitroglycerin Other (See Comments)   Makes the patient faint        Medication List     TAKE these medications    acetaminophen 325 MG tablet Commonly known as: TYLENOL Take 650 mg by mouth every 6 (six) hours as needed for mild pain, moderate pain or fever.   amLODipine 5 MG tablet Commonly known as: NORVASC TAKE 1 TABLET BY MOUTH EVERY DAY. NEED OFFICE VISIT What changed: See the new instructions.   atorvastatin 10 MG tablet Commonly known as: LIPITOR TAKE 1 TABLET BY MOUTH EVERY DAY What changed: when to take this   clopidogrel 75 MG tablet Commonly known as: PLAVIX Take 1 tablet (75 mg total) by mouth daily.   finasteride 5 MG tablet Commonly known as: PROSCAR Take 5 mg by mouth every evening.   ketoconazole 2 % cream Commonly known as: NIZORAL Apply 1 application topically See admin instructions. Apply to affected area 2 times a day   NexIUM 24HR 20 MG capsule Generic drug: esomeprazole TAKE 1 CAPSULE BY MOUTH DAILY AT 12 NOON**PLEASE  SCHEDULE AN APPT FOR FUTURE REFILLS What changed: See the new instructions.   torsemide 20 MG tablet Commonly known as: DEMADEX Take 1 tablet (20 mg total) by mouth daily.        Follow-up Information     Leanna Battles, MD  Follow up in 1 week(s).   Specialty: Internal Medicine Why: regular checkup Contact information: [place] [address] [phone]         [place], Peter M, MD .   Specialty: Cardiology Contact information: [address] [place] [address] [phone]         Evans Lance, MD .   Specialty: Cardiology Contact information: [phone] [place] [address] [phone]         Remi Haggard, MD Follow up in 1 week(s).   Specialty: Urology Why: gor catheter managment Contact information: [address] [place] [place] [address] [phone]         [facility], [place] Follow up.   Specialty: [place] Why: PT, OT, aide Contact information: [address] [place] [address] [place] [address] [phone]                 Consultations: Urology   Procedures/Studies:  CT Renal Stone Study  Result Date: 06/22/2021 CLINICAL DATA:  Flank pain. Kidney stones suspected. Patient was discharged earlier today with a Foley catheter with blood in urine bag. EXAM: CT ABDOMEN AND PELVIS WITHOUT CONTRAST TECHNIQUE: Multidetector CT imaging of the abdomen and pelvis was performed following the standard protocol without IV contrast. COMPARISON:  05/16/2015 FINDINGS: Lower chest: Multiple pulmonary nodules are demonstrated. There is a collection of several nodules in the left lingula, largest measuring 1.1 x 2.1 cm diameter. Another nodule is demonstrated in the right costophrenic angle measuring 1 cm diameter. There is also a calcified nodule in the right lung base. Atelectasis in the lower lungs. Cardiac enlargement. Postoperative cardiac valve repairs. Hepatobiliary: No focal liver lesions identified. Cholelithiasis with small layering stones in the gallbladder. No inflammatory changes. No bile duct dilatation superior Pancreas: Unremarkable. No pancreatic ductal dilatation or surrounding inflammatory  changes. Spleen: Spleen is atrophic.  No focal lesions. Adrenals/Urinary Tract: No adrenal gland nodules. Bilateral renal parenchymal atrophy. Multiple cysts in the kidneys are similar to prior study. No hydronephrosis or hydroureter. There is a Foley catheter with balloon in the bladder. Gas is present in the bladder consistent with catheterization. Increased density of the bile is consistent with history of hemorrhage. There is diffuse bladder wall thickening with multiple bladder diverticula and cellule formation. This is likely to represent changes due to chronic outlet obstruction. No bladder stones are identified. Stomach/Bowel: Stomach, small bowel, and colon are not abnormally distended. No wall thickening or inflammatory changes are appreciated. Appendix is not identified. Vascular/Lymphatic: Diffuse aortic calcification. Infrarenal abdominal aortic aneurysm measuring 5.4 cm AP diameter. This is enlarging since the prior study. No retroperitoneal hematoma or lymphadenopathy. Reproductive: Prostate gland is diffusely enlarged. Other: No free air or free fluid in the abdomen. Abdominal wall musculature appears intact. Musculoskeletal: Degenerative changes in the spine and shoulders. No destructive bone lesions. IMPRESSION: 1. Multiple bilateral pulmonary nodules are demonstrated, largest on the left measuring 1.1 x 2.1 cm diameter. Multiple pulmonary nodules. Most severe: 16 mm left solid pulmonary nodule detected on incomplete chest CT. Recommend prompt non-contrast Chest CT for further evaluation. These guidelines do not apply to immunocompromised patients and patients with cancer. Follow up  in patients with significant comorbidities as clinically warranted. For lung cancer screening, adhere to Lung-RADS guidelines. Reference: Radiology. 2017; 284(1):228-43. 2. Infrarenal abdominal aortic aneurysm measuring 5.4 cm, increasing since prior study. 5.4 cm infrarenal abdominal aortic aneurysm. Recommend  follow-up every 6 months and vascular consultation. Reference: J Am Coll Radiol 5277;82:423-536. 3. Cholelithiasis without evidence of acute cholecystitis. 4. Foley catheter in the bladder. Increased density of the urine is likely due to hemorrhage. Multiple bladder diverticula and cellule formation most likely representing chronic outlet obstruction. 5. Prostate gland is diffusely enlarged. 6. Bilateral renal atrophy. No hydronephrosis or hydroureter. No obstructing stones identified. Electronically Signed   By: Lucienne Capers M.D.   On: 06/22/2021 21:34     Subjective: - no chest pain, shortness of breath, no abdominal pain, nausea or vomiting.   Discharge Exam: BP 135/68 (BP Location: Left Arm)   Pulse 78   Temp 98.8 F (37.1 C) (Oral)   Resp 20   Ht 5\' 2"  (1.575 m)   Wt 68.9 kg Comment: per patient  SpO2 91%   BMI 27.80 kg/m   General: Pt is alert, awake, not in acute distress Cardiovascular: RRR, S1/S2 +, no rubs, no gallops Respiratory: CTA bilaterally, no wheezing, no rhonchi Abdominal: Soft, NT, ND, bowel sounds + Extremities: no edema, no cyanosis   The results of significant diagnostics from this hospitalization (including imaging, microbiology, ancillary and laboratory) are listed below for reference.     Microbiology: Recent Results (from the past 240 hour(s))  Resp Panel by RT-PCR (Flu A&B, Covid) Nasopharyngeal Swab     Status: None   Collection Time: 06/22/21 10:58 PM   Specimen: Nasopharyngeal Swab; Nasopharyngeal(NP) swabs in vial transport medium  Result Value Ref Range Status   SARS Coronavirus 2 by RT PCR NEGATIVE NEGATIVE Final    Comment: (NOTE) SARS-CoV-2 target nucleic acids are NOT DETECTED.  The SARS-CoV-2 RNA is generally detectable in upper respiratory specimens during the acute phase of infection. The lowest concentration of SARS-CoV-2 viral copies this assay can detect is 138 copies/mL. A negative result does not preclude SARS-Cov-2 infection  and should not be used as the sole basis for treatment or other patient management decisions. A negative result may occur with  improper specimen collection/handling, submission of specimen other than nasopharyngeal swab, presence of viral mutation(s) within the areas targeted by this assay, and inadequate number of viral copies(<138 copies/mL). A negative result must be combined with clinical observations, patient history, and epidemiological information. The expected result is Negative.  Fact Sheet for Patients:  EntrepreneurPulse.com.au  Fact Sheet for Healthcare Providers:  IncredibleEmployment.be  This test is no t yet approved or cleared by the [place] FDA and  has been authorized for detection and/or diagnosis of SARS-CoV-2 by FDA under an Emergency Use Authorization (EUA). This EUA will remain  in effect (meaning this test can be used) for the duration of the COVID-19 declaration under Section 564(b)(1) of the Act, 21 U.S.C.section 360bbb-3(b)(1), unless the authorization is terminated  or revoked sooner.       Influenza A by PCR NEGATIVE NEGATIVE Final   Influenza B by PCR NEGATIVE NEGATIVE Final    Comment: (NOTE) The Xpert Xpress SARS-CoV-2/FLU/RSV plus assay is intended as an aid in the diagnosis of influenza from Nasopharyngeal swab specimens and should not be used as a sole basis for treatment. Nasal washings and aspirates are unacceptable for Xpert Xpress SARS-CoV-2/FLU/RSV testing.  Fact Sheet for Patients: EntrepreneurPulse.com.au  Fact Sheet for Healthcare Providers: IncredibleEmployment.be  This test is not yet approved or cleared by the [place] and has been authorized for detection and/or diagnosis of SARS-CoV-2 by FDA under an Emergency Use Authorization (EUA). This EUA will remain in effect (meaning this test can be used) for the duration of the COVID-19 declaration  under Section 564(b)(1) of the Act, 21 U.S.C. section 360bbb-3(b)(1), unless the authorization is terminated or revoked.  Performed at [facility] [place] [address], [place], [place] [address]      Labs: Basic Metabolic Panel: Recent Labs  Lab 06/22/21 2123 06/23/21 0545 06/24/21 0535 06/26/21 0338 06/27/21 0322  NA 136 141 138 135 138  K 4.0 3.7 4.1 4.0 4.3  CL 101 106 104 103 105  CO2 28 29 27 26 26   GLUCOSE 116* 112* 97 139* 103*  BUN 34* 32* 28* 22 26*  CREATININE 1.66* 1.65* 1.26* 1.26* 1.26*  CALCIUM 8.4* 8.4* 8.5* 8.1* 8.2*  MG  --   --   --   --  2.2   Liver Function Tests: Recent Labs  Lab 06/23/21 0545  AST 17  ALT 10  ALKPHOS 33*  BILITOT 0.4  PROT 5.6*  ALBUMIN 2.5*   CBC: Recent Labs  Lab 06/22/21 2123 06/23/21 0545 06/24/21 0535 06/25/21 0959 06/26/21 0338 06/27/21 0322 06/28/21 0757 06/29/21 0339  WBC 8.3   < > 8.7 9.0 9.0 8.7 8.0 8.7  NEUTROABS 5.7  --  4.5  --   --   --   --   --   HGB 6.2*   < > 8.5* 8.5* 7.9* 7.8* 7.7* 8.9*  HCT 20.5*   < > 26.7* 26.5* 25.6* 25.1* 25.0* 28.6*  MCV 84.7   < > 84.5 85.5 88.3 87.5 87.1 87.5  PLT 218   < > 191 177 180 184 178 185   < > = values in this interval not displayed.   CBG: No results for input(s): GLUCAP in the last 168 hours. Hgb A1c No results for input(s): HGBA1C in the last 72 hours. Lipid Profile No results for input(s): CHOL, HDL, LDLCALC, TRIG, CHOLHDL, LDLDIRECT in the last 72 hours. Thyroid function studies No results for input(s): TSH, T4TOTAL, T3FREE, THYROIDAB in the last 72 hours.  Invalid input(s): FREET3 Urinalysis    Component Value Date/Time   COLORURINE STRAW (A) 02/20/2021 2015   APPEARANCEUR CLEAR 02/20/2021 2015   LABSPEC 1.009 02/20/2021 2015   PHURINE 7.0 02/20/2021 2015   GLUCOSEU NEGATIVE 02/20/2021 2015   HGBUR SMALL (A) 02/20/2021 2015   BILIRUBINUR NEGATIVE 02/20/2021 2015   KETONESUR NEGATIVE 02/20/2021 2015   PROTEINUR NEGATIVE  02/20/2021 2015   UROBILINOGEN 0.2 05/11/2015 0840   NITRITE NEGATIVE 02/20/2021 2015   LEUKOCYTESUR NEGATIVE 02/20/2021 2015    FURTHER DISCHARGE INSTRUCTIONS:   Get Medicines reviewed and adjusted: Please take all your medications with you for your next visit with your Primary MD   Laboratory/radiological data: Please request your Primary MD to go over all hospital tests and procedure/radiological results at the follow up, please ask your Primary MD to get all Hospital records sent to his/her office.   In some cases, they will be blood work, cultures and biopsy results pending at the time of your discharge. Please request that your primary care M.D. goes through all the records of your hospital data and follows up on these results.   Also Note the following: If you experience worsening of your admission symptoms, develop shortness of breath, life threatening emergency, suicidal or  homicidal thoughts you must seek medical attention immediately by calling 911 or calling your MD immediately  if symptoms less severe.   You must read complete instructions/literature along with all the possible adverse reactions/side effects for all the Medicines you take and that have been prescribed to you. Take any new Medicines after you have completely understood and accpet all the possible adverse reactions/side effects.    Do not drive when taking Pain medications or sleeping medications (Benzodaizepines)   Do not take more than prescribed Pain, Sleep and Anxiety Medications. It is not advisable to combine anxiety,sleep and pain medications without talking with your primary care practitioner   Special Instructions: If you have smoked or chewed Tobacco  in the last 2 yrs please stop smoking, stop any regular Alcohol  and or any Recreational drug use.   Wear Seat belts while driving.   Please note: You were cared for by a hospitalist during your hospital stay. Once you are discharged, your primary care  physician will handle any further medical issues. Please note that NO REFILLS for any discharge medications will be authorized once you are discharged, as it is imperative that you return to your primary care physician (or establish a relationship with a primary care physician if you do not have one) for your post hospital discharge needs so that they can reassess your need for medications and monitor your lab values.  Time coordinating discharge: 40 minutes  SIGNED:  Marzetta Board, MD, PhD 06/29/2021, 7:58 AM

## 2021-07-01 LAB — BPAM RBC
Blood Product Expiration Date: 202211122359
ISSUE DATE / TIME: 202210141448
Unit Type and Rh: 9500

## 2021-07-01 LAB — TYPE AND SCREEN
ABO/RH(D): O NEG
Antibody Screen: NEGATIVE
Unit division: 0

## 2021-07-01 NOTE — TOC CM/SW Note (Addendum)
07/01/2021 6:00pm  This CSW received a phone call from patient's daughter in law [place].  She stated they received a phone call from [facility] scheduler for home health services, and home health is not able to see patient till next Monday the 24th.  Daughter in law was asking why there is such a delay in home health services.  CSW explained to her that maybe it was staffing or due to insurance approval, CSW was expressed not being sure and recommended that [place] speak to them about it.  CSW attempted to contact Stacie at [facility] to let her know that family have concerns about the delay in services, CSW had to leave a message on voice mail.    CSW also sent a secured text to [facility] to have her call patient's family to discuss with them what can be done.  CSW explained that according to most recent PT note SNF was recommended, but family chose to take patient home with home health.  CSW explained that it is easier to get patient's placed in SNF from hospital, but if they realize that patient needs SNF, it can be done from the home by a home health social worker, it just takes longer.  CSW also explained because of the [facility] Medicare, sometimes there is a delay in Biochemist, clinical for home health services.  CSW advised that they try calling [facility] directly as well as this CSW reaching out to [facility] too.

## 2021-07-14 ENCOUNTER — Other Ambulatory Visit: Payer: Self-pay | Admitting: Cardiology

## 2021-07-15 ENCOUNTER — Other Ambulatory Visit: Payer: Self-pay

## 2021-07-15 MED ORDER — ESOMEPRAZOLE MAGNESIUM 20 MG PO CPDR: 20.0000 mg | DELAYED_RELEASE_CAPSULE | Freq: Every day | ORAL | 3 refills | Status: DC

## 2021-07-23 ENCOUNTER — Encounter (HOSPITAL_COMMUNITY): Payer: Self-pay | Admitting: Radiology

## 2021-08-02 ENCOUNTER — Ambulatory Visit (INDEPENDENT_AMBULATORY_CARE_PROVIDER_SITE_OTHER): Payer: Medicare Other

## 2021-08-02 DIAGNOSIS — I442 Atrioventricular block, complete: Secondary | ICD-10-CM

## 2021-08-02 LAB — CUP PACEART REMOTE DEVICE CHECK
Battery Remaining Longevity: 139 mo
Battery Voltage: 3.16 V
Brady Statistic AP VP Percent: 5.32 %
Brady Statistic AP VS Percent: 0.01 %
Brady Statistic AS VP Percent: 94.58 %
Brady Statistic AS VS Percent: 0.09 %
Brady Statistic RA Percent Paced: 5.27 %
Brady Statistic RV Percent Paced: 99.9 %
Date Time Interrogation Session: 20221118001611
Implantable Lead Implant Date: 20040811
Implantable Lead Implant Date: 20040811
Implantable Lead Location: 753859
Implantable Lead Location: 753860
Implantable Lead Model: 4469
Implantable Lead Model: 4470
Implantable Lead Serial Number: 421267
Implantable Lead Serial Number: 436022
Implantable Pulse Generator Implant Date: 20220520
Lead Channel Impedance Value: 304 Ohm
Lead Channel Impedance Value: 323 Ohm
Lead Channel Impedance Value: 361 Ohm
Lead Channel Impedance Value: 437 Ohm
Lead Channel Pacing Threshold Amplitude: 0.875 V
Lead Channel Pacing Threshold Amplitude: 1 V
Lead Channel Pacing Threshold Pulse Width: 0.4 ms
Lead Channel Pacing Threshold Pulse Width: 0.4 ms
Lead Channel Sensing Intrinsic Amplitude: 0.75 mV
Lead Channel Sensing Intrinsic Amplitude: 0.75 mV
Lead Channel Sensing Intrinsic Amplitude: 3 mV
Lead Channel Sensing Intrinsic Amplitude: 3 mV
Lead Channel Setting Pacing Amplitude: 1.75 V
Lead Channel Setting Pacing Amplitude: 2 V
Lead Channel Setting Pacing Pulse Width: 0.4 ms
Lead Channel Setting Sensing Sensitivity: 0.9 mV

## 2021-08-12 NOTE — Progress Notes (Signed)
Remote pacemaker transmission.   

## 2021-08-19 NOTE — Progress Notes (Signed)
Jordan [name] Date of Birth: 1918/08/06   History of Present Illness: Jordan [name] is seen today for evaluation of LE edema.   He has a history of coronary disease with remote stenting of the LAD in 1999. Cardiac catheterization January 2013 showed nonobstructive disease. He has a history of bradycardia requiring pacemaker implant. He has a history of severe aortic stenosis. He underwent TAVR on 05/15/15.  Follow up Echo in September 2017 looked good.   He has a 4.6 cm abdominal aortic aneurysm followed  by [facility]- last checked in October 2019. Stable at 4.7 cm.  Not a candidate for stent grafting.   He was admitted in January 2021 with  a fall. Had post concussion confusion. Son thinks he may have fallen when he got up to urinate at night without the light on. Was orthostatic. Troponin elevated 2562> 2467. Seen by Dr Lovena Le. No clear evidence of ACS by history. Echo was OK and pacemaker checked out. CT of head without acute change. Felt he had some vagal response related to abdominal pain.  He was seen this spring with increased LE edema. Doppler was negative for DVT.  Was placed on lasix 20 mg daily and later switched to torsemide 10 mg daily. This was increased to 20 mg daily on his last visit. He is wearing compression hose still.  He has completed RT for scalp cancer.   He underwent pacemaker generator change out on May 22.   He was seen in the ED in June with urinary retention.   In October he had recurrent urinary retention with hematuria and clots. Had foley placed with continuous irrigation with resolution of hematuria. Foley removed.  He also had symptomatic anemia with Hgb down to 6.2. was transfused. Plavix initially held but was resumed at DC. Hgb at DC 8.9.   Since then he has no recurrent hematuria. No difficulty voiding. Son reports he had follow up labs with PCP and Hgb up above 10. He denies any chest pain or dyspnea. No edema. Had some skin lesions removed recently. He did  have a mild case of Covid in November.    Current Outpatient Medications on File Prior to Visit  Medication Sig Dispense Refill   acetaminophen (TYLENOL) 325 MG tablet Take 650 mg by mouth every 6 (six) hours as needed for mild pain, moderate pain or fever.      amLODipine (NORVASC) 5 MG tablet TAKE 1 TABLET BY MOUTH EVERY DAY. NEED OFFICE VISIT (Patient taking differently: Take 5 mg by mouth in the morning.) 90 tablet 2   atorvastatin (LIPITOR) 10 MG tablet TAKE 1 TABLET BY MOUTH EVERY DAY (Patient taking differently: Take 10 mg by mouth every evening.) 90 tablet 3   clopidogrel (PLAVIX) 75 MG tablet Take 1 tablet (75 mg total) by mouth daily. 90 tablet 3   esomeprazole (NEXIUM) 20 MG capsule Take 1 capsule (20 mg total) by mouth daily at 12 noon. 90 capsule 3   finasteride (PROSCAR) 5 MG tablet Take 5 mg by mouth every evening.     ketoconazole (NIZORAL) 2 % cream Apply 1 application topically See admin instructions. Apply to affected area 2 times a day     pantoprazole (PROTONIX) 40 MG tablet Take 40 mg by mouth daily.     silodosin (RAPAFLO) 4 MG CAPS capsule Take 4 mg by mouth daily.     torsemide (DEMADEX) 20 MG tablet Take 1 tablet (20 mg total) by mouth daily. 90 tablet 3  No current facility-administered medications on file prior to visit.    Allergies  Allergen Reactions   Aspirin Other (See Comments)    Results in stomach bleeds   Tape Other (See Comments)    SKIN IS THIN AND WILL TEAR EASILY!!   Nitroglycerin Other (See Comments)    Makes the patient faint    Past Medical History:  Diagnosis Date   AAA (abdominal aortic aneurysm)    Anemia    Aortic stenosis    Arthritis 09/18/11   "in my knees"   Basal cell carcinoma of skin    Blood transfusion    BPH (benign prostatic hyperplasia)    CAD (coronary artery disease)    Remote stent to LAD in 1999. Last cath in 2004 and he is managed medically   Colon polyps    Complication of anesthesia    Dysrhythmia    paced    GERD (gastroesophageal reflux disease)    Hemorrhoid    History of radiation therapy 05/23/20-06/19/20   Radiation to scalp and RUE , Dr. 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   26 mm Edwards Sapien 3 transcatheter heart valve placed via open left transfemoral approach    Past Surgical History:  Procedure Laterality Date   CARDIAC CATHETERIZATION  2004   Managed medically   CARDIAC CATHETERIZATION     CARDIAC CATHETERIZATION N/A 04/11/2015   Procedure: Right/Left Heart Cath and Coronary Angiography;  Surgeon: Burnell Blanks, MD;  Location: [facility];  Service: Cardiovascular;  Laterality: N/A;   CORONARY ANGIOGRAM  09/19/2011   Procedure: CORONARY ANGIOGRAM;  Surgeon: Josue Hector, MD;  Location: [facility];  Service: Cardiovascular;;   CORONARY ANGIOPLASTY WITH STENT PLACEMENT  05/09/1998   "1"; LAD   INSERT / REPLACE / REMOVE PACEMAKER  2004   initial placement; [employer]   INSERT / REPLACE / REMOVE PACEMAKER  02/14/2011   NASAL HEMORRHAGE CONTROL  1980's   Clips placed to stop bleeding   PPM GENERATOR CHANGEOUT N/A 02/01/2021   Procedure: [employer];  Surgeon: Evans Lance, MD;  Location: [place] [facility];  Service: Cardiovascular;  Laterality: N/A;   SKIN CANCER EXCISION  09/18/11   "have had a couple hundred of them removed since I was in my 65's"   TEE WITHOUT CARDIOVERSION N/A 05/15/2015   Procedure: TRANSESOPHAGEAL ECHOCARDIOGRAM (TEE);  Surgeon: Burnell Blanks, MD;  Location: [place];  Service: Open Heart Surgery;  Laterality: N/A;   TONSILLECTOMY     "when I was a teenager"   TRANSCATHETER AORTIC VALVE REPLACEMENT, TRANSFEMORAL N/A 05/15/2015   Procedure: TRANSCATHETER AORTIC VALVE REPLACEMENT, TRANSFEMORAL;  Surgeon: Burnell Blanks, MD;  Location: [place];   Service: Open Heart Surgery;  Laterality: N/A;    Social History   Tobacco Use  Smoking Status Former   Packs/day: 1.00   Years: 42.00   Pack years: 42.00   Types: Cigarettes   Quit date: 08/18/1974   Years since quitting: 47.0  Smokeless Tobacco Former   Types: Chew    Social History   Substance and Sexual Activity  Alcohol Use No   Alcohol/week: 0.0 standard drinks    Family History  Problem Relation Age of Onset   Cancer Father  stomach   Colon cancer Father    Heart disease Brother    Heart disease Brother    Diabetes Other        granddaughter    Review of Systems: As noted in history of present illness. All other systems were reviewed and are negative.  Physical Exam: BP (!) 110/58 (BP Location: Left Arm)   Pulse (!) 48   Ht 5\' 2"  (1.575 m)   Wt 149 lb (67.6 kg)   SpO2 96%   BMI 27.25 kg/m  GENERAL:  Well appearing elderly WM in NAD HEENT:  PERRL, EOMI, sclera are clear. Oropharynx is clear. NECK:  No jugular venous distention, carotid upstroke brisk and symmetric, no bruits, no thyromegaly or adenopathy LUNGS:  clear CHEST:  Unremarkable HEART:  RRR,  PMI not displaced or sustained,S1 and S2 within normal limits, no S3, no S4: no clicks, no rubs, gr 2/6 systolic murmur. ABD:  Soft, nontender. BS +, no masses or bruits. No hepatomegaly, no splenomegaly EXT:  Pedal pulses are palpable, no pedal edema. No increased redness or warmth. SKIN:  Warm and dry.  No rashes NEURO:  Alert and oriented x 3. Cranial nerves II through XII intact. PSYCH:  Cognitively intact    LABORATORY DATA: Lab Results  Component Value Date   WBC 8.7 06/29/2021   HGB 8.9 (L) 06/29/2021   HCT 28.6 (L) 06/29/2021   PLT 185 06/29/2021   GLUCOSE 103 (H) 06/27/2021   CHOL 140 11/02/2020   TRIG 211 (H) 11/02/2020   HDL 36 (L) 11/02/2020   LDLCALC 69 11/02/2020   ALT 10 06/23/2021   AST 17 06/23/2021   NA 138 06/27/2021   K 4.3 06/27/2021   CL 105 06/27/2021    CREATININE 1.26 (H) 06/27/2021   BUN 26 (H) 06/27/2021   CO2 26 06/27/2021   TSH 0.797 09/18/2011   INR 1.1 06/22/2021   HGBA1C 5.5 05/11/2015   Labs dated 07/05/18: Normal CBC and CMET. Cholesterol 120, triglycerides 131, HDL 36, LDL 58.   Echo: 05/28/16:  Study Conclusions   - Left ventricle: The cavity size was mildly dilated. Wall   thickness was increased in a pattern of moderate LVH. Systolic   function was normal. The estimated ejection fraction was in the   range of 60% to 65%. Doppler parameters are consistent with   abnormal left ventricular relaxation (grade 1 diastolic   dysfunction). - Aortic valve: 26 mm Sapien 3 valve in excellent position with no   perivalvular regurgitation and stable gradients. - Mitral valve: Calcified annulus. Moderately thickened leaflets .   There was mild regurgitation. Valve area by pressure half-time:   2.5 cm^2. - Left atrium: The atrium was moderately dilated. - Atrial septum: No defect or patent foramen ovale was identified.   Echo 09/25/19:IMPRESSIONS     1. Left ventricular ejection fraction, by visual estimation, is 60 to  65%. The left ventricle has normal function. There is mildly increased  left ventricular hypertrophy.   2. Elevated left atrial pressure.   3. Left ventricular diastolic parameters are consistent with Grade I  diastolic dysfunction (impaired relaxation).   4. The left ventricle has no regional wall motion abnormalities.   5. Global right ventricle has normal systolic function.The right  ventricular size is normal.   6. Left atrial size was mildly dilated.   7. Right atrial size was normal.   8. Severe mitral annular calcification.   9. The mitral valve is normal in structure. Mild mitral  valve  regurgitation. No evidence of mitral stenosis.  10. The tricuspid valve is normal in structure.  11. The aortic valve is normal in structure. Aortic valve regurgitation is  not visualized. No evidence of aortic valve  sclerosis or stenosis.  12. The pulmonic valve was normal in structure. Pulmonic valve  regurgitation is not visualized.  13. The inferior vena cava is normal in size with greater than 50%  respiratory variability, suggesting right atrial pressure of 3 mmHg.  14. Normal LV systolic function; mild LVH; grade 1 diastolic dysfunction;  s/p TAVR with mean gradient 10 mmHg; AVA 1.8 cm2) and no AI; mild LAE;  mild MR.   Assessment / Plan: 1. Coronary disease with remote stenting of the LAD in 1999. Cardiac catheterization Jan 2013 showed nonobstructive disease. He is asymptomatic. We will continue with Plavix and statin therapy. If he were to have significant recurrent bleeding we may have to stop Plavix.   2. Severe aortic stenosis.s/p TAVR in August 2016. Excellent result. Follow up Echo in January 2021 looked good. Exam is stable.    3. Sinus node dysfunction with bradycardia. Status post pacemaker implant. Pacemaker followup is satisfactory. In November  4. Hyperlipidemia. On chronic Crestor therapy. LDL 69.  5. AAA 4.7 cm. Followed by [facility]. Not a candidate for stent grafting. He is a poor candidate for open repair. Last [place] October 2019 was stable. Since he is not a candidate for repair we are not really following at this point.   6. Renal mass. Followed by urology. Patient reports this is stable/smaller.   7. Edema. Due to venous insufficiency. Improved. Reinforced importance of sodium restriction, fluid restriction. Will continue current diuretic dose.   8. Urinary retention with hematuria- resolved.   9. Symptomatic anemia. Follow up Hgb reported to be improved.   I will follow up in 6 months.

## 2021-08-23 ENCOUNTER — Encounter: Payer: Self-pay | Admitting: Cardiology

## 2021-08-23 ENCOUNTER — Other Ambulatory Visit: Payer: Self-pay

## 2021-08-23 ENCOUNTER — Ambulatory Visit: Payer: Medicare Other | Admitting: Cardiology

## 2021-08-23 VITALS — BP 110/58 | HR 48 | Ht 62.0 in | Wt 149.0 lb

## 2021-08-23 DIAGNOSIS — I442 Atrioventricular block, complete: Secondary | ICD-10-CM | POA: Diagnosis not present

## 2021-08-23 DIAGNOSIS — I2510 Atherosclerotic heart disease of native coronary artery without angina pectoris: Secondary | ICD-10-CM | POA: Diagnosis not present

## 2021-08-23 DIAGNOSIS — Z950 Presence of cardiac pacemaker: Secondary | ICD-10-CM | POA: Diagnosis not present

## 2021-08-23 DIAGNOSIS — Z953 Presence of xenogenic heart valve: Secondary | ICD-10-CM

## 2021-08-23 DIAGNOSIS — R600 Localized edema: Secondary | ICD-10-CM

## 2021-10-24 ENCOUNTER — Telehealth: Payer: Self-pay

## 2021-10-24 NOTE — Telephone Encounter (Signed)
Pt left voicemail to make an appointment with Dr. Lovena Le mid or late April. His phone number is [phone].

## 2021-11-01 ENCOUNTER — Ambulatory Visit (INDEPENDENT_AMBULATORY_CARE_PROVIDER_SITE_OTHER): Payer: Medicare Other

## 2021-11-01 DIAGNOSIS — I442 Atrioventricular block, complete: Secondary | ICD-10-CM | POA: Diagnosis not present

## 2021-11-01 LAB — CUP PACEART REMOTE DEVICE CHECK
Battery Remaining Longevity: 121 mo
Battery Voltage: 3.09 V
Brady Statistic AP VP Percent: 28.42 %
Brady Statistic AP VS Percent: 0.02 %
Brady Statistic AS VP Percent: 71.45 %
Brady Statistic AS VS Percent: 0.1 %
Brady Statistic RA Percent Paced: 28.22 %
Brady Statistic RV Percent Paced: 99.88 %
Date Time Interrogation Session: 20230217001657
Implantable Lead Implant Date: 20040811
Implantable Lead Implant Date: 20040811
Implantable Lead Location: 753859
Implantable Lead Location: 753860
Implantable Lead Model: 4469
Implantable Lead Model: 4470
Implantable Lead Serial Number: 421267
Implantable Lead Serial Number: 436022
Implantable Pulse Generator Implant Date: 20220520
Lead Channel Impedance Value: 285 Ohm
Lead Channel Impedance Value: 285 Ohm
Lead Channel Impedance Value: 323 Ohm
Lead Channel Impedance Value: 399 Ohm
Lead Channel Pacing Threshold Amplitude: 0.75 V
Lead Channel Pacing Threshold Amplitude: 1.125 V
Lead Channel Pacing Threshold Pulse Width: 0.4 ms
Lead Channel Pacing Threshold Pulse Width: 0.4 ms
Lead Channel Sensing Intrinsic Amplitude: 1.625 mV
Lead Channel Sensing Intrinsic Amplitude: 1.625 mV
Lead Channel Sensing Intrinsic Amplitude: 5.5 mV
Lead Channel Sensing Intrinsic Amplitude: 5.5 mV
Lead Channel Setting Pacing Amplitude: 1.5 V
Lead Channel Setting Pacing Amplitude: 2.25 V
Lead Channel Setting Pacing Pulse Width: 0.4 ms
Lead Channel Setting Sensing Sensitivity: 0.9 mV

## 2021-11-07 NOTE — Progress Notes (Signed)
Remote pacemaker transmission.   

## 2021-11-08 ENCOUNTER — Other Ambulatory Visit (HOSPITAL_COMMUNITY): Payer: Self-pay | Admitting: *Deleted

## 2021-11-11 ENCOUNTER — Encounter (HOSPITAL_COMMUNITY)
Admission: RE | Admit: 2021-11-11 | Discharge: 2021-11-11 | Disposition: A | Discharge status: Home / Self Care | Payer: Medicare Other | Source: Ambulatory Visit | Attending: Internal Medicine | Admitting: Internal Medicine

## 2021-11-11 DIAGNOSIS — D649 Anemia, unspecified: Secondary | ICD-10-CM | POA: Diagnosis not present

## 2021-11-11 MED ORDER — SODIUM CHLORIDE 0.9 % IV SOLN
510.0000 mg | INTRAVENOUS | Status: DC
  Administered 2021-11-11: 510 mg via INTRAVENOUS
  Filled 2021-11-11: qty 510

## 2021-11-18 ENCOUNTER — Ambulatory Visit (HOSPITAL_COMMUNITY)
Admission: RE | Admit: 2021-11-18 | Discharge: 2021-11-18 | Disposition: A | Discharge status: Home / Self Care | Payer: Medicare Other | Source: Ambulatory Visit | Attending: Interventional Cardiology | Admitting: Interventional Cardiology

## 2021-11-18 ENCOUNTER — Other Ambulatory Visit: Payer: Self-pay

## 2021-11-18 DIAGNOSIS — D649 Anemia, unspecified: Secondary | ICD-10-CM | POA: Insufficient documentation

## 2021-11-18 MED ORDER — SODIUM CHLORIDE 0.9 % IV SOLN
510.0000 mg | INTRAVENOUS | Status: DC
  Administered 2021-11-18: 510 mg via INTRAVENOUS
  Filled 2021-11-18: qty 510

## 2021-12-31 ENCOUNTER — Other Ambulatory Visit: Payer: Self-pay | Admitting: Cardiology

## 2022-01-31 ENCOUNTER — Ambulatory Visit (INDEPENDENT_AMBULATORY_CARE_PROVIDER_SITE_OTHER): Payer: Medicare Other

## 2022-01-31 DIAGNOSIS — I442 Atrioventricular block, complete: Secondary | ICD-10-CM | POA: Diagnosis not present

## 2022-02-02 LAB — CUP PACEART REMOTE DEVICE CHECK
Battery Remaining Longevity: 120 mo
Battery Voltage: 3.04 V
Brady Statistic AP VP Percent: 18.55 %
Brady Statistic AP VS Percent: 0.01 %
Brady Statistic AS VP Percent: 81.24 %
Brady Statistic AS VS Percent: 0.21 %
Brady Statistic RA Percent Paced: 18.4 %
Brady Statistic RV Percent Paced: 99.78 %
Date Time Interrogation Session: 20230519011939
Implantable Lead Implant Date: 20040811
Implantable Lead Implant Date: 20040811
Implantable Lead Location: 753859
Implantable Lead Location: 753860
Implantable Lead Model: 4469
Implantable Lead Model: 4470
Implantable Lead Serial Number: 421267
Implantable Lead Serial Number: 436022
Implantable Pulse Generator Implant Date: 20220520
Lead Channel Impedance Value: 285 Ohm
Lead Channel Impedance Value: 304 Ohm
Lead Channel Impedance Value: 342 Ohm
Lead Channel Impedance Value: 399 Ohm
Lead Channel Pacing Threshold Amplitude: 0.875 V
Lead Channel Pacing Threshold Amplitude: 1.125 V
Lead Channel Pacing Threshold Pulse Width: 0.4 ms
Lead Channel Pacing Threshold Pulse Width: 0.4 ms
Lead Channel Sensing Intrinsic Amplitude: 1.125 mV
Lead Channel Sensing Intrinsic Amplitude: 1.125 mV
Lead Channel Sensing Intrinsic Amplitude: 2.375 mV
Lead Channel Sensing Intrinsic Amplitude: 2.375 mV
Lead Channel Setting Pacing Amplitude: 1.75 V
Lead Channel Setting Pacing Amplitude: 2.25 V
Lead Channel Setting Pacing Pulse Width: 0.4 ms
Lead Channel Setting Sensing Sensitivity: 0.9 mV

## 2022-02-06 NOTE — Progress Notes (Signed)
Remote pacemaker transmission.   

## 2022-02-20 NOTE — Progress Notes (Unsigned)
Jordan [name] Date of Birth: 02-10-1918   History of Present Illness: Jordan [name] is seen today for follow up   He has a history of coronary disease with remote stenting of the LAD in 1999. Cardiac catheterization January 2013 showed nonobstructive disease. He has a history of bradycardia requiring pacemaker implant. He has a history of severe aortic stenosis. He underwent TAVR on 05/15/15.  Follow up Echo in September 2017 looked good.   He has a 4.6 cm abdominal aortic aneurysm followed  by [facility]- last checked in October 2019. Stable at 4.7 cm.  Not a candidate for stent grafting.   He was admitted in January 2021 with  a fall. Had post concussion confusion. Son thinks he may have fallen when he got up to urinate at night without the light on. Was orthostatic. Troponin elevated 2562> 2467. Seen by Dr Lovena Le. No clear evidence of ACS by history. Echo was OK and pacemaker checked out. CT of head without acute change. Felt he had some vagal response related to abdominal pain.  He was seen this spring with increased LE edema. Doppler was negative for DVT.  Was placed on lasix 20 mg daily and later switched to torsemide 10 mg daily. This was increased to 20 mg daily on his last visit. He is wearing compression hose still.  He has completed RT for scalp cancer.   He underwent pacemaker generator change out on May 22.   He was seen in the ED in June with urinary retention.   In October he had recurrent urinary retention with hematuria and clots. Had foley placed with continuous irrigation with resolution of hematuria. Foley removed.  He also had symptomatic anemia with Hgb down to 6.2. was transfused. Plavix initially held but was resumed at DC. Hgb at DC 8.9.   Since then he has no recurrent hematuria. No difficulty voiding.  He denies any chest pain or dyspnea. No edema. 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    Current Outpatient  Medications on File Prior to Visit  Medication Sig Dispense Refill   acetaminophen (TYLENOL) 325 MG tablet Take 650 mg by mouth every 6 (six) hours as needed for mild pain, moderate pain or fever.      amLODipine (NORVASC) 5 MG tablet TAKE 1 TABLET BY MOUTH EVERY DAY. 90 tablet 2   atorvastatin (LIPITOR) 10 MG tablet TAKE 1 TABLET BY MOUTH EVERY DAY (Patient taking differently: Take 10 mg by mouth every evening.) 90 tablet 3   clopidogrel (PLAVIX) 75 MG tablet Take 1 tablet (75 mg total) by mouth daily. 90 tablet 3   finasteride (PROSCAR) 5 MG tablet Take 5 mg by mouth every evening.     pantoprazole (PROTONIX) 40 MG tablet Take 40 mg by mouth daily.     silodosin (RAPAFLO) 4 MG CAPS capsule Take 4 mg by mouth daily.     torsemide (DEMADEX) 20 MG tablet Take 1 tablet (20 mg total) by mouth daily. 90 tablet 3   No current facility-administered medications on file prior to visit.    Allergies  Allergen Reactions   Aspirin Other (See Comments)    Results in stomach bleeds   Tape Other (See Comments)    SKIN IS THIN AND WILL TEAR EASILY!!   Nitroglycerin Other (See Comments)    Makes the patient faint    Past Medical History:  Diagnosis Date   AAA (abdominal  aortic aneurysm) ([place])    Anemia    Aortic stenosis    Arthritis 09/18/11   "in my knees"   Basal cell carcinoma of skin    Blood transfusion    BPH (benign prostatic hyperplasia)    CAD (coronary artery disease)    Remote stent to LAD in 1999. Last cath in 2004 and he is managed medically   Colon polyps    Complication of anesthesia    Dysrhythmia    paced   GERD (gastroesophageal reflux disease)    Hemorrhoid    History of radiation therapy 05/23/20-06/19/20   Radiation to scalp and RUE , Dr. 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   26 mm Edwards  Sapien 3 transcatheter heart valve placed via open left transfemoral approach    Past Surgical History:  Procedure Laterality Date   CARDIAC CATHETERIZATION  2004   Managed medically   CARDIAC CATHETERIZATION     CARDIAC CATHETERIZATION N/A 04/11/2015   Procedure: Right/Left Heart Cath and Coronary Angiography;  Surgeon: Burnell Blanks, MD;  Location: [place] [facility];  Service: Cardiovascular;  Laterality: N/A;   CORONARY ANGIOGRAM  09/19/2011   Procedure: CORONARY ANGIOGRAM;  Surgeon: Josue Hector, MD;  Location: [facility];  Service: Cardiovascular;;   CORONARY ANGIOPLASTY WITH STENT PLACEMENT  05/09/1998   "1"; LAD   INSERT / REPLACE / REMOVE PACEMAKER  2004   initial placement; [employer]   INSERT / REPLACE / REMOVE PACEMAKER  02/14/2011   NASAL HEMORRHAGE CONTROL  1980's   Clips placed to stop bleeding   PPM GENERATOR CHANGEOUT N/A 02/01/2021   Procedure: [place];  Surgeon: Evans Lance, MD;  Location: [place] [facility];  Service: Cardiovascular;  Laterality: N/A;   SKIN CANCER EXCISION  09/18/11   "have had a couple hundred of them removed since I was in my 56's"   TEE WITHOUT CARDIOVERSION N/A 05/15/2015   Procedure: TRANSESOPHAGEAL ECHOCARDIOGRAM (TEE);  Surgeon: Burnell Blanks, MD;  Location: [place];  Service: Open Heart Surgery;  Laterality: N/A;   TONSILLECTOMY     "when I was a teenager"   TRANSCATHETER AORTIC VALVE REPLACEMENT, TRANSFEMORAL N/A 05/15/2015   Procedure: TRANSCATHETER AORTIC VALVE REPLACEMENT, TRANSFEMORAL;  Surgeon: Burnell Blanks, MD;  Location: [place];  Service: Open Heart Surgery;  Laterality: N/A;    Social History   Tobacco Use  Smoking Status Former   Packs/day: 1.00   Years: 42.00   Total pack years: 42.00   Types: Cigarettes   Quit date: 08/18/1974   Years since quitting: 47.5  Smokeless Tobacco Former   Types: Chew    Social History   Substance and Sexual Activity  Alcohol Use No   Alcohol/week:  0.0 standard drinks of alcohol    Family History  Problem Relation Age of Onset   Cancer Father        stomach   Colon cancer Father    Heart disease Brother    Heart disease Brother    Diabetes Other        granddaughter    Review of Systems: As noted in history of present illness. All other systems were reviewed and are negative.  Physical Exam: BP 110/60   Pulse 87  Ht '5\' 2"'$  (1.575 m)   Wt 144 lb 6.4 oz (65.5 kg)   SpO2 96%   BMI 26.41 kg/m  GENERAL:  Well appearing elderly WM in NAD HEENT:  PERRL, EOMI, sclera are clear. Oropharynx is clear. NECK:  No jugular venous distention, carotid upstroke brisk and symmetric, no bruits, no thyromegaly or adenopathy LUNGS:  clear CHEST:  Unremarkable HEART:  RRR,  PMI not displaced or sustained,S1 and S2 within normal limits, no S3, no S4: no clicks, no rubs, gr 2/6 systolic murmur. ABD:  Soft, nontender. BS +, no masses or bruits. No hepatomegaly, no splenomegaly EXT:  Pedal pulses are palpable, no pedal edema. N SKIN:  Warm and dry.  No rashes NEURO:  Alert and oriented x 3. Cranial nerves II through XII intact. PSYCH:  Cognitively intact    LABORATORY DATA: Lab Results  Component Value Date   WBC 8.7 06/29/2021   HGB 8.9 (L) 06/29/2021   HCT 28.6 (L) 06/29/2021   PLT 185 06/29/2021   GLUCOSE 103 (H) 06/27/2021   CHOL 140 11/02/2020   TRIG 211 (H) 11/02/2020   HDL 36 (L) 11/02/2020   LDLCALC 69 11/02/2020   ALT 10 06/23/2021   AST 17 06/23/2021   NA 138 06/27/2021   K 4.3 06/27/2021   CL 105 06/27/2021   CREATININE 1.26 (H) 06/27/2021   BUN 26 (H) 06/27/2021   CO2 26 06/27/2021   TSH 0.797 09/18/2011   INR 1.1 06/22/2021   HGBA1C 5.5 05/11/2015   Labs dated 07/05/18: Normal CBC and CMET. Cholesterol 120, triglycerides 131, HDL 36, LDL 58.  Dated 07/16/21: cholesterol 126, triglycerides 58, HDL 42, LDL 72. Normal LFTs   Ecg today shows A sensing and V pacing rate 87. I have personally reviewed and  interpreted this study.   Echo: 05/28/16:  Study Conclusions   - Left ventricle: The cavity size was mildly dilated. Wall   thickness was increased in a pattern of moderate LVH. Systolic   function was normal. The estimated ejection fraction was in the   range of 60% to 65%. Doppler parameters are consistent with   abnormal left ventricular relaxation (grade 1 diastolic   dysfunction). - Aortic valve: 26 mm Sapien 3 valve in excellent position with no   perivalvular regurgitation and stable gradients. - Mitral valve: Calcified annulus. Moderately thickened leaflets .   There was mild regurgitation. Valve area by pressure half-time:   2.5 cm^2. - Left atrium: The atrium was moderately dilated. - Atrial septum: No defect or patent foramen ovale was identified.   Echo 09/25/19:IMPRESSIONS     1. Left ventricular ejection fraction, by visual estimation, is 60 to  65%. The left ventricle has normal function. There is mildly increased  left ventricular hypertrophy.   2. Elevated left atrial pressure.   3. Left ventricular diastolic parameters are consistent with Grade I  diastolic dysfunction (impaired relaxation).   4. The left ventricle has no regional wall motion abnormalities.   5. Global right ventricle has normal systolic function.The right  ventricular size is normal.   6. Left atrial size was mildly dilated.   7. Right atrial size was normal.   8. Severe mitral annular calcification.   9. The mitral valve is normal in structure. Mild mitral valve  regurgitation. No evidence of mitral stenosis.  10. The tricuspid valve is normal in structure.  11. The aortic valve is normal in structure. Aortic valve regurgitation is  not visualized. No evidence of aortic valve  sclerosis or stenosis.  12. The pulmonic valve was normal in structure. Pulmonic valve  regurgitation is not visualized.  13. The inferior vena cava is normal in size with greater than 50%  respiratory variability,  suggesting right atrial pressure of 3 mmHg.  14. Normal LV systolic function; mild LVH; grade 1 diastolic dysfunction;  s/p TAVR with mean gradient 10 mmHg; AVA 1.8 cm2) and no AI; mild LAE;  mild MR.   Assessment / Plan: 1. Coronary disease with remote stenting of the LAD in 1999. Cardiac catheterization Jan 2013 showed nonobstructive disease. He is asymptomatic. We will continue with Plavix and statin therapy.   2. Severe aortic stenosis.s/p TAVR in August 2016. Excellent result. Follow up Echo in January 2021 looked good. Exam is stable.    3. Sinus node dysfunction with bradycardia. Status post pacemaker implant. Pacemaker followup is satisfactory. In May  4. Hyperlipidemia. On chronic Crestor therapy. LDL 69.  5. AAA 4.7 cm. Followed by [facility]. Not a candidate for stent grafting. He is a poor candidate for open repair. Last [place] October 2019 was stable. Since he is not a candidate for repair we are not really following at this point.   6. Renal mass. Followed by urology.   7. Edema. Due to venous insufficiency. Improved. Reinforced importance of sodium restriction, fluid restriction. Will continue current diuretic dose.   8. Urinary retention with hematuria- resolved.   9. Symptomatic anemia. Follow up Hgb reported to be improved.   I will follow up in 6 months.

## 2022-02-24 ENCOUNTER — Ambulatory Visit (INDEPENDENT_AMBULATORY_CARE_PROVIDER_SITE_OTHER): Payer: Medicare Other | Admitting: Cardiology

## 2022-02-24 ENCOUNTER — Encounter: Payer: Self-pay | Admitting: Cardiology

## 2022-02-24 VITALS — BP 110/60 | HR 87 | Ht 62.0 in | Wt 144.4 lb

## 2022-02-24 DIAGNOSIS — I2510 Atherosclerotic heart disease of native coronary artery without angina pectoris: Secondary | ICD-10-CM | POA: Diagnosis not present

## 2022-02-24 DIAGNOSIS — Z950 Presence of cardiac pacemaker: Secondary | ICD-10-CM

## 2022-02-24 DIAGNOSIS — I442 Atrioventricular block, complete: Secondary | ICD-10-CM | POA: Diagnosis not present

## 2022-02-24 DIAGNOSIS — Z953 Presence of xenogenic heart valve: Secondary | ICD-10-CM | POA: Diagnosis not present

## 2022-02-24 DIAGNOSIS — I441 Atrioventricular block, second degree: Secondary | ICD-10-CM

## 2022-02-24 MED ORDER — ATORVASTATIN CALCIUM 10 MG PO TABS: 10.0000 mg | ORAL_TABLET | Freq: Every evening | ORAL | 3 refills | Status: DC

## 2022-02-24 MED ORDER — CLOPIDOGREL BISULFATE 75 MG PO TABS: 75.0000 mg | ORAL_TABLET | Freq: Every day | ORAL | 3 refills | Status: DC

## 2022-02-24 MED ORDER — TORSEMIDE 20 MG PO TABS: 20.0000 mg | ORAL_TABLET | Freq: Every day | ORAL | 3 refills | Status: DC

## 2022-02-24 NOTE — Patient Instructions (Signed)

## 2022-02-24 NOTE — Addendum Note (Signed)
Addended by: Fidel Levy on: 02/24/2022 11:38 AM   Modules accepted: Orders

## 2022-05-02 ENCOUNTER — Ambulatory Visit (INDEPENDENT_AMBULATORY_CARE_PROVIDER_SITE_OTHER): Payer: Medicare Other

## 2022-05-02 DIAGNOSIS — I442 Atrioventricular block, complete: Secondary | ICD-10-CM

## 2022-05-02 LAB — CUP PACEART REMOTE DEVICE CHECK
Battery Remaining Longevity: 115 mo
Battery Voltage: 3.02 V
Brady Statistic AP VP Percent: 21.39 %
Brady Statistic AP VS Percent: 0.01 %
Brady Statistic AS VP Percent: 77.58 %
Brady Statistic AS VS Percent: 1.02 %
Brady Statistic RA Percent Paced: 21.46 %
Brady Statistic RV Percent Paced: 98.97 %
Date Time Interrogation Session: 20230818011858
Implantable Lead Implant Date: 20040811
Implantable Lead Implant Date: 20040811
Implantable Lead Location: 753859
Implantable Lead Location: 753860
Implantable Lead Model: 4469
Implantable Lead Model: 4470
Implantable Lead Serial Number: 421267
Implantable Lead Serial Number: 436022
Implantable Pulse Generator Implant Date: 20220520
Lead Channel Impedance Value: 266 Ohm
Lead Channel Impedance Value: 285 Ohm
Lead Channel Impedance Value: 323 Ohm
Lead Channel Impedance Value: 380 Ohm
Lead Channel Pacing Threshold Amplitude: 0.75 V
Lead Channel Pacing Threshold Amplitude: 1.125 V
Lead Channel Pacing Threshold Pulse Width: 0.4 ms
Lead Channel Pacing Threshold Pulse Width: 0.4 ms
Lead Channel Sensing Intrinsic Amplitude: 1.125 mV
Lead Channel Sensing Intrinsic Amplitude: 1.125 mV
Lead Channel Sensing Intrinsic Amplitude: 3.25 mV
Lead Channel Sensing Intrinsic Amplitude: 3.25 mV
Lead Channel Setting Pacing Amplitude: 1.5 V
Lead Channel Setting Pacing Amplitude: 2.25 V
Lead Channel Setting Pacing Pulse Width: 0.4 ms
Lead Channel Setting Sensing Sensitivity: 0.9 mV

## 2022-05-08 ENCOUNTER — Other Ambulatory Visit (HOSPITAL_COMMUNITY): Payer: Self-pay

## 2022-05-09 ENCOUNTER — Ambulatory Visit (HOSPITAL_COMMUNITY)
Admission: RE | Admit: 2022-05-09 | Discharge: 2022-05-09 | Disposition: A | Discharge status: Home / Self Care | Payer: Medicare Other | Source: Ambulatory Visit | Attending: Internal Medicine | Admitting: Internal Medicine

## 2022-05-09 DIAGNOSIS — D649 Anemia, unspecified: Secondary | ICD-10-CM | POA: Diagnosis not present

## 2022-05-09 MED ORDER — SODIUM CHLORIDE 0.9 % IV SOLN
510.0000 mg | INTRAVENOUS | Status: DC
  Administered 2022-05-09: 510 mg via INTRAVENOUS
  Filled 2022-05-09: qty 510

## 2022-05-16 ENCOUNTER — Ambulatory Visit (HOSPITAL_COMMUNITY)
Admission: RE | Admit: 2022-05-16 | Discharge: 2022-05-16 | Disposition: A | Discharge status: Home / Self Care | Payer: Medicare Other | Source: Ambulatory Visit | Attending: Internal Medicine | Admitting: Internal Medicine

## 2022-05-16 DIAGNOSIS — D649 Anemia, unspecified: Secondary | ICD-10-CM | POA: Insufficient documentation

## 2022-05-16 MED ORDER — SODIUM CHLORIDE 0.9 % IV SOLN
510.0000 mg | INTRAVENOUS | Status: DC
  Administered 2022-05-16: 510 mg via INTRAVENOUS
  Filled 2022-05-16: qty 510

## 2022-05-21 ENCOUNTER — Encounter: Payer: Self-pay | Admitting: Gastroenterology

## 2022-05-21 ENCOUNTER — Ambulatory Visit: Payer: Medicare Other | Admitting: Gastroenterology

## 2022-05-21 ENCOUNTER — Other Ambulatory Visit: Payer: Self-pay | Admitting: *Deleted

## 2022-05-21 VITALS — BP 100/70 | HR 91 | Ht 62.0 in | Wt 141.1 lb

## 2022-05-21 DIAGNOSIS — D509 Iron deficiency anemia, unspecified: Secondary | ICD-10-CM | POA: Diagnosis not present

## 2022-05-21 MED ORDER — PANTOPRAZOLE SODIUM 40 MG PO TBEC: 40.0000 mg | DELAYED_RELEASE_TABLET | Freq: Two times a day (BID) | ORAL | 5 refills | Status: DC

## 2022-05-21 NOTE — Patient Instructions (Addendum)
It was a pleasure to meet you today.  GI bleeding may be contributing to your anemia.  We discussed strategies to evaluate and/or treat.   We agreed to increase the pantoprazole to 40 mg twice daily for 8 weeks and then reduce to 40 mg daily.  I recommend that you avoid all NSAIDs including ibuprofen, Aleve, Advil, and Naproxen.   I recommend that you have labs done every 3-6 months and receive IV iron as needed. I would be happy to refer you to a blood doctor (hematologist) to make these arrangements if needed.   We discussed your prior endoscopies. You had a colonoscopies with Dr. Rachelle Hora, including 1991, 1992, 1994, in 1999. You had a colonoscopy and EGD in 2012 with Dr. Henrene Pastor to evaluate for anemia. The EGD was normal. The colonoscopy showed 3 polyps, ascending colon AVM, and sigmoid diverticulosis. Two of the polyps were adenomas.   Please contact me with any GI bleeding that might develop.

## 2022-05-21 NOTE — Progress Notes (Signed)
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 But, there is concern for possible GI blood loss anemia. AVMs are likely given Jordan history and lack of recent overt GI bleeding and symptoms. However, other causes must be considered including recurrent peptic ulcer disease.   We discussed the evaluation for GI blood loss anemia including observation only, radiology studies, or endoscopic evaluation. We discussed the possibility of no significant underlying pathology or more significant pathology, including cancer. We discussed the risks and benefits of the different strategies. We are all hesitant to proceed with endoscopic evaluation given Jordan advanced age, particularly if we can evaluate for GI bleeding non-invasively. At this point, he isn't even interested in abdominal imaging let alone endoscopic evaluation. He would prefer serial labs and IV iron/PRBCs as needed. Consider hematology consultation for long-term management of IV iron.    PLAN: - Increase pantoprazole to 40 mg BID for 8 weeks, then resume 40 mg daily - Serial anemia labs with IV iron as needed, he notes this is already planned with Dr. Philip Aspen - No radiographic or endoscopic evaluation planned at this time - Follow-up PRN   HPI: Jordan [name] is a 88 y.o. male referred for symptomatic anemia by Dr. Sharlett Iles.  The history is obtained through the patient, Jordan son, Jordan [name], who accompanies him to this appointment, review of paper records provided by  Dr. Sharlett Iles, and review of Jordan electronic health record.  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  He had COVID in January 2023.  He is on chronic Plavix.  He was seen by Dr. Henrene Pastor in 2012 for hemoccult positive stools and anemia with a hemoglobin of 10.4. Notes at that tie mention NSAID-induced ulcers. EGD was normal. Colonoscopy showed 3 polyps, sigmoid diverticulosis, and an ascending colon AVM. No further GI follow-up since that time.   He had urinary retention with hematuria and clots in October 2022 that resolved with Foley placement with continuous irrigation.  At the time of hospitalization Jordan hemoglobin had decreased to 6.2.  He received 4 units of packed red blood cells.  Jordan Plavix was held and resumed on discharge with a hemoglobin of 8.9.    Has had 4 iron infusions over the last 6 months without any overt bleeding during that time.   Recent evaluation for fatigue. No melena, hematochezia, bright red blood per rectum.  Although he has told Jordan brother that he has had some dark stools.  No epistaxis or hemoptysis.  No bruising.  Hemoglobin 06/29/2021 8.9, MCV 87.5, RDW 15.5, platelets 185 Hemoglobin 10.7 07/16/2021 Hemoglobin 9.2, MCV 88.7, RDW 14, platelets 198, ferritin 23 11/04/2021 Urinalysis in February was positive for blood Hemoglobin 05/06/2022 6.7, MCV 87.6, RDW 15.1, platelets 198, ferritin 12.2, iron 31, iron saturation 9, B12 230  He has undergone multiple prior colonoscopies with Dr. Rachelle Hora, including 1991, 1992, 1994, in 1999. Colonoscopy with Dr. Henrene Pastor 2012: 3 polyps, ascending colon AVM, and sigmoid diverticulosis. Two of the polyps were  adenomas.   Normal EGD 2012  Father with stomach cancer and colon cancer who died at age 69.  There is no other known family history of colon cancer or polyps or other GI malignancy. No family history of inflammatory bowel disease or celiac.    Past Medical History:  Diagnosis Date   AAA (abdominal aortic aneurysm) ([place])    Anemia    Aortic stenosis    Arthritis 09/18/11   "in my knees"   Basal cell carcinoma of skin    Blood transfusion    BPH (benign prostatic hyperplasia)    CAD (coronary artery disease)    Remote stent to LAD in 1999. Last cath in 2004 and he is managed medically   Colon polyps    Complication of anesthesia    Dysrhythmia    paced   GERD (gastroesophageal reflux disease)    Hemorrhoid    History of radiation therapy 05/23/20-06/19/20   Radiation to scalp and RUE , Dr. 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   26 mm Edwards Sapien 3 transcatheter heart valve placed via open left transfemoral approach    Past Surgical History:  Procedure Laterality Date   CARDIAC CATHETERIZATION  2004   Managed medically   CARDIAC CATHETERIZATION     CARDIAC CATHETERIZATION N/A 04/11/2015   Procedure: Right/Left Heart Cath and Coronary Angiography;  Surgeon: Burnell Blanks, MD;  Location: [facility];  Service: Cardiovascular;  Laterality: N/A;   CORONARY ANGIOGRAM  09/19/2011   Procedure: CORONARY ANGIOGRAM;  Surgeon: Josue Hector, MD;  Location: [facility];  Service: Cardiovascular;;   CORONARY ANGIOPLASTY WITH STENT PLACEMENT  05/09/1998   "1"; LAD   INSERT / REPLACE / REMOVE PACEMAKER  2004   initial placement; [employer]tr[employer]   INSERT / REPLACE / REMOVE PACEMAKER  02/14/2011   NASAL HEMORRHAGE CONTROL  1980's   Clips placed to stop bleeding   PPM GENERATOR CHANGEOUT N/A 02/01/2021    Procedure: Jud;  Surgeon: Evans Lance, MD;  Location: [facility];  Service: Cardiovascular;  Laterality: N/A;   SKIN CANCER EXCISION  09/18/11   "have had a couple hundred of them removed since I was in my 69's"   TEE WITHOUT CARDIOVERSION N/A 05/15/2015   Procedure: TRANSESOPHAGEAL ECHOCARDIOGRAM (TEE);  Surgeon: Burnell Blanks, MD;  Location: [facility];  Service: Open Heart Surgery;  Laterality: N/A;   TONSILLECTOMY     "when I was a teenager"   TRANSCATHETER AORTIC VALVE REPLACEMENT, TRANSFEMORAL N/A 05/15/2015   Procedure: TRANSCATHETER AORTIC VALVE REPLACEMENT, TRANSFEMORAL;  Surgeon: Burnell Blanks, MD;  Location: [facility];  Service: Open Heart Surgery;  Laterality: N/A;    Current Outpatient Medications  Medication Sig Dispense Refill   acetaminophen (TYLENOL) 325 MG tablet Take 650 mg by mouth every 6 (six) hours as needed for mild pain, moderate pain or fever.      amLODipine (NORVASC) 5 MG tablet TAKE 1 TABLET BY MOUTH EVERY DAY. 90 tablet 2   atorvastatin (LIPITOR) 10 MG tablet Take  1 tablet (10 mg total) by mouth every evening. 90 tablet 3   clopidogrel (PLAVIX) 75 MG tablet Take 1 tablet (75 mg total) by mouth daily. 90 tablet 3   cyanocobalamin (VITAMIN B12) 1000 MCG tablet Take 1,000 mcg by mouth daily.     finasteride (PROSCAR) 5 MG tablet Take 5 mg by mouth every evening.     pantoprazole (PROTONIX) 40 MG tablet Take 40 mg by mouth daily.     silodosin (RAPAFLO) 4 MG CAPS capsule Take 4 mg by mouth daily.     torsemide (DEMADEX) 20 MG tablet Take 1 tablet (20 mg total) by mouth daily. 90 tablet 3   No current facility-administered medications for this visit.    Allergies as of 05/21/2022 - Review Complete 05/16/2022  Allergen Reaction Noted   Aspirin Other (See Comments) 06/21/2008   Tape Other (See Comments) 06/22/2021   Nitroglycerin Other (See Comments) 05/10/2015    Family History  Problem Relation Age of Onset   Cancer  Father        stomach   Colon cancer Father    Heart disease Brother    Heart disease Brother    Diabetes Other        granddaughter    Social History   Socioeconomic History   Marital status: Widowed    Spouse name: Not on file   Number of children: 2   Years of education: Not on file   Highest education level: Not on file  Occupational History   Occupation: Retired    Comment: welder  Tobacco Use   Smoking status: Former    Packs/day: 1.00    Years: 42.00    Total pack years: 42.00    Types: Cigarettes    Quit date: 08/18/1974    Years since quitting: 47.7   Smokeless tobacco: Former    Types: Nurse, children's Use: Never used  Substance and Sexual Activity   Alcohol use: No    Alcohol/week: 0.0 standard drinks of alcohol   Drug use: No   Sexual activity: Not Currently  Other Topics Concern   Not on file  Social History Narrative   Not on file   Social Determinants of Health   Financial Resource Strain: Not on file  Food Insecurity: Not on file  Transportation Needs: Not on file  Physical Activity: Not on file  Stress: Not on file  Social Connections: Not on file  Intimate Partner Violence: Not on file    Review of Systems: 12 system ROS is negative except as noted above with the addition of anxiety, vision changes, confusion, depression, and fatigue.   Physical Exam: General:   Alert,  well-nourished, pleasant and cooperative in NAD Head:  Normocephalic and atraumatic. Eyes:  Sclera clear, no icterus.   Conjunctiva pink. Ears:  Normal auditory acuity. Nose:  No deformity, discharge,  or lesions. Mouth:  No deformity or lesions.   Neck:  Supple; no masses or thyromegaly. Lungs:  Clear throughout to auscultation.   No wheezes. Heart:  Regular rate and rhythm; no murmurs. Abdomen:  Soft, nontender, nondistended, normal bowel sounds, no rebound or guarding. No hepatosplenomegaly.   Rectal:  Deferred  Msk:  Symmetrical. No boney  deformities LAD: No inguinal or umbilical LAD Extremities:  No clubbing or edema. Neurologic:  Alert and  oriented x4;  grossly nonfocal Skin:  Intact without significant lesions or rashes. Psych:  Alert and cooperative. Normal mood and affect.  Isabele Lollar L. Tarri Glenn, MD, MPH 05/21/2022, 8:28 AM

## 2022-05-28 NOTE — Progress Notes (Signed)
Remote pacemaker transmission.   

## 2022-07-14 ENCOUNTER — Encounter: Payer: Self-pay | Admitting: Internal Medicine

## 2022-07-14 ENCOUNTER — Ambulatory Visit: Payer: Medicare Other | Attending: Internal Medicine | Admitting: Internal Medicine

## 2022-07-14 VITALS — BP 120/66 | HR 80 | Ht 62.0 in | Wt 139.0 lb

## 2022-07-14 DIAGNOSIS — I10 Essential (primary) hypertension: Secondary | ICD-10-CM | POA: Diagnosis not present

## 2022-07-14 DIAGNOSIS — Z950 Presence of cardiac pacemaker: Secondary | ICD-10-CM | POA: Diagnosis not present

## 2022-07-14 DIAGNOSIS — I442 Atrioventricular block, complete: Secondary | ICD-10-CM | POA: Diagnosis not present

## 2022-07-14 NOTE — Patient Instructions (Signed)
Medication Instructions:  Your physician recommends that you continue on your current medications as directed. Please refer to the Current Medication list given to you today.  *If you need a refill on your cardiac medications before your next appointment, please call your pharmacy*  Lab Work: None ordered.  If you have labs (blood work) drawn today and your tests are completely normal, you will receive your results only by: [place] (if you have MyChart) OR A paper copy in the mail If you have any lab test that is abnormal or we need to change your treatment, we will call you to review the results.  Testing/Procedures: None ordered.  Follow-Up: At [facility], you and your health needs are our priority.  As part of our continuing mission to provide you with exceptional heart care, we have created designated Provider Care Teams.  These Care Teams include your primary Cardiologist (physician) and Advanced Practice Providers (APPs -  Physician Assistants and Nurse Practitioners) who all work together to provide you with the care you need, when you need it.  We recommend signing up for the patient portal called "MyChart".  Sign up information is provided on this After Visit Summary.  MyChart is used to connect with patients for Virtual Visits (Telemedicine).  Patients are able to view lab/test results, encounter notes, upcoming appointments, etc.  Non-urgent messages can be sent to your provider as well.   To learn more about what you can do with MyChart, go to [email or web address].    Your next appointment:   1 year(s)  The format for your next appointment:   In Person  Provider:   Cristopher Peru, MD{or one of the following Advanced Practice Providers on your designated Care Team:   Tommye Standard, [place] Legrand Como "Jonni Sanger" Chalmers Cater, [place]  Remote monitoring is used to monitor your Pacemaker from home. This monitoring reduces the number of office visits required to check your device to  one time per year. It allows [place] to keep an eye on the functioning of your device to ensure it is working properly. You are scheduled for a device check from home on 08/01/22. You may send your transmission at any time that day. If you have a wireless device, the transmission will be sent automatically. After your physician reviews your transmission, you will receive a postcard with your next transmission date.  Important Information About Sugar

## 2022-07-14 NOTE — Progress Notes (Signed)
HPI [name] returns today for followup. He is a pleasant 88 yo man with aortic stenosis, s/p TAVR, CHB, s/p PPM, dyslipidemia and HTN. Despite his advanced age, he has not been in the hospital. He has dyspnea with exertion but is fairly sedentary. He has not had syncope. No anginal symptoms. 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 He has decided not to have treatment.  Allergies  Allergen Reactions   Aspirin Other (See Comments)    Results in stomach bleeds   Tape Other (See Comments)    SKIN IS THIN AND WILL TEAR EASILY!!   Nitroglycerin Other (See Comments)    Makes the patient faint     Current Outpatient Medications  Medication Sig Dispense Refill   acetaminophen (TYLENOL) 325 MG tablet Take 650 mg by mouth every 6 (six) hours as needed for mild pain, moderate pain or fever.      amLODipine (NORVASC) 5 MG tablet Take 1 tablet by mouth daily.     atorvastatin (LIPITOR) 10 MG tablet Take 1 tablet (10 mg total) by mouth every evening. 90 tablet 3   clopidogrel (PLAVIX) 75 MG tablet Take 1 tablet by mouth daily.     cyanocobalamin (VITAMIN B12) 1000 MCG tablet Take 1,000 mcg by mouth daily.     finasteride (PROSCAR) 5 MG tablet Take 1 tablet by mouth daily.     pantoprazole (PROTONIX) 40 MG tablet Take 1 tablet (40 mg total) by mouth 2 (two) times daily. 60 tablet 5   silodosin (RAPAFLO) 4 MG CAPS capsule Take 1 tablet by mouth daily.     torsemide (DEMADEX) 20 MG tablet Take 1 tablet (20 mg total) by mouth daily. 90 tablet 3   No current facility-administered medications for this visit.     Past Medical History:  Diagnosis Date   AAA (abdominal aortic aneurysm) ([place])    Anemia    Aortic stenosis    Arthritis 09/18/2011   "in my knees"   Basal cell carcinoma of skin    Blood transfusion    BPH (benign prostatic hyperplasia)    CAD (coronary artery disease)    Remote stent to LAD in 1999. Last cath in 2004 and he is managed medically   Colon polyps     Complication of anesthesia    Depression    Dysrhythmia    paced   GERD (gastroesophageal reflux disease)    Hemorrhoid    History of radiation therapy 05/23/20-06/19/20   Radiation to scalp and RUE , Dr. 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   Past Surgical History:  Procedure Laterality Date   BYPASS GRAFT ANGIOGRAPHY     CARDIAC CATHETERIZATION  09/15/2002   Managed medically   CARDIAC CATHETERIZATION     CARDIAC CATHETERIZATION N/A 04/11/2015   Procedure: Right/Left Heart Cath and Coronary Angiography;  Surgeon: Burnell Blanks, MD;  Location: [facility];  Service: Cardiovascular;  Laterality: N/A;   CORONARY ANGIOGRAM  09/19/2011   Procedure: CORONARY ANGIOGRAM;  Surgeon: Josue Hector, MD;  Location:  [facility];  Service: Cardiovascular;;   CORONARY ANGIOPLASTY WITH STENT PLACEMENT  05/09/1998   "1"; LAD   INSERT / REPLACE / REMOVE PACEMAKER  09/15/2002   initial placement; [employer]   INSERT / REPLACE / REMOVE PACEMAKER  02/14/2011   NASAL HEMORRHAGE CONTROL  05/17/1979   Clips placed to stop bleeding   PPM GENERATOR CHANGEOUT N/A 02/01/2021   Procedure: PPM GENERATOR CHANGEOUT;  Surgeon: Evans Lance, MD;  Location: [facility];  Service: Cardiovascular;  Laterality: N/A;   SKIN CANCER EXCISION  09/18/2011   "have had a couple hundred of them removed since I was in my 40's"   TEE WITHOUT CARDIOVERSION N/A 05/15/2015   Procedure: TRANSESOPHAGEAL ECHOCARDIOGRAM (TEE);  Surgeon: Burnell Blanks, MD;  Location: [facility];  Service: Open Heart Surgery;  Laterality: N/A;   TONSILLECTOMY     "when I was a  teenager"   TRANSCATHETER AORTIC VALVE REPLACEMENT, TRANSFEMORAL N/A 05/15/2015   Procedure: TRANSCATHETER AORTIC VALVE REPLACEMENT, TRANSFEMORAL;  Surgeon: Burnell Blanks, MD;  Location: [facility];  Service: Open Heart Surgery;  Laterality: N/A;     Family History  Problem Relation Age of Onset   Cancer Father        stomach   Colon cancer Father    Heart disease Brother    Heart disease Brother    Diabetes Other        granddaughter     Social History   Socioeconomic History   Marital status: Widowed    Spouse name: Not on file   Number of children: 2   Years of education: Not on file   Highest education level: Not on file  Occupational History   Occupation: Retired    Comment: welder  Tobacco Use   Smoking status: Former    Packs/day: 1.00    Years: 42.00    Total pack years: 42.00    Types: Cigarettes    Quit date: 08/18/1974    Years since quitting: 47.9   Smokeless tobacco: Former    Types: Nurse, children's Use: Never used  Substance and Sexual Activity   Alcohol use: No    Alcohol/week: 0.0 standard drinks of alcohol   Drug use: No   Sexual activity: Not Currently  Other Topics Concern   Not on file  Social History Narrative   Not on file   Social Determinants of Health   Financial Resource Strain: Not on file  Food Insecurity: Not on file  Transportation Needs: Not on file  Physical Activity: Not on file  Stress: Not on file  Social Connections: Not on file  Intimate Partner Violence: Not on file     BP 120/66   Pulse 80   Ht '5\' 2"'$  (1.575 m)   Wt 139 lb (63 kg)   BMI 25.42 kg/m   Physical Exam:  Well appearing NAD HEENT: Unremarkable Neck:  No JVD, no thyromegally Lymphatics:  No adenopathy Back:  No CVA tenderness Lungs:  Clear with no wheezes HEART:  Regular rate rhythm, no murmurs, no rubs, no clicks Abd:  soft, positive bowel sounds, no organomegally, no rebound, no guarding Ext:  2 plus pulses, no edema, no  cyanosis, no clubbing Skin:  No rashes no nodules Neuro:  CN II through XII intact, motor grossly intact  EKG  DEVICE  Normal device function.  See PaceArt for details.   Assess/Plan:  1. CHB - he is asymptomatic, s/p PPM insertion 2.  AS - he is doing well, s/p TAVR. 3. HTN - his bp is well controlled. We will follow. If he develops worsening peripheral edema, would stop amlodipine.  4. PPM -he is s/p PM gen change out and his incision appears well healed. We will recheck in several months.   Carleene Overlie Airyanna Dipalma,MD

## 2022-07-16 ENCOUNTER — Other Ambulatory Visit: Payer: Self-pay

## 2022-07-16 ENCOUNTER — Encounter (HOSPITAL_COMMUNITY): Payer: Self-pay

## 2022-07-16 ENCOUNTER — Emergency Department (HOSPITAL_COMMUNITY)
Admission: EM | Admit: 2022-07-16 | Discharge: 2022-07-17 | Disposition: A | Discharge status: Home / Self Care | Payer: Medicare Other | Attending: Emergency Medicine | Admitting: Emergency Medicine

## 2022-07-16 DIAGNOSIS — K5900 Constipation, unspecified: Secondary | ICD-10-CM | POA: Diagnosis not present

## 2022-07-16 DIAGNOSIS — R339 Retention of urine, unspecified: Secondary | ICD-10-CM | POA: Diagnosis present

## 2022-07-16 LAB — CUP PACEART INCLINIC DEVICE CHECK
Battery Remaining Longevity: 111 mo
Battery Voltage: 3 V
Brady Statistic AP VP Percent: 17.38 %
Brady Statistic AP VS Percent: 0.01 %
Brady Statistic AS VP Percent: 81.9 %
Brady Statistic AS VS Percent: 0.7 %
Brady Statistic RA Percent Paced: 17.4 %
Brady Statistic RV Percent Paced: 99.29 %
Date Time Interrogation Session: 20231030143900
Implantable Lead Connection Status: 753985
Implantable Lead Connection Status: 753985
Implantable Lead Implant Date: 20040811
Implantable Lead Implant Date: 20040811
Implantable Lead Location: 753859
Implantable Lead Location: 753860
Implantable Lead Model: 4469
Implantable Lead Model: 4470
Implantable Lead Serial Number: 421267
Implantable Lead Serial Number: 436022
Implantable Pulse Generator Implant Date: 20220520
Lead Channel Impedance Value: 285 Ohm
Lead Channel Impedance Value: 304 Ohm
Lead Channel Impedance Value: 361 Ohm
Lead Channel Impedance Value: 399 Ohm
Lead Channel Pacing Threshold Amplitude: 0.75 V
Lead Channel Pacing Threshold Amplitude: 1.125 V
Lead Channel Pacing Threshold Pulse Width: 0.4 ms
Lead Channel Pacing Threshold Pulse Width: 0.4 ms
Lead Channel Sensing Intrinsic Amplitude: 1.125 mV
Lead Channel Sensing Intrinsic Amplitude: 1.5 mV
Lead Channel Sensing Intrinsic Amplitude: 2.25 mV
Lead Channel Sensing Intrinsic Amplitude: 3.625 mV
Lead Channel Setting Pacing Amplitude: 1.5 V
Lead Channel Setting Pacing Amplitude: 2.5 V
Lead Channel Setting Pacing Pulse Width: 0.4 ms
Lead Channel Setting Sensing Sensitivity: 0.9 mV
Zone Setting Status: 755011
Zone Setting Status: 755011

## 2022-07-16 LAB — BASIC METABOLIC PANEL
Anion gap: 11 (ref 5–15)
BUN: 32 mg/dL — ABNORMAL HIGH (ref 8–23)
CO2: 27 mmol/L (ref 22–32)
Calcium: 8.7 mg/dL — ABNORMAL LOW (ref 8.9–10.3)
Chloride: 97 mmol/L — ABNORMAL LOW (ref 98–111)
Creatinine, Ser: 1.55 mg/dL — ABNORMAL HIGH (ref 0.61–1.24)
GFR, Estimated: 39 mL/min — ABNORMAL LOW (ref >60)
Glucose, Bld: 112 mg/dL — ABNORMAL HIGH (ref 70–99)
Potassium: 3.4 mmol/L — ABNORMAL LOW (ref 3.5–5.1)
Sodium: 135 mmol/L (ref 135–145)

## 2022-07-16 LAB — URINALYSIS, ROUTINE W REFLEX MICROSCOPIC
Bilirubin Urine: NEGATIVE
Glucose, UA: NEGATIVE mg/dL
Hgb urine dipstick: NEGATIVE
Ketones, ur: NEGATIVE mg/dL
Leukocytes,Ua: NEGATIVE
Nitrite: NEGATIVE
Protein, ur: NEGATIVE mg/dL
Specific Gravity, Urine: 1.009 (ref 1.005–1.030)
pH: 6 (ref 5.0–8.0)

## 2022-07-16 LAB — CBC WITH DIFFERENTIAL/PLATELET
Abs Immature Granulocytes: 0.02 10*3/uL (ref 0.00–0.07)
Basophils Absolute: 0.1 10*3/uL (ref 0.0–0.1)
Basophils Relative: 1 %
Eosinophils Absolute: 0.1 10*3/uL (ref 0.0–0.5)
Eosinophils Relative: 2 %
HCT: 32.3 % — ABNORMAL LOW (ref 39.0–52.0)
Hemoglobin: 10.1 g/dL — ABNORMAL LOW (ref 13.0–17.0)
Immature Granulocytes: 0 %
Lymphocytes Relative: 16 %
Lymphs Abs: 1.4 10*3/uL (ref 0.7–4.0)
MCH: 29.5 pg (ref 26.0–34.0)
MCHC: 31.3 g/dL (ref 30.0–36.0)
MCV: 94.4 fL (ref 80.0–100.0)
Monocytes Absolute: 0.6 10*3/uL (ref 0.1–1.0)
Monocytes Relative: 7 %
Neutro Abs: 6.3 10*3/uL (ref 1.7–7.7)
Neutrophils Relative %: 74 %
Platelets: 178 10*3/uL (ref 150–400)
RBC: 3.42 MIL/uL — ABNORMAL LOW (ref 4.22–5.81)
RDW: 15.5 % (ref 11.5–15.5)
WBC: 8.6 10*3/uL (ref 4.0–10.5)
nRBC: 0 % (ref 0.0–0.2)

## 2022-07-16 MED ORDER — LIDOCAINE HCL URETHRAL/MUCOSAL 2 % EX GEL
1.0000 | Freq: Once | CUTANEOUS | Status: AC
Start: 2022-07-16 — End: 2022-07-16
  Administered 2022-07-16: 1 via TOPICAL
  Filled 2022-07-16: qty 11

## 2022-07-16 NOTE — ED Provider Notes (Signed)
  Physical Exam  BP (!) 151/83   Pulse 95   Temp 97.6 F (36.4 C)   Resp 19   SpO2 96%   Physical Exam Constitutional:      Appearance: He is not toxic-appearing.  Cardiovascular:     Rate and Rhythm: Normal rate. Rhythm irregular.  Pulmonary:     Effort: Pulmonary effort is normal. No respiratory distress.  Abdominal:     Palpations: Abdomen is soft.     Tenderness: There is no abdominal tenderness. There is no guarding or rebound.  Skin:    General: Skin is warm and dry.  Neurological:     Mental Status: He is alert. Mental status is at baseline.     Procedures  Procedures  ED Course / MDM    Medical Decision Making  Accepted handoff at shift change from [facility], PA-C. Please see prior provider note for more detail.   Briefly: Patient is 88 y.o. M presents to the ER for evaluation of constipation and urinary retention.   DDX: concern for urinary retention  Plan: Follow up on urinalysis to see if the patient can urinate on his own.   Accept this patient in handoff.  Please see previous providers notes for additional detail.  Patient was able to void on his own with 120 mL of urine.  PVR shows <30.  Urinalysis unremarkable, no signs of infection however urine culture was obtained.  On my evaluation, patient is alert and nonacute appearing.  He is pleasant reports that he feels much better and would like to go home to his recliner.  On my exam, his abdomen is soft without any tenderness or guarding.  He is nontoxic-appearing.  He is in A-fib with some PVCs otherwise has a normal rate.  Skin is warm and dry and at his baseline.  Discussed with the patient that he needs to adhere to his bowel regimen of MiraLAX and to not stop taking it whenever his bowel movements are regular.  Patient feeling members verbalized her understanding.  We discussed strict return precautions and red flag symptoms.  Patient is stable being discharged home in good condition.         Sherrell Puller, PA-C 07/16/22 Arnold, Donald, MD 07/17/22 [phone]

## 2022-07-16 NOTE — Discharge Instructions (Addendum)
You were disimpacted due to constipation at today's visit. Please resume your prescribed MiraLAX and Colace to hopefully avoid this problem in the future.  If you have any concerns, new or worsening symptoms, please return to the nearest emergency room for evaluation.

## 2022-07-16 NOTE — ED Provider Notes (Signed)
[facility] DEPT Provider Note   CSN: [id number] Arrival date & time: 07/16/22  1932     History  Chief Complaint  Patient presents with   Constipation   Urinary Retention    Jordan [name] is a 88 y.o. male.  Patient complains of 4 days of constipation and urinary retention.  Patient states he has only had a few tablespoons of urine throughout the day today.  He also complains of rectal pain.  The patient's son is at bedside with the patient.  He states that this happened before the patient has required both catheters in the past and disimpaction before.  The patient denies abdominal pain, nausea, vomiting, fever, shortness of breath, chest pain.  Past medical history significant for coronary artery disease, BPH, hemorrhoids, AAA, pacemaker  HPI     Home Medications Prior to Admission medications   Medication Sig Start Date End Date Taking? Authorizing Provider  acetaminophen (TYLENOL) 325 MG tablet Take 650 mg by mouth every 6 (six) hours as needed for mild pain, moderate pain or fever.     [provider]  amLODipine (NORVASC) 5 MG tablet Take 1 tablet by mouth daily.    [provider]  atorvastatin (LIPITOR) 10 MG tablet Take 1 tablet (10 mg total) by mouth every evening. 02/24/22   Martinique, Peter M, MD  clopidogrel (PLAVIX) 75 MG tablet Take 1 tablet by mouth daily.    [provider]  cyanocobalamin (VITAMIN B12) 1000 MCG tablet Take 1,000 mcg by mouth daily.    [provider]  finasteride (PROSCAR) 5 MG tablet Take 1 tablet by mouth daily.    [provider]  pantoprazole (PROTONIX) 40 MG tablet Take 1 tablet (40 mg total) by mouth 2 (two) times daily. 05/21/22   Thornton Park, MD  silodosin (RAPAFLO) 4 MG CAPS capsule Take 1 tablet by mouth daily.    [provider]  torsemide (DEMADEX) 20 MG tablet Take 1 tablet (20 mg total) by mouth daily. 02/24/22   Martinique, Peter M, MD       Allergies    Aspirin, Tape, and Nitroglycerin    Review of Systems   Review of Systems  Respiratory:  Negative for shortness of breath.   Cardiovascular:  Negative for chest pain.  Gastrointestinal:  Positive for constipation. Negative for abdominal pain, diarrhea, nausea and vomiting.  Genitourinary:  Positive for difficulty urinating.    Physical Exam Updated Vital Signs BP (!) 158/105   Pulse (!) 45   Temp 97.6 F (36.4 C)   Resp (!) 23   SpO2 95%  Physical Exam Vitals and nursing note reviewed. Exam conducted with a chaperone present.  Constitutional:      General: He is not in acute distress. HENT:     Head: Normocephalic.     Mouth/Throat:     Mouth: Mucous membranes are moist.  Eyes:     Comments: Conjunctiva normal right eye  Cardiovascular:     Rate and Rhythm: Normal rate.  Pulmonary:     Effort: Pulmonary effort is normal.  Abdominal:     Palpations: Abdomen is soft.     Tenderness: There is abdominal tenderness (Mild suprapubic tenderness).  Genitourinary:    Comments: Large amount of impacted stool in rectal vault Musculoskeletal:        General: Normal range of motion.     Cervical back: Normal range of motion.  Skin:    General: Skin is warm and dry.  Capillary Refill: Capillary refill takes less than 2 seconds.  Neurological:     Mental Status: He is alert.     ED Results / Procedures / Treatments   Labs (all labs ordered are listed, but only abnormal results are displayed) Labs Reviewed  BASIC METABOLIC PANEL - Abnormal; Notable for the following components:      Result Value   Potassium 3.4 (*)    Chloride 97 (*)    Glucose, Bld 112 (*)    BUN 32 (*)    Creatinine, Ser 1.55 (*)    Calcium 8.7 (*)    GFR, Estimated 39 (*)    All other components within normal limits  CBC WITH DIFFERENTIAL/PLATELET - Abnormal; Notable for the following components:   RBC 3.42 (*)    Hemoglobin 10.1 (*)    HCT 32.3 (*)    All other components  within normal limits  URINE CULTURE  URINALYSIS, ROUTINE W REFLEX MICROSCOPIC    EKG None  Radiology No results found.  Procedures Fecal disimpaction  Date/Time: 07/16/2022 9:43 PM  Performed by: Dorothyann Peng, PA-C Authorized by: Ronny Bacon  Consent: Verbal consent obtained. Consent given by: patient Patient understanding: patient states understanding of the procedure being performed Patient consent: the patient's understanding of the procedure matches consent given Procedure consent: procedure consent matches procedure scheduled Patient identity confirmed: verbally with patient Local anesthesia used: no  Anesthesia: Local anesthesia used: no  Sedation: Patient sedated: no  Patient tolerance: patient tolerated the procedure well with no immediate complications       Medications Ordered in ED Medications  lidocaine (XYLOCAINE) 2 % jelly 1 Application (1 Application Topical Given 07/16/22 2123)    ED Course/ Medical Decision Making/ A&P                           Medical Decision Making  Patient presents with chief complaint of constipation and urinary retention.  Bladder scan showed approximately 240 mL of urine in the bladder  I reviewed the patient's past medical history including multiple visits for urinary retention  I ordered and reviewed labs.  Pertinent results include creatinine 1.55, potassium 3.4, hemoglobin 10.1.  There is indication at this time for imaging.  I manually disimpacted the patient as noted in procedure note above.  The patient began to have flow of urine immediately after disimpaction was complete  Patient care being transferred to oncoming provider at shift handoff.  Plan to discharge patient home.  If patient continues to have difficulty urination Foley catheter to be placed with planned urology follow-up.  If patient is able to urinate he may discharge home with no catheter.        Final Clinical  Impression(s) / ED Diagnoses Final diagnoses:  Constipation, unspecified constipation type    Rx / DC Orders ED Discharge Orders     None         Ronny Bacon 07/16/22 2157    Regan Lemming, MD 07/16/22 2249

## 2022-07-16 NOTE — ED Provider Triage Note (Signed)
Emergency Medicine Provider Triage Evaluation Note  Jordan [name] , a 88 y.o. male  was evaluated in triage.  Pt complains of urinary retention and constipation. States he has only dribbled a few tbsp of urine today intermittently. No BM in 3 days. States he is having rectal pain. Son at bedside states this incident has happened before. Denies fevers, nausea or vomiting. .  Review of Systems  Positive:  Negative: See above  Physical Exam  BP (!) 142/73 (BP Location: Right Arm)   Pulse (!) 112   Temp 98 F (36.7 C) (Oral)   Resp 18   SpO2 96%  Gen:   Awake, no distress   Resp:  Normal effort  MSK:   Moves extremities without difficulty  Other:  Suprapubic TTP   Medical Decision Making  Medically screening exam initiated at 8:01 PM.  Appropriate orders placed.  Jordan [name] was informed that the remainder of the evaluation will be completed by another provider, this initial triage assessment does not replace that evaluation, and the importance of remaining in the ED until their evaluation is complete.     Mickie Hillier, PA-C 07/16/22 2002

## 2022-07-16 NOTE — ED Triage Notes (Signed)
Pt reports with constipation x 3 days and states that he is having problems urinating. Pt states that he has just been "dribbling" and feels that his bladder is full.

## 2022-07-18 LAB — URINE CULTURE: Culture: NO GROWTH

## 2022-08-01 LAB — CUP PACEART REMOTE DEVICE CHECK
Battery Remaining Longevity: 116 mo
Battery Voltage: 3 V
Brady Statistic AP VP Percent: 30.56 %
Brady Statistic AP VS Percent: 0.09 %
Brady Statistic AS VP Percent: 68.19 %
Brady Statistic AS VS Percent: 1.16 %
Brady Statistic RA Percent Paced: 30.36 %
Brady Statistic RV Percent Paced: 98.75 %
Date Time Interrogation Session: 20231117002002
Implantable Lead Connection Status: 753985
Implantable Lead Connection Status: 753985
Implantable Lead Implant Date: 20040811
Implantable Lead Implant Date: 20040811
Implantable Lead Location: 753859
Implantable Lead Location: 753860
Implantable Lead Model: 4469
Implantable Lead Model: 4470
Implantable Lead Serial Number: 421267
Implantable Lead Serial Number: 436022
Implantable Pulse Generator Implant Date: 20220520
Lead Channel Impedance Value: 285 Ohm
Lead Channel Impedance Value: 304 Ohm
Lead Channel Impedance Value: 361 Ohm
Lead Channel Impedance Value: 418 Ohm
Lead Channel Pacing Threshold Amplitude: 0.625 V
Lead Channel Pacing Threshold Amplitude: 1.125 V
Lead Channel Pacing Threshold Pulse Width: 0.4 ms
Lead Channel Pacing Threshold Pulse Width: 0.4 ms
Lead Channel Sensing Intrinsic Amplitude: 1.75 mV
Lead Channel Sensing Intrinsic Amplitude: 1.75 mV
Lead Channel Sensing Intrinsic Amplitude: 5.125 mV
Lead Channel Sensing Intrinsic Amplitude: 5.125 mV
Lead Channel Setting Pacing Amplitude: 1.5 V
Lead Channel Setting Pacing Amplitude: 2.25 V
Lead Channel Setting Pacing Pulse Width: 0.4 ms
Lead Channel Setting Sensing Sensitivity: 0.9 mV
Zone Setting Status: 755011
Zone Setting Status: 755011

## 2022-08-20 NOTE — Progress Notes (Signed)
Jordan [name] Date of Birth: [birth year]   History of Present Illness: Jordan [name] is seen today for follow up   He has a history of coronary disease with remote stenting of the LAD in 1999. Cardiac catheterization January 2013 showed nonobstructive disease. He has a history of bradycardia requiring pacemaker implant. He has a history of severe aortic stenosis. He underwent TAVR on 05/15/15.  Follow up Echo in September 2017 looked good.   He has a 4.6 cm abdominal aortic aneurysm followed  by [facility]- last checked in October 2019. Stable at 4.7 cm.  Not a candidate for stent grafting.   He was admitted in January 2021 with  a fall. Had post concussion confusion. Son thinks he may have fallen when he got up to urinate at night without the light on. Was orthostatic. Troponin elevated 2562> 2467. Seen by Jordan [name]. No clear evidence of ACS by history. Echo was OK and pacemaker checked out. CT of head without acute change. Felt he had some vagal response related to abdominal pain.  He was seen this spring with increased [name] edema. Doppler was negative for DVT.  Was placed on lasix 20 mg daily and later switched to torsemide 10 mg daily. This was increased to 20 mg daily on his last visit. He is wearing compression hose still.  He has completed RT for scalp cancer.   He underwent pacemaker generator change out on May 22.   He was seen in the ED in June 2022 with urinary retention.   In October he had recurrent urinary retention with hematuria and clots. Had foley placed with continuous irrigation with resolution of hematuria. Foley removed.  He also had symptomatic anemia with Hgb down to 6.2. was transfused. Plavix initially held but was resumed at DC. Hgb at DC 8.9.   Was seen again in November with urinary retention. Had foley placed. Followed by urology. Hgb fairly stable. Had it checked with Jordan [name] last week. Complains of back pain. Sleeps in a recliner. Hurts across his abdomen. Relieved  with passing gas. Some heartburn.    Current Outpatient Medications on File Prior to Visit  Medication Sig Dispense Refill   acetaminophen (TYLENOL) 325 MG tablet Take 650 mg by mouth every 6 (six) hours as needed for mild pain, moderate pain or fever.      amLODipine (NORVASC) 5 MG tablet Take 1 tablet by mouth daily.     atorvastatin (LIPITOR) 10 MG tablet Take 1 tablet (10 mg total) by mouth every evening. 90 tablet 3   clopidogrel (PLAVIX) 75 MG tablet Take 1 tablet by mouth daily.     cyanocobalamin (VITAMIN B12) 1000 MCG tablet Take 1,000 mcg by mouth daily.     finasteride (PROSCAR) 5 MG tablet Take 1 tablet by mouth daily.     pantoprazole (PROTONIX) 40 MG tablet Take 1 tablet (40 mg total) by mouth 2 (two) times daily. 60 tablet 5   silodosin (RAPAFLO) 4 MG CAPS capsule Take 1 tablet by mouth daily.     torsemide (DEMADEX) 20 MG tablet Take 1 tablet (20 mg total) by mouth daily. 90 tablet 3   No current facility-administered medications on file prior to visit.    Allergies  Allergen Reactions   Aspirin Other (See Comments)    Results in stomach bleeds   Tape Other (See Comments)    SKIN IS THIN AND WILL TEAR EASILY!!   Nitroglycerin Other (See Comments)    Makes the  patient faint    Past Medical History:  Diagnosis Date   AAA (abdominal aortic aneurysm) ([place])    Anemia    Aortic stenosis    Arthritis 09/18/2011   "in my knees"   Basal cell carcinoma of skin    Blood transfusion    BPH (benign prostatic hyperplasia)    CAD (coronary artery disease)    Remote stent to LAD in 1999. Last cath in 2004 and he is managed medically   Colon polyps    Complication of anesthesia    Depression    Dysrhythmia    paced   GERD (gastroesophageal reflux disease)    Hemorrhoid    History of radiation therapy 05/23/20-06/19/20   Radiation to scalp and RUE , Jordan. 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   26 mm Edwards Sapien 3 transcatheter heart valve placed via open left transfemoral approach    Past Surgical History:  Procedure Laterality Date   BYPASS GRAFT ANGIOGRAPHY     CARDIAC CATHETERIZATION  09/15/2002   Managed medically   CARDIAC CATHETERIZATION     CARDIAC CATHETERIZATION N/A 04/11/2015   Procedure: Right/Left Heart Cath and Coronary Angiography;  Surgeon: Jordan Blanks, MD;  Location: [facility];  Service: Cardiovascular;  Laterality: N/A;   CORONARY ANGIOGRAM  09/19/2011   Procedure: CORONARY ANGIOGRAM;  Surgeon: Jordan Hector, MD;  Location: [facility] CATH LAB;  Service: Cardiovascular;;   CORONARY ANGIOPLASTY WITH STENT PLACEMENT  05/09/1998   "1"; LAD   INSERT / REPLACE / REMOVE PACEMAKER  09/15/2002   initial placement; [employer]   INSERT / REPLACE / REMOVE PACEMAKER  02/14/2011   NASAL HEMORRHAGE CONTROL  05/17/1979   Clips placed to stop bleeding   PPM GENERATOR CHANGEOUT N/A 02/01/2021   Procedure: PPM GENERATOR CHANGEOUT;  Surgeon: Jordan Lance, MD;  Location: [facility];  Service: Cardiovascular;  Laterality: N/A;   SKIN CANCER EXCISION  09/18/2011   "have had a couple hundred of them removed since I was in my 40's"   TEE WITHOUT CARDIOVERSION N/A 05/15/2015   Procedure: TRANSESOPHAGEAL ECHOCARDIOGRAM (TEE);  Surgeon: Jordan Blanks, MD;  Location: [facility];  Service: Open Heart Surgery;  Laterality: N/A;   TONSILLECTOMY     "when I was a teenager"   TRANSCATHETER AORTIC VALVE REPLACEMENT, TRANSFEMORAL N/A 05/15/2015   Procedure: TRANSCATHETER AORTIC VALVE REPLACEMENT, TRANSFEMORAL;  Surgeon: Jordan Blanks, MD;  Location: [facility];  Service: Open Heart Surgery;  Laterality: N/A;    Social History   Tobacco Use  Smoking Status Former   Packs/day: 1.00   Years: 42.00   Total pack years: 42.00   Types: Cigarettes   Quit  date: 08/18/1974   Years since quitting: 48.0  Smokeless Tobacco Former   Types: Chew    Social History   Substance and Sexual Activity  Alcohol Use No   Alcohol/week: 0.0 standard drinks of alcohol    Family History  Problem Relation Age of Onset   Cancer Father        stomach   Colon cancer Father    Heart disease Brother    Heart disease Brother    Diabetes Other        granddaughter    Review  of Systems: As noted in history of present illness. All other systems were reviewed and are negative.  Physical Exam: BP (!) 98/58   Pulse 61   Ht '5\' 2"'$  (1.575 m)   SpO2 95%   BMI 25.42 kg/m  GENERAL:  elderly WM seen in wheelchair.  HEENT:   Oropharynx is clear. NECK:  No jugular venous distention, carotid upstroke brisk and symmetric, no bruits, no thyromegaly or adenopathy LUNGS:  clear CHEST:  Unremarkable HEART:  RRR,  PMI not displaced or sustained,S1 and S2 within normal limits, no S3, no S4: no clicks, no rubs, gr 2/6 systolic murmur. ABD:  Soft, nontender. BS +, no masses or bruits. No hepatomegaly, no splenomegaly EXT:  Pedal pulses are palpable, no pedal edema. N SKIN:  Warm and dry.  No rashes NEURO:  Alert and oriented x 3. Cranial nerves II through XII intact. PSYCH:  Cognitively intact    LABORATORY DATA: Lab Results  Component Value Date   WBC 8.6 07/16/2022   HGB 10.1 (L) 07/16/2022   HCT 32.3 (L) 07/16/2022   PLT 178 07/16/2022   GLUCOSE 112 (H) 07/16/2022   CHOL 140 11/02/2020   TRIG 211 (H) 11/02/2020   HDL 36 (L) 11/02/2020   LDLCALC 69 11/02/2020   ALT 10 06/23/2021   AST 17 06/23/2021   NA 135 07/16/2022   K 3.4 (L) 07/16/2022   CL 97 (L) 07/16/2022   CREATININE 1.55 (H) 07/16/2022   BUN 32 (H) 07/16/2022   CO2 27 07/16/2022   TSH 0.797 09/18/2011   INR 1.1 06/22/2021   HGBA1C 5.5 05/11/2015   Labs dated 07/05/18: Normal CBC and CMET. Cholesterol 120, triglycerides 131, HDL 36, LDL 58.  Dated 07/16/21: cholesterol 126,  triglycerides 58, HDL 42, LDL 72. Normal LFTs   Ecg not done today    Echo: 05/28/16:  Study Conclusions   - Left ventricle: The cavity size was mildly dilated. Wall   thickness was increased in a pattern of moderate LVH. Systolic   function was normal. The estimated ejection fraction was in the   range of 60% to 65%. Doppler parameters are consistent with   abnormal left ventricular relaxation (grade 1 diastolic   dysfunction). - Aortic valve: 26 mm Sapien 3 valve in excellent position with no   perivalvular regurgitation and stable gradients. - Mitral valve: Calcified annulus. Moderately thickened leaflets .   There was mild regurgitation. Valve area by pressure half-time:   2.5 cm^2. - Left atrium: The atrium was moderately dilated. - Atrial septum: No defect or patent foramen ovale was identified.   Echo 09/25/19:IMPRESSIONS     1. Left ventricular ejection fraction, by visual estimation, is 60 to  65%. The left ventricle has normal function. There is mildly increased  left ventricular hypertrophy.   2. Elevated left atrial pressure.   3. Left ventricular diastolic parameters are consistent with Grade I  diastolic dysfunction (impaired relaxation).   4. The left ventricle has no regional wall motion abnormalities.   5. Global right ventricle has normal systolic function.The right  ventricular size is normal.   6. Left atrial size was mildly dilated.   7. Right atrial size was normal.   8. Severe mitral annular calcification.   9. The mitral valve is normal in structure. Mild mitral valve  regurgitation. No evidence of mitral stenosis.  10. The tricuspid valve is normal in structure.  11. The aortic valve is normal in structure. Aortic valve regurgitation is  not visualized.  No evidence of aortic valve sclerosis or stenosis.  12. The pulmonic valve was normal in structure. Pulmonic valve  regurgitation is not visualized.  13. The inferior vena cava is normal in size  with greater than 50%  respiratory variability, suggesting right atrial pressure of 3 mmHg.  14. Normal LV systolic function; mild LVH; grade 1 diastolic dysfunction;  s/p TAVR with mean gradient 10 mmHg; AVA 1.8 cm2) and no AI; mild LAE;  mild MR.   Assessment / Plan: 1. Coronary disease with remote stenting of the LAD in 1999. Cardiac catheterization Jan 2013 showed nonobstructive disease. He is asymptomatic. We will continue with Plavix and statin therapy.   2. Severe aortic stenosis.s/p TAVR in August 2016. Excellent result. Follow up Echo in January 2021 looked good. Exam is stable.    3. Sinus node dysfunction with bradycardia. Status post pacemaker implant. Pacemaker followup is satisfactory.   4. Hyperlipidemia. On chronic Crestor therapy. LDL 72.  5. AAA 4.7 cm. Followed by [facility]. Not a candidate for stent grafting. He is a poor candidate for open repair. Last [place] October 2019 was stable. Since he is not a candidate for repair we are not really following at this point.   6. Renal mass. Followed by urology.   7. Urinary retention ow with foley in place  9. Anemia followed by PCP  I will follow up in 6 months.

## 2022-08-25 ENCOUNTER — Ambulatory Visit: Payer: Medicare Other | Attending: Cardiology | Admitting: Cardiology

## 2022-08-25 ENCOUNTER — Encounter: Payer: Self-pay | Admitting: Cardiology

## 2022-08-25 VITALS — BP 98/58 | HR 61 | Ht 62.0 in

## 2022-08-25 DIAGNOSIS — I442 Atrioventricular block, complete: Secondary | ICD-10-CM | POA: Diagnosis not present

## 2022-08-25 DIAGNOSIS — E785 Hyperlipidemia, unspecified: Secondary | ICD-10-CM

## 2022-08-25 DIAGNOSIS — Z953 Presence of xenogenic heart valve: Secondary | ICD-10-CM | POA: Diagnosis not present

## 2022-08-25 DIAGNOSIS — Z950 Presence of cardiac pacemaker: Secondary | ICD-10-CM | POA: Diagnosis not present

## 2022-08-25 DIAGNOSIS — I2510 Atherosclerotic heart disease of native coronary artery without angina pectoris: Secondary | ICD-10-CM | POA: Diagnosis not present

## 2022-09-16 ENCOUNTER — Other Ambulatory Visit: Payer: Self-pay | Admitting: Cardiology

## 2022-10-25 ENCOUNTER — Other Ambulatory Visit: Payer: Self-pay

## 2022-10-25 ENCOUNTER — Emergency Department (HOSPITAL_COMMUNITY)
Admission: EM | Admit: 2022-10-25 | Discharge: 2022-10-25 | Disposition: A | Discharge status: Home / Self Care | Payer: Medicare Other | Attending: Emergency Medicine | Admitting: Emergency Medicine

## 2022-10-25 ENCOUNTER — Emergency Department (HOSPITAL_COMMUNITY): Payer: Medicare Other

## 2022-10-25 DIAGNOSIS — J168 Pneumonia due to other specified infectious organisms: Secondary | ICD-10-CM | POA: Insufficient documentation

## 2022-10-25 DIAGNOSIS — I2510 Atherosclerotic heart disease of native coronary artery without angina pectoris: Secondary | ICD-10-CM | POA: Diagnosis not present

## 2022-10-25 DIAGNOSIS — R7989 Other specified abnormal findings of blood chemistry: Secondary | ICD-10-CM | POA: Diagnosis not present

## 2022-10-25 DIAGNOSIS — Z7902 Long term (current) use of antithrombotics/antiplatelets: Secondary | ICD-10-CM | POA: Insufficient documentation

## 2022-10-25 DIAGNOSIS — Z20822 Contact with and (suspected) exposure to covid-19: Secondary | ICD-10-CM | POA: Insufficient documentation

## 2022-10-25 DIAGNOSIS — J189 Pneumonia, unspecified organism: Secondary | ICD-10-CM

## 2022-10-25 DIAGNOSIS — Z950 Presence of cardiac pacemaker: Secondary | ICD-10-CM | POA: Insufficient documentation

## 2022-10-25 DIAGNOSIS — R0602 Shortness of breath: Secondary | ICD-10-CM | POA: Diagnosis present

## 2022-10-25 LAB — COMPREHENSIVE METABOLIC PANEL
ALT: 9 U/L (ref 0–44)
AST: 17 U/L (ref 15–41)
Albumin: 2.6 g/dL — ABNORMAL LOW (ref 3.5–5.0)
Alkaline Phosphatase: 66 U/L (ref 38–126)
Anion gap: 12 (ref 5–15)
BUN: 27 mg/dL — ABNORMAL HIGH (ref 8–23)
CO2: 28 mmol/L (ref 22–32)
Calcium: 8.9 mg/dL (ref 8.9–10.3)
Chloride: 98 mmol/L (ref 98–111)
Creatinine, Ser: 1.38 mg/dL — ABNORMAL HIGH (ref 0.61–1.24)
GFR, Estimated: 45 mL/min — ABNORMAL LOW (ref >60)
Glucose, Bld: 157 mg/dL — ABNORMAL HIGH (ref 70–99)
Potassium: 3.3 mmol/L — ABNORMAL LOW (ref 3.5–5.1)
Sodium: 138 mmol/L (ref 135–145)
Total Bilirubin: 0.3 mg/dL (ref 0.3–1.2)
Total Protein: 7 g/dL (ref 6.5–8.1)

## 2022-10-25 LAB — CBC WITH DIFFERENTIAL/PLATELET
Abs Immature Granulocytes: 0.06 10*3/uL (ref 0.00–0.07)
Basophils Absolute: 0 10*3/uL (ref 0.0–0.1)
Basophils Relative: 0 %
Eosinophils Absolute: 0 10*3/uL (ref 0.0–0.5)
Eosinophils Relative: 0 %
HCT: 28.8 % — ABNORMAL LOW (ref 39.0–52.0)
Hemoglobin: 8.7 g/dL — ABNORMAL LOW (ref 13.0–17.0)
Immature Granulocytes: 1 %
Lymphocytes Relative: 10 %
Lymphs Abs: 1 10*3/uL (ref 0.7–4.0)
MCH: 28.2 pg (ref 26.0–34.0)
MCHC: 30.2 g/dL (ref 30.0–36.0)
MCV: 93.5 fL (ref 80.0–100.0)
Monocytes Absolute: 0.7 10*3/uL (ref 0.1–1.0)
Monocytes Relative: 7 %
Neutro Abs: 7.9 10*3/uL — ABNORMAL HIGH (ref 1.7–7.7)
Neutrophils Relative %: 82 %
Platelets: 252 10*3/uL (ref 150–400)
RBC: 3.08 MIL/uL — ABNORMAL LOW (ref 4.22–5.81)
RDW: 14.9 % (ref 11.5–15.5)
WBC: 9.6 10*3/uL (ref 4.0–10.5)
nRBC: 0 % (ref 0.0–0.2)

## 2022-10-25 LAB — RESP PANEL BY RT-PCR (RSV, FLU A&B, COVID)  RVPGX2
Influenza A by PCR: NEGATIVE
Influenza B by PCR: NEGATIVE
Resp Syncytial Virus by PCR: NEGATIVE
SARS Coronavirus 2 by RT PCR: NEGATIVE

## 2022-10-25 LAB — TROPONIN I (HIGH SENSITIVITY)
Troponin I (High Sensitivity): 39 ng/L — ABNORMAL HIGH (ref <18)
Troponin I (High Sensitivity): 40 ng/L — ABNORMAL HIGH (ref <18)

## 2022-10-25 LAB — BRAIN NATRIURETIC PEPTIDE: B Natriuretic Peptide: 326.2 pg/mL — ABNORMAL HIGH (ref 0.0–100.0)

## 2022-10-25 MED ORDER — AZITHROMYCIN 250 MG PO TABS: 250.0000 mg | ORAL_TABLET | Freq: Every day | ORAL | 0 refills | Status: DC

## 2022-10-25 MED ORDER — AZITHROMYCIN 250 MG PO TABS
500.0000 mg | ORAL_TABLET | Freq: Every day | ORAL | Status: DC
  Administered 2022-10-25: 500 mg via ORAL
  Filled 2022-10-25: qty 2

## 2022-10-25 MED ORDER — AMOXICILLIN-POT CLAVULANATE 875-125 MG PO TABS
1.0000 | ORAL_TABLET | Freq: Once | ORAL | Status: AC
  Administered 2022-10-25: 1 via ORAL
  Filled 2022-10-25: qty 1

## 2022-10-25 MED ORDER — AMOXICILLIN-POT CLAVULANATE 875-125 MG PO TABS: 1.0000 | ORAL_TABLET | Freq: Two times a day (BID) | ORAL | 0 refills | Status: DC

## 2022-10-25 MED ORDER — SODIUM CHLORIDE 0.9 % IV BOLUS: 500.0000 mL | Freq: Once | INTRAVENOUS | Status: DC

## 2022-10-25 NOTE — ED Triage Notes (Signed)
Patient bib [facility] from home with complaints of sob. Denies fever and cough. VSS. Patient recently had driving privileges taken. He is Jordan [name]

## 2022-10-25 NOTE — Discharge Instructions (Signed)
Please make sure you are drinking lots of fluids, and resting at home, take antibiotics as prescribed, if you develop fevers, worsening shortness of breath, confusion please return to the ER.  You will start your Augmentin tomorrow morning, she received 1 dose today, and take your azithromycin as well tomorrow.

## 2022-10-25 NOTE — ED Provider Notes (Signed)
Shared PA visit.  Well-appearing 89 year old here with shortness of breath.  History of CAD, pacemaker.  EKG shows paced rhythm.  He is not having any chest pain.  He has no fever.  No leukocytosis.  Questionable pneumonia on chest x-ray.  COVID and flu test are negative.  He was having some shortness of breath today.  Troponin is 39.  Will trend.  If troponin is flat anticipate discharge with treatment for possible infection.  Overall he appears well and no concern for sepsis.  This chart was dictated using voice recognition software.  Despite best efforts to proofread,  errors can occur which can change the documentation meaning.    Lennice Sites, DO 10/25/22 1337

## 2022-10-25 NOTE — ED Notes (Signed)
IV was taken, pt was given discharge paperwork from RN. Family is with pt

## 2022-10-25 NOTE — ED Provider Notes (Signed)
[facility] Provider Note   CSN: [id number] Arrival date & time: 10/25/22  1106     History  Chief Complaint  Patient presents with   Shortness of Breath    Jordan [name] is a 89 y.o. male, hx of complete heart block (w/pacemaker in place), CAD, who presents to the ED 2/2 to shortness of breath since this AM. Son is main historian, states that he brought the patient breakfast this AM, he was doing well, then son left and received a call from the patient stating he was short of breath. No hx of HF per son. Has not had any fever, chills, nasal congestion. Patient denies chest pain or swelling of legs. States "I'm just short of breath."   Pt lives alone at home, requests FULL code. Walks w/walker, states he doesn't know if shortness of breath is worse with walking.    Home Medications Prior to Admission medications   Medication Sig Start Date End Date Taking? Authorizing Provider  amoxicillin-clavulanate (AUGMENTIN) 875-125 MG tablet Take 1 tablet by mouth every 12 (twelve) hours. 10/25/22  Yes Eloy Fehl L, PA  azithromycin (ZITHROMAX) 250 MG tablet Take 1 tablet (250 mg total) by mouth daily. 10/25/22  Yes Malita Ignasiak L, PA  acetaminophen (TYLENOL) 325 MG tablet Take 650 mg by mouth every 6 (six) hours as needed for mild pain, moderate pain or fever.     [provider]  amLODipine (NORVASC) 5 MG tablet TAKE 1 TABLET BY MOUTH EVERY DAY 09/16/22   [place], Peter M, MD  atorvastatin (LIPITOR) 10 MG tablet Take 1 tablet (10 mg total) by mouth every evening. 02/24/22   [place], Peter M, MD  clopidogrel (PLAVIX) 75 MG tablet Take 1 tablet by mouth daily.    [provider]  cyanocobalamin (VITAMIN B12) 1000 MCG tablet Take 1,000 mcg by mouth daily.    [provider]  finasteride (PROSCAR) 5 MG tablet Take 1 tablet by mouth daily.    [provider]  pantoprazole (PROTONIX) 40 MG tablet Take 1 tablet (40 mg  total) by mouth 2 (two) times daily. 05/21/22   Thornton Park, MD  silodosin (RAPAFLO) 4 MG CAPS capsule Take 1 tablet by mouth daily.    [provider]  torsemide (DEMADEX) 20 MG tablet Take 1 tablet (20 mg total) by mouth daily. 02/24/22   [place], Peter M, MD      Allergies    Aspirin, Tape, and Nitroglycerin    Review of Systems   Review of Systems  Respiratory:  Positive for shortness of breath.   Cardiovascular:  Negative for chest pain.    Physical Exam Updated Vital Signs BP 139/66 (BP Location: Left Arm)   Pulse 60   Temp (!) 97.5 F (36.4 C) (Oral)   Resp 19   SpO2 97%  Physical Exam Vitals and nursing note reviewed.  Constitutional:      General: He is not in acute distress.    Appearance: He is well-developed.  HENT:     Head: Normocephalic and atraumatic.  Eyes:     Conjunctiva/sclera: Conjunctivae normal.  Cardiovascular:     Rate and Rhythm: Normal rate and regular rhythm.     Heart sounds: No murmur heard. Pulmonary:     Effort: Pulmonary effort is normal. No respiratory distress.     Breath sounds: Examination of the left-lower field reveals rales. Rales present.  Abdominal:     Palpations: Abdomen is soft.  Tenderness: There is no abdominal tenderness.  Musculoskeletal:        General: No swelling.     Cervical back: Neck supple.  Skin:    General: Skin is warm and dry.     Capillary Refill: Capillary refill takes less than 2 seconds.     Comments: +multiple lesions on face, crusting and weeping of eye (which son states is chronic)  Neurological:     Mental Status: He is alert.  Psychiatric:        Mood and Affect: Mood normal.     ED Results / Procedures / Treatments   Labs (all labs ordered are listed, but only abnormal results are displayed) Labs Reviewed  COMPREHENSIVE METABOLIC PANEL - Abnormal; Notable for the following components:      Result Value   Potassium 3.3 (*)    Glucose, Bld 157 (*)    BUN 27 (*)     Creatinine, Ser 1.38 (*)    Albumin 2.6 (*)    GFR, Estimated 45 (*)    All other components within normal limits  CBC WITH DIFFERENTIAL/PLATELET - Abnormal; Notable for the following components:   RBC 3.08 (*)    Hemoglobin 8.7 (*)    HCT 28.8 (*)    Neutro Abs 7.9 (*)    All other components within normal limits  BRAIN NATRIURETIC PEPTIDE - Abnormal; Notable for the following components:   B Natriuretic Peptide 326.2 (*)    All other components within normal limits  TROPONIN I (HIGH SENSITIVITY) - Abnormal; Notable for the following components:   Troponin I (High Sensitivity) 39 (*)    All other components within normal limits  TROPONIN I (HIGH SENSITIVITY) - Abnormal; Notable for the following components:   Troponin I (High Sensitivity) 40 (*)    All other components within normal limits  RESP PANEL BY RT-PCR (RSV, FLU A&B, COVID)  [id number]    EKG EKG Interpretation  Date/Time:  Saturday October 25 2022 11:19:50 EST Ventricular Rate:  88 PR Interval:    QRS Duration: 157 QT Interval:  443 QTC Calculation: 536 R Axis:   -61 Text Interpretation: AV PACED RHYTHM Reconfirmed by Lennice Sites ([id number]) on 10/25/2022 11:59:23 AM  Radiology DG Chest 2 View  Result Date: 10/25/2022 CLINICAL DATA:  Shortness of breath.  No fever cough EXAM: CHEST - 2 VIEW COMPARISON:  09/24/2019 FINDINGS: Jovonta Levit left knee effusion. Left lower lobe airspace disease concerning for pneumonia. No pneumothorax. Stable cardiomediastinal silhouette. Dual lead cardiac pacemaker. Prior TAVR. Thoracic aortic atherosclerosis. No acute osseous abnormality. IMPRESSION: 1. Left lower lobe airspace disease concerning for pneumonia. Electronically Signed   By: Kathreen Devoid M.D.   On: 10/25/2022 12:00    Procedures Procedures    Medications Ordered in ED Medications  azithromycin (ZITHROMAX) tablet 500 mg (500 mg Oral Given 10/25/22 1357)  amoxicillin-clavulanate (AUGMENTIN) 875-125 MG per tablet 1 tablet (1 tablet  Oral Given 10/25/22 1357)    ED Course/ Medical Decision Making/ A&P             HEART Score: 7                Medical Decision Making Patient is 89 year old male, here for shortness of breath is mild since eighth this a.m., denies any chest pain.  He has a significant cardiac history will obtain troponins, EKG, BNP, CBC, and CMP for further evaluation as well as a chest x-ray.  He is nonhypoxic, respirations are unlabored.  Amount and/or Complexity of  Data Reviewed Labs: ordered.    Details: Troponin mildly elevated at 39, no leukocytosis, BNP mildly elevated Radiology: ordered.    Details: Chest x-ray shows left lower lobe airspace disease concerning for pneumonia Discussion of management or test interpretation with external provider(s): Patient is 89 year old male, he is here for shortness of breath that started this a.m., he is not hypoxic, has no pitting edema, and is speaking in full sentences.  Chest x-ray showed findings concerning for pneumonia given his crackles we will treat with azithromycin, and Augmentin, he has no white count, and he is not tachycardic, thus sepsis protocol was not initiated.  Troponin mildly elevated at 39, second troponin performed.  And it was 40.  Given that he has no chest pain, and we have a finding for his shortness of breath which is pneumonia, cardiology was not further consulted.  I discussed this with family, and azithromycin, and Augmentin were sent to the pharmacy.  We discussed return precautions, and they voiced understanding.  I worry that his etiology of shortness of breath is secondary to the pneumonia and not secondary to ACS.  Risk Prescription drug management.    Final Clinical Impression(s) / ED Diagnoses Final diagnoses:  Pneumonia of left lower lobe due to infectious organism    Rx / DC Orders ED Discharge Orders          Ordered    amoxicillin-clavulanate (AUGMENTIN) 875-125 MG tablet  Every 12 hours        10/25/22 1439     azithromycin (ZITHROMAX) 250 MG tablet  Daily        10/25/22 1439              Laurielle Selmon Carlean Jews, PA 10/25/22 1507    Lennice Sites, DO 10/25/22 1708

## 2022-10-25 NOTE — ED Notes (Signed)
Pt was given two warm blankets and family member by bedside at this time

## 2022-10-31 ENCOUNTER — Ambulatory Visit (INDEPENDENT_AMBULATORY_CARE_PROVIDER_SITE_OTHER): Payer: Medicare Other

## 2022-10-31 DIAGNOSIS — I442 Atrioventricular block, complete: Secondary | ICD-10-CM

## 2022-11-03 LAB — CUP PACEART REMOTE DEVICE CHECK
Battery Remaining Longevity: 111 mo
Battery Voltage: 3 V
Brady Statistic AP VP Percent: 38.22 %
Brady Statistic AP VS Percent: 0.3 %
Brady Statistic AS VP Percent: 60.01 %
Brady Statistic AS VS Percent: 1.47 %
Brady Statistic RA Percent Paced: 37.9 %
Brady Statistic RV Percent Paced: 98.23 %
Date Time Interrogation Session: 20240217000944
Implantable Lead Connection Status: 753985
Implantable Lead Connection Status: 753985
Implantable Lead Implant Date: 20040811
Implantable Lead Implant Date: 20040811
Implantable Lead Location: 753859
Implantable Lead Location: 753860
Implantable Lead Model: 4469
Implantable Lead Model: 4470
Implantable Lead Serial Number: 421267
Implantable Lead Serial Number: 436022
Implantable Pulse Generator Implant Date: 20220520
Lead Channel Impedance Value: 285 Ohm
Lead Channel Impedance Value: 304 Ohm
Lead Channel Impedance Value: 342 Ohm
Lead Channel Impedance Value: 399 Ohm
Lead Channel Pacing Threshold Amplitude: 0.625 V
Lead Channel Pacing Threshold Amplitude: 0.875 V
Lead Channel Pacing Threshold Pulse Width: 0.4 ms
Lead Channel Pacing Threshold Pulse Width: 0.4 ms
Lead Channel Sensing Intrinsic Amplitude: 1.5 mV
Lead Channel Sensing Intrinsic Amplitude: 1.5 mV
Lead Channel Sensing Intrinsic Amplitude: 3.25 mV
Lead Channel Sensing Intrinsic Amplitude: 3.25 mV
Lead Channel Setting Pacing Amplitude: 1.5 V
Lead Channel Setting Pacing Amplitude: 2.25 V
Lead Channel Setting Pacing Pulse Width: 0.4 ms
Lead Channel Setting Sensing Sensitivity: 0.9 mV
Zone Setting Status: 755011
Zone Setting Status: 755011

## 2022-11-19 ENCOUNTER — Telehealth: Payer: Self-pay | Admitting: Cardiology

## 2022-11-19 NOTE — Telephone Encounter (Signed)
Called pt son back he is very upset because Dr Doug Sou schedule is full. I scheduled an appt 03-10-23 pt son states that is good for him

## 2022-11-19 NOTE — Telephone Encounter (Signed)
Patient's son would like to speak with the nurse.

## 2022-11-19 NOTE — Telephone Encounter (Signed)
Called pt son back he is very upset because Dr Doug Sou schedule is full.

## 2022-11-25 NOTE — Progress Notes (Signed)
Remote pacemaker transmission.   

## 2022-11-27 ENCOUNTER — Other Ambulatory Visit: Payer: Self-pay

## 2022-11-27 ENCOUNTER — Other Ambulatory Visit: Payer: Self-pay | Admitting: Gastroenterology

## 2022-11-27 MED ORDER — PANTOPRAZOLE SODIUM 40 MG PO TBEC: 40.0000 mg | DELAYED_RELEASE_TABLET | Freq: Every day | ORAL | 1 refills | Status: DC

## 2022-12-29 ENCOUNTER — Encounter (HOSPITAL_COMMUNITY): Payer: Self-pay | Admitting: Internal Medicine

## 2022-12-29 ENCOUNTER — Other Ambulatory Visit: Payer: Self-pay

## 2022-12-29 ENCOUNTER — Emergency Department (HOSPITAL_COMMUNITY): Payer: Medicare Other

## 2022-12-29 ENCOUNTER — Inpatient Hospital Stay (HOSPITAL_COMMUNITY)
Admission: EM | Admit: 2022-12-29 | Discharge: 2023-01-03 | DRG: 683 | Disposition: A | Discharge status: Hospice | Payer: Medicare Other | Attending: Internal Medicine | Admitting: Internal Medicine

## 2022-12-29 DIAGNOSIS — J189 Pneumonia, unspecified organism: Secondary | ICD-10-CM | POA: Diagnosis not present

## 2022-12-29 DIAGNOSIS — I10 Essential (primary) hypertension: Secondary | ICD-10-CM | POA: Diagnosis not present

## 2022-12-29 DIAGNOSIS — I441 Atrioventricular block, second degree: Secondary | ICD-10-CM | POA: Diagnosis present

## 2022-12-29 DIAGNOSIS — Z953 Presence of xenogenic heart valve: Secondary | ICD-10-CM | POA: Diagnosis not present

## 2022-12-29 DIAGNOSIS — I714 Abdominal aortic aneurysm, without rupture, unspecified: Secondary | ICD-10-CM | POA: Diagnosis not present

## 2022-12-29 DIAGNOSIS — H353130 Nonexudative age-related macular degeneration, bilateral, stage unspecified: Secondary | ICD-10-CM | POA: Diagnosis present

## 2022-12-29 DIAGNOSIS — Z923 Personal history of irradiation: Secondary | ICD-10-CM

## 2022-12-29 DIAGNOSIS — D631 Anemia in chronic kidney disease: Secondary | ICD-10-CM | POA: Diagnosis present

## 2022-12-29 DIAGNOSIS — Z87891 Personal history of nicotine dependence: Secondary | ICD-10-CM

## 2022-12-29 DIAGNOSIS — Z515 Encounter for palliative care: Secondary | ICD-10-CM | POA: Diagnosis not present

## 2022-12-29 DIAGNOSIS — E782 Mixed hyperlipidemia: Secondary | ICD-10-CM | POA: Diagnosis present

## 2022-12-29 DIAGNOSIS — S41001A Unspecified open wound of right shoulder, initial encounter: Secondary | ICD-10-CM | POA: Diagnosis present

## 2022-12-29 DIAGNOSIS — N1831 Chronic kidney disease, stage 3a: Secondary | ICD-10-CM | POA: Diagnosis present

## 2022-12-29 DIAGNOSIS — N2889 Other specified disorders of kidney and ureter: Secondary | ICD-10-CM | POA: Diagnosis present

## 2022-12-29 DIAGNOSIS — R001 Bradycardia, unspecified: Secondary | ICD-10-CM | POA: Diagnosis present

## 2022-12-29 DIAGNOSIS — Z789 Other specified health status: Secondary | ICD-10-CM | POA: Diagnosis not present

## 2022-12-29 DIAGNOSIS — Z8719 Personal history of other diseases of the digestive system: Secondary | ICD-10-CM

## 2022-12-29 DIAGNOSIS — J984 Other disorders of lung: Secondary | ICD-10-CM | POA: Diagnosis not present

## 2022-12-29 DIAGNOSIS — W19XXXA Unspecified fall, initial encounter: Secondary | ICD-10-CM | POA: Diagnosis not present

## 2022-12-29 DIAGNOSIS — Y92009 Unspecified place in unspecified non-institutional (private) residence as the place of occurrence of the external cause: Secondary | ICD-10-CM

## 2022-12-29 DIAGNOSIS — Z7189 Other specified counseling: Secondary | ICD-10-CM | POA: Diagnosis not present

## 2022-12-29 DIAGNOSIS — Z66 Do not resuscitate: Secondary | ICD-10-CM | POA: Diagnosis not present

## 2022-12-29 DIAGNOSIS — Z950 Presence of cardiac pacemaker: Secondary | ICD-10-CM | POA: Diagnosis present

## 2022-12-29 DIAGNOSIS — R54 Age-related physical debility: Secondary | ICD-10-CM | POA: Diagnosis present

## 2022-12-29 DIAGNOSIS — H5462 Unqualified visual loss, left eye, normal vision right eye: Secondary | ICD-10-CM | POA: Diagnosis present

## 2022-12-29 DIAGNOSIS — R7989 Other specified abnormal findings of blood chemistry: Secondary | ICD-10-CM | POA: Diagnosis not present

## 2022-12-29 DIAGNOSIS — R0789 Other chest pain: Principal | ICD-10-CM

## 2022-12-29 DIAGNOSIS — N179 Acute kidney failure, unspecified: Secondary | ICD-10-CM | POA: Diagnosis not present

## 2022-12-29 DIAGNOSIS — H9190 Unspecified hearing loss, unspecified ear: Secondary | ICD-10-CM | POA: Diagnosis present

## 2022-12-29 DIAGNOSIS — Z711 Person with feared health complaint in whom no diagnosis is made: Secondary | ICD-10-CM

## 2022-12-29 DIAGNOSIS — I2510 Atherosclerotic heart disease of native coronary artery without angina pectoris: Secondary | ICD-10-CM | POA: Diagnosis present

## 2022-12-29 DIAGNOSIS — R638 Other symptoms and signs concerning food and fluid intake: Secondary | ICD-10-CM | POA: Diagnosis not present

## 2022-12-29 DIAGNOSIS — N400 Enlarged prostate without lower urinary tract symptoms: Secondary | ICD-10-CM | POA: Diagnosis present

## 2022-12-29 DIAGNOSIS — R918 Other nonspecific abnormal finding of lung field: Secondary | ICD-10-CM | POA: Diagnosis present

## 2022-12-29 DIAGNOSIS — C6992 Malignant neoplasm of unspecified site of left eye: Secondary | ICD-10-CM | POA: Diagnosis present

## 2022-12-29 DIAGNOSIS — Z800 Family history of malignant neoplasm of digestive organs: Secondary | ICD-10-CM

## 2022-12-29 DIAGNOSIS — R627 Adult failure to thrive: Secondary | ICD-10-CM | POA: Diagnosis present

## 2022-12-29 DIAGNOSIS — Z886 Allergy status to analgesic agent status: Secondary | ICD-10-CM

## 2022-12-29 DIAGNOSIS — N183 Chronic kidney disease, stage 3 unspecified: Secondary | ICD-10-CM | POA: Diagnosis present

## 2022-12-29 DIAGNOSIS — I129 Hypertensive chronic kidney disease with stage 1 through stage 4 chronic kidney disease, or unspecified chronic kidney disease: Secondary | ICD-10-CM | POA: Diagnosis present

## 2022-12-29 DIAGNOSIS — K219 Gastro-esophageal reflux disease without esophagitis: Secondary | ICD-10-CM | POA: Diagnosis present

## 2022-12-29 DIAGNOSIS — Z888 Allergy status to other drugs, medicaments and biological substances status: Secondary | ICD-10-CM

## 2022-12-29 DIAGNOSIS — C3412 Malignant neoplasm of upper lobe, left bronchus or lung: Secondary | ICD-10-CM | POA: Diagnosis present

## 2022-12-29 DIAGNOSIS — D649 Anemia, unspecified: Secondary | ICD-10-CM | POA: Diagnosis present

## 2022-12-29 DIAGNOSIS — Z955 Presence of coronary angioplasty implant and graft: Secondary | ICD-10-CM

## 2022-12-29 DIAGNOSIS — Z79899 Other long term (current) drug therapy: Secondary | ICD-10-CM

## 2022-12-29 DIAGNOSIS — M170 Bilateral primary osteoarthritis of knee: Secondary | ICD-10-CM | POA: Diagnosis present

## 2022-12-29 DIAGNOSIS — I2489 Other forms of acute ischemic heart disease: Secondary | ICD-10-CM | POA: Diagnosis present

## 2022-12-29 DIAGNOSIS — Z833 Family history of diabetes mellitus: Secondary | ICD-10-CM

## 2022-12-29 DIAGNOSIS — Z85828 Personal history of other malignant neoplasm of skin: Secondary | ICD-10-CM

## 2022-12-29 DIAGNOSIS — Z8249 Family history of ischemic heart disease and other diseases of the circulatory system: Secondary | ICD-10-CM

## 2022-12-29 DIAGNOSIS — Z7902 Long term (current) use of antithrombotics/antiplatelets: Secondary | ICD-10-CM

## 2022-12-29 LAB — CBC WITH DIFFERENTIAL/PLATELET
Abs Immature Granulocytes: 0.07 10*3/uL (ref 0.00–0.07)
Basophils Absolute: 0 10*3/uL (ref 0.0–0.1)
Basophils Relative: 0 %
Eosinophils Absolute: 0 10*3/uL (ref 0.0–0.5)
Eosinophils Relative: 0 %
HCT: 25.3 % — ABNORMAL LOW (ref 39.0–52.0)
Hemoglobin: 7.4 g/dL — ABNORMAL LOW (ref 13.0–17.0)
Immature Granulocytes: 0 %
Lymphocytes Relative: 2 %
Lymphs Abs: 0.3 10*3/uL — ABNORMAL LOW (ref 0.7–4.0)
MCH: 25 pg — ABNORMAL LOW (ref 26.0–34.0)
MCHC: 29.2 g/dL — ABNORMAL LOW (ref 30.0–36.0)
MCV: 85.5 fL (ref 80.0–100.0)
Monocytes Absolute: 0.9 10*3/uL (ref 0.1–1.0)
Monocytes Relative: 5 %
Neutro Abs: 15.8 10*3/uL — ABNORMAL HIGH (ref 1.7–7.7)
Neutrophils Relative %: 93 %
Platelets: 252 10*3/uL (ref 150–400)
RBC: 2.96 MIL/uL — ABNORMAL LOW (ref 4.22–5.81)
RDW: 15.1 % (ref 11.5–15.5)
WBC: 17.1 10*3/uL — ABNORMAL HIGH (ref 4.0–10.5)
nRBC: 0 % (ref 0.0–0.2)

## 2022-12-29 LAB — I-STAT CHEM 8, ED
BUN: 37 mg/dL — ABNORMAL HIGH (ref 8–23)
Calcium, Ion: 1.17 mmol/L (ref 1.15–1.40)
Chloride: 98 mmol/L (ref 98–111)
Creatinine, Ser: 2.1 mg/dL — ABNORMAL HIGH (ref 0.61–1.24)
Glucose, Bld: 128 mg/dL — ABNORMAL HIGH (ref 70–99)
HCT: 25 % — ABNORMAL LOW (ref 39.0–52.0)
Hemoglobin: 8.5 g/dL — ABNORMAL LOW (ref 13.0–17.0)
Potassium: 4.2 mmol/L (ref 3.5–5.1)
Sodium: 138 mmol/L (ref 135–145)
TCO2: 31 mmol/L (ref 22–32)

## 2022-12-29 LAB — COMPREHENSIVE METABOLIC PANEL
ALT: 19 U/L (ref 0–44)
AST: 33 U/L (ref 15–41)
Albumin: 2.5 g/dL — ABNORMAL LOW (ref 3.5–5.0)
Alkaline Phosphatase: 65 U/L (ref 38–126)
Anion gap: 15 (ref 5–15)
BUN: 40 mg/dL — ABNORMAL HIGH (ref 8–23)
CO2: 27 mmol/L (ref 22–32)
Calcium: 9.5 mg/dL (ref 8.9–10.3)
Chloride: 96 mmol/L — ABNORMAL LOW (ref 98–111)
Creatinine, Ser: 2.17 mg/dL — ABNORMAL HIGH (ref 0.61–1.24)
GFR, Estimated: 26 mL/min — ABNORMAL LOW (ref >60)
Glucose, Bld: 134 mg/dL — ABNORMAL HIGH (ref 70–99)
Potassium: 4.2 mmol/L (ref 3.5–5.1)
Sodium: 138 mmol/L (ref 135–145)
Total Bilirubin: 0.8 mg/dL (ref 0.3–1.2)
Total Protein: 7.1 g/dL (ref 6.5–8.1)

## 2022-12-29 LAB — TROPONIN I (HIGH SENSITIVITY)
Troponin I (High Sensitivity): 129 ng/L (ref <18)
Troponin I (High Sensitivity): 144 ng/L (ref <18)

## 2022-12-29 LAB — CBG MONITORING, ED: Glucose-Capillary: 117 mg/dL — ABNORMAL HIGH (ref 70–99)

## 2022-12-29 MED ORDER — ATORVASTATIN CALCIUM 10 MG PO TABS
10.0000 mg | ORAL_TABLET | Freq: Every evening | ORAL | Status: DC
  Administered 2022-12-29: 10 mg via ORAL
  Filled 2022-12-29: qty 1

## 2022-12-29 MED ORDER — POLYETHYLENE GLYCOL 3350 17 G PO PACK: 17.0000 g | PACK | Freq: Every day | ORAL | Status: DC | PRN

## 2022-12-29 MED ORDER — PANTOPRAZOLE SODIUM 40 MG PO TBEC
40.0000 mg | DELAYED_RELEASE_TABLET | Freq: Every day | ORAL | Status: DC
  Administered 2022-12-29 – 2022-12-30 (×2): 40 mg via ORAL
  Filled 2022-12-29 (×2): qty 1

## 2022-12-29 MED ORDER — BISACODYL 5 MG PO TBEC: 5.0000 mg | DELAYED_RELEASE_TABLET | Freq: Every day | ORAL | Status: DC | PRN

## 2022-12-29 MED ORDER — ONDANSETRON HCL 4 MG/2ML IJ SOLN: 4.0000 mg | Freq: Four times a day (QID) | INTRAMUSCULAR | Status: DC | PRN

## 2022-12-29 MED ORDER — FINASTERIDE 5 MG PO TABS
5.0000 mg | ORAL_TABLET | Freq: Every day | ORAL | Status: DC
  Administered 2022-12-29 – 2022-12-30 (×2): 5 mg via ORAL
  Filled 2022-12-29 (×4): qty 1

## 2022-12-29 MED ORDER — LACTATED RINGERS IV SOLN: INTRAVENOUS | Status: DC

## 2022-12-29 MED ORDER — SODIUM CHLORIDE 0.9 % IV BOLUS (SEPSIS)
1000.0000 mL | Freq: Once | INTRAVENOUS | Status: AC
  Administered 2022-12-29: 1000 mL via INTRAVENOUS

## 2022-12-29 MED ORDER — ACETAMINOPHEN 325 MG PO TABS: 650.0000 mg | ORAL_TABLET | Freq: Four times a day (QID) | ORAL | Status: DC | PRN

## 2022-12-29 MED ORDER — SODIUM CHLORIDE 0.9 % IV SOLN
1000.0000 mL | INTRAVENOUS | Status: DC
  Administered 2022-12-29: 1000 mL via INTRAVENOUS

## 2022-12-29 MED ORDER — MORPHINE SULFATE (PF) 2 MG/ML IV SOLN
2.0000 mg | Freq: Once | INTRAVENOUS | Status: AC
  Administered 2022-12-29: 2 mg via INTRAVENOUS
  Filled 2022-12-29: qty 1

## 2022-12-29 MED ORDER — ACETAMINOPHEN 650 MG RE SUPP: 650.0000 mg | Freq: Four times a day (QID) | RECTAL | Status: DC | PRN

## 2022-12-29 MED ORDER — TAMSULOSIN HCL 0.4 MG PO CAPS
0.4000 mg | ORAL_CAPSULE | Freq: Every day | ORAL | Status: DC
  Administered 2022-12-29 – 2022-12-30 (×2): 0.4 mg via ORAL
  Filled 2022-12-29 (×3): qty 1

## 2022-12-29 MED ORDER — OXYCODONE HCL 5 MG PO TABS
5.0000 mg | ORAL_TABLET | ORAL | Status: DC | PRN
  Administered 2022-12-29 – 2022-12-30 (×2): 5 mg via ORAL
  Filled 2022-12-29 (×2): qty 1

## 2022-12-29 MED ORDER — ONDANSETRON HCL 4 MG PO TABS: 4.0000 mg | ORAL_TABLET | Freq: Four times a day (QID) | ORAL | Status: DC | PRN

## 2022-12-29 MED ORDER — ENOXAPARIN SODIUM 30 MG/0.3ML IJ SOSY: 30.0000 mg | PREFILLED_SYRINGE | INTRAMUSCULAR | Status: DC

## 2022-12-29 MED ORDER — DOCUSATE SODIUM 100 MG PO CAPS
100.0000 mg | ORAL_CAPSULE | Freq: Two times a day (BID) | ORAL | Status: DC
  Administered 2022-12-29 – 2022-12-30 (×2): 100 mg via ORAL
  Filled 2022-12-29 (×2): qty 1

## 2022-12-29 MED ORDER — HYDRALAZINE HCL 20 MG/ML IJ SOLN: 5.0000 mg | INTRAMUSCULAR | Status: DC | PRN

## 2022-12-29 MED ORDER — AMLODIPINE BESYLATE 5 MG PO TABS
5.0000 mg | ORAL_TABLET | Freq: Every day | ORAL | Status: DC
  Administered 2022-12-29 – 2022-12-30 (×2): 5 mg via ORAL
  Filled 2022-12-29 (×2): qty 1

## 2022-12-29 MED ORDER — ALBUTEROL SULFATE (2.5 MG/3ML) 0.083% IN NEBU: 2.5000 mg | INHALATION_SOLUTION | RESPIRATORY_TRACT | Status: DC | PRN

## 2022-12-29 MED ORDER — CLOPIDOGREL BISULFATE 75 MG PO TABS
75.0000 mg | ORAL_TABLET | Freq: Every day | ORAL | Status: DC
  Administered 2022-12-29 – 2022-12-30 (×2): 75 mg via ORAL
  Filled 2022-12-29 (×3): qty 1

## 2022-12-29 NOTE — Consult Note (Addendum)
Consultation Note Date: 12/29/2022   Patient Name: Jordan [name]  DOB: 1918-08-13  MRN: [id number]  Age / Sex: 89 y.o., male  PCP: Garlan Fillers, MD Referring Physician: Jonah Blue, MD  Reason for Consultation: Establishing goals of care  HPI/Patient Profile: 89 y.o. male  with past medical history of AAA, BPH, CAD s/p stent, pacemaker, HTN, HLD, L renal mass, and s/p TAVR, BCC of skin not on treatment presented to ED on 12/29/22 from home with complaints of chest pain s/p unwitnessed fall vs syncopal event. Patient was admitted on 12/29/2022 with fall at home, chest pain, AKI on CKD 3a, anemia, possible lung cancer, AAA.     Clinical Assessment and Goals of Care: I have reviewed medical records including EPIC notes, labs, and imaging. Received report from primary RN - no acute concerns.   Went to visit patient at bedside - DIL/Fran present. Patient was lying in bed awake, alert, oriented to person, and able to participate in simple conversation. No signs or non-verbal gestures of pain or discomfort noted. No respiratory distress, increased work of breathing, or secretions noted. Patient endorses "a little pain" in his chest, though it has improved from yesterday. He is on acute 2L O2 [place]. Patient is HOH and intermittently awake/asleep during visit.  Met with Fram  to discuss diagnosis, prognosis, GOC, EOL wishes, disposition, and options. Offered to include patient's son/[name] - he is not currently available to meet per Drenda Freeze.  I introduced Palliative Medicine as specialized medical care for people living with serious illness. It focuses on providing relief from the symptoms and stress of a serious illness. The goal is to improve quality of life for both the patient and the family.  We discussed a brief life review of the patient as well as functional and nutritional status. Patient's wife passed away 4  years ago - they had two sons, 1 son is still living/[name]. Prior to hospitalization, patient was living at home alone; though son and DIL checked on him frequently throughout the day between bringing him breakfast and dinner as well as phone calls in between in person visits. Patient does not have any HHaid assistance. He enjoyed golfing until his early-mid 39s. Drenda Freeze tells me patient was independent, driving, performing ADLs, grocery shopping up until January 2024. It was at this time family started to notice a gradual decline in his functional ability. Son now brings patient two meals a day. Patient is only able to ambulate small distances within the house using a cane/walker. Around 1.5 months ago, patient's appetite declined - he only wanted to eat breakfast and small snacks during the day. Albumin noted at 2.5 on 12/29/22. Family are having a hard time physically getting him to medical appointments.   We discussed patient's current illness and what it means in the larger context of patient's on-going co-morbidities. Drenda Freeze has a clear understanding of patient's current acute medical situation: we reviewed CXR concerning for cavitary lesion (likely not a candidate for invasive work up or disease modifying treatment options), enlarging AAA with increasing risk of rupture (likely not a candidate for invasive intervention), worsening anemia (likely not a candidate for invasive work up), cancer in patient's eye currently not on treatment. Natural disease trajectory and expectations at EOL were discussed. I attempted to elicit values and goals of care important to the patient. The difference between aggressive medical intervention and comfort care was considered in light of the patient's goals of care.   Hospice and Palliative Care services  outpatient were explained and offered. Education provided on the difference between Palliative and Hospice care. Palliative care and hospice have similar goals of managing  symptoms, promoting comfort, improving quality of life, and maintaining a person's dignity. However, palliative care may be offered during any phase of a serious illness, while hospice care is usually offered when a person is expected to live for 6 months or less.   Son/[name] called Drenda Freeze to check in - she provided updates. Harvie Heck is not able to participate in GOC at this time and defers conversation to [place].   Advance directives, concepts specific to code status, artificial feeding and hydration, and rehospitalization were considered and discussed. DNR/DNI was confirmed. Anticipatory care planning discussed in detail. Reviewed that patient will likely need 24/7 supervision and assistance going forward. Patient is not able to live with son/DIL. We reviewed option of private duty caregivers +/- hospice.   Provided education and counseling at length on the philosophy and benefits of hospice care. Discussed that it offers a holistic approach to care in the setting of end-stage illness, and is about supporting the patient where they are allowing nature to take it's course. Discussed the hospice team includes RNs, physicians, social workers, and chaplains. They can provide personal care, support for the family, and help keep patient out of the hospital as well as assist with DME needs for home hospice. Education provided on the difference between home vs residential hospice.   Harvie Heck and Drenda Freeze are familiar with hospice services as they utilized them when patient's wife was at EOL - used [facility], medical and had a good experience. Drenda Freeze feels Harvie Heck may be open to exploring hospice. She is clear that Harvie Heck "made a promise" to the patient to not place him in a nursing home - they are hopeful this can be avoided.   We discussed the likelihood patient may reach levels indicating need for blood transfusion - I questioned if family/patient would want blood transfusion knowing patient would be at risk of recurrence as he is  likely not a candidate to explore the cause of bleeding. Drenda Freeze tells me she is not sure - she would want Harvie Heck to be included in these decisions regarding medical boundaries and hospice.  Offered to schedule meeting - she will speak with him and call PMT with a time to meet for continued GOC.   We discussed patient's risk for decline - they would not want escalation of care in this instance.   Discussed with patient/family the importance of continued conversation with each other and the medical providers regarding overall plan of care and treatment options, ensuring decisions are within the context of the patient's values and GOCs.    Questions and concerns were addressed. The patient/family was encouraged to call with questions and/or concerns. PMT card was provided.   Primary Decision Maker: NEXT OF KIN - son/[name] [place]aw[place]i with assistance from his wife/Fran Ruz    SUMMARY OF RECOMMENDATIONS   Continue current gentle medical interventions without escalation of care. No invasive procedures Confirmed DNR/DNI I was able to speak with patient's DIL; however, was not able to speak with son/legal decision maker today - he will call PMT with a time to meet tomorrow 4/16 Family considering patient's discharge with hospice PMT will continue to follow and support holistically   Code Status/Advance Care Planning: DNR  Palliative Prophylaxis:  Aspiration, Bowel Regimen, Delirium Protocol, Eye Care, Frequent Pain Assessment, Oral Care, and Turn Reposition  Additional Recommendations (Limitations, Scope, Preferences): Full Scope Treatment, No  Artificial Feeding, No Surgical Procedures, and No Tracheostomy  Psycho-social/Spiritual:  Desire for further Chaplaincy support:no Created space and opportunity for patient and family to express thoughts and feelings regarding patient's current medical situation.  Emotional support and therapeutic listening provided.  Prognosis:  Poor  Discharge  Planning: To Be Determined      Primary Diagnoses: Present on Admission:  Left upper lobe pneumonia   I have reviewed the medical record, interviewed the patient and family, and examined the patient. The following aspects are pertinent.  Past Medical History:  Diagnosis Date   AAA (abdominal aortic aneurysm)    Anemia    Aortic stenosis    Arthritis 09/18/2011   "in my knees"   Basal cell carcinoma of skin    Blood transfusion    BPH (benign prostatic hyperplasia)    CAD (coronary artery disease)    Remote stent to LAD in 1999. Last cath in 2004 and he is managed medically   Colon polyps    Complication of anesthesia    Depression    GERD (gastroesophageal reflux disease)    Hemorrhoid    History of radiation therapy 05/23/20-06/19/20   Radiation to scalp and RUE , Dr. 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   26 mm Edwards Sapien 3 transcatheter heart valve placed via open left transfemoral approach   Social History   Socioeconomic History   Marital status: Widowed    Spouse name: Not on file   Number of children: 2   Years of education: Not on file   Highest education level: Not on file  Occupational History   Occupation: Retired    Comment: welder  Tobacco Use   Smoking status: Former    Packs/day: 1.00    Years: 42.00    Additional pack years: 0.00    Total pack years: 42.00    Types: Cigarettes    Quit date: 08/18/1974    Years since quitting: 48.3   Smokeless tobacco: Former    Types: Associate Professor Use: Never used  Substance and Sexual Activity   Alcohol use: No    Alcohol/week: 0.0 standard drinks of alcohol   Drug use: No   Sexual activity: Not Currently  Other Topics Concern   Not on file  Social History Narrative   Not on file   Social Determinants of Health    Financial Resource Strain: Not on file  Food Insecurity: Not on file  Transportation Needs: Not on file  Physical Activity: Not on file  Stress: Not on file  Social Connections: Not on file   Family History  Problem Relation Age of Onset   Cancer Father        stomach   Colon cancer Father    Heart disease Brother    Heart disease Brother    Diabetes Other        granddaughter   Scheduled Meds:  amLODipine  5 mg Oral Daily   atorvastatin  10 mg Oral QPM   clopidogrel  75 mg Oral Daily   docusate sodium  100 mg Oral BID   enoxaparin (LOVENOX) injection  30 mg Subcutaneous Q24H   finasteride  5 mg Oral Daily   pantoprazole  40 mg Oral Daily  tamsulosin  0.4 mg Oral Daily   Continuous Infusions:  lactated ringers 75 mL/hr at 12/29/22 1133   PRN Meds:.acetaminophen **OR** acetaminophen, albuterol, bisacodyl, hydrALAZINE, ondansetron **OR** ondansetron (ZOFRAN) IV, oxyCODONE, polyethylene glycol Medications Prior to Admission:  Prior to Admission medications   Medication Sig Start Date End Date Taking? Authorizing Provider  acetaminophen (TYLENOL) 325 MG tablet Take 650 mg by mouth every 6 (six) hours as needed for mild pain, moderate pain or fever.    Yes [provider]  amLODipine (NORVASC) 5 MG tablet TAKE 1 TABLET BY MOUTH EVERY DAY 09/16/22  Yes [place], Peter M, MD  atorvastatin (LIPITOR) 10 MG tablet Take 1 tablet (10 mg total) by mouth every evening. 02/24/22  Yes [place], Peter M, MD  clopidogrel (PLAVIX) 75 MG tablet Take 1 tablet by mouth daily.   Yes [provider]  cyanocobalamin (VITAMIN B12) 1000 MCG tablet Take 1,000 mcg by mouth daily.   Yes [provider]  finasteride (PROSCAR) 5 MG tablet Take 1 tablet by mouth daily.   Yes [provider]  pantoprazole (PROTONIX) 40 MG tablet Take 1 tablet (40 mg total) by mouth daily. 11/27/22  Yes Tressia Danas, MD  silodosin (RAPAFLO) 4 MG CAPS capsule Take 1 tablet by mouth daily.    Yes [provider]  torsemide (DEMADEX) 20 MG tablet Take 1 tablet (20 mg total) by mouth daily. 02/24/22  Yes [place], Peter M, MD   Allergies  Allergen Reactions   Aspirin Other (See Comments)    Results in stomach bleeds   Tape Other (See Comments)    SKIN IS THIN AND WILL TEAR EASILY!!   Nitroglycerin Other (See Comments)    Makes the patient faint   Review of Systems  Constitutional:  Positive for activity change and appetite change.  Respiratory:  Negative for shortness of breath.   Gastrointestinal:  Negative for nausea and vomiting.  Neurological:  Positive for weakness.  All other systems reviewed and are negative.   Physical Exam Vitals and nursing note reviewed.  Constitutional:      General: He is not in acute distress.    Appearance: He is ill-appearing.  Pulmonary:     Effort: No respiratory distress.  Skin:    General: Skin is warm and dry.  Neurological:     Mental Status: He is disoriented and confused.     Motor: Weakness present.     Comments: Intermittently awake/asleep  Psychiatric:        Attention and Perception: Attention normal.        Behavior: Behavior is cooperative.        Cognition and Memory: Cognition is impaired. Memory is impaired.     Vital Signs: BP 121/69   Pulse 89   Temp 98.4 F (36.9 C)   Resp 16   Ht 5\' 2"  (1.575 m)   Wt 56.7 kg   SpO2 96%   BMI 22.86 kg/m  Pain Scale: 0-10   Pain Score: 9    SpO2: SpO2: 96 % O2 Device:SpO2: 96 % O2 Flow Rate: .O2 Flow Rate (L/min): 2 L/min  IO: Intake/output summary:  Intake/Output Summary (Last 24 hours) at 12/29/2022 1156 Last data filed at 12/29/2022 0435 Gross per 24 hour  Intake 1000 ml  Output --  Net 1000 ml    LBM: Last BM Date : 12/28/22 Baseline Weight: Weight: 56.7 kg Most recent weight: Weight: 56.7 kg     Palliative Assessment/Data: PPS 40%     Time  In: 1155 Time Out: 1325 Time Total: 90 minutes  Greater than 50%  of this time was spent  counseling and coordinating care related to the above assessment and plan.  Signed by: Haskel Khan, NP   Please contact Palliative Medicine Team phone at [phone] for questions and concerns.  For individual provider: See Amion  *Portions of this note are a verbal dictation therefore any spelling and/or grammatical errors are due to the "[employer]agon [employer] One" system interpretation.

## 2022-12-29 NOTE — ED Provider Notes (Signed)
[place] EMERGENCY DEPARTMENT AT [facility] Provider Note  CSN: [id number] Arrival date & time: 12/29/22 4098  Chief Complaint(s) Chest Pain  HPI Jordan [name] is a 89 y.o. male with extensive past medical history listed below including hypertension, CAD status post stenting currently treated medically, status post TAVR, AAA, basal/squamous cell carcinoma who stopped seeking treatment.  He presents to the emergency department for chest pain.  Patient was found down on the floor next to his recliner by the son.  Patient lives alone but his son comes by once or twice a day to check on him.  He last spoke to the father around 4 PM this afternoon.  Normally he is alert and oriented and able to care for himself however recently he has been more sedentary and using the walker to get around.  Around 7 PM the son called and the patient would not answer prompting him to check on him.  the son reports that the patient was not able to help him transition back into the lift chair prompting his visit.  The history is provided by the patient.    Past Medical History Past Medical History:  Diagnosis Date   AAA (abdominal aortic aneurysm) (HCC)    Anemia    Aortic stenosis    Arthritis 09/18/2011   "in my knees"   Basal cell carcinoma of skin    Blood transfusion    BPH (benign prostatic hyperplasia)    CAD (coronary artery disease)    Remote stent to LAD in 1999. Last cath in 2004 and he is managed medically   Colon polyps    Complication of anesthesia    Depression    Dysrhythmia    paced   GERD (gastroesophageal reflux disease)    Hemorrhoid    History of radiation therapy 05/23/20-06/19/20   Radiation to scalp and RUE , Dr. 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   26 mm Edwards Sapien 3  transcatheter heart valve placed via open left transfemoral approach   Patient Active Problem List   Diagnosis Date Noted   Pressure injury of skin 06/28/2021   Gross hematuria 06/22/2021   CKD (chronic kidney disease), stage III 06/22/2021   Complete heart block 09/21/2020   HTN (hypertension) 09/21/2020   Squamous cell carcinoma of skin of scalp 05/09/2020   Squamous cell cancer of multiple sites of skin of upper arm, right 05/09/2020   Cystoid macular edema of right eye 02/20/2020   Early stage nonexudative age-related macular degeneration of both eyes 02/20/2020   Macular pucker, right eye 02/20/2020   Syncope 09/24/2019   AAA (abdominal aortic aneurysm) without rupture 08/14/2015   Severe aortic valve stenosis 05/15/2015   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   Aortic stenosis, severe 05/03/2015   Chest pain 03/09/2015   Abdominal aneurysm without mention of rupture 02/24/2013   Pulmonary nodule, right 02/10/2013   Mobitz type II atrioventricular block 03/09/2012   Effusion of olecranon bursa, left 01/28/2012   Aortic stenosis 05/06/2011   Pacemaker 10/29/2010   ABDOMINAL PAIN-LUQ 06/21/2008   PERSONAL HX COLONIC POLYPS 06/21/2008   Hyperlipemia, mixed 06/19/2008   ANEMIA 06/19/2008   Coronary atherosclerosis 06/19/2008   Home Medication(s) Prior to Admission medications   Medication  Sig Start Date End Date Taking? Authorizing Provider  acetaminophen (TYLENOL) 325 MG tablet Take 650 mg by mouth every 6 (six) hours as needed for mild pain, moderate pain or fever.    Yes [provider]  amLODipine (NORVASC) 5 MG tablet TAKE 1 TABLET BY MOUTH EVERY DAY 09/16/22  Yes [place], Peter M, MD  atorvastatin (LIPITOR) 10 MG tablet Take 1 tablet (10 mg total) by mouth every evening. 02/24/22  Yes [place], Peter M, MD  clopidogrel (PLAVIX) 75 MG tablet Take 1 tablet by mouth daily.   Yes [provider]  cyanocobalamin (VITAMIN B12) 1000 MCG tablet  Take 1,000 mcg by mouth daily.   Yes [provider]  finasteride (PROSCAR) 5 MG tablet Take 1 tablet by mouth daily.   Yes [provider]  pantoprazole (PROTONIX) 40 MG tablet Take 1 tablet (40 mg total) by mouth daily. 11/27/22  Yes Tressia Danas, MD  silodosin (RAPAFLO) 4 MG CAPS capsule Take 1 tablet by mouth daily.   Yes [provider]  torsemide (DEMADEX) 20 MG tablet Take 1 tablet (20 mg total) by mouth daily. 02/24/22  Yes [place], Peter M, MD                                                                                                                                    Allergies Aspirin, Tape, and Nitroglycerin  Review of Systems Review of Systems As noted in HPI  Physical Exam Vital Signs  I have reviewed the triage vital signs BP 120/77   Pulse 88   Temp 97.8 F (36.6 C) (Oral)   Resp 14   Ht 5\' 2"  (1.575 m)   Wt 56.7 kg   SpO2 98%   BMI 22.86 kg/m   Physical Exam Vitals reviewed.  Constitutional:      General: He is not in acute distress.    Appearance: He is well-developed. He is not diaphoretic.  HENT:     Head: Normocephalic and atraumatic.     Nose: Nose normal.  Eyes:     General: No scleral icterus.       Right eye: No discharge.        Left eye: No discharge.     Pupils: Pupils are equal, round, and reactive to light.   Cardiovascular:     Rate and Rhythm: Normal rate and regular rhythm.     Heart sounds: No murmur heard.    No friction rub. No gallop.  Pulmonary:     Effort: Pulmonary effort is normal. No respiratory distress.     Breath sounds: Normal breath sounds. No stridor. No rales.  Chest:     Chest wall: Tenderness present.    Abdominal:     General: There is no distension.     Palpations: Abdomen is soft.     Tenderness: There is no abdominal tenderness.  Musculoskeletal:  General: No tenderness.     Cervical back: Normal range of motion and neck supple. No bony tenderness.     Thoracic back:  No bony tenderness.     Lumbar back: No bony tenderness.     Comments: kyphotic  Skin:    General: Skin is warm and dry.     Findings: Rash present. No erythema. Rash is crusting and scaling.  Neurological:     Mental Status: He is alert and oriented to person, place, and time.     ED Results and Treatments Labs (all labs ordered are listed, but only abnormal results are displayed) Labs Reviewed  CBC WITH DIFFERENTIAL/PLATELET - Abnormal; Notable for the following components:      Result Value   WBC 17.1 (*)    RBC 2.96 (*)    Hemoglobin 7.4 (*)    HCT 25.3 (*)    MCH 25.0 (*)    MCHC 29.2 (*)    Neutro Abs 15.8 (*)    Lymphs Abs 0.3 (*)    All other components within normal limits  COMPREHENSIVE METABOLIC PANEL - Abnormal; Notable for the following components:   Chloride 96 (*)    Glucose, Bld 134 (*)    BUN 40 (*)    Creatinine, Ser 2.17 (*)    Albumin 2.5 (*)    GFR, Estimated 26 (*)    All other components within normal limits  CBG MONITORING, ED - Abnormal; Notable for the following components:   Glucose-Capillary 117 (*)    All other components within normal limits  I-STAT CHEM 8, ED - Abnormal; Notable for the following components:   BUN 37 (*)    Creatinine, Ser 2.10 (*)    Glucose, Bld 128 (*)    Hemoglobin 8.5 (*)    HCT 25.0 (*)    All other components within normal limits  TROPONIN I (HIGH SENSITIVITY) - Abnormal; Notable for the following components:   Troponin I (High Sensitivity) 129 (*)    All other components within normal limits  TROPONIN I (HIGH SENSITIVITY) - Abnormal; Notable for the following components:   Troponin I (High Sensitivity) 144 (*)    All other components within normal limits                                                                                                                         EKG  EKG Interpretation  Date/Time:  Monday December 29 2022 01:05:47 EDT Ventricular Rate:  70 PR Interval:  52 QRS Duration: 177 QT  Interval:  443 QTC Calculation: 489 R Axis:   -56 Text Interpretation: Sinus rhythm Supraventricular bigeminy Short PR interval Left bundle branch block Confirmed by Drema Pry [phone]) on 12/29/2022 2:35:34 AM       Radiology CT CHEST ABDOMEN PELVIS WO CONTRAST  Result Date: 12/29/2022 CLINICAL DATA:  Trauma/fall EXAM: CT CHEST, ABDOMEN AND PELVIS WITHOUT CONTRAST TECHNIQUE: Multidetector CT imaging of the chest, abdomen and pelvis was performed following the  standard protocol without IV contrast. RADIATION DOSE REDUCTION: This exam was performed according to the departmental dose-optimization program which includes automated exposure control, adjustment of the mA and/or kV according to patient size and/or use of iterative reconstruction technique. COMPARISON:  CT abdomen/pelvis dated 06/22/2021 FINDINGS: CT CHEST FINDINGS Cardiovascular: The heart is top-normal in size. No pericardial effusion. Status post TAVR. No evidence of thoracic aortic aneurysm. Atherosclerotic calcifications of the aortic arch. Severe three-vessel coronary atherosclerosis. Right subclavian pacemaker. Mediastinum/Nodes: No evidence of anterior mediastinal hematoma. No suspicious mediastinal lymphadenopathy. Visualized thyroid is unremarkable. Lungs/Pleura: Bilobed cavitary mass in the left upper lobe, measuring 4.3 x 4.9 x 7.1 cm, highly suspicious for primary bronchogenic neoplasm, possibly squamous cell carcinoma. Mild dependent atelectasis in the posterior right upper lobe and bilateral lower lobes, right greater than left. No focal consolidation. No pleural effusion or pneumothorax. Musculoskeletal: Moderate compression fracture deformities of T6, T8, and T9, age indeterminate but likely chronic. Multiple old bilateral rib fracture deformities, without acute fracture. CT ABDOMEN PELVIS FINDINGS Hepatobiliary: Unenhanced liver is unremarkable. Layering small gallstones (series 2/image 46), without associated inflammatory  changes. No intrahepatic or extrahepatic duct dilatation. Pancreas: Within normal limits. Spleen: Within normal limits. Adrenals/Urinary Tract: Adrenal glands are within normal limits. Multiple bilateral renal cysts, measuring up to 5.2 cm in the left lower kidney (series 2/image 53), benign (Bosniak I). No follow-up is recommended. No hydronephrosis. Thick-walled, trabeculated bladder, suggesting chronic bladder outlet obstruction. Stomach/Bowel: Stomach is within normal limits. No evidence of bowel obstruction. Appendix is not discretely visualized. Mild left colonic diverticulosis, without evidence of diverticulitis. Vascular/Lymphatic: 5.7 cm infrarenal abdominal aortic aneurysm (series 2/image 65), previously 5.4 cm. Atherosclerotic calcifications of the abdominal aorta and branch vessels. No suspicious abdominopelvic lymphadenopathy. Reproductive: Prostatomegaly, suggesting BPH. Other: No abdominopelvic ascites. Musculoskeletal: Mild degenerative changes of the lumbar spine. Subacute/healing right inferior pubic ramus fracture. IMPRESSION: No acute traumatic injury to the chest, abdomen, or pelvis. 7.1 cm cavitary left upper lobe mass, highly suspicious for primary bronchogenic neoplasm, possibly squamous cell carcinoma. 5.7 cm infrarenal abdominal aortic aneurysm, previously 5.4 cm. Consider vascular consultation as clinically warranted given the patient's age. Additional ancillary findings as above. Electronically Signed   By: Charline Bills M.D.   On: 12/29/2022 04:14   CT Head Wo Contrast  Result Date: 12/29/2022 CLINICAL DATA:  Fall. EXAM: CT HEAD WITHOUT CONTRAST CT CERVICAL SPINE WITHOUT CONTRAST TECHNIQUE: Multidetector CT imaging of the head and cervical spine was performed following the standard protocol without intravenous contrast. Multiplanar CT image reconstructions of the cervical spine were also generated. RADIATION DOSE REDUCTION: This exam was performed according to the departmental  dose-optimization program which includes automated exposure control, adjustment of the mA and/or kV according to patient size and/or use of iterative reconstruction technique. COMPARISON:  None Available. FINDINGS: CT HEAD FINDINGS Brain: No evidence of acute infarction, hemorrhage, hydrocephalus, extra-axial collection or mass lesion/mass effect. Vascular: No hyperdense vessel or unexpected calcification. Skull: Normal. Negative for fracture or focal lesion. Sinuses/Orbits: Infiltrative soft tissue density mass around the left orbit, on reformats infiltrating into the soft tissues above the globe including the extraocular muscles. Other: Extensive streak artifact from positioning CT CERVICAL SPINE FINDINGS Alignment: Positional distortion.  No traumatic malalignment. Skull base and vertebrae: No acute fracture Soft tissues and spinal canal: No prevertebral fluid or swelling. No visible canal hematoma. Disc levels:  Limited degenerative changes for age. Upper chest: Reported separately IMPRESSION: 1. No evidence of acute intracranial or cervical spine injury. 2. Infiltrative mass at  the left orbit with deep extension above the globe. Electronically Signed   By: Tiburcio Pea M.D.   On: 12/29/2022 04:08   CT Cervical Spine Wo Contrast  Result Date: 12/29/2022 CLINICAL DATA:  Fall. EXAM: CT HEAD WITHOUT CONTRAST CT CERVICAL SPINE WITHOUT CONTRAST TECHNIQUE: Multidetector CT imaging of the head and cervical spine was performed following the standard protocol without intravenous contrast. Multiplanar CT image reconstructions of the cervical spine were also generated. RADIATION DOSE REDUCTION: This exam was performed according to the departmental dose-optimization program which includes automated exposure control, adjustment of the mA and/or kV according to patient size and/or use of iterative reconstruction technique. COMPARISON:  None Available. FINDINGS: CT HEAD FINDINGS Brain: No evidence of acute infarction,  hemorrhage, hydrocephalus, extra-axial collection or mass lesion/mass effect. Vascular: No hyperdense vessel or unexpected calcification. Skull: Normal. Negative for fracture or focal lesion. Sinuses/Orbits: Infiltrative soft tissue density mass around the left orbit, on reformats infiltrating into the soft tissues above the globe including the extraocular muscles. Other: Extensive streak artifact from positioning CT CERVICAL SPINE FINDINGS Alignment: Positional distortion.  No traumatic malalignment. Skull base and vertebrae: No acute fracture Soft tissues and spinal canal: No prevertebral fluid or swelling. No visible canal hematoma. Disc levels:  Limited degenerative changes for age. Upper chest: Reported separately IMPRESSION: 1. No evidence of acute intracranial or cervical spine injury. 2. Infiltrative mass at the left orbit with deep extension above the globe. Electronically Signed   By: Tiburcio Pea M.D.   On: 12/29/2022 04:08   DG Chest 2 View  Result Date: 12/29/2022 CLINICAL DATA:  Anterior chest wall pain after fall yesterday. Low oxygen saturations. EXAM: CHEST - 2 VIEW COMPARISON:  Radiographs 10/25/2022. FINDINGS: The chin obscures the upper lungs. Stable enlargement of the cardiomediastinal silhouette. TAVR. Right chest wall pacemaker. Patchy opacity in the left mid lung is similar to 10/25/2022. Trace left pleural effusion. No definite pneumothorax. No definite displaced rib fractures. Similar wedging of a few midthoracic vertebral bodies. IMPRESSION: Patchy airspace opacity in the left mid lung is similar to 10/25/2022. There is some central lucency suspicious for cavitation. Chest CT is recommended for further evaluation. Electronically Signed   By: Minerva Fester M.D.   On: 12/29/2022 02:11    Medications Ordered in ED Medications  sodium chloride 0.9 % bolus 1,000 mL (0 mLs Intravenous Stopped 12/29/22 0435)    Followed by  0.9 %  sodium chloride infusion (1,000 mLs Intravenous New  Bag/Given 12/29/22 0434)  morphine (PF) 2 MG/ML injection 2 mg (2 mg Intravenous Given 12/29/22 0148)                                                                                                                                     Procedures Procedures  (including critical care time)  Medical Decision Making / ED Course  Click here for ABCD2, HEART and other calculators  Medical Decision Making  Amount and/or Complexity of Data Reviewed Labs: ordered. Radiology: ordered.  Risk Prescription drug management.    This patient presents to the ED for concern of chest pain s/p fall or syncope, this involves an extensive number of treatment options, and is a complaint that carries with it a high risk of complications and morbidity. The differential diagnosis includes but not limited to will need to assess for evidence of trauma such as rib fractures.  Will also rule out pneumonia, ACS, metastatic disease.  Initial intervention:  morphine  Work up: [employer] and/or imaging independently interpreted by me) EKG without any acute changes Initial troponin elevated with slight trend up on the second CBC with leukocytosis.  Anemia with slight trend down from 2 months ago. Metabolic panel with evidence of AKI Chest x-ray concerning for cavitary lesion. CT head negative for ICH or mass CT cervical spine negative for fracture CT chest abdomen pelvis negative for any acute injuries.  It did confirm the 7 cm cavitary lesion.  Radiology also noted increased size of his AAA.  Reassessment: Comfortable and HDS Discussed admission with son. Also spoke about likely consult to palliative/hospice for home resources. They do not want to be placed in sNF. Spoke with Dr. Julian Reil who will arrange for admission for AKI. Elevated trops are likely demand or related to neoplastic process.       Final Clinical Impression(s) / ED Diagnoses Final diagnoses:  Chest wall pain  Cavitating mass in left upper lung  lobe  AKI (acute kidney injury)  Elevated troponin           This chart was dictated using voice recognition software.  Despite best efforts to proofread,  errors can occur which can change the documentation meaning.    Nira Conn, MD 12/29/22 [phone]

## 2022-12-29 NOTE — H&P (Addendum)
History and Physical    Patient: Jordan [name] YQM:[id number] DOB: 1917/12/04 DOA: 12/29/2022 DOS: the patient was seen and examined on 12/29/2022 PCP: Garlan Fillers, MD  Patient coming from: Home - lives alone; NOK: [place], [place], [phone]   Chief Complaint: Chest wall pain  HPI: Jordan [name] is a 89 y.o. male with medical history significant of AAA, BPH, CAD s/p stent, pacemaker, HTN, HLD, L renal mass, and s/p TAVR presenting with chest wall pain.  He reports coming in for chest discomfort, which is currently some better.  He says he has "a touch of pneumonia."  He reports living alone, with his son coming by periodically to check in on him.  He cannot see from his L eye (SCC) and does not hear well.  He is mostly nonambulatory.  I spoke with his son.  He takes him breakfast every morning because he stopped driving in January.  He can't see out of his L eye and is hard of hearing.  He basically "lives in his recliner."  He uses a walker/cane to get to the bathroom.  He is losing muscle tone in his legs.  He also calls in the evening and offers to bring dinner (he usually declines).  He called yesterday afternoon about 4 and he declined dinner.  His son called back to remind him to take evening meds, but he didn't answer.  He called maybe 10 times in the next couple of hours.  He came to check on him about 7pm and found him lying in the floor in the hallway.  He had a wound on his R shoulder with some bleeding, but nothing too serious.  He was unable to get him up, patient unable to help.  He finally got him into the recliner.  He was "incoherent", wouldn't talk to react.  He eventually seemed to clear cognitively.  His son stayed until about midnight.  They finally called 911 because he said he wanted to go to the ER.  He wasn't able to move his legs well enough to get to the bedside commode. He was complaining of upper chest discomfort around the collar bones.  He would like  him to be admitted with PT consultation.  He has a DNR - signed in 2021.  His son promised the patient that they would not put him in a home - but he needs more help than his son is capable of providing.  He is open to palliative care.      ER Course:  Carryover, per Dr. Julian Reil:  89 yr old with CP, WBC 17k, infiltrate with cavitary component x ray recd CT.  CT today showing primary bronchogenic carcinoma of lung with 7cm cavitary mass.  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  Found down by family at home, fell between 5 and 7pm.   Has AKI, has slight trop bump and aortic aneurysm 5.7cm (probably not candidate for intervention/repair given age and other issues).  Sounds like he may need hospice. Goal of care would be home with home hospice it sounds like from EDP discussion with family.      Review of Systems: As mentioned in the history of present illness. All other systems reviewed and are negative. Past Medical History:  Diagnosis Date   AAA (abdominal aortic aneurysm)    Anemia    Aortic stenosis    Arthritis 09/18/2011   "in my knees"   Basal cell carcinoma  of skin    Blood transfusion    BPH (benign prostatic hyperplasia)    CAD (coronary artery disease)    Remote stent to LAD in 1999. Last cath in 2004 and he is managed medically   Colon polyps    Complication of anesthesia    Depression    GERD (gastroesophageal reflux disease)    Hemorrhoid    History of radiation therapy 05/23/20-06/19/20   Radiation to scalp and RUE , Dr. 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   26 mm Edwards Sapien 3 transcatheter heart valve placed via open left transfemoral approach   Past Surgical History:  Procedure Laterality Date   BYPASS GRAFT ANGIOGRAPHY     CARDIAC CATHETERIZATION   09/15/2002   Managed medically   CARDIAC CATHETERIZATION     CARDIAC CATHETERIZATION N/A 04/11/2015   Procedure: Right/Left Heart Cath and Coronary Angiography;  Surgeon: Kathleene Hazel, MD;  Location: [facility] INVASIVE CV LAB;  Service: Cardiovascular;  Laterality: N/A;   CORONARY ANGIOGRAM  09/19/2011   Procedure: CORONARY ANGIOGRAM;  Surgeon: Wendall Stade, MD;  Location: [facility] CATH LAB;  Service: Cardiovascular;;   CORONARY ANGIOPLASTY WITH STENT PLACEMENT  05/09/1998   "1"; LAD   INSERT / REPLACE / REMOVE PACEMAKER  09/15/2002   initial placement; [employer]   INSERT / REPLACE / REMOVE PACEMAKER  02/14/2011   NASAL HEMORRHAGE CONTROL  05/17/1979   Clips placed to stop bleeding   PPM GENERATOR CHANGEOUT N/A 02/01/2021   Procedure: PPM GENERATOR CHANGEOUT;  Surgeon: Marinus Maw, MD;  Location: [facility] INVASIVE CV LAB;  Service: Cardiovascular;  Laterality: N/A;   SKIN CANCER EXCISION  09/18/2011   "have had a couple hundred of them removed since I was in my 40's"   TEE WITHOUT CARDIOVERSION N/A 05/15/2015   Procedure: TRANSESOPHAGEAL ECHOCARDIOGRAM (TEE);  Surgeon: Kathleene Hazel, MD;  Location[facility];  Service: Open Heart Surgery;  Laterality: N/A;   TONSILLECTOMY     "when I was a teenager"   TRANSCATHETER AORTIC VALVE REPLACEMENT, TRANSFEMORAL N/A 05/15/2015   Procedure: TRANSCATHETER AORTIC VALVE REPLACEMENT, TRANSFEMORAL;  Surgeon: Kathleene Hazel, MD;  Location: [facility] OR;  Service: Open Heart Surgery;  Laterality: N/A;   Social History:  reports that he quit smoking about 48 years ago. His smoking use included cigarettes. He has a 42.00 pack-year smoking history. He has quit using smokeless tobacco.  His smokeless tobacco use included chew. He reports that he does not drink alcohol and does not use drugs.  Allergies  Allergen Reactions   Aspirin Other (See Comments)    Results in stomach bleeds   Tape Other (See Comments)    SKIN IS THIN AND WILL TEAR EASILY!!    Nitroglycerin Other (See Comments)    Makes the patient faint    Family History  Problem Relation Age of Onset   Cancer Father        stomach   Colon cancer Father    Heart disease Brother    Heart disease Brother    Diabetes Other        granddaughter    Prior to Admission medications   Medication Sig Start Date End Date Taking? Authorizing Provider  acetaminophen (TYLENOL) 325 MG tablet Take 650 mg by  mouth every 6 (six) hours as needed for mild pain, moderate pain or fever.    Yes [provider]  amLODipine (NORVASC) 5 MG tablet TAKE 1 TABLET BY MOUTH EVERY DAY 09/16/22  Yes [place], Peter M, MD  atorvastatin (LIPITOR) 10 MG tablet Take 1 tablet (10 mg total) by mouth every evening. 02/24/22  Yes [place], Peter M, MD  clopidogrel (PLAVIX) 75 MG tablet Take 1 tablet by mouth daily.   Yes [provider]  cyanocobalamin (VITAMIN B12) 1000 MCG tablet Take 1,000 mcg by mouth daily.   Yes [provider]  finasteride (PROSCAR) 5 MG tablet Take 1 tablet by mouth daily.   Yes [provider]  pantoprazole (PROTONIX) 40 MG tablet Take 1 tablet (40 mg total) by mouth daily. 11/27/22  Yes Tressia Danas, MD  silodosin (RAPAFLO) 4 MG CAPS capsule Take 1 tablet by mouth daily.   Yes [provider]  torsemide (DEMADEX) 20 MG tablet Take 1 tablet (20 mg total) by mouth daily. 02/24/22  Yes [place], Peter M, MD    Physical Exam: Vitals:   12/29/22 1115 12/29/22 1130 12/29/22 1145 12/29/22 1200  BP:    115/74  Pulse: 83 74 87 91  Resp: (!) (!) 25  Temp:      TempSrc:      SpO2: 93% 97% 95% 91%  Weight:      Height:       General:  Appears calm and comfortable and is in NAD Eyes:  L eye appears enucleated related to fungating mass (SCC) ENT:   hard of hearing, grossly normal lips & tongue, mmm Neck:  no LAD, masses or thyromegaly Cardiovascular:  RRR, no m/r/g. No LE edema.  Respiratory:   CTA bilaterally with no wheezes/rales/rhonchi.   Normal respiratory effort. Abdomen:  soft, NT, ND Skin:  no rash or induration seen on limited exam other than SCC of L eye Musculoskeletal:  no bony abnormality, appears debilitated Psychiatric:  blunted mood and affect, speech fluent and appropriate, AOx3 Neurologic:  CN 2-12 grossly intact other than L eye, limited by immobility   Radiological Exams on Admission: Independently reviewed - see discussion in A/P where applicable  CT CHEST ABDOMEN PELVIS WO CONTRAST  Result Date: 12/29/2022 CLINICAL DATA:  Trauma/fall EXAM: CT CHEST, ABDOMEN AND PELVIS WITHOUT CONTRAST TECHNIQUE: Multidetector CT imaging of the chest, abdomen and pelvis was performed following the standard protocol without IV contrast. RADIATION DOSE REDUCTION: This exam was performed according to the departmental dose-optimization program which includes automated exposure control, adjustment of the mA and/or kV according to patient size and/or use of iterative reconstruction technique. COMPARISON:  CT abdomen/pelvis dated 06/22/2021 FINDINGS: CT CHEST FINDINGS Cardiovascular: The heart is top-normal in size. No pericardial effusion. Status post TAVR. No evidence of thoracic aortic aneurysm. Atherosclerotic calcifications of the aortic arch. Severe three-vessel coronary atherosclerosis. Right subclavian pacemaker. Mediastinum/Nodes: No evidence of anterior mediastinal hematoma. No suspicious mediastinal lymphadenopathy. Visualized thyroid is unremarkable. Lungs/Pleura: Bilobed cavitary mass in the left upper lobe, measuring 4.3 x 4.9 x 7.1 cm, highly suspicious for primary bronchogenic neoplasm, possibly squamous cell carcinoma. Mild dependent atelectasis in the posterior right upper lobe and bilateral lower lobes, right greater than left. No focal consolidation. No pleural effusion or pneumothorax. Musculoskeletal: Moderate compression fracture deformities of T6, T8, and T9, age indeterminate but likely chronic. Multiple old bilateral  rib fracture deformities, without acute fracture. CT ABDOMEN PELVIS FINDINGS Hepatobiliary: Unenhanced liver is unremarkable. Layering small gallstones (series  2/image 46), without associated inflammatory changes. No intrahepatic or extrahepatic duct dilatation. Pancreas: Within normal limits. Spleen: Within normal limits. Adrenals/Urinary Tract: Adrenal glands are within normal limits. Multiple bilateral renal cysts, measuring up to 5.2 cm in the left lower kidney (series 2/image 53), benign (Bosniak I). No follow-up is recommended. No hydronephrosis. Thick-walled, trabeculated bladder, suggesting chronic bladder outlet obstruction. Stomach/Bowel: Stomach is within normal limits. No evidence of bowel obstruction. Appendix is not discretely visualized. Mild left colonic diverticulosis, without evidence of diverticulitis. Vascular/Lymphatic: 5.7 cm infrarenal abdominal aortic aneurysm (series 2/image 65), previously 5.4 cm. Atherosclerotic calcifications of the abdominal aorta and branch vessels. No suspicious abdominopelvic lymphadenopathy. Reproductive: Prostatomegaly, suggesting BPH. Other: No abdominopelvic ascites. Musculoskeletal: Mild degenerative changes of the lumbar spine. Subacute/healing right inferior pubic ramus fracture. IMPRESSION: No acute traumatic injury to the chest, abdomen, or pelvis. 7.1 cm cavitary left upper lobe mass, highly suspicious for primary bronchogenic neoplasm, possibly squamous cell carcinoma. 5.7 cm infrarenal abdominal aortic aneurysm, previously 5.4 cm. Consider vascular consultation as clinically warranted given the patient's age. Additional ancillary findings as above. Electronically Signed   By: Charline Bills M.D.   On: 12/29/2022 04:14   CT Head Wo Contrast  Result Date: 12/29/2022 CLINICAL DATA:  Fall. EXAM: CT HEAD WITHOUT CONTRAST CT CERVICAL SPINE WITHOUT CONTRAST TECHNIQUE: Multidetector CT imaging of the head and cervical spine was performed following the  standard protocol without intravenous contrast. Multiplanar CT image reconstructions of the cervical spine were also generated. RADIATION DOSE REDUCTION: This exam was performed according to the departmental dose-optimization program which includes automated exposure control, adjustment of the mA and/or kV according to patient size and/or use of iterative reconstruction technique. COMPARISON:  None Available. FINDINGS: CT HEAD FINDINGS Brain: No evidence of acute infarction, hemorrhage, hydrocephalus, extra-axial collection or mass lesion/mass effect. Vascular: No hyperdense vessel or unexpected calcification. Skull: Normal. Negative for fracture or focal lesion. Sinuses/Orbits: Infiltrative soft tissue density mass around the left orbit, on reformats infiltrating into the soft tissues above the globe including the extraocular muscles. Other: Extensive streak artifact from positioning CT CERVICAL SPINE FINDINGS Alignment: Positional distortion.  No traumatic malalignment. Skull base and vertebrae: No acute fracture Soft tissues and spinal canal: No prevertebral fluid or swelling. No visible canal hematoma. Disc levels:  Limited degenerative changes for age. Upper chest: Reported separately IMPRESSION: 1. No evidence of acute intracranial or cervical spine injury. 2. Infiltrative mass at the left orbit with deep extension above the globe. Electronically Signed   By: Tiburcio Pea M.D.   On: 12/29/2022 04:08   CT Cervical Spine Wo Contrast  Result Date: 12/29/2022 CLINICAL DATA:  Fall. EXAM: CT HEAD WITHOUT CONTRAST CT CERVICAL SPINE WITHOUT CONTRAST TECHNIQUE: Multidetector CT imaging of the head and cervical spine was performed following the standard protocol without intravenous contrast. Multiplanar CT image reconstructions of the cervical spine were also generated. RADIATION DOSE REDUCTION: This exam was performed according to the departmental dose-optimization program which includes automated exposure  control, adjustment of the mA and/or kV according to patient size and/or use of iterative reconstruction technique. COMPARISON:  None Available. FINDINGS: CT HEAD FINDINGS Brain: No evidence of acute infarction, hemorrhage, hydrocephalus, extra-axial collection or mass lesion/mass effect. Vascular: No hyperdense vessel or unexpected calcification. Skull: Normal. Negative for fracture or focal lesion. Sinuses/Orbits: Infiltrative soft tissue density mass around the left orbit, on reformats infiltrating into the soft tissues above the globe including the extraocular muscles. Other: Extensive streak artifact from positioning CT CERVICAL SPINE FINDINGS Alignment:  Positional distortion.  No traumatic malalignment. Skull base and vertebrae: No acute fracture Soft tissues and spinal canal: No prevertebral fluid or swelling. No visible canal hematoma. Disc levels:  Limited degenerative changes for age. Upper chest: Reported separately IMPRESSION: 1. No evidence of acute intracranial or cervical spine injury. 2. Infiltrative mass at the left orbit with deep extension above the globe. Electronically Signed   By: Tiburcio Pea M.D.   On: 12/29/2022 04:08   DG Chest 2 View  Result Date: 12/29/2022 CLINICAL DATA:  Anterior chest wall pain after fall yesterday. Low oxygen saturations. EXAM: CHEST - 2 VIEW COMPARISON:  Radiographs 10/25/2022. FINDINGS: The chin obscures the upper lungs. Stable enlargement of the cardiomediastinal silhouette. TAVR. Right chest wall pacemaker. Patchy opacity in the left mid lung is similar to 10/25/2022. Trace left pleural effusion. No definite pneumothorax. No definite displaced rib fractures. Similar wedging of a few midthoracic vertebral bodies. IMPRESSION: Patchy airspace opacity in the left mid lung is similar to 10/25/2022. There is some central lucency suspicious for cavitation. Chest CT is recommended for further evaluation. Electronically Signed   By: Minerva Fester M.D.   On:  12/29/2022 02:11    EKG: Independently reviewed.  NSR with rate 70, bigeminy; LBBB with no evidence of acute ischemia   Labs on Admission: I have personally reviewed the available labs and imaging studies at the time of the admission.  Pertinent labs:    Glucose 134 BUN 40/Creatinine 2.17/GFR 26; 27/1.38/45 on 10/25/22 HS troponin 129, 144 WBC 17.1 Hgb 7.4, 8.7 on 2/10   Assessment and Plan: Principal Problem:   Fall at home, initial encounter Active Problems:   Hyperlipemia, mixed   Anemia   Coronary atherosclerosis   Pacemaker   Cavitating mass in left lower lung lobe   Chest wall pain   AAA (abdominal aortic aneurysm) without rupture   HTN (hypertension)   CKD (chronic kidney disease), stage III   AKI (acute kidney injury)   DNR (do not resuscitate)    Fall at home -Patient is 13 and lives at home alone -He fell and was down for probably a couple of hours yesterday -Imaging negative for injury but with other findings (see below) -GOC discussions and consideration of placement are of utmost importance -Patient is essentially non-ambulatory and is likely not safe to continue to remain at home without full-time caregivers; son previously promised him he could remain at home so this may be a challenge (son also reports that the patient cannot live with him) -Palliative care consulted  Chest pain -Patient with what appears to be non-ischemic CP -Mild troponin elevation with negative delta, likely related to demand ischemia -This pain is likely related to his fall but also could be due to apparent lung cancer -Has underlying known CAD and severe disease was seen on imaging today -Even if this was ACS, he would likely not be a candidate for intervention -Will monitor without intervention at this time -Continue Plavix -Has pacemaker  AKI on stage 3a CKD -Gentle IVF hydration for now -Recheck BMP in AM -Avoid nephrotoxic agents if possible  Anemia -Worsening -This  is challenging because he is approaching transfusion levels -However, he is likely not a candidate to evaluate the cause of the bleeding invasively -Therefore, even with transfusion he would be at risk for recurrence and so transfusion risk may be greater than the risk of not transfusing -Will defer this issue currently and await palliative care input  Lung cancer -Imaging today initially concerning  for PNA -CT does not show apparent infection but does show a large cavitary mass that is likely SCC or other primary lung cancer -He does not appear to be a candidate for treatment and likely needs palliative care/hospice -Ongoing discussions are needed with patient/family  AAA -Known AAA, enlarging -This is concerning as it is at increasing risk for fatal rupture -He does not appear to be a candidate for intervention  HTN -Continue amlodipine  HLD -Continue atorvastatin  BPH -Continue finasteride, silodosin  L eye blindness -Has SCC and elected for no treatment -Appears to be enucleated at this time  DNR -I have discussed code status with the patient and his son - the patient was unable to effectively answer the question and his son believes that he would prefer to die a natural death should that situation arise. -He will need a gold out of facility DNR form at the time of discharge      Advance Care Planning:   Code Status: DNR   Consults: Palliative care; TOC team; nutrition; PT/OT  DVT Prophylaxis: Lovenox  Family Communication: None present; I spoke with his son by telephone at the time of admission  Severity of Illness: The appropriate patient status for this patient is INPATIENT. Inpatient status is judged to be reasonable and necessary in order to provide the required intensity of service to ensure the patient's safety. The patient's presenting symptoms, physical exam findings, and initial radiographic and laboratory data in the context of their chronic comorbidities  is felt to place them at high risk for further clinical deterioration. Furthermore, it is not anticipated that the patient will be medically stable for discharge from the hospital within 2 midnights of admission.   * I certify that at the point of admission it is my clinical judgment that the patient will require inpatient hospital care spanning beyond 2 midnights from the point of admission due to high intensity of service, high risk for further deterioration and high frequency of surveillance required.*  Author: Jonah Blue, MD 12/29/2022 5:33 PM  For on call review [email or web address].

## 2022-12-29 NOTE — ED Notes (Signed)
ED TO INPATIENT HANDOFF REPORT  ED Nurse Name and Phone #: Donnella Bi [phone]  S Name/Age/Gender Jordan [name] 89 y.o. male Room/Bed: 040C/040C  Code Status   Code Status: DNR  Home/SNF/Other home Patient oriented to: self, place, time, and situation Is this baseline? Yes   Triage Complete: Triage complete  Chief Complaint Left upper lobe pneumonia [J18.9]  Triage Note Pt to ED via EMS from home. Pt c/o interior chest wall pain. Pt also had fall yesterday per son. Pt's son found pt on floor. Pt was trying to use bathroom when he fell. Unwitnessed fall. Pt denies hitting head. Pt on plavix. Pt placed on 2L [place] with EMS. Pt also c/o being cold. Pt has hx of dementia.   Hx of pacemaker  EMS Vitals: 100/48 66 HR 18 RR 92% 2L [place]   Allergies Allergies  Allergen Reactions   Aspirin Other (See Comments)    Results in stomach bleeds   Tape Other (See Comments)    SKIN IS THIN AND WILL TEAR EASILY!!   Nitroglycerin Other (See Comments)    Makes the patient faint    Level of Care/Admitting Diagnosis ED Disposition     ED Disposition  Admit   Condition  --   Comment  Hospital Area: [facility] [[id number]]  Level of Care: Med-Surg [16]  May admit patient to [facility] or [facility] if equivalent level of care is available:: Yes  Covid Evaluation: Asymptomatic - no recent exposure (last 10 days) testing not required  Diagnosis: Left upper lobe pneumonia [454098]  Admitting Physician: Magnus Ivan [[id number]]  Attending Physician: Nira Conn [[id number]]  Certification:: I certify this patient will need inpatient services for at least 2 midnights  Estimated Length of Stay: 5          B Medical/Surgery History Past Medical History:  Diagnosis Date   AAA (abdominal aortic aneurysm)    Anemia    Aortic stenosis    Arthritis 09/18/2011   "in my knees"   Basal cell carcinoma of skin    Blood transfusion    BPH (benign  prostatic hyperplasia)    CAD (coronary artery disease)    Remote stent to LAD in 1999. Last cath in 2004 and he is managed medically   Colon polyps    Complication of anesthesia    Depression    GERD (gastroesophageal reflux disease)    Hemorrhoid    History of radiation therapy 05/23/20-06/19/20   Radiation to scalp and RUE , Dr. 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   26 mm Edwards Sapien 3 transcatheter heart valve placed via open left transfemoral approach   Past Surgical History:  Procedure Laterality Date   BYPASS GRAFT ANGIOGRAPHY     CARDIAC CATHETERIZATION  09/15/2002   Managed medically   CARDIAC CATHETERIZATION     CARDIAC CATHETERIZATION N/A 04/11/2015   Procedure: Right/Left Heart Cath and Coronary Angiography;  Surgeon: Kathleene Hazel, MD;  Location: [facility] INVASIVE CV LAB;  Service: Cardiovascular;  Laterality: N/A;   CORONARY ANGIOGRAM  09/19/2011   Procedure: CORONARY ANGIOGRAM;  Surgeon: Wendall Stade, MD;  Location: [facility] CATH LAB;  Service: Cardiovascular;;   CORONARY ANGIOPLASTY WITH STENT PLACEMENT  05/09/1998   "1"; LAD  INSERT / REPLACE / REMOVE PACEMAKER  09/15/2002   initial placement; [employer]   INSERT / REPLACE / REMOVE PACEMAKER  02/14/2011   NASAL HEMORRHAGE CONTROL  05/17/1979   Clips placed to stop bleeding   PPM GENERATOR CHANGEOUT N/A 02/01/2021   Procedure: PPM GENERATOR CHANGEOUT;  Surgeon: Marinus Maw, MD;  Location: [facility] INVASIVE CV LAB;  Service: Cardiovascular;  Laterality: N/A;   SKIN CANCER EXCISION  09/18/2011   "have had a couple hundred of them removed since I was in my 40's"   TEE WITHOUT CARDIOVERSION N/A 05/15/2015   Procedure: TRANSESOPHAGEAL ECHOCARDIOGRAM (TEE);  Surgeon: Kathleene Hazel, MD;  Location: [facility] OR;  Service: Open Heart Surgery;   Laterality: N/A;   TONSILLECTOMY     "when I was a teenager"   TRANSCATHETER AORTIC VALVE REPLACEMENT, TRANSFEMORAL N/A 05/15/2015   Procedure: TRANSCATHETER AORTIC VALVE REPLACEMENT, TRANSFEMORAL;  Surgeon: Kathleene Hazel, MD;  Location: [facility] OR;  Service: Open Heart Surgery;  Laterality: N/A;     A IV Location/Drains/Wounds Patient Lines/Drains/Airways Status     Active Line/Drains/Airways     Name Placement date Placement time Site Days   Peripheral IV 12/29/22 20 G 1" Anterior;Left Forearm 12/29/22  0147  Forearm  less than 1   External Urinary Catheter 12/29/22  0953  --  less than 1   Pressure Injury 06/27/21 Sacrum Anterior Stage 1 -  Intact skin with non-blanchable redness of a localized area usually over a bony prominence. 06/27/21  2000  -- 550            Intake/Output Last 24 hours  Intake/Output Summary (Last 24 hours) at 12/29/2022 1233 Last data filed at 12/29/2022 0435 Gross per 24 hour  Intake 1000 ml  Output --  Net 1000 ml    Labs/Imaging Results for orders placed or performed during the hospital encounter of 12/29/22 (from the past 48 hour(s))  CBC with Differential     Status: Abnormal   Collection Time: 12/29/22  1:49 AM  Result Value Ref Range   WBC 17.1 (H) 4.0 - 10.5 K/uL   RBC 2.96 (L) 4.22 - 5.81 MIL/uL   Hemoglobin 7.4 (L) 13.0 - 17.0 g/dL   HCT 40.9 (L) 81.1 - 91.4 %   MCV 85.5 80.0 - 100.0 fL   MCH 25.0 (L) 26.0 - 34.0 pg   MCHC 29.2 (L) 30.0 - 36.0 g/dL   RDW 78.2 95.6 - 21.3 %   Platelets 252 150 - 400 K/uL   nRBC 0.0 0.0 - 0.2 %   Neutrophils Relative % 93 %   Neutro Abs 15.8 (H) 1.7 - 7.7 K/uL   Lymphocytes Relative 2 %   Lymphs Abs 0.3 (L) 0.7 - 4.0 K/uL   Monocytes Relative 5 %   Monocytes Absolute 0.9 0.1 - 1.0 K/uL   Eosinophils Relative 0 %   Eosinophils Absolute 0.0 0.0 - 0.5 K/uL   Basophils Relative 0 %   Basophils Absolute 0.0 0.0 - 0.1 K/uL   Immature Granulocytes 0 %   Abs Immature Granulocytes 0.07 0.00 -  0.07 K/uL    Comment: Performed at [facility], [address], [place], [place] [address]  Comprehensive metabolic panel     Status: Abnormal   Collection Time: 12/29/22  1:49 AM  Result Value Ref Range   Sodium 138 135 - 145 mmol/L   Potassium 4.2 3.5 - 5.1 mmol/L   Chloride 96 (L) 98 - 111 mmol/L   CO2 27  22 - 32 mmol/L   Glucose, Bld 134 (H) 70 - 99 mg/dL    Comment: Glucose reference range applies only to samples taken after fasting for at least 8 hours.   BUN 40 (H) 8 - 23 mg/dL   Creatinine, Ser 1.61 (H) 0.61 - 1.24 mg/dL   Calcium 9.5 8.9 - 09.6 mg/dL   Total Protein 7.1 6.5 - 8.1 g/dL   Albumin 2.5 (L) 3.5 - 5.0 g/dL   AST 33 15 - 41 U/L   ALT 19 0 - 44 U/L   Alkaline Phosphatase 65 38 - 126 U/L   Total Bilirubin 0.8 0.3 - 1.2 mg/dL   GFR, Estimated 26 (L) >60 mL/min    Comment: (NOTE) Calculated using the CKD-EPI Creatinine Equation (2021)    Anion gap 15 5 - 15    Comment: Performed at [facility], [address] [place], [place] [address]  Troponin I (High Sensitivity)     Status: Abnormal   Collection Time: 12/29/22  1:49 AM  Result Value Ref Range   Troponin I (High Sensitivity) 129 (HH) <18 ng/L    Comment: CRITICAL RESULT CALLED TO, READ BACK BY AND VERIFIED WITH K. BLANCH RN 12/29/22  BY J. WHITE (NOTE) Elevated high sensitivity troponin I (hsTnI) values and significant  changes across serial measurements may suggest ACS but many other  chronic and acute conditions are known to elevate hsTnI results.  Refer to the "Links" section for chest pain algorithms and additional  guidance. Performed at [facility], [address], [place], [place] [address]   POC CBG, ED     Status: Abnormal   Collection Time: 12/29/22  1:54 AM  Result Value Ref Range   Glucose-Capillary 117 (H) 70 - 99 mg/dL    Comment: Glucose reference range applies only to samples taken after fasting for at least 8 hours.  I-stat chem 8, ED (not at [facility], [facility] or [facility])      Status: Abnormal   Collection Time: 12/29/22  1:54 AM  Result Value Ref Range   Sodium 138 135 - 145 mmol/L   Potassium 4.2 3.5 - 5.1 mmol/L   Chloride 98 98 - 111 mmol/L   BUN 37 (H) 8 - 23 mg/dL   Creatinine, Ser 1.47 (H) 0.61 - 1.24 mg/dL   Glucose, Bld 829 (H) 70 - 99 mg/dL    Comment: Glucose reference range applies only to samples taken after fasting for at least 8 hours.   Calcium, Ion 1.17 1.15 - 1.40 mmol/L   TCO2 31 22 - 32 mmol/L   Hemoglobin 8.5 (L) 13.0 - 17.0 g/dL   HCT 56.2 (L) 13.0 - 86.5 %  Troponin I (High Sensitivity)     Status: Abnormal   Collection Time: 12/29/22  3:39 AM  Result Value Ref Range   Troponin I (High Sensitivity) 144 (HH) <18 ng/L    Comment: CRITICAL VALUE NOTED. VALUE IS CONSISTENT WITH PREVIOUSLY REPORTED/CALLED VALUE (NOTE) Elevated high sensitivity troponin I (hsTnI) values and significant  changes across serial measurements may suggest ACS but many other  chronic and acute conditions are known to elevate hsTnI results.  Refer to the "Links" section for chest pain algorithms and additional  guidance. Performed at [facility], [address] [place], [place] [address]    CT CHEST ABDOMEN PELVIS WO CONTRAST  Result Date: 12/29/2022 CLINICAL DATA:  Trauma/fall EXAM: CT CHEST, ABDOMEN AND PELVIS WITHOUT CONTRAST TECHNIQUE: Multidetector CT imaging of  the chest, abdomen and pelvis was performed following the standard protocol without IV contrast. RADIATION DOSE REDUCTION: This exam was performed according to the departmental dose-optimization program which includes automated exposure control, adjustment of the mA and/or kV according to patient size and/or use of iterative reconstruction technique. COMPARISON:  CT abdomen/pelvis dated 06/22/2021 FINDINGS: CT CHEST FINDINGS Cardiovascular: The heart is top-normal in size. No pericardial effusion. Status post TAVR. No evidence of thoracic aortic aneurysm. Atherosclerotic calcifications of the  aortic arch. Severe three-vessel coronary atherosclerosis. Right subclavian pacemaker. Mediastinum/Nodes: No evidence of anterior mediastinal hematoma. No suspicious mediastinal lymphadenopathy. Visualized thyroid is unremarkable. Lungs/Pleura: Bilobed cavitary mass in the left upper lobe, measuring 4.3 x 4.9 x 7.1 cm, highly suspicious for primary bronchogenic neoplasm, possibly squamous cell carcinoma. Mild dependent atelectasis in the posterior right upper lobe and bilateral lower lobes, right greater than left. No focal consolidation. No pleural effusion or pneumothorax. Musculoskeletal: Moderate compression fracture deformities of T6, T8, and T9, age indeterminate but likely chronic. Multiple old bilateral rib fracture deformities, without acute fracture. CT ABDOMEN PELVIS FINDINGS Hepatobiliary: Unenhanced liver is unremarkable. Layering small gallstones (series 2/image 46), without associated inflammatory changes. No intrahepatic or extrahepatic duct dilatation. Pancreas: Within normal limits. Spleen: Within normal limits. Adrenals/Urinary Tract: Adrenal glands are within normal limits. Multiple bilateral renal cysts, measuring up to 5.2 cm in the left lower kidney (series 2/image 53), benign (Bosniak I). No follow-up is recommended. No hydronephrosis. Thick-walled, trabeculated bladder, suggesting chronic bladder outlet obstruction. Stomach/Bowel: Stomach is within normal limits. No evidence of bowel obstruction. Appendix is not discretely visualized. Mild left colonic diverticulosis, without evidence of diverticulitis. Vascular/Lymphatic: 5.7 cm infrarenal abdominal aortic aneurysm (series 2/image 65), previously 5.4 cm. Atherosclerotic calcifications of the abdominal aorta and branch vessels. No suspicious abdominopelvic lymphadenopathy. Reproductive: Prostatomegaly, suggesting BPH. Other: No abdominopelvic ascites. Musculoskeletal: Mild degenerative changes of the lumbar spine. Subacute/healing right  inferior pubic ramus fracture. IMPRESSION: No acute traumatic injury to the chest, abdomen, or pelvis. 7.1 cm cavitary left upper lobe mass, highly suspicious for primary bronchogenic neoplasm, possibly squamous cell carcinoma. 5.7 cm infrarenal abdominal aortic aneurysm, previously 5.4 cm. Consider vascular consultation as clinically warranted given the patient's age. Additional ancillary findings as above. Electronically Signed   By: Charline Bills M.D.   On: 12/29/2022 04:14   CT Head Wo Contrast  Result Date: 12/29/2022 CLINICAL DATA:  Fall. EXAM: CT HEAD WITHOUT CONTRAST CT CERVICAL SPINE WITHOUT CONTRAST TECHNIQUE: Multidetector CT imaging of the head and cervical spine was performed following the standard protocol without intravenous contrast. Multiplanar CT image reconstructions of the cervical spine were also generated. RADIATION DOSE REDUCTION: This exam was performed according to the departmental dose-optimization program which includes automated exposure control, adjustment of the mA and/or kV according to patient size and/or use of iterative reconstruction technique. COMPARISON:  None Available. FINDINGS: CT HEAD FINDINGS Brain: No evidence of acute infarction, hemorrhage, hydrocephalus, extra-axial collection or mass lesion/mass effect. Vascular: No hyperdense vessel or unexpected calcification. Skull: Normal. Negative for fracture or focal lesion. Sinuses/Orbits: Infiltrative soft tissue density mass around the left orbit, on reformats infiltrating into the soft tissues above the globe including the extraocular muscles. Other: Extensive streak artifact from positioning CT CERVICAL SPINE FINDINGS Alignment: Positional distortion.  No traumatic malalignment. Skull base and vertebrae: No acute fracture Soft tissues and spinal canal: No prevertebral fluid or swelling. No visible canal hematoma. Disc levels:  Limited degenerative changes for age. Upper chest: Reported separately IMPRESSION: 1. No  evidence of  acute intracranial or cervical spine injury. 2. Infiltrative mass at the left orbit with deep extension above the globe. Electronically Signed   By: Tiburcio Pea M.D.   On: 12/29/2022 04:08   CT Cervical Spine Wo Contrast  Result Date: 12/29/2022 CLINICAL DATA:  Fall. EXAM: CT HEAD WITHOUT CONTRAST CT CERVICAL SPINE WITHOUT CONTRAST TECHNIQUE: Multidetector CT imaging of the head and cervical spine was performed following the standard protocol without intravenous contrast. Multiplanar CT image reconstructions of the cervical spine were also generated. RADIATION DOSE REDUCTION: This exam was performed according to the departmental dose-optimization program which includes automated exposure control, adjustment of the mA and/or kV according to patient size and/or use of iterative reconstruction technique. COMPARISON:  None Available. FINDINGS: CT HEAD FINDINGS Brain: No evidence of acute infarction, hemorrhage, hydrocephalus, extra-axial collection or mass lesion/mass effect. Vascular: No hyperdense vessel or unexpected calcification. Skull: Normal. Negative for fracture or focal lesion. Sinuses/Orbits: Infiltrative soft tissue density mass around the left orbit, on reformats infiltrating into the soft tissues above the globe including the extraocular muscles. Other: Extensive streak artifact from positioning CT CERVICAL SPINE FINDINGS Alignment: Positional distortion.  No traumatic malalignment. Skull base and vertebrae: No acute fracture Soft tissues and spinal canal: No prevertebral fluid or swelling. No visible canal hematoma. Disc levels:  Limited degenerative changes for age. Upper chest: Reported separately IMPRESSION: 1. No evidence of acute intracranial or cervical spine injury. 2. Infiltrative mass at the left orbit with deep extension above the globe. Electronically Signed   By: Tiburcio Pea M.D.   On: 12/29/2022 04:08   DG Chest 2 View  Result Date: 12/29/2022 CLINICAL DATA:   Anterior chest wall pain after fall yesterday. Low oxygen saturations. EXAM: CHEST - 2 VIEW COMPARISON:  Radiographs 10/25/2022. FINDINGS: The chin obscures the upper lungs. Stable enlargement of the cardiomediastinal silhouette. TAVR. Right chest wall pacemaker. Patchy opacity in the left mid lung is similar to 10/25/2022. Trace left pleural effusion. No definite pneumothorax. No definite displaced rib fractures. Similar wedging of a few midthoracic vertebral bodies. IMPRESSION: Patchy airspace opacity in the left mid lung is similar to 10/25/2022. There is some central lucency suspicious for cavitation. Chest CT is recommended for further evaluation. Electronically Signed   By: Minerva Fester M.D.   On: 12/29/2022 02:11    Pending Labs Unresulted Labs (From admission, onward)     Start     Ordered   12/30/22 0500  Basic metabolic panel  Tomorrow morning,   R        12/29/22 1014   12/30/22 0500  CBC  Tomorrow morning,   R        12/29/22 1014            Vitals/Pain Today's Vitals   12/29/22 1115 12/29/22 1130 12/29/22 1145 12/29/22 1200  BP:    115/74  Pulse: 83 74 87 91  Resp: (!) (!) 25  Temp:      TempSrc:      SpO2: 93% 97% 95% 91%  Weight:      Height:      PainSc:        Isolation Precautions No active isolations  Medications Medications  amLODipine (NORVASC) tablet 5 mg (5 mg Oral Given 12/29/22 1131)  atorvastatin (LIPITOR) tablet 10 mg (has no administration in time range)  pantoprazole (PROTONIX) EC tablet 40 mg (40 mg Oral Given 12/29/22 1133)  finasteride (PROSCAR) tablet 5 mg (5 mg Oral Given 12/29/22 1132)  tamsulosin (FLOMAX) capsule 0.4 mg (0.4 mg Oral Given 12/29/22 1133)  clopidogrel (PLAVIX) tablet 75 mg (75 mg Oral Given 12/29/22 1132)  enoxaparin (LOVENOX) injection 30 mg (has no administration in time range)  lactated ringers infusion ( Intravenous New Bag/Given 12/29/22 1133)  acetaminophen (TYLENOL) tablet 650 mg (has no administration in time  range)    Or  acetaminophen (TYLENOL) suppository 650 mg (has no administration in time range)  oxyCODONE (Oxy IR/ROXICODONE) immediate release tablet 5 mg (has no administration in time range)  docusate sodium (COLACE) capsule 100 mg (100 mg Oral Given 12/29/22 1132)  polyethylene glycol (MIRALAX / GLYCOLAX) packet 17 g (has no administration in time range)  bisacodyl (DULCOLAX) EC tablet 5 mg (has no administration in time range)  ondansetron (ZOFRAN) tablet 4 mg (has no administration in time range)    Or  ondansetron (ZOFRAN) injection 4 mg (has no administration in time range)  albuterol (PROVENTIL) (2.5 MG/3ML) 0.083% nebulizer solution 2.5 mg (has no administration in time range)  hydrALAZINE (APRESOLINE) injection 5 mg (has no administration in time range)  morphine (PF) 2 MG/ML injection 2 mg (2 mg Intravenous Given 12/29/22 0148)  sodium chloride 0.9 % bolus 1,000 mL (0 mLs Intravenous Stopped 12/29/22 0435)  morphine (PF) 2 MG/ML injection 2 mg (2 mg Intravenous Given 12/29/22 0728)    Mobility walks with device     Focused Assessments Pulmonary Assessment Handoff:  Lung sounds:   O2 Device: Nasal Cannula O2 Flow Rate (L/min): 2 L/min    R Recommendations: See Admitting Provider Note  Report given to:   Additional Notes: .

## 2022-12-29 NOTE — Progress Notes (Signed)
Patient to 2W15 at this time

## 2022-12-29 NOTE — ED Triage Notes (Signed)
Pt to ED via EMS from home. Pt c/o interior chest wall pain. Pt also had fall yesterday per son. Pt's son found pt on floor. Pt was trying to use bathroom when he fell. Unwitnessed fall. Pt denies hitting head. Pt on plavix. Pt placed on 2L [place] with EMS. Pt also c/o being cold. Pt has hx of dementia.   Hx of pacemaker  EMS Vitals: 100/48 66 HR 18 RR 92% 2L [place]

## 2022-12-29 NOTE — Plan of Care (Signed)

## 2022-12-29 NOTE — ED Notes (Signed)
Pt o2 dropped to 85, pt placed on 2L oxygen

## 2022-12-29 NOTE — ED Notes (Signed)
Pt has scattered rash/skin disorder from head to toe, pt's left eye is closed, scabbed over, red, blistered and draining thick serosanguineous drainage. Pt admits to having a sore/wound on his bottom but refuses to allow it to be assessed at this time, unable to assess or stage wound at this time, Marylene Land S at bedside.

## 2022-12-29 NOTE — Progress Notes (Signed)
Skin assessment done upon patient's arrival to unit.  Patient noted to have several lesions to left side of face, especially around the left eye.  Area's noted to be red/black in color with a large intact blister noted around the eye as well.  Patient has several similar area's all over his body along with bruising and erythema with small amount of sanguinous drainage from several of these areas noted.  Large intact blister also noted to right elbow.  Foam dressings applied to bilateral elbows and to sacrum.

## 2022-12-29 NOTE — ED Notes (Signed)
Pt medicated for chest wall pain.

## 2022-12-30 DIAGNOSIS — N179 Acute kidney failure, unspecified: Principal | ICD-10-CM

## 2022-12-30 DIAGNOSIS — W19XXXA Unspecified fall, initial encounter: Secondary | ICD-10-CM | POA: Diagnosis not present

## 2022-12-30 DIAGNOSIS — D649 Anemia, unspecified: Secondary | ICD-10-CM | POA: Diagnosis not present

## 2022-12-30 DIAGNOSIS — E782 Mixed hyperlipidemia: Secondary | ICD-10-CM

## 2022-12-30 DIAGNOSIS — J984 Other disorders of lung: Secondary | ICD-10-CM | POA: Diagnosis not present

## 2022-12-30 DIAGNOSIS — I2510 Atherosclerotic heart disease of native coronary artery without angina pectoris: Secondary | ICD-10-CM

## 2022-12-30 DIAGNOSIS — Z950 Presence of cardiac pacemaker: Secondary | ICD-10-CM

## 2022-12-30 DIAGNOSIS — I714 Abdominal aortic aneurysm, without rupture, unspecified: Secondary | ICD-10-CM

## 2022-12-30 DIAGNOSIS — Z66 Do not resuscitate: Secondary | ICD-10-CM

## 2022-12-30 DIAGNOSIS — R0789 Other chest pain: Secondary | ICD-10-CM | POA: Diagnosis not present

## 2022-12-30 DIAGNOSIS — I10 Essential (primary) hypertension: Secondary | ICD-10-CM

## 2022-12-30 LAB — TYPE AND SCREEN
ABO/RH(D): O NEG
Antibody Screen: NEGATIVE

## 2022-12-30 LAB — CBC
HCT: 21.9 % — ABNORMAL LOW (ref 39.0–52.0)
Hemoglobin: 6.7 g/dL — CL (ref 13.0–17.0)
MCH: 25.5 pg — ABNORMAL LOW (ref 26.0–34.0)
MCHC: 30.6 g/dL (ref 30.0–36.0)
MCV: 83.3 fL (ref 80.0–100.0)
Platelets: 205 10*3/uL (ref 150–400)
RBC: 2.63 MIL/uL — ABNORMAL LOW (ref 4.22–5.81)
RDW: 15 % (ref 11.5–15.5)
WBC: 11.4 10*3/uL — ABNORMAL HIGH (ref 4.0–10.5)
nRBC: 0 % (ref 0.0–0.2)

## 2022-12-30 LAB — BASIC METABOLIC PANEL
Anion gap: 8 (ref 5–15)
BUN: 33 mg/dL — ABNORMAL HIGH (ref 8–23)
CO2: 26 mmol/L (ref 22–32)
Calcium: 8.9 mg/dL (ref 8.9–10.3)
Chloride: 103 mmol/L (ref 98–111)
Creatinine, Ser: 1.51 mg/dL — ABNORMAL HIGH (ref 0.61–1.24)
GFR, Estimated: 40 mL/min — ABNORMAL LOW (ref >60)
Glucose, Bld: 88 mg/dL (ref 70–99)
Potassium: 4.2 mmol/L (ref 3.5–5.1)
Sodium: 137 mmol/L (ref 135–145)

## 2022-12-30 LAB — HEMOGLOBIN AND HEMATOCRIT, BLOOD
HCT: 22.3 % — ABNORMAL LOW (ref 39.0–52.0)
Hemoglobin: 6.5 g/dL — CL (ref 13.0–17.0)

## 2022-12-30 MED ORDER — ENSURE ENLIVE PO LIQD: 237.0000 mL | Freq: Two times a day (BID) | ORAL | Status: DC

## 2022-12-30 MED ORDER — HALOPERIDOL LACTATE 5 MG/ML IJ SOLN
2.0000 mg | Freq: Four times a day (QID) | INTRAMUSCULAR | Status: DC | PRN
  Administered 2022-12-31: 2 mg via INTRAVENOUS
  Filled 2022-12-30: qty 1

## 2022-12-30 MED ORDER — HYDROMORPHONE HCL 1 MG/ML IJ SOLN: 0.2500 mg | INTRAMUSCULAR | Status: DC | PRN

## 2022-12-30 MED ORDER — POLYVINYL ALCOHOL 1.4 % OP SOLN: 1.0000 [drp] | Freq: Four times a day (QID) | OPHTHALMIC | Status: DC | PRN

## 2022-12-30 MED ORDER — LORAZEPAM 1 MG PO TABS: 1.0000 mg | ORAL_TABLET | ORAL | Status: DC | PRN

## 2022-12-30 MED ORDER — HYDROMORPHONE HCL 1 MG/ML IJ SOLN
0.5000 mg | INTRAMUSCULAR | Status: DC | PRN
  Administered 2022-12-30: 0.5 mg via INTRAVENOUS
  Filled 2022-12-30: qty 0.5

## 2022-12-30 MED ORDER — DIPHENHYDRAMINE HCL 50 MG/ML IJ SOLN: 12.5000 mg | INTRAMUSCULAR | Status: DC | PRN

## 2022-12-30 MED ORDER — HALOPERIDOL 1 MG PO TABS: 2.0000 mg | ORAL_TABLET | Freq: Four times a day (QID) | ORAL | Status: DC | PRN

## 2022-12-30 MED ORDER — LORAZEPAM 2 MG/ML PO CONC: 1.0000 mg | ORAL | Status: DC | PRN

## 2022-12-30 MED ORDER — GLYCOPYRROLATE 0.2 MG/ML IJ SOLN: 0.2000 mg | INTRAMUSCULAR | Status: DC | PRN

## 2022-12-30 MED ORDER — MORPHINE SULFATE (PF) 2 MG/ML IV SOLN
2.0000 mg | INTRAVENOUS | Status: DC | PRN
  Administered 2022-12-30 – 2022-12-31 (×3): 2 mg via INTRAVENOUS
  Filled 2022-12-30 (×3): qty 1

## 2022-12-30 MED ORDER — BIOTENE DRY MOUTH MT LIQD
15.0000 mL | Freq: Two times a day (BID) | OROMUCOSAL | Status: DC
  Administered 2023-01-02 – 2023-01-03 (×2): 15 mL via TOPICAL

## 2022-12-30 MED ORDER — GLYCOPYRROLATE 1 MG PO TABS: 1.0000 mg | ORAL_TABLET | ORAL | Status: DC | PRN

## 2022-12-30 MED ORDER — ENSURE ENLIVE PO LIQD: 237.0000 mL | ORAL | Status: DC | PRN

## 2022-12-30 MED ORDER — HALOPERIDOL LACTATE 2 MG/ML PO CONC: 2.0000 mg | Freq: Four times a day (QID) | ORAL | Status: DC | PRN

## 2022-12-30 MED ORDER — GLYCOPYRROLATE 0.2 MG/ML IJ SOLN
0.2000 mg | INTRAMUSCULAR | Status: DC | PRN
  Administered 2023-01-03: 0.2 mg via INTRAVENOUS
  Filled 2022-12-30: qty 1

## 2022-12-30 NOTE — Evaluation (Signed)
Physical Therapy Evaluation Patient Details Name: Jordan [name] MRN: [id number] DOB: 1918/05/22 Today's Date: 12/30/2022  History of Present Illness  89 yo male presenting 4/15 with CP and fall.  PMHx: HTN CAD s/p stent TAVR AAA basal/ squamous cell carcinoma, dementia, pacemaker  Clinical Impression   Pt presents with generalized weakness, impaired cognition, poor to zero sitting balance, impaired activity tolerance. Pt to benefit from acute PT to address deficits. Pt requiring total +2 assist for bed mobility and dangling EOB, pt resistant to mobility although shouting out "help me, get me up". Pt will need 24/7 care at d/c, recommended skilled services with <3 hours therapy post-acutely unless family wants to take him home with constant care. PT to progress mobility as tolerated, and will continue to follow acutely.         Recommendations for follow up therapy are one component of a multi-disciplinary discharge planning process, led by the attending physician.  Recommendations may be updated based on patient status, additional functional criteria and insurance authorization.  Follow Up Recommendations       Assistance Recommended at Discharge Frequent or constant Supervision/Assistance  Patient can return home with the following  Two people to help with walking and/or transfers;Two people to help with bathing/dressing/bathroom;Assistance with cooking/housework;Assistance with feeding;Direct supervision/assist for medications management;Direct supervision/assist for financial management;Assist for transportation;Help with stairs or ramp for entrance    Equipment Recommendations None recommended by PT  Recommendations for Other Services       Functional Status Assessment Patient has had a recent decline in their functional status and/or demonstrates limited ability to make significant improvements in function in a reasonable and predictable amount of time     Precautions /  Restrictions Precautions Precautions: Fall Precaution Comments: L eye blindness (eye sealed shut with eye removed and scabbing over L eye) Restrictions Weight Bearing Restrictions: No      Mobility  Bed Mobility Overal bed mobility: Needs Assistance Bed Mobility: Supine to Sit, Sit to Supine, Rolling Rolling: Total assist, +2 for physical assistance, +2 for safety/equipment   Supine to sit: Total assist, +2 for physical assistance, +2 for safety/equipment Sit to supine: Total assist, +2 for physical assistance, +2 for safety/equipment   General bed mobility comments: yelling out to help him get up, once transfer was attempted he was yelling out "no, no, no." limited by confusion.    Transfers Overall transfer level: Needs assistance                 General transfer comment: nt    Ambulation/Gait                  Stairs            Wheelchair Mobility    Modified Rankin (Stroke Patients Only)       Balance Overall balance assessment: History of Falls, Needs assistance Sitting-balance support: Feet supported Sitting balance-Leahy Scale: Zero Sitting balance - Comments: heavy posterior bias requiring max support to maintain upright Postural control: Posterior lean     Standing balance comment: nt                             Pertinent Vitals/Pain Pain Assessment Pain Assessment: Faces Faces Pain Scale: Hurts even more Pain Location: chest Pain Descriptors / Indicators: Discomfort Pain Intervention(s): Monitored during session, Limited activity within patient's tolerance, Repositioned    Home Living Family/patient expects to be discharged to:: Private residence  Home Equipment: Agricultural consultant (2 wheels);Cane - single point Additional Comments: Pt presented from home - unable to report home set up    Prior Function Prior Level of Function : Needs assist;Patient poor historian/Family not available              Mobility Comments: Per chart, pt rather sedentary but mobilized some with SPC. per chart review, pt's son checks on him in the mornings ADLs Comments: unable to detail     Hand Dominance        Extremity/Trunk Assessment   Upper Extremity Assessment Upper Extremity Assessment: Defer to OT evaluation    Lower Extremity Assessment Lower Extremity Assessment: Generalized weakness;Difficult to assess due to impaired cognition    Cervical / Trunk Assessment Cervical / Trunk Assessment: Kyphotic  Communication   Communication: HOH  Cognition Arousal/Alertness: Awake/alert Behavior During Therapy: Anxious, Agitated, Restless Overall Cognitive Status: History of cognitive impairments - at baseline                                 General Comments: hx of dementia, no family present to determine baseline. Pt perseverating on "help me," and his chest pain throughout.        General Comments General comments (skin integrity, edema, etc.): wounds and lesions noted body-wide, most notably over L eye, on scalp, over L side of face    Exercises     Assessment/Plan    PT Assessment Patient needs continued PT services  PT Problem List Decreased strength;Decreased mobility;Decreased safety awareness;Decreased activity tolerance;Decreased balance;Decreased knowledge of use of DME;Pain;Cardiopulmonary status limiting activity;Decreased skin integrity;Decreased knowledge of precautions;Decreased cognition       PT Treatment Interventions DME instruction;Therapeutic activities;Gait training;Therapeutic exercise;Patient/family education;Balance training;Neuromuscular re-education;Functional mobility training    PT Goals (Current goals can be found in the Care Plan section)  Acute Rehab PT Goals PT Goal Formulation: With patient Time For Goal Achievement: 01/13/23 Potential to Achieve Goals: Fair    Frequency Min 2X/week     Co-evaluation PT/OT/SLP  Co-Evaluation/Treatment: Yes Reason for Co-Treatment: Complexity of the patient's impairments (multi-system involvement);Necessary to address cognition/behavior during functional activity;To address functional/ADL transfers PT goals addressed during session: Mobility/safety with mobility;Balance OT goals addressed during session: ADL's and self-care       AM-PAC PT "6 Clicks" Mobility  Outcome Measure Help needed turning from your back to your side while in a flat bed without using bedrails?: Total Help needed moving from lying on your back to sitting on the side of a flat bed without using bedrails?: Total Help needed moving to and from a bed to a chair (including a wheelchair)?: Total Help needed standing up from a chair using your arms (e.g., wheelchair or bedside chair)?: Total Help needed to walk in hospital room?: Total Help needed climbing 3-5 steps with a railing? : Total 6 Click Score: 6    End of Session   Activity Tolerance: Patient limited by fatigue;Other (comment) (limited by confusion) Patient left: in bed;with call bell/phone within reach;with bed alarm set Nurse Communication: Mobility status PT Visit Diagnosis: Other abnormalities of gait and mobility (R26.89)    Time: 5784-6962 PT Time Calculation (min) (ACUTE ONLY): 18 min   Charges:   PT Evaluation $PT Eval Low Complexity: 1 Low          Icie Kuznicki S, PT DPT Acute Rehabilitation Services Pager [phone]  Office [phone]   Emerald Gehres E Christain [place] 12/30/2022, 10:53 AM

## 2022-12-30 NOTE — Evaluation (Signed)
Occupational Therapy Evaluation Patient Details Name: Jordan [name] MRN: [id number] DOB: 11/20/1917 Today's Date: 12/30/2022   History of Present Illness 89 yo male presenting 4/15 with CP and fall.  PMHx: HTN CAD s/p stent TAVR AAA basal/ squamous cell carcinoma, dementia, pacemaker   Clinical Impression   Jordan [name] was evaluated s/p the above admission list. He presented from home and per chart mobilizes a limited amount with a SPC, otherwise spend a lot of time in a recliner at baseline. Upon evaluation he was significantly limited by confusion, weakness, pain, poor sitting balance and decreased activity tolerance. Overall he needs total A +2 for all aspects of his care and mobility at bed level. Pt will benefit from continued acute OT services and skilled inpatient follow up therapy, <3 hours/day - unless family wants pt to d/c home in which case pt will need maximal services, DME and 24/7 care.       Recommendations for follow up therapy are one component of a multi-disciplinary discharge planning process, led by the attending physician.  Recommendations may be updated based on patient status, additional functional criteria and insurance authorization.   Assistance Recommended at Discharge Frequent or constant Supervision/Assistance  Patient can return home with the following A lot of help with walking and/or transfers;Two people to help with walking and/or transfers;Two people to help with bathing/dressing/bathroom;A lot of help with bathing/dressing/bathroom;Assistance with cooking/housework;Assistance with feeding;Direct supervision/assist for medications management;Direct supervision/assist for financial management;Assist for transportation;Help with stairs or ramp for entrance    Functional Status Assessment  Patient has had a recent decline in their functional status and demonstrates the ability to make significant improvements in function in a reasonable and predictable amount of  time.  Equipment Recommendations  None recommended by OT       Precautions / Restrictions Precautions Precautions: Fall Restrictions Weight Bearing Restrictions: No      Mobility Bed Mobility Overal bed mobility: Needs Assistance Bed Mobility: Supine to Sit, Sit to Supine, Rolling Rolling: Total assist, +2 for physical assistance, +2 for safety/equipment   Supine to sit: Total assist, +2 for physical assistance, +2 for safety/equipment Sit to supine: Total assist, +2 for physical assistance, +2 for safety/equipment   General bed mobility comments: yelling out to help him get up, once transfer was attempted he was yelling out "no, no, no." limited by confusion.    Transfers Overall transfer level: Needs assistance                 General transfer comment: nt      Balance Overall balance assessment: History of Falls, Needs assistance Sitting-balance support: Feet supported Sitting balance-Leahy Scale: Zero                                     ADL either performed or assessed with clinical judgement   ADL Overall ADL's : Needs assistance/impaired   Eating/Feeding Details (indicate cue type and reason): hand over hand to safely drink water from cup with a straw                                   General ADL Comments: total A for all aspects of care at bed level due to impiared cognition     Vision Baseline Vision/History: 2 Legally blind Vision Assessment?: Vision impaired- to be further tested in functional context Additional  Comments: does not have L eye - tracking with R eye appropriately     Perception Perception Perception Tested?: No   Praxis Praxis Praxis tested?: Not tested    Pertinent Vitals/Pain Pain Assessment Pain Assessment: Faces Faces Pain Scale: Hurts even more Pain Location: chest Pain Descriptors / Indicators: Discomfort Pain Intervention(s): Limited activity within patient's tolerance, Monitored during  session     Hand Dominance     Extremity/Trunk Assessment Upper Extremity Assessment Upper Extremity Assessment: Generalized weakness;Difficult to assess due to impaired cognition   Lower Extremity Assessment Lower Extremity Assessment: Defer to PT evaluation   Cervical / Trunk Assessment Cervical / Trunk Assessment: Normal   Communication Communication Communication: HOH   Cognition Arousal/Alertness: Awake/alert Behavior During Therapy: Anxious, Agitated, Restless Overall Cognitive Status: History of cognitive impairments - at baseline                                 General Comments: hx of dementia, no family present to determine baseline. Pt perseverating on "help me," and his chest pain throughout.     General Comments  VSS, multiple areas of skin impairments. Pt complaining of CP throughout        Home Living Family/patient expects to be discharged to:: Private residence         Additional Comments: Pt presented from home - unable to report home set up      Prior Functioning/Environment Prior Level of Function : Needs assist;Patient poor historian/Family not available             Mobility Comments: Per chart, pt rather sedentary but mobilized some with [facility] ADLs Comments: unable to detail        OT Problem List: Decreased strength;Decreased activity tolerance;Decreased range of motion;Impaired balance (sitting and/or standing);Decreased cognition;Decreased safety awareness;Decreased knowledge of use of DME or AE;Decreased knowledge of precautions      OT Treatment/Interventions: Self-care/ADL training;Therapeutic exercise;DME and/or AE instruction;Therapeutic activities;Patient/family education;Balance training    OT Goals(Current goals can be found in the care plan section) Acute Rehab OT Goals Patient Stated Goal: "help me" OT Goal Formulation: With patient Time For Goal Achievement: 01/13/23 Potential to Achieve Goals: Good ADL  Goals Pt Will Perform Grooming: with mod assist;sitting Pt Will Perform Upper Body Dressing: with mod assist;sitting Pt Will Perform Lower Body Dressing: with max assist;sit to/from stand Pt Will Transfer to Toilet: with max assist;stand pivot transfer;ambulating Additional ADL Goal #1: Pt will complete bed mobility with mod A as a precursor to ADLs  OT Frequency: Min 2X/week    Co-evaluation PT/OT/SLP Co-Evaluation/Treatment: Yes Reason for Co-Treatment: Complexity of the patient's impairments (multi-system involvement);Necessary to address cognition/behavior during functional activity;To address functional/ADL transfers   OT goals addressed during session: ADL's and self-care      AM-PAC OT "6 Clicks" Daily Activity     Outcome Measure Help from another person eating meals?: Total Help from another person taking care of personal grooming?: Total Help from another person toileting, which includes using toliet, bedpan, or urinal?: Total Help from another person bathing (including washing, rinsing, drying)?: Total Help from another person to put on and taking off regular upper body clothing?: Total Help from another person to put on and taking off regular lower body clothing?: Total 6 Click Score: 6   End of Session Equipment Utilized During Treatment: Oxygen Nurse Communication: Mobility status  Activity Tolerance: Patient tolerated treatment well Patient left: in bed;with call bell/phone  within reach;with bed alarm set  OT Visit Diagnosis: Unsteadiness on feet (R26.81);Other abnormalities of gait and mobility (R26.89);Muscle weakness (generalized) (M62.81);History of falling (Z91.81);Pain                Time: 1610-9604 OT Time Calculation (min): 19 min Charges:  OT General Charges $OT Visit: 1 Visit OT Evaluation $OT Eval Moderate Complexity: 1 Mod  Derenda Mis, OTR/L Acute Rehabilitation Services Office [phone] Secure Chat Communication Preferred   Donia Pounds 12/30/2022, 10:44 AM

## 2022-12-30 NOTE — Hospital Course (Signed)
Jordan [name] is a 89 y.o. male with medical history significant of AAA, BPH, CAD s/p stent, pacemaker, HTN, hyperlipidemia L renal mass, and s/p TAVR presented to hospital with chest wall pain.  Patient lives alone and son checks on him periodically. He cannot see from his L eye (SCC) and does not hear well.  He is mostly nonambulatory mostly in a recliner.  Patient's son noted that he was on the floor with right shoulder wound.  He was incoherent and 911 was called in.  In the ED patient was noted to have leukocytosis.  Initial creatinine was elevated at 2.1.  CT scan of the chest showed lung cancer with 7 cm cavitary mass.  Patient does have history of left his course is cancer that he has not done anything upon.  History of aortic aneurysm 5.7cm (probably not candidate for intervention/repair given age and other issues).    Assessment and plan.  Fall at home Frail elderly nonambulatory with hearing and vision issues with fall this time.  Will need to continue goals of care discussion.  Patient's son stated that the patient cannot live with him.  Palliative care has been consulted.  PT OT evaluation.   Chest pain Mild troponin elevation with negative delta, likely related to demand ischemia.  Likely chest pain secondary to his fall but due to lung cancer as below.  Do not think patient is a candidate for any cardiac intervention.  Continue Plavix.  Patient only has pacemaker.   AKI on stage 3a CKD Received IV fluids.  Initial rating and at 2.1.  Has improved to 1.5 today.   Anemia Patient with worsening anemia.  No external bleed.  He is unlikely to be a candidate to evaluated for bleeding and visibly show even with transfusion there is risk of recurrence.  Will await palliative care input.  Hemoglobin today at 6.5 from 6.7 yesterday.  Type and screen has been done.  Lung cancer CT of the chest shows pituitary mass likely SCC or other primary lung cancer.  Not a candidate for treatment.   Palliative care hospice discussion likely be beneficial for him.  AAA Enlarging AAA.  Not a candidate for intervention.  Risk of rupture. Essential hypertension. -Continue amlodipine   HLD -Continue atorvastatin   BPH -Continue finasteride, silodosin   L eye blindness History of squamous cell cancer and no treatment.   DNR Admitting provider has spoken with the patient's son.

## 2022-12-30 NOTE — Progress Notes (Signed)
PROGRESS NOTE    Jordan [name]  ZOX:[id number] DOB: 1918/07/03 DOA: 12/29/2022 PCP: Garlan Fillers, MD    Brief Narrative:  Jordan [name] is a 89 y.o. male with medical history significant of AAA, BPH, CAD s/p stent, pacemaker, HTN, hyperlipidemia L renal mass, and s/p TAVR presented to hospital with chest wall pain.  Patient lives alone and son checks on him periodically. He cannot see from his L eye (SCC) and does not hear well.  He is mostly nonambulatory mostly in a recliner.  Patient's son noted that he was on the floor with right shoulder wound.  He was incoherent and 911 was called in.  In the ED patient was noted to have leukocytosis.  Initial creatinine was elevated at 2.1.  CT scan of the chest showed lung cancer with 7 cm cavitary mass.  Patient does have history of left his course is cancer that he has not done anything upon.  History of aortic aneurysm 5.7cm (probably not candidate for intervention/repair given age and other issues).    Assessment and plan.  Fall at home Frail elderly nonambulatory with hearing and vision issues with fall this time.  Will need to continue goals of care discussion.  Patient's son stated that the patient cannot live with him.  Palliative care has been consulted and plan is comfort care at this time.   Chest pain Mild troponin elevation with negative delta, likely related to demand ischemia.  Likely chest pain secondary to his fall but due to lung cancer as below.  Do not think patient is a candidate for any cardiac intervention.  Plan is comfort care at this time.  On Plavix.   AKI on stage 3a CKD Received IV fluids.  Initial rating and at 2.1.  Has improved to 1.5 today.   Anemia Patient with worsening anemia.  No external bleed.  He is unlikely to be a candidate to evaluated for bleeding and visibly show even with transfusion there is risk of recurrence.  Will await palliative care input.  Hemoglobin today at 6.5 from 6.7 yesterday.   Plan is for comfort care no transfusion.  Lung cancer CT of the chest shows pituitary mass likely SCC or other primary lung cancer.  Not a candidate for treatment.  Plan is comfort care at this time.  AAA Enlarging AAA.  Not a candidate for intervention.  Risk of rupture.  Essential hypertension. 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   DNR Admitting provider has spoken with the patient's son.  Goals of care.  Patient has been seen by palliative care and at this time plan is to proceed with comfort care.      DVT prophylaxis: None for comfort   Code Status:     Code Status: DNR  Disposition: Possible hospice Status is: Inpatient  Remains inpatient appropriate because: Initiation of comfort care, possible need for hospice   Family Communication: None at bedside  Consultants:  Palliative care  Procedures:  None  Antimicrobials:  None  Anti-infectives (From admission, onward)    None      Subjective: Today, patient was seen and examined at bedside.  Patient complains of pain and discomfort over the chest wall.  Mildly Communicative.  Appears to be in distress.  Elderly frail gentleman.  Objective: Vitals:   12/29/22 2035 12/29/22 2350 12/30/22 0358 12/30/22 0826  BP: 98/63 (!) 104/50 (!) 93/46  126/85  Pulse: 86 91 (!) 102 (!) 118  Resp: 16 16 16 16   Temp: 97.6 F (36.4 C) 98 F (36.7 C) 98.6 F (37 C) 97.7 F (36.5 C)  TempSrc: Oral Oral  Oral  SpO2: 100% 100% (!) 80% 95%  Weight:      Height:        Intake/Output Summary (Last 24 hours) at 12/30/2022 1317 Last data filed at 12/30/2022 1156 Gross per 24 hour  Intake --  Output 600 ml  Net -600 ml   Filed Weights   12/29/22 0157  Weight: 56.7 kg    Physical Examination: Body mass index is 22.86 kg/m.   General: Thinly built, elderly male, in mild distress, appears anxious hard of  hearing, HENT:   Left eye with mass Chest: .  Diminished breath sounds bilaterally. No crackles or wheezes.  CVS: S1 &S2 heard. No murmur.  Regular rate and rhythm. Abdomen: Soft, nontender, nondistended.  Bowel sounds are heard.   Extremities: No cyanosis, clubbing or edema.  Peripheral pulses are palpable. Psych: Alert, awake and Communicative, anxious CNS:  No cranial nerve deficits.  Moves all extremities. Skin: Warm and dry.  Left eye with mass.  Data Reviewed:   CBC: Recent Labs  Lab 12/29/22 0149 12/29/22 0154 12/30/22 0506 12/30/22 0641  WBC 17.1*  --  11.4*  --   NEUTROABS 15.8*  --   --   --   HGB 7.4* 8.5* 6.7* 6.5*  HCT 25.3* 25.0* 21.9* 22.3*  MCV 85.5  --  83.3  --   PLT 252  --  205  --     Basic Metabolic Panel: Recent Labs  Lab 12/29/22 0149 12/29/22 0154 12/30/22 0506  NA 138 138 137  K 4.2 4.2 4.2  CL 96* 98 103  CO2 27  --  26  GLUCOSE 134* 128* 88  BUN 40* 37* 33*  CREATININE 2.17* 2.10* 1.51*  CALCIUM 9.5  --  8.9    Liver Function Tests: Recent Labs  Lab 12/29/22 0149  AST 33  ALT 19  ALKPHOS 65  BILITOT 0.8  PROT 7.1  ALBUMIN 2.5*     Radiology Studies: CT CHEST ABDOMEN PELVIS WO CONTRAST  Result Date: 12/29/2022 CLINICAL DATA:  Trauma/fall EXAM: CT CHEST, ABDOMEN AND PELVIS WITHOUT CONTRAST TECHNIQUE: Multidetector CT imaging of the chest, abdomen and pelvis was performed following the standard protocol without IV contrast. RADIATION DOSE REDUCTION: This exam was performed according to the departmental dose-optimization program which includes automated exposure control, adjustment of the mA and/or kV according to patient size and/or use of iterative reconstruction technique. COMPARISON:  CT abdomen/pelvis dated 06/22/2021 FINDINGS: CT CHEST FINDINGS Cardiovascular: The heart is top-normal in size. No pericardial effusion. Status post TAVR. No evidence of thoracic aortic aneurysm. Atherosclerotic calcifications of the aortic arch.  Severe three-vessel coronary atherosclerosis. Right subclavian pacemaker. Mediastinum/Nodes: No evidence of anterior mediastinal hematoma. No suspicious mediastinal lymphadenopathy. Visualized thyroid is unremarkable. Lungs/Pleura: Bilobed cavitary mass in the left upper lobe, measuring 4.3 x 4.9 x 7.1 cm, highly suspicious for primary bronchogenic neoplasm, possibly squamous cell carcinoma. Mild dependent atelectasis in the posterior right upper lobe and bilateral lower lobes, right greater than left. No focal consolidation. No pleural effusion or pneumothorax. Musculoskeletal: Moderate compression fracture deformities of T6, T8, and T9, age indeterminate but likely chronic. Multiple old bilateral rib fracture deformities, without acute fracture. CT ABDOMEN PELVIS FINDINGS Hepatobiliary: Unenhanced liver is unremarkable. Layering small gallstones (series 2/image 46), without associated inflammatory  changes. No intrahepatic or extrahepatic duct dilatation. Pancreas: Within normal limits. Spleen: Within normal limits. Adrenals/Urinary Tract: Adrenal glands are within normal limits. Multiple bilateral renal cysts, measuring up to 5.2 cm in the left lower kidney (series 2/image 53), benign (Bosniak I). No follow-up is recommended. No hydronephrosis. Thick-walled, trabeculated bladder, suggesting chronic bladder outlet obstruction. Stomach/Bowel: Stomach is within normal limits. No evidence of bowel obstruction. Appendix is not discretely visualized. Mild left colonic diverticulosis, without evidence of diverticulitis. Vascular/Lymphatic: 5.7 cm infrarenal abdominal aortic aneurysm (series 2/image 65), previously 5.4 cm. Atherosclerotic calcifications of the abdominal aorta and branch vessels. No suspicious abdominopelvic lymphadenopathy. Reproductive: Prostatomegaly, suggesting BPH. Other: No abdominopelvic ascites. Musculoskeletal: Mild degenerative changes of the lumbar spine. Subacute/healing right inferior pubic  ramus fracture. IMPRESSION: No acute traumatic injury to the chest, abdomen, or pelvis. 7.1 cm cavitary left upper lobe mass, highly suspicious for primary bronchogenic neoplasm, possibly squamous cell carcinoma. 5.7 cm infrarenal abdominal aortic aneurysm, previously 5.4 cm. Consider vascular consultation as clinically warranted given the patient's age. Additional ancillary findings as above. Electronically Signed   By: Charline Bills M.D.   On: 12/29/2022 04:14   CT Head Wo Contrast  Result Date: 12/29/2022 CLINICAL DATA:  Fall. EXAM: CT HEAD WITHOUT CONTRAST CT CERVICAL SPINE WITHOUT CONTRAST TECHNIQUE: Multidetector CT imaging of the head and cervical spine was performed following the standard protocol without intravenous contrast. Multiplanar CT image reconstructions of the cervical spine were also generated. RADIATION DOSE REDUCTION: This exam was performed according to the departmental dose-optimization program which includes automated exposure control, adjustment of the mA and/or kV according to patient size and/or use of iterative reconstruction technique. COMPARISON:  None Available. FINDINGS: CT HEAD FINDINGS Brain: No evidence of acute infarction, hemorrhage, hydrocephalus, extra-axial collection or mass lesion/mass effect. Vascular: No hyperdense vessel or unexpected calcification. Skull: Normal. Negative for fracture or focal lesion. Sinuses/Orbits: Infiltrative soft tissue density mass around the left orbit, on reformats infiltrating into the soft tissues above the globe including the extraocular muscles. Other: Extensive streak artifact from positioning CT CERVICAL SPINE FINDINGS Alignment: Positional distortion.  No traumatic malalignment. Skull base and vertebrae: No acute fracture Soft tissues and spinal canal: No prevertebral fluid or swelling. No visible canal hematoma. Disc levels:  Limited degenerative changes for age. Upper chest: Reported separately IMPRESSION: 1. No evidence of acute  intracranial or cervical spine injury. 2. Infiltrative mass at the left orbit with deep extension above the globe. Electronically Signed   By: Tiburcio Pea M.D.   On: 12/29/2022 04:08   CT Cervical Spine Wo Contrast  Result Date: 12/29/2022 CLINICAL DATA:  Fall. EXAM: CT HEAD WITHOUT CONTRAST CT CERVICAL SPINE WITHOUT CONTRAST TECHNIQUE: Multidetector CT imaging of the head and cervical spine was performed following the standard protocol without intravenous contrast. Multiplanar CT image reconstructions of the cervical spine were also generated. RADIATION DOSE REDUCTION: This exam was performed according to the departmental dose-optimization program which includes automated exposure control, adjustment of the mA and/or kV according to patient size and/or use of iterative reconstruction technique. COMPARISON:  None Available. FINDINGS: CT HEAD FINDINGS Brain: No evidence of acute infarction, hemorrhage, hydrocephalus, extra-axial collection or mass lesion/mass effect. Vascular: No hyperdense vessel or unexpected calcification. Skull: Normal. Negative for fracture or focal lesion. Sinuses/Orbits: Infiltrative soft tissue density mass around the left orbit, on reformats infiltrating into the soft tissues above the globe including the extraocular muscles. Other: Extensive streak artifact from positioning CT CERVICAL SPINE FINDINGS Alignment: Positional distortion.  No traumatic  malalignment. Skull base and vertebrae: No acute fracture Soft tissues and spinal canal: No prevertebral fluid or swelling. No visible canal hematoma. Disc levels:  Limited degenerative changes for age. Upper chest: Reported separately IMPRESSION: 1. No evidence of acute intracranial or cervical spine injury. 2. Infiltrative mass at the left orbit with deep extension above the globe. Electronically Signed   By: Tiburcio Pea M.D.   On: 12/29/2022 04:08   DG Chest 2 View  Result Date: 12/29/2022 CLINICAL DATA:  Anterior chest wall  pain after fall yesterday. Low oxygen saturations. EXAM: CHEST - 2 VIEW COMPARISON:  Radiographs 10/25/2022. FINDINGS: The chin obscures the upper lungs. Stable enlargement of the cardiomediastinal silhouette. TAVR. Right chest wall pacemaker. Patchy opacity in the left mid lung is similar to 10/25/2022. Trace left pleural effusion. No definite pneumothorax. No definite displaced rib fractures. Similar wedging of a few midthoracic vertebral bodies. IMPRESSION: Patchy airspace opacity in the left mid lung is similar to 10/25/2022. There is some central lucency suspicious for cavitation. Chest CT is recommended for further evaluation. Electronically Signed   By: Minerva Fester M.D.   On: 12/29/2022 02:11      LOS: 1 day    Joycelyn Das, MD [facility] Available via Epic secure chat 7am-7pm After these hours, please refer to coverage provider listed on [email or web address] 12/30/2022, 1:17 PM

## 2022-12-30 NOTE — Progress Notes (Signed)
Daily Progress Note   Patient Name: Jordan [name]       Date: 12/30/2022 DOB: 1918/05/21  Age: 89 y.o. MRN#: [id number] Attending Physician: Joycelyn Das, MD Primary Care Physician: Garlan Fillers, MD Admit Date: 12/29/2022  Reason for Consultation/Follow-up: Establishing goals of care  Subjective: I have reviewed medical records including EPIC notes, MAR, and labs. Received report from primary RN - no acute concerns.   Went to visit patient at bedside - no family/visitors present. Patient was lying in bed awake, alert, oriented x4, and able to participate in simple conversation. He is HOH. No signs or non-verbal gestures of pain or discomfort noted. No respiratory distress, increased work of breathing, or secretions noted. Patient endorses chest pain.   Attempted to discuss and assess thoughts related to blood transfusion in context of his current medical situation - he does not seem to be able to make complex medical decisions.  10:47 AM - 11:40 AM Called patient's son/[name] - emotional support provided. He was able to receive updates from his wife/Fran yesterday. Harvie Heck understands patient's current acute medical situation. We discussed this in detail in context of patient's overall health and comorbities. We reviewed: patient's cancer history, new lung mass found (reviewed CXR from Feb and most recent from this admission), enlarging AAA, and worsening anemia.  Reviewed lab work.  Harvie Heck questions what "is the next step for lung mass?" He wonders if a biopsy is necessary. We discussed patient is likely not a good candidate for aggressive disease modifying treatments considering his advanced age and frailty; therefore, we would not recommend pursuing risky invasive procedures such as  biopsy because it would not change the treatment plan. Harvie Heck expressed understanding and does not feel aggressive work up is warranted. Harvie Heck tells me he has seen "failure to thrive indicators." He learned the term FTT as his mother/patient's wife was on her EOL journey - he is recognizing a lot of the same signs. Harvie Heck "knows the time is drawing near." Patient recently told Harvie Heck that he was "ready to see Mitchel Honour (his wife who has passed)." He tells me "this is the beginning of the end."  We discussed patient's anemia in detail. Harvie Heck understands that patient is not a candidate for invasive procedures to explore where bleeding is coming from; therefore, patient remains at high risk for ongoing  bleeding despite receiving a blood transfusion. We discussed how a blood transfusion would be aimed at prolonging patient's life, but would not change his overall poor prognosis. Prognosis with and without blood transfusion was reviewed. At first, Harvie Heck tells me "if patient's life can be prolonged with simple blood transfusion, do it;" however, after detailed discussion and considering patient holistically, Harvie Heck is clear he would not want to pursue blood transfusion. He wants me to "make sure the whole team knows not to do it."   We talked about transition to comfort measures in house and what that would entail inclusive of medications to control pain, dyspnea, agitation, nausea, and itching. We discussed stopping all unnecessary measures such as blood draws, needle sticks, oxygen, antibiotics, CBGs/insulin, cardiac monitoring, IVF, and frequent vital signs. Education provided that other non-pharmacological interventions would be utilized for holistic support and comfort such as spiritual support if requested, repositioning, music therapy, offering comfort feeds, and/or therapeutic listening. All care would focus on how the patient is looking and feeling. Harvie Heck opts for patient's transition to full comfort measures today.  He would like to keep any medications that may be helping with patient's chest pain. We discussed chest pain is possibly multifactorial: related to fall, lung mass, CAD/demand ischemia.  Provided education and counseling at length on the philosophy and benefits of hospice care. Discussed that it offers a holistic approach to care in the setting of end-stage illness, and is about supporting the patient where they are allowing nature to take it's course. Discussed the hospice team includes RNs, physicians, social workers, and chaplains. They can provide personal care, support for the family, and help keep patient out of the hospital as well as assist with DME needs for home hospice. Education provided on the difference between home vs residential hospice. Harvie Heck would like to discuss option of home vs residential hospice with his wife prior to making final decisions. He understands that with home hospice patient will need 24/7 supervision/assistance - we discussed to meet this need a combination of family, hospice staff, and possibly private duty caregivers.   All questions and concerns addressed. Encouraged to call with questions and/or concerns. PMT number previously provided.  3:30 PM PMT RN/Dawn notified me son called - she met him and his wife at bedside to answer questions regarding expectations at EOL. Family are hopeful to discuss disposition with patient; however, he has recently received dose of dilaudid for chest pain and too sleepy to discuss at this time. Adjusted dilaudid from 0.5mg  to 0.25mg  IV.  Length of Stay: 1  Current Medications: Scheduled Meds:   amLODipine  5 mg Oral Daily   atorvastatin  10 mg Oral QPM   clopidogrel  75 mg Oral Daily   docusate sodium  100 mg Oral BID   enoxaparin (LOVENOX) injection  30 mg Subcutaneous Q24H   finasteride  5 mg Oral Daily   pantoprazole  40 mg Oral Daily   tamsulosin  0.4 mg Oral Daily    Continuous Infusions:  lactated ringers 75 mL/hr  at 12/30/22 0033    PRN Meds: acetaminophen **OR** acetaminophen, albuterol, bisacodyl, hydrALAZINE, ondansetron **OR** ondansetron (ZOFRAN) IV, oxyCODONE, polyethylene glycol  Physical Exam Vitals and nursing note reviewed.  Constitutional:      General: He is not in acute distress.    Appearance: He is ill-appearing.  Pulmonary:     Effort: No respiratory distress.  Skin:    General: Skin is warm and dry.  Neurological:     Mental  Status: He is alert and oriented to person, place, and time.     Motor: Weakness present.  Psychiatric:        Attention and Perception: Attention normal.        Behavior: Behavior is cooperative.        Cognition and Memory: Memory normal. Cognition is impaired.             Vital Signs: BP 126/85 (BP Location: Left Arm)   Pulse (!) 118   Temp 97.7 F (36.5 C) (Oral)   Resp 16   Ht  (1.575 m)   Wt 56.7 kg   SpO2 95%   BMI 22.86 kg/m  SpO2: SpO2: 95 % O2 Device: O2 Device: Nasal Cannula O2 Flow Rate: O2 Flow Rate (L/min): 3 L/min  Intake/output summary:  Intake/Output Summary (Last 24 hours) at 12/30/2022 1014 Last data filed at 12/30/2022 0981 Gross per 24 hour  Intake --  Output 450 ml  Net -450 ml   LBM: Last BM Date : 12/28/22 Baseline Weight: Weight: 56.7 kg Most recent weight: Weight: 56.7 kg       Palliative Assessment/Data: PPS 20-30%      Patient Active Problem List   Diagnosis Date Noted   Fall at home, initial encounter 12/29/2022   AKI (acute kidney injury) 12/29/2022   DNR (do not resuscitate) 12/29/2022   Pressure injury of skin 06/28/2021   Gross hematuria 06/22/2021   CKD (chronic kidney disease), stage III 06/22/2021   Complete heart block 09/21/2020   HTN (hypertension) 09/21/2020   Squamous cell carcinoma of skin of scalp 05/09/2020   Squamous cell cancer of multiple sites of skin of upper arm, right 05/09/2020   Cystoid macular edema of right eye 02/20/2020   Early stage nonexudative age-related  macular degeneration of both eyes 02/20/2020   Macular pucker, right eye 02/20/2020   Syncope 09/24/2019   AAA (abdominal aortic aneurysm) without rupture 08/14/2015   Severe aortic valve stenosis 05/15/2015   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   Aortic stenosis, severe 05/03/2015   Chest wall pain 03/09/2015   Abdominal aneurysm without mention of rupture 02/24/2013   Cavitating mass in left lower lung lobe 02/10/2013   Mobitz type II atrioventricular block 03/09/2012   Effusion of olecranon bursa, left 01/28/2012   Aortic stenosis 05/06/2011   Pacemaker 10/29/2010   ABDOMINAL PAIN-LUQ 06/21/2008   PERSONAL HX COLONIC POLYPS 06/21/2008   Hyperlipemia, mixed 06/19/2008   Anemia 06/19/2008   Coronary atherosclerosis 06/19/2008    Palliative Care Assessment & Plan   Patient Profile: 89 y.o. male  with past medical history of AAA, BPH, CAD s/p stent, pacemaker, HTN, HLD, L renal mass, and s/p TAVR, BCC of skin not on treatment presented to ED on 12/29/22 from home with complaints of chest pain s/p unwitnessed fall vs syncopal event. Patient was admitted on 12/29/2022 with fall at home, chest pain, AKI on CKD 3a, anemia, possible lung cancer, AAA.   Assessment: Principal Problem:   Fall at home, initial encounter Active Problems:   Hyperlipemia, mixed   Anemia   Coronary atherosclerosis   Pacemaker   Cavitating mass in left lower lung lobe   Chest wall pain   AAA (abdominal aortic aneurysm) without rupture   HTN (hypertension)   CKD (chronic kidney disease), stage III   AKI (acute kidney injury)   DNR (do not resuscitate)   Recommendations/Plan: Initiated full comfort measures. No blood transfusions Continue DNR/DNI as previously documented Family  in discussions on patient's discharge home with hospice vs residential placement - they will notify PMT with any decisions today; otherwise, are open to follow up from PMT or TOC Added orders for EOL  symptom management and to reflect full comfort measures, as well as discontinued orders that were not focused on comfort Unrestricted visitation orders were placed per current [facility] EOL visitation policy  Nursing to provide frequent assessments and administer PRN medications as clinically necessary to ensure EOL comfort PMT will continue to follow and support holistically  Symptom Management Dilaudid PRN severe pain/dyspnea/increased work of breathing/RR>25 Oxycodone PRN moderate pain Tylenol PRN pain/fever Biotin twice daily Benadryl PRN itching Robinul PRN secretions Haldol PRN agitation/delirium Ativan PRN anxiety/seizure/sleep/distress Zofran PRN nausea/vomiting Liquifilm Tears PRN dry eye Albuterol PRN wheezing Continue oral medications while tolerating POs per family's request: amlodipine, plavix, finasteride, hydralazine, tamsulosin    Goals of Care and Additional Recommendations: Limitations on Scope of Treatment: Full Comfort Care  Code Status:    Code Status Orders  (From admission, onward)           Start     Ordered   12/29/22 1014  Do not attempt resuscitation (DNR)  Continuous       Question Answer Comment  If patient has no pulse and is not breathing Do Not Attempt Resuscitation   If patient has a pulse and/or is breathing: Medical Treatment Goals LIMITED ADDITIONAL INTERVENTIONS: Use medication/IV fluids and cardiac monitoring as indicated; Do not use intubation or mechanical ventilation (DNI), also provide comfort medications.  Transfer to Progressive/Stepdown as indicated, avoid Intensive Care.   Consent: Discussion documented in EHR or advanced directives reviewed      12/29/22 1014           Code Status History     Date Active Date Inactive Code Status Order ID Comments User Context   06/22/2021 2358 06/29/2021 1751 Full Code [id number]  Rometta Emery, MD Inpatient   09/24/2019 1819 09/25/2019 2242 Full Code [id number]  Bridget Hartshorn,  DO ED   05/15/2015 1429 05/19/2015 1712 Full Code [id number]  Kathleene Hazel, MD Inpatient   04/11/2015 1001 04/11/2015 1859 Full Code [id number]  Kathleene Hazel, MD Inpatient   03/09/2015 1921 03/12/2015 1722 Full Code [id number]  Penny Pia, MD Inpatient      Advance Directive Documentation    Flowsheet Row Most Recent Value  Type of Advance Directive Out of facility DNR (pink MOST or yellow form)  Pre-existing out of facility DNR order (yellow form or pink MOST form) --  "MOST" Form in Place? --       Prognosis:  Poor  Discharge Planning: To Be Determined: home with hospice vs residential hospice  Care plan was discussed with primary RN, patient's son, TOC, Dr. Tyson Babinski  Thank you for allowing the Palliative Medicine Team to assist in the care of this patient.   Total Time 70 minutes Prolonged Time Billed  yes       Greater than 50%  of this time was spent counseling and coordinating care related to the above assessment and plan.  Haskel Khan, NP  Please contact Palliative Medicine Team phone at [phone] for questions and concerns.   *Portions of this note are a verbal dictation therefore any spelling and/or grammatical errors are due to the "[employer]" system interpretation.

## 2022-12-30 NOTE — Progress Notes (Signed)
Met with family to answer additional questions regarding what to expect at end of life. Provided [facility] [employer] [facility] and [employer] [facility] booklets.   Katherin Ramey MS Ed.S, RN Palliative Medicaine Team

## 2022-12-30 NOTE — Progress Notes (Signed)
Initial Nutrition Assessment  DOCUMENTATION CODES:   Not applicable  INTERVENTION:  - Add Ensure Enlive po BID, each supplement provides 350 kcal and 20 grams of protein.  - Liberalize to Regular diet.   NUTRITION DIAGNOSIS:   Inadequate oral intake related to inability to eat, lethargy/confusion as evidenced by meal completion < 50%.  GOAL:   Patient will meet greater than or equal to 90% of their needs  MONITOR:   PO intake  REASON FOR ASSESSMENT:   Consult Assessment of nutrition requirement/status  ASSESSMENT:   89 y.o. male admits related to chest wall pain. PMH includes: AAA, BPH, CAD s/p stent, pacemaker, HTN, HLD, L renal mass. Pt is currently receiving medical management related to fall at home.  Meds reviewed: lipitor, colace. Labs reviewed: BUN/Creatinine elevated.   The pt was sleeping at time of assessment, with no family present. Pt is oriented x1 and likely unable to provide detailed nutrition hx. Pt is currently on a heart healthy diet, RD will liberalize to Regular diet. Will also add Ensure shakes. Will continue to monitor PO intakes and POC. Palliative care has been consulted.   NUTRITION - FOCUSED PHYSICAL EXAM:  Unable to assess, attempt at f/u.   Diet Order:   Diet Order             Diet Heart Room service appropriate? Yes; Fluid consistency: Thin  Diet effective now                   EDUCATION NEEDS:   Not appropriate for education at this time  Skin:  Skin Assessment: Reviewed RN Assessment  Last BM:  4/14  Height:   Ht Readings from Last 1 Encounters:  12/29/22  (1.575 m)    Weight:   Wt Readings from Last 1 Encounters:  12/29/22 56.7 kg    Ideal Body Weight:     BMI:  Body mass index is 22.86 kg/m.  Estimated Nutritional Needs:   Kcal:  1415-1700 kcals  Protein:  70-85 gm  Fluid:  >/= 1.4 L  Bethann Humble, RD, LDN, CNSC.

## 2022-12-30 NOTE — Plan of Care (Signed)
  Problem: Education: Goal: Knowledge of General Education information will improve Description: Including pain rating scale, medication(s)/side effects and non-pharmacologic comfort measures Outcome: Not Progressing   Problem: Health Behavior/Discharge Planning: Goal: Ability to manage health-related needs will improve Outcome: Not Progressing   Problem: Clinical Measurements: Goal: Ability to maintain clinical measurements within normal limits will improve Outcome: Not Progressing Goal: Will remain free from infection Outcome: Not Progressing Goal: Diagnostic test results will improve Outcome: Not Progressing Goal: Respiratory complications will improve Outcome: Not Progressing Goal: Cardiovascular complication will be avoided Outcome: Not Progressing   Problem: Activity: Goal: Risk for activity intolerance will decrease Outcome: Not Progressing   Problem: Nutrition: Goal: Adequate nutrition will be maintained Outcome: Not Progressing   Problem: Coping: Goal: Level of anxiety will decrease Outcome: Not Progressing   Problem: Elimination: Goal: Will not experience complications related to bowel motility Outcome: Not Progressing Goal: Will not experience complications related to urinary retention Outcome: Not Progressing   Problem: Pain Managment: Goal: General experience of comfort will improve Outcome: Not Progressing   Problem: Safety: Goal: Ability to remain free from injury will improve Outcome: Not Progressing   Problem: Skin Integrity: Goal: Risk for impaired skin integrity will decrease Outcome: Not Progressing   Problem: Education: Goal: Knowledge of the prescribed therapeutic regimen will improve Outcome: Not Progressing   Problem: Coping: Goal: Ability to identify and develop effective coping behavior will improve Outcome: Not Progressing   Problem: Clinical Measurements: Goal: Quality of life will improve Outcome: Not Progressing   Problem:  Respiratory: Goal: Verbalizations of increased ease of respirations will increase Outcome: Not Progressing   Problem: Role Relationship: Goal: Family's ability to cope with current situation will improve Outcome: Not Progressing Goal: Ability to verbalize concerns, feelings, and thoughts to partner or family member will improve Outcome: Not Progressing   Problem: Pain Management: Goal: Satisfaction with pain management regimen will improve Outcome: Not Progressing   

## 2022-12-30 NOTE — Progress Notes (Signed)
Patient yelled out stating that he needed some help.  I assess pt and he stated that he needs to have a bm. I placed patient on bed pan and upon doing so he tried twisting my wrist.  I told patient not to do that.  He said that if we didn't help him he was going to start throwing things in the floor. I tried multiple attempts to reorient patient that he was in the hospital and he had recently suffered a fall at home

## 2022-12-31 DIAGNOSIS — W19XXXA Unspecified fall, initial encounter: Secondary | ICD-10-CM | POA: Diagnosis not present

## 2022-12-31 DIAGNOSIS — R0789 Other chest pain: Secondary | ICD-10-CM | POA: Diagnosis not present

## 2022-12-31 DIAGNOSIS — Y92009 Unspecified place in unspecified non-institutional (private) residence as the place of occurrence of the external cause: Secondary | ICD-10-CM | POA: Diagnosis not present

## 2022-12-31 DIAGNOSIS — I714 Abdominal aortic aneurysm, without rupture, unspecified: Secondary | ICD-10-CM | POA: Diagnosis not present

## 2022-12-31 DIAGNOSIS — R627 Adult failure to thrive: Secondary | ICD-10-CM | POA: Diagnosis not present

## 2022-12-31 DIAGNOSIS — J984 Other disorders of lung: Secondary | ICD-10-CM | POA: Diagnosis not present

## 2022-12-31 DIAGNOSIS — N1831 Chronic kidney disease, stage 3a: Secondary | ICD-10-CM

## 2022-12-31 DIAGNOSIS — N179 Acute kidney failure, unspecified: Secondary | ICD-10-CM | POA: Diagnosis not present

## 2022-12-31 MED ORDER — HALOPERIDOL 1 MG PO TABS: 0.5000 mg | ORAL_TABLET | Freq: Four times a day (QID) | ORAL | Status: DC | PRN

## 2022-12-31 MED ORDER — HALOPERIDOL LACTATE 2 MG/ML PO CONC: 0.5000 mg | Freq: Four times a day (QID) | ORAL | Status: DC | PRN

## 2022-12-31 MED ORDER — HALOPERIDOL LACTATE 5 MG/ML IJ SOLN: 0.5000 mg | Freq: Four times a day (QID) | INTRAMUSCULAR | Status: DC | PRN

## 2022-12-31 MED ORDER — MORPHINE SULFATE (PF) 2 MG/ML IV SOLN
0.5000 mg | INTRAVENOUS | Status: DC | PRN
  Administered 2023-01-02 – 2023-01-03 (×2): 0.5 mg via INTRAVENOUS
  Filled 2022-12-31 (×2): qty 1

## 2022-12-31 NOTE — Progress Notes (Signed)
Daily Progress Note   Patient Name: Jordan [name]       Date: 12/31/2022 DOB: 1918-01-12  Age: 89 y.o. MRN#: [id number] Attending Physician: Joycelyn Das, MD Primary Care Physician: Garlan Fillers, MD Admit Date: 12/29/2022  Reason for Consultation/Follow-up: Establishing goals of care  Patient Profile/HPI:  89 y.o. male with past medical history of AAA, BPH, CAD s/p stent, pacemaker, HTN, HLD, L renal mass, and s/p TAVR, BCC of skin not on treatment presented to ED on 12/29/22 from home with complaints of chest pain s/p unwitnessed fall vs syncopal event. Patient was admitted on 12/29/2022 with fall at home, chest pain, AKI on CKD 3a, anemia, possible lung cancer, AAA.   Subjective: Chart reviewed including labs, progress notes, imaging from this and previous encounters.  Patient is sleeping, arouses but does not answer questions. No family at bedside.  Returned this afternoon and met with patient's son and daughter in law at the bedside.  Patient is not eating or drinking. They are concerned that patient is oversedated from medications. Extended discussion regarding dying process and symptom management.  They would like meds adjusted in hopes of patient waking up so they can have discussion with him.  Also discussed hospice options and services.   Review of Systems  Unable to perform ROS: Mental status change     Physical Exam Vitals and nursing note reviewed.  Constitutional:      Appearance: He is ill-appearing.  Eyes:     Comments: Left eye invasive lesion  Cardiovascular:     Rate and Rhythm: Normal rate.  Neurological:     Comments: Sleeping but arouseable             Vital Signs: BP (!) 95/48 (BP Location: Left Arm)   Pulse 72   Temp (!) 97.5 F (36.4 C)  (Oral)   Resp 16   Ht  (1.575 m)   Wt 56.7 kg   SpO2 (!) 84%   BMI 22.86 kg/m  SpO2: SpO2: (!) 84 % O2 Device: O2 Device: Nasal Cannula O2 Flow Rate: O2 Flow Rate (L/min): 3 L/min  Intake/output summary:  Intake/Output Summary (Last 24 hours) at 12/31/2022 1350 Last data filed at 12/30/2022 1604 Gross per 24 hour  Intake --  Output 150 ml  Net -150 ml   LBM:  Last BM Date : 12/28/22 Baseline Weight: Weight: 56.7 kg Most recent weight: Weight: 56.7 kg       Palliative Assessment/Data: PPS: 20%      Patient Active Problem List   Diagnosis Date Noted   Fall at home, initial encounter 12/29/2022   AKI (acute kidney injury) 12/29/2022   DNR (do not resuscitate) 12/29/2022   Pressure injury of skin 06/28/2021   Gross hematuria 06/22/2021   CKD (chronic kidney disease), stage III 06/22/2021   Complete heart block 09/21/2020   HTN (hypertension) 09/21/2020   Squamous cell carcinoma of skin of scalp 05/09/2020   Squamous cell cancer of multiple sites of skin of upper arm, right 05/09/2020   Cystoid macular edema of right eye 02/20/2020   Early stage nonexudative age-related macular degeneration of both eyes 02/20/2020   Macular pucker, right eye 02/20/2020   Syncope 09/24/2019   AAA (abdominal aortic aneurysm) without rupture 08/14/2015   Severe aortic valve stenosis 05/15/2015   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   Aortic stenosis, severe 05/03/2015   Chest wall pain 03/09/2015   Abdominal aneurysm without mention of rupture 02/24/2013   Cavitating mass in left lower lung lobe 02/10/2013   Mobitz type II atrioventricular block 03/09/2012   Effusion of olecranon bursa, left 01/28/2012   Aortic stenosis 05/06/2011   Pacemaker 10/29/2010   ABDOMINAL PAIN-LUQ 06/21/2008   PERSONAL HX COLONIC POLYPS 06/21/2008   Hyperlipemia, mixed 06/19/2008   Anemia 06/19/2008   Coronary atherosclerosis 06/19/2008    Palliative Care Assessment & Plan     Assessment/Recommendations/Plan  Decrease morphine to 0.5mg  q 3 hr as needed Decrease haldol to 0.5mg  q6 hr as needed D/C lorazepam D/C oxycodone TOC order for hospice choice    Code Status: DNR  Prognosis:  < 2 weeks  Discharge Planning: To Be Determined  Care plan was discussed with patient's son and daughter in law  Thank you for allowing the Palliative Medicine Team to assist in the care of this patient.   Greater than 50%  of this time was spent counseling and coordinating care related to the above assessment and plan.  Ocie Bob, AGNP-C Palliative Medicine   Please contact Palliative Medicine Team phone at [phone] for questions and concerns.

## 2022-12-31 NOTE — Progress Notes (Signed)
PROGRESS NOTE    BROLIN DAMBROSIA  ZOX:[id number] DOB: [birth year] DOA: 12/29/2022 PCP: Garlan Fillers, MD    Brief Narrative:  Jordan [name] is a 89 y.o. male with medical history significant of AAA, BPH, CAD s/p stent, pacemaker, HTN, hyperlipidemia L renal mass, and s/p TAVR presented to hospital with chest wall pain.  Patient lives alone and son checks on him periodically. He cannot see from his L eye (SCC) and does not hear well.  He is mostly nonambulatory mostly in a recliner.  Patient's son noted that he was on the floor with right shoulder wound.  He was incoherent and 911 was called in.  In the ED, patient was noted to have leukocytosis.  Initial creatinine was elevated at 2.1.  CT scan of the chest showed lung cancer with 7 cm cavitary mass.  Patient does have history of left eye cancer that he has not done anything upon.  History of aortic aneurysm 5.7cm (probably not candidate for intervention/repair given age and other issues).  At this time, patient has been seen by palliative care and is undergoing comfort care.  Assessment and plan.  Fall at home Frail elderly nonambulatory with hearing and vision issues with fall this time.   Palliative care on board and plan is comfort care at this time.   Chest pain Mild troponin elevation with negative delta, likely related to demand ischemia.  Likely chest pain secondary to his fall and due to lung cancer.    Plan is comfort care at this time.     AKI on stage 3a CKD Received IV fluids.  Initial creatinine at 2.1.  Comfort care at this time.   Anemia Patient with worsening anemia.  No external bleed.  Latest hemoglobin of 6.5.  No plans for further testing or transfusion.  On comfort care.  Lung cancer CT of the chest shows pituitary mass likely SCC or other primary lung cancer.  Not a candidate for treatment.  Continue comfort care at this time.  AAA Enlarging AAA.  Not a candidate for intervention.  Risk of  rupture.  Essential hypertension. -Discontinue amlodipine   Hyperlipidemia  BPH Discontinue Flomax.   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   DNR Admitting provider has spoken with the patient's son.    DVT prophylaxis: None for comfort   Code Status:     Code Status: DNR  Disposition: Currently undergoing comfort care.  Plan for hospice care and assess  Status is: Inpatient  Remains inpatient appropriate because: Initiation of comfort care, possible transition to hospice   Family Communication: None at bedside  Consultants:  Palliative care  Procedures:  None  Antimicrobials:  None  Anti-infectives (From admission, onward)    None      Subjective: Today, patient was seen and examined at bedside.  Elderly frail male undergoing comfort care.  Denies overt pain.    Objective: Vitals:   12/30/22 0358 12/30/22 0826 12/30/22 2000 12/31/22 0914  BP: (!) 93/46 126/85 (!) 84/48 (!) 95/48  Pulse: (!) 102 (!) 118 (!) 105 72  Resp: 16 16 (!) 24 16  Temp: 98.6 F (37 C) 97.7 F (36.5 C) 98.6 F (37 C) (!) 97.5 F (36.4 C)  TempSrc:  Oral Axillary Oral  SpO2: (!) 80% 95% 90% (!) 84%  Weight:      Height:        Intake/Output Summary (Last 24 hours) at 12/31/2022 1336 Last data  filed at 12/30/2022 1604 Gross per 24 hour  Intake --  Output 150 ml  Net -150 ml    Filed Weights   12/29/22 0157  Weight: 56.7 kg    Physical Examination: Body mass index is 22.86 kg/m.   General: Alert awake but thinly built, frail elderly, anxious, hard of hearing.   HENT:   Left eye with mass Chest: .  Diminished breath sounds bilaterally. No crackles or wheezes.  CVS: S1 &S2 heard. No murmur.  Regular rate and rhythm. Abdomen: Soft, nontender, nondistended.  Bowel sounds are heard.   Extremities: No cyanosis, clubbing or edema.  Peripheral pulses are palpable. Psych: Alert, awake and mildly Communicative, anxious, CNS:  No  cranial nerve deficits.  Moves all extremities. Skin: Warm and dry.  Left eye with mass.  Data Reviewed:   CBC: Recent Labs  Lab 12/29/22 0149 12/29/22 0154 12/30/22 0506 12/30/22 0641  WBC 17.1*  --  11.4*  --   NEUTROABS 15.8*  --   --   --   HGB 7.4* 8.5* 6.7* 6.5*  HCT 25.3* 25.0* 21.9* 22.3*  MCV 85.5  --  83.3  --   PLT 252  --  205  --      Basic Metabolic Panel: Recent Labs  Lab 12/29/22 0149 12/29/22 0154 12/30/22 0506  NA 138 138 137  K 4.2 4.2 4.2  CL 96* 98 103  CO2 27  --  26  GLUCOSE 134* 128* 88  BUN 40* 37* 33*  CREATININE 2.17* 2.10* 1.51*  CALCIUM 9.5  --  8.9     Liver Function Tests: Recent Labs  Lab 12/29/22 0149  AST 33  ALT 19  ALKPHOS 65  BILITOT 0.8  PROT 7.1  ALBUMIN 2.5*      Radiology Studies: No results found.    LOS: 2 days    Joycelyn Das, MD [facility] Available via Epic secure chat 7am-7pm After these hours, please refer to coverage provider listed on [email or web address] 12/31/2022, 1:36 PM

## 2023-01-01 DIAGNOSIS — Z515 Encounter for palliative care: Secondary | ICD-10-CM | POA: Diagnosis not present

## 2023-01-01 DIAGNOSIS — R0789 Other chest pain: Secondary | ICD-10-CM | POA: Diagnosis not present

## 2023-01-01 DIAGNOSIS — I714 Abdominal aortic aneurysm, without rupture, unspecified: Secondary | ICD-10-CM | POA: Diagnosis not present

## 2023-01-01 DIAGNOSIS — J984 Other disorders of lung: Secondary | ICD-10-CM | POA: Diagnosis not present

## 2023-01-01 DIAGNOSIS — W19XXXA Unspecified fall, initial encounter: Secondary | ICD-10-CM | POA: Diagnosis not present

## 2023-01-01 DIAGNOSIS — Y92009 Unspecified place in unspecified non-institutional (private) residence as the place of occurrence of the external cause: Secondary | ICD-10-CM | POA: Diagnosis not present

## 2023-01-01 DIAGNOSIS — N179 Acute kidney failure, unspecified: Secondary | ICD-10-CM | POA: Diagnosis not present

## 2023-01-01 DIAGNOSIS — J189 Pneumonia, unspecified organism: Secondary | ICD-10-CM

## 2023-01-01 NOTE — Progress Notes (Addendum)
PROGRESS NOTE    Jordan [name]  ZOX:[id number] DOB: [birth year] DOA: 12/29/2022 PCP: Garlan Fillers, MD    Brief Narrative:  Jordan [name] is a 89 y.o. male with medical history significant of AAA, BPH, CAD s/p stent, pacemaker, HTN, hyperlipidemia L renal mass, and s/p TAVR presented to hospital with chest wall pain.  Patient lives alone and son checks on him periodically. He cannot see from his L eye (SCC) and does not hear well.  He is mostly nonambulatory mostly in a recliner.  Patient's son noted that he was on the floor with right shoulder wound.  He was incoherent and 911 was called in.  In the ED, patient was noted to have leukocytosis.  Initial creatinine was elevated at 2.1.  CT scan of the chest showed lung cancer with 7 cm cavitary mass.  Patient does have history of left eye cancer that he has not done anything upon.  History of aortic aneurysm 5.7cm (probably not candidate for intervention/repair given age and other issues).  At this time, patient has been seen by palliative care and is undergoing comfort care.  Assessment and plan.  Fall at home Frail elderly nonambulatory with hearing and vision issues with fall. Palliative care on board and plan is comfort care at this time.   Chest pain Mild troponin elevation with negative delta, likely related to demand ischemia.  Likely chest pain secondary to his fall and due to lung cancer.    Plan is comfort care at this time.     AKI on stage 3a CKD Received IV fluids.  Initial creatinine at 2.1.  Continuing comfort care at this time.   Anemia Patient with worsening anemia.  No external bleed.  Latest hemoglobin of 6.5.  No plans for further testing or transfusion.  On comfort care.  Lung cancer CT of the chest shows pituitary mass likely SCC or other primary lung cancer.  Not a candidate for treatment.  Continue comfort care at this time.  AAA Enlarging AAA.  Not a candidate for intervention.  Risk of  rupture.  Essential hypertension. 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   DNR     DVT prophylaxis: None for comfort   Code Status:     Code Status: DNR  Disposition: Currently undergoing comfort care.  Plan for hospice care pending.  Status is: Inpatient  Remains inpatient appropriate because: Initiation of comfort care, awaiting hospice arrangement   Family Communication: Spoke with the patient's son on the phone and updated him about the clinical condition of the patient.  Consultants:  Palliative care  Procedures:  None  Antimicrobials:  None  Anti-infectives (From admission, onward)    None      Subjective:  Today, patient was seen and examined at bedside frail elderly male.  Patient was seen and examined at bedside.  Elderly frail male undergoing comfort care.  Denies overt pain.    Objective: Vitals:   12/30/22 2000 12/31/22 0914 12/31/22 2322 01/01/23 0810  BP: (!) 84/48 (!) 95/48 106/60 (!) 109/54  Pulse: (!) 105 72  (!) 106  Resp: (!) Temp: 98.6 F (37 C) (!) 97.5 F (36.4 C)    TempSrc: Axillary Oral    SpO2: 90% (!) 84% 93%   Weight:      Height:  Intake/Output Summary (Last 24 hours) at 01/01/2023 1328 Last data filed at 01/01/2023 1200 Gross per 24 hour  Intake 240 ml  Output --  Net 240 ml    Filed Weights   12/29/22 0157  Weight: 56.7 kg    Physical Examination: Body mass index is 22.86 kg/m.   General: Alert awake but thinly built, frail elderly, anxious, hard of hearing.  Mildly sleepy but arousable HENT:   Left eye with mass Chest: .  Diminished breath sounds bilaterally. No crackles or wheezes.  CVS: S1 &S2 heard. No murmur.  Regular rate and rhythm. Abdomen: Soft, nontender, nondistended.  Bowel sounds are heard.   Extremities: No cyanosis, clubbing or edema.  Peripheral pulses are  palpable. Psych: Alert, awake and mildly Communicative, anxious, CNS:  No cranial nerve deficits.  Moves all extremities. Skin: Warm and dry.  Left eye with mass.  Data Reviewed:   CBC: Recent Labs  Lab 12/29/22 0149 12/29/22 0154 12/30/22 0506 12/30/22 0641  WBC 17.1*  --  11.4*  --   NEUTROABS 15.8*  --   --   --   HGB 7.4* 8.5* 6.7* 6.5*  HCT 25.3* 25.0* 21.9* 22.3*  MCV 85.5  --  83.3  --   PLT 252  --  205  --      Basic Metabolic Panel: Recent Labs  Lab 12/29/22 0149 12/29/22 0154 12/30/22 0506  NA 138 138 137  K 4.2 4.2 4.2  CL 96* 98 103  CO2 27  --  26  GLUCOSE 134* 128* 88  BUN 40* 37* 33*  CREATININE 2.17* 2.10* 1.51*  CALCIUM 9.5  --  8.9     Liver Function Tests: Recent Labs  Lab 12/29/22 0149  AST 33  ALT 19  ALKPHOS 65  BILITOT 0.8  PROT 7.1  ALBUMIN 2.5*      Radiology Studies: No results found.    LOS: 3 days    Joycelyn Das, MD [facility] Available via Epic secure chat 7am-7pm After these hours, please refer to coverage provider listed on [email or web address] 01/01/2023, 1:28 PM

## 2023-01-01 NOTE — TOC Initial Note (Signed)
Transition of Care [facility]) - Initial/Assessment Note    Patient Details  Name: Jordan [name] MRN: [id number] Date of Birth: June 05, 1918  [facility]) CM/SW Contact:    Laksh Hinners A [place], Theresia Majors Phone Number: 01/01/2023, 4:21 PM  Clinical Narrative:                 CSW met with pt's family at beside to provide list of options regarding residential facilities. List provided also had home hospice agencies listed. Pt's family said they "are being proactive" but are "still deciding" pertaining to hospice at home versus residential hospice facility. They stated they may visit facilities to assist with decision. CSW will continue to consult with family, situation ongoing.   Expected Discharge Plan:  (Home hospice versus Residential Hospice) Barriers to Discharge: No Barriers Identified   Patient Goals and CMS Choice Patient states their goals for this hospitalization and ongoing recovery are:: Pt's family is deciding between home care hospice versus residential hospice          Expected Discharge Plan and Services                                              Prior Living Arrangements/Services                       Activities of Daily Living Home Assistive Devices/Equipment: Environmental consultant (specify type), Cane (specify quad or straight) ADL Screening (condition at time of admission) Patient's cognitive ability adequate to safely complete daily activities?: Yes Is the patient deaf or have difficulty hearing?: Yes Does the patient have difficulty seeing, even when wearing glasses/contacts?: Yes Does the patient have difficulty concentrating, remembering, or making decisions?: Yes Patient able to express need for assistance with ADLs?: Yes Does the patient have difficulty dressing or bathing?: Yes Independently performs ADLs?: No Communication: Independent Dressing (OT): Needs assistance Is this a change from baseline?: Change from baseline, expected to last  <3days Grooming: Needs assistance Is this a change from baseline?: Change from baseline, expected to last <3 days Feeding: Needs assistance Is this a change from baseline?: Change from baseline, expected to last <3 days Bathing: Needs assistance Is this a change from baseline?: Change from baseline, expected to last <3 days Toileting: Needs assistance Is this a change from baseline?: Change from baseline, expected to last <3 days In/Out Bed: Needs assistance Is this a change from baseline?: Change from baseline, expected to last <3 days Walks in Home: Independent with device (comment) Is this a change from baseline?: Change from baseline, expected to last <3 days Does the patient have difficulty walking or climbing stairs?: Yes Weakness of Legs: Both Weakness of Arms/Hands: Both  Permission Sought/Granted                  Emotional Assessment Appearance:: Appears stated age Attitude/Demeanor/Rapport: Unable to Assess Affect (typically observed): Unable to Assess Orientation: : Oriented to Self Alcohol / Substance Use: Not Applicable Psych Involvement: No (comment)  Admission diagnosis:  Chest wall pain [R07.89] Elevated troponin [R79.89] Left upper lobe pneumonia [J18.9] AKI (acute kidney injury) [N17.9] Cavitating mass in left upper lung lobe [J98.4] Patient Active Problem List   Diagnosis Date Noted   Fall at home, initial encounter 12/29/2022   AKI (acute kidney injury) 12/29/2022   DNR (do not resuscitate) 12/29/2022   Pressure injury of  skin 06/28/2021   Gross hematuria 06/22/2021   CKD (chronic kidney disease), stage III 06/22/2021   Complete heart block 09/21/2020   HTN (hypertension) 09/21/2020   Squamous cell carcinoma of skin of scalp 05/09/2020   Squamous cell cancer of multiple sites of skin of upper arm, right 05/09/2020   Cystoid macular edema of right eye 02/20/2020   Early stage nonexudative age-related macular degeneration of both eyes 02/20/2020    Macular pucker, right eye 02/20/2020   Syncope 09/24/2019   AAA (abdominal aortic aneurysm) without rupture 08/14/2015   Severe aortic valve stenosis 05/15/2015   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   Aortic stenosis, severe 05/03/2015   Chest wall pain 03/09/2015   Abdominal aneurysm without mention of rupture 02/24/2013   Cavitating mass in left lower lung lobe 02/10/2013   Mobitz type II atrioventricular block 03/09/2012   Effusion of olecranon bursa, left 01/28/2012   Aortic stenosis 05/06/2011   Pacemaker 10/29/2010   ABDOMINAL PAIN-LUQ 06/21/2008   PERSONAL HX COLONIC POLYPS 06/21/2008   Hyperlipemia, mixed 06/19/2008   Anemia 06/19/2008   Coronary atherosclerosis 06/19/2008   PCP:  Garlan Fillers, MD Pharmacy:   [facility] [phone] - [place], [place] - [address] [place] [address] Phone: [phone] Fax: [phone]     Social Determinants of Health (SDOH) Social History: SDOH Screenings   Tobacco Use: Medium Risk (12/29/2022)   SDOH Interventions:     Readmission Risk Interventions     No data to display

## 2023-01-01 NOTE — Care Management Important Message (Signed)
Important Message  Patient Details  Name: ARLEN DUPUIS MRN: [id number] Date of Birth: 1918-06-23   Medicare Important Message Given:  Yes     Dorena Bodo 01/01/2023, 2:25 PM

## 2023-01-01 NOTE — Progress Notes (Signed)
Daily Progress Note   Patient Name: Jordan [name]       Date: 01/01/2023 DOB: May 21, 1918  Age: 89 y.o. MRN#: [id number] Attending Physician: Joycelyn Das, MD Primary Care Physician: Garlan Fillers, MD Admit Date: 12/29/2022  Reason for Consultation/Follow-up: Establishing goals of care  Patient Profile/HPI:  89 y.o. male with past medical history of AAA, BPH, CAD s/p stent, pacemaker, HTN, HLD, L renal mass, and s/p TAVR, BCC of skin not on treatment presented to ED on 12/29/22 from home with complaints of chest pain s/p unwitnessed fall vs syncopal event. Patient was admitted on 12/29/2022 with fall at home, chest pain, AKI on CKD 3a, anemia, possible lung cancer, AAA.   Subjective: Chart reviewed including labs, progress notes, imaging from this and previous encounters.  Patient awake this morning. Non verbal. Fidgeting.  Spoke with patient's son and daughter in Social worker. Reviewed last 24 hour meds with them.  Answered questions about end of life, dying process.   Review of Systems  Unable to perform ROS: Mental status change     Physical Exam Vitals and nursing note reviewed.  Constitutional:      Appearance: He is ill-appearing.  Eyes:     Comments: Left eye invasive lesion  Cardiovascular:     Rate and Rhythm: Normal rate.  Neurological:     Comments: Sleeping but arouseable             Vital Signs: BP (!) 109/54   Pulse (!) 106   Temp (!) 97.5 F (36.4 C) (Oral)   Resp 17   Ht  (1.575 m)   Wt 56.7 kg   SpO2 93%   BMI 22.86 kg/m  SpO2: SpO2: 93 % O2 Device: O2 Device: Nasal Cannula O2 Flow Rate: O2 Flow Rate (L/min): 3 L/min  Intake/output summary: No intake or output data in the 24 hours ending 01/01/23 1002  LBM: Last BM Date : 12/28/22 Baseline  Weight: Weight: 56.7 kg Most recent weight: Weight: 56.7 kg       Palliative Assessment/Data: PPS: 20%      Patient Active Problem List   Diagnosis Date Noted   Fall at home, initial encounter 12/29/2022   AKI (acute kidney injury) 12/29/2022   DNR (do not resuscitate) 12/29/2022   Pressure injury of skin 06/28/2021  Gross hematuria 06/22/2021   CKD (chronic kidney disease), stage III 06/22/2021   Complete heart block 09/21/2020   HTN (hypertension) 09/21/2020   Squamous cell carcinoma of skin of scalp 05/09/2020   Squamous cell cancer of multiple sites of skin of upper arm, right 05/09/2020   Cystoid macular edema of right eye 02/20/2020   Early stage nonexudative age-related macular degeneration of both eyes 02/20/2020   Macular pucker, right eye 02/20/2020   Syncope 09/24/2019   AAA (abdominal aortic aneurysm) without rupture 08/14/2015   Severe aortic valve stenosis 05/15/2015   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   Aortic stenosis, severe 05/03/2015   Chest wall pain 03/09/2015   Abdominal aneurysm without mention of rupture 02/24/2013   Cavitating mass in left lower lung lobe 02/10/2013   Mobitz type II atrioventricular block 03/09/2012   Effusion of olecranon bursa, left 01/28/2012   Aortic stenosis 05/06/2011   Pacemaker 10/29/2010   ABDOMINAL PAIN-LUQ 06/21/2008   PERSONAL HX COLONIC POLYPS 06/21/2008   Hyperlipemia, mixed 06/19/2008   Anemia 06/19/2008   Coronary atherosclerosis 06/19/2008    Palliative Care Assessment & Plan    Assessment/Recommendations/Plan  Continue comfort care as ordered Appreciate TOC assistance with hospice referral and disposition  Code Status: DNR  Prognosis:  < 2 weeks  Discharge Planning: To Be Determined  Care plan was discussed with patient's son and daughter in law  Thank you for allowing the Palliative Medicine Team to assist in the care of this patient.   Greater than 50%  of this  time was spent counseling and coordinating care related to the above assessment and plan.  Ocie Bob, AGNP-C Palliative Medicine   Please contact Palliative Medicine Team phone at [phone] for questions and concerns.

## 2023-01-02 DIAGNOSIS — W19XXXA Unspecified fall, initial encounter: Secondary | ICD-10-CM | POA: Diagnosis not present

## 2023-01-02 DIAGNOSIS — N179 Acute kidney failure, unspecified: Secondary | ICD-10-CM | POA: Diagnosis not present

## 2023-01-02 DIAGNOSIS — R638 Other symptoms and signs concerning food and fluid intake: Secondary | ICD-10-CM

## 2023-01-02 DIAGNOSIS — J984 Other disorders of lung: Secondary | ICD-10-CM | POA: Diagnosis not present

## 2023-01-02 DIAGNOSIS — R0789 Other chest pain: Secondary | ICD-10-CM | POA: Diagnosis not present

## 2023-01-02 NOTE — Progress Notes (Addendum)
Brief Palliative Medicine Progress Note:  Received notification patient's family called PMT phone with decision they would like to initiate residential hospice referral to [facility]. TOC consult placed. Since it is after 5p, will follow up with [facility] and hospice liaison tomorrow 4/20 morning. Per family, after their tour this afternoon, they were informed by hospice staff there are no beds available today.  Jordan [name] M. Katrinka Blazing [facility] Palliative Medicine Team Team Phone: [phone] NO CHARGE

## 2023-01-02 NOTE — Progress Notes (Signed)
Daily Progress Note   Patient Name: Jordan [name]       Date: 01/02/2023 DOB: Oct 26, 1917  Age: 89 y.o. MRN#: [id number] Attending Physician: Joycelyn Das, MD Primary Care Physician: Garlan Fillers, MD Admit Date: 12/29/2022  Reason for Consultation/Follow-up: Non pain symptom management, Pain control, Psychosocial/spiritual support, and Terminal Care  Subjective: I have reviewed medical records including EPIC notes, MAR, and labs. Received report from primary RN - no acute concerns. RN reports patient is not eating/drinking.  Met patient's son/[name] and DIL/Fran in hallway - emotional support provided. Therapeutic listening provided as they reflect over the course of the last several days. They continue to express surprise over patient's quick decline. Family are no longer interested in home hospice option; they are considering residential facilities, primary choice is [facility]. They plan on touring [facility] facility this afternoon. Detailed discussion was had on symptom management at EOL in context of comfort medications and oxygen use. Family tell me patient has been restless, taking his clothes off, pulling off oxygen, "labored breathing" - saying "he looks rough today." Patient has been refusing care - pushing people away when care attempted. Family state he has "not been receptive to care today."  Went to visit patient at bedside. He is lying in bed asleep - adjusted his pillow and patient weakly lifts hand to push me away. He does not open his eyes. Signs of discomfort noted. No respiratory distress or secretions noted; mild increase work of breathing. RR 20. After discussion, Harvie Heck and Drenda Freeze are agreeable for patient to receive morphine for increased work of breathing and  discomfort. They understand he has not received any PRN medications in over 24 hours and his current state is due to anticipated EOL decline. Natural trajectory at EOL reviewed.   Prognosis reviewed.  All questions and concerns addressed. Encouraged to call with questions and/or concerns. PMT card previously provided.  Notified RN family are in agreement for patient to receive morphine for comfort.  Length of Stay: 4  Current Medications: Scheduled Meds:   antiseptic oral rinse  15 mL Topical BID   finasteride  5 mg Oral Daily    Continuous Infusions:   PRN Meds: albuterol, diphenhydrAMINE, feeding supplement, glycopyrrolate **OR** glycopyrrolate **OR** glycopyrrolate, haloperidol **OR** haloperidol **OR** haloperidol lactate, morphine injection, ondansetron **OR** ondansetron (ZOFRAN) IV, polyethylene  glycol, polyvinyl alcohol  Physical Exam Vitals and nursing note reviewed.  Constitutional:      General: He is not in acute distress.    Appearance: He is ill-appearing.  Pulmonary:     Effort: No respiratory distress.  Skin:    General: Skin is warm and dry.     Findings: Ecchymosis present.  Neurological:     Mental Status: He is unresponsive.     Motor: Weakness present.             Vital Signs: BP (!) 132/97 (BP Location: Left Arm)   Pulse 92   Temp 98.1 F (36.7 C) (Oral)   Resp 17   Ht 5\' 2"  (1.575 m)   Wt 56.7 kg   SpO2 97%   BMI 22.86 kg/m  SpO2: SpO2: 97 % O2 Device: O2 Device: Nasal Cannula O2 Flow Rate: O2 Flow Rate (L/min): 3 L/min  Intake/output summary:  Intake/Output Summary (Last 24 hours) at 01/02/2023 1232 Last data filed at 01/01/2023 1800 Gross per 24 hour  Intake 120 ml  Output --  Net 120 ml   LBM: Last BM Date : 01/01/23 Baseline Weight: Weight: 56.7 kg Most recent weight: Weight: 56.7 kg       Palliative Assessment/Data: PPS 10%      Patient Active Problem List   Diagnosis Date Noted   Fall at home, initial encounter  12/29/2022   AKI (acute kidney injury) 12/29/2022   DNR (do not resuscitate) 12/29/2022   Pressure injury of skin 06/28/2021   Gross hematuria 06/22/2021   CKD (chronic kidney disease), stage III 06/22/2021   Complete heart block 09/21/2020   HTN (hypertension) 09/21/2020   Squamous cell carcinoma of skin of scalp 05/09/2020   Squamous cell cancer of multiple sites of skin of upper arm, right 05/09/2020   Cystoid macular edema of right eye 02/20/2020   Early stage nonexudative age-related macular degeneration of both eyes 02/20/2020   Macular pucker, right eye 02/20/2020   Syncope 09/24/2019   AAA (abdominal aortic aneurysm) without rupture 08/14/2015   Severe aortic valve stenosis 05/15/2015   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   Aortic stenosis, severe 05/03/2015   Chest wall pain 03/09/2015   Abdominal aneurysm without mention of rupture 02/24/2013   Cavitating mass in left lower lung lobe 02/10/2013   Mobitz type II atrioventricular block 03/09/2012   Effusion of olecranon bursa, left 01/28/2012   Aortic stenosis 05/06/2011   Pacemaker 10/29/2010   ABDOMINAL PAIN-LUQ 06/21/2008   PERSONAL HX COLONIC POLYPS 06/21/2008   Hyperlipemia, mixed 06/19/2008   Anemia 06/19/2008   Coronary atherosclerosis 06/19/2008    Palliative Care Assessment & Plan   Patient Profile: 89 y.o. male with past medical history of AAA, BPH, CAD s/p stent, pacemaker, HTN, HLD, L renal mass, and s/p TAVR, BCC of skin not on treatment presented to ED on 12/29/22 from home with complaints of chest pain s/p unwitnessed fall vs syncopal event. Patient was admitted on 12/29/2022 with fall at home, chest pain, AKI on CKD 3a, anemia, possible lung cancer, AAA.   Assessment: Principal Problem:   Fall at home, initial encounter Active Problems:   Hyperlipemia, mixed   Anemia   Coronary atherosclerosis   Pacemaker   Cavitating mass in left lower lung lobe   Chest wall pain   AAA  (abdominal aortic aneurysm) without rupture   HTN (hypertension)   CKD (chronic kidney disease), stage III   AKI (acute kidney injury)  DNR (do not resuscitate)   Terminal care  Recommendations/Plan: Continue full comfort measures Continue DNR/DNI as previously documented Family plan to tour [facility] today  Continue current comfort focused medication regimen - no changes PMT will continue to follow and support holistically  Symptom Management Morphine PRN pain/dyspnea/increased work of breathing/RR>25 Tylenol PRN pain/fever Biotin twice daily Benadryl PRN itching Robinul PRN secretions Haldol PRN agitation/delirium Zofran PRN nausea/vomiting Liquifilm Tears PRN dry eye Discontinued finasteride - no longer tolerating PO medications   Goals of Care and Additional Recommendations: Limitations on Scope of Treatment: Full Comfort Care  Code Status:    Code Status Orders  (From admission, onward)           Start     Ordered   12/30/22 1149  Do not attempt resuscitation (DNR)  Continuous       Question Answer Comment  If patient has no pulse and is not breathing Do Not Attempt Resuscitation   If patient has a pulse and/or is breathing: Medical Treatment Goals COMFORT MEASURES: Keep clean/warm/dry, use medication by any route; positioning, wound care and other measures to relieve pain/suffering; use oxygen, suction/manual treatment of airway obstruction for comfort; do not transfer unless for comfort needs.   Consent: Discussion documented in EHR or advanced directives reviewed      12/30/22 1151           Code Status History     Date Active Date Inactive Code Status Order ID Comments User Context   12/29/2022 1014 12/30/2022 1151 DNR [id number]  Jonah Blue, MD ED   06/22/2021 2358 06/29/2021 1751 Full Code [id number]  Rometta Emery, MD Inpatient   09/24/2019 1819 09/25/2019 2242 Full Code [id number]  Bridget Hartshorn, DO ED   05/15/2015 1429 05/19/2015 1712  Full Code [id number]  Kathleene Hazel, MD Inpatient   04/11/2015 1001 04/11/2015 1859 Full Code [id number]  Kathleene Hazel, MD Inpatient   03/09/2015 1921 03/12/2015 1722 Full Code [id number]  Penny Pia, MD Inpatient      Advance Directive Documentation    Flowsheet Row Most Recent Value  Type of Advance Directive Out of facility DNR (pink MOST or yellow form)  Pre-existing out of facility DNR order (yellow form or pink MOST form) --  "MOST" Form in Place? --       Prognosis:  Days - <2 weeks  Discharge Planning: Residential hospice facility vs hospital death  Care plan was discussed with primary RN, patient's family, TOC, Dr. Tyson Babinski  Thank you for allowing the Palliative Medicine Team to assist in the care of this patient.  Haskel Khan, NP  Please contact Palliative Medicine Team phone at [phone] for questions and concerns.   *Portions of this note are a verbal dictation therefore any spelling and/or grammatical errors are due to the "[employer]" system interpretation.

## 2023-01-02 NOTE — Progress Notes (Addendum)
PROGRESS NOTE    Jordan [name]  ZOX:[id number] DOB: [birth year] DOA: 12/29/2022 PCP: Garlan Fillers, MD    Brief Narrative:  Jordan [name] is a 89 y.o. male with medical history significant of AAA, BPH, CAD s/p stent, pacemaker, HTN, hyperlipidemia L renal mass, and s/p TAVR presented to hospital with chest wall pain.  Patient lives alone and son checks on him periodically. He cannot see from his L eye (SCC) and does not hear well.  He is mostly nonambulatory mostly in a recliner.  Patient's son noted that he was on the floor with right shoulder wound.  He was incoherent and 911 was called in.  In the ED, patient was noted to have leukocytosis.  Initial creatinine was elevated at 2.1.  CT scan of the chest showed lung cancer with 7 cm cavitary mass.  Patient does have history of left eye cancer that he has not done anything upon.  History of aortic aneurysm 5.7cm (probably not candidate for intervention/repair given age and other issues).  At this time, patient has been seen by palliative care and is undergoing comfort care.  Assessment and plan.  Fall at home Frail elderly nonambulatory with hearing and vision issues with fall. Palliative care on board and patient is undergoing comfort care at this time.  Awaiting for hospice placement.  Chest pain Mild troponin elevation with negative delta, likely related to demand ischemia.  Likely chest pain secondary to his fall and due to lung cancer.    Continue comfort care at this time.     AKI on stage 3a CKD Received IV fluids.  Baseline creatinine around 1.2.  Initial creatinine at 2.1.  Continuing comfort care at this time.  No plans for further blood work.   Anemia Patient with worsening anemia.  No external bleed.  Latest hemoglobin of 6.5.  No plans for further testing or transfusion.  On comfort care.  Lung cancer CT of the chest shows pituitary mass likely SCC or other primary lung cancer.  Not a candidate for treatment.   Continue comfort care at this time.  AAA Enlarging AAA.  Not a candidate for intervention.  Risk of rupture.  Essential hypertension. On comfort care   Hyperlipidemia On comfort care  BPH   L eye blindness History of squamous cell cancer and not received any treatment.   DNR     DVT prophylaxis: None for comfort   Code Status:     Code Status: DNR  Disposition: Currently undergoing comfort care.  Hospice when arranged, TOC on board.  Status is: Inpatient  Remains inpatient appropriate because on comfort care, awaiting hospice arrangement   Family Communication:  Spoke with the patient's son on the phone on 01/01/2023  Consultants:  Palliative care  Procedures:  None  Antimicrobials:  None  Anti-infectives (From admission, onward)    None      Subjective:  Today, patient was seen and examined at bedside.  Elderly frail gentleman.  Trying to get up from the bed.  Poor historian.   Objective: Vitals:   12/31/22 2322 01/01/23 0810 01/01/23 1954 01/02/23 0500  BP: 106/60 (!) 109/54 (!) 132/97   Pulse:  (!) 106 92   Resp: 17     Temp:   98.1 F (36.7 C)   TempSrc:   Oral   SpO2: 93%  (!) 73% 97%  Weight:      Height:        Intake/Output Summary (Last 24 hours) at 01/02/2023  1122 Last data filed at 01/01/2023 1800 Gross per 24 hour  Intake 360 ml  Output --  Net 360 ml    Filed Weights   12/29/22 0157  Weight: 56.7 kg    Physical Examination: Body mass index is 22.86 kg/m.   General: Alert awake frail elderly gentleman mildly anxious hard of hearing.   HENT:   Left eye with mass Chest: .  Diminished breath sounds bilaterally.  CVS: S1 &S2 heard. No murmur.  Regular rate and rhythm. Abdomen: Soft, nontender. Extremities: No edema. Psych: Alert, awake, anxious, CNS:  No cranial nerve deficits.  Moves all extremities. Skin: Warm and dry.  Left eye with mass.  Data Reviewed:   CBC: Recent Labs  Lab 12/29/22 0149 12/29/22 0154  12/30/22 0506 12/30/22 0641  WBC 17.1*  --  11.4*  --   NEUTROABS 15.8*  --   --   --   HGB 7.4* 8.5* 6.7* 6.5*  HCT 25.3* 25.0* 21.9* 22.3*  MCV 85.5  --  83.3  --   PLT 252  --  205  --      Basic Metabolic Panel: Recent Labs  Lab 12/29/22 0149 12/29/22 0154 12/30/22 0506  NA 138 138 137  K 4.2 4.2 4.2  CL 96* 98 103  CO2 27  --  26  GLUCOSE 134* 128* 88  BUN 40* 37* 33*  CREATININE 2.17* 2.10* 1.51*  CALCIUM 9.5  --  8.9     Liver Function Tests: Recent Labs  Lab 12/29/22 0149  AST 33  ALT 19  ALKPHOS 65  BILITOT 0.8  PROT 7.1  ALBUMIN 2.5*      Radiology Studies: No results found.    LOS: 4 days    Joycelyn Das, MD [facility] Available via Epic secure chat 7am-7pm After these hours, please refer to coverage provider listed on [email or web address] 01/02/2023, 11:22 AM

## 2023-01-03 DIAGNOSIS — J984 Other disorders of lung: Secondary | ICD-10-CM | POA: Diagnosis not present

## 2023-01-03 DIAGNOSIS — N179 Acute kidney failure, unspecified: Secondary | ICD-10-CM | POA: Diagnosis not present

## 2023-01-03 DIAGNOSIS — R0789 Other chest pain: Secondary | ICD-10-CM | POA: Diagnosis not present

## 2023-01-03 DIAGNOSIS — W19XXXA Unspecified fall, initial encounter: Secondary | ICD-10-CM | POA: Diagnosis not present

## 2023-01-03 MED ORDER — GLYCOPYRROLATE 1 MG PO TABS: 1.0000 mg | ORAL_TABLET | ORAL | Status: DC | PRN

## 2023-01-03 MED ORDER — POLYVINYL ALCOHOL 1.4 % OP SOLN: 1.0000 [drp] | Freq: Four times a day (QID) | OPHTHALMIC | Status: DC | PRN

## 2023-01-03 MED ORDER — ALBUTEROL SULFATE (2.5 MG/3ML) 0.083% IN NEBU: 2.5000 mg | INHALATION_SOLUTION | RESPIRATORY_TRACT | Status: DC | PRN

## 2023-01-03 MED ORDER — ENSURE ENLIVE PO LIQD: 237.0000 mL | ORAL | Status: DC | PRN

## 2023-01-03 MED ORDER — ONDANSETRON HCL 4 MG PO TABS: 4.0000 mg | ORAL_TABLET | Freq: Four times a day (QID) | ORAL | Status: DC | PRN

## 2023-01-03 MED ORDER — POLYETHYLENE GLYCOL 3350 17 G PO PACK: 17.0000 g | PACK | Freq: Every day | ORAL | 0 refills | Status: DC | PRN

## 2023-01-03 MED ORDER — MORPHINE SULFATE (PF) 2 MG/ML IV SOLN: 0.5000 mg | INTRAVENOUS | Status: DC | PRN

## 2023-01-03 MED ORDER — HALOPERIDOL 0.5 MG PO TABS: 0.5000 mg | ORAL_TABLET | Freq: Four times a day (QID) | ORAL | Status: DC | PRN

## 2023-01-03 NOTE — Discharge Summary (Signed)
Physician Discharge Summary  Jordan [name] ZOX:[id number] DOB: 1918/09/03 DOA: 12/29/2022  PCP: Garlan Fillers, MD  Admit date: 12/29/2022 Discharge date: 01/03/2023  Admitted From: Home  Discharge disposition: Residential hospice  Recommendations for Outpatient Follow-Up:   Follow up with hospice care provider.  Discharge Diagnosis:   Principal Problem:   Fall at home, initial encounter Active Problems:   Hyperlipemia, mixed   Anemia   Coronary atherosclerosis   Pacemaker   Cavitating mass in left lower lung lobe   Chest wall pain   AAA (abdominal aortic aneurysm) without rupture   HTN (hypertension)   CKD (chronic kidney disease), stage III   AKI (acute kidney injury)   DNR (do not resuscitate)   Discharge Condition: Improved.  Diet recommendation: .  Regular.  Wound care: None.  Code status: DNR   History of Present Illness:   Jordan [name] is a 89 y.o. male with medical history significant of AAA, BPH, CAD s/p stent, pacemaker, HTN, hyperlipidemia L renal mass, and s/p TAVR presented to hospital with chest wall pain.  Patient lives alone and son checks on him periodically. He cannot see from his L eye (SCC) and does not hear well.  He is mostly nonambulatory mostly in a recliner.  Patient's son noted that he was on the floor with right shoulder wound.  He was incoherent and 911 was called in.  In the ED, patient was noted to have leukocytosis.  Initial creatinine was elevated at 2.1.  CT scan of the chest showed lung cancer with 7 cm cavitary mass.  Patient does have history of left eye cancer that he has not done anything upon.  History of aortic aneurysm 5.7cm (probably not candidate for intervention/repair given age and other issues).  Patient was then admitted hospital for further evaluation.  Hospital Course:   Following conditions were addressed during hospitalization as listed below,  Fall at home Frail elderly nonambulatory with hearing and  vision issues with fall. Palliative care on board and patient is undergoing comfort care at this time.  Awaiting for hospice placement.   Chest pain Mild troponin elevation with negative delta, likely related to demand ischemia.  Likely chest pain secondary to his fall and due to lung cancer.    Continue comfort care    AKI on stage 3a CKD Received IV fluids.  Baseline creatinine around 1.2.  Initial creatinine at 2.1.  Continue comfort care.  Anemia Patient with worsening anemia.  No external bleed.  Latest hemoglobin of 6.5.  Continue comfort care.   Lung cancer CT of the chest shows pituitary mass likely SCC or other primary lung cancer.  Not a candidate for treatment.  Continue comfort care    AAA Enlarging AAA.  Not a candidate for intervention.  Risk of rupture.   Essential hypertension. 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   Disposition.  At this time, patient is stable for disposition to residential hospice.  Medical Consultants:   Palliative care   Procedures:    None Subjective:   Today, patient was seen and examined at bedside.  On comfort care.  Discharge Exam:   Vitals:   01/02/23 0500 01/02/23 2014  BP:  125/79  Pulse:  90  Resp:  17  Temp:  (!) 97.5 F (36.4 C)  SpO2: 97% 100%   Vitals:   01/01/23 0810 01/01/23  1954 01/02/23 0500 01/02/23 2014  BP: (!) 109/54 (!) 132/97  125/79  Pulse: (!) 106 92  90  Resp:    17  Temp:  98.1 F (36.7 C)  (!) 97.5 F (36.4 C)  TempSrc:  Oral  Oral  SpO2:  (!) 73% 97% 100%  Weight:      Height:        General: Alert awake, not in obvious distress elderly frail gentleman, mildly Communicative, weak and debilitated HENT: Left eye with fungating mass. Chest:   Diminished breath sounds bilaterally.  CVS: S1 &S2 heard. No murmur.  Regular rate and rhythm. Abdomen: Soft, nontender, nondistended.  Bowel  sounds are heard.   Extremities: No cyanosis, clubbing or edema.  Peripheral pulses are palpable. Psych: Alert awake mildly Communicative CNS: Moves extremities Skin: Warm and dry.  Left thyroid mass  The results of significant diagnostics from this hospitalization (including imaging, microbiology, ancillary and laboratory) are listed below for reference.     Diagnostic Studies:   CT CHEST ABDOMEN PELVIS WO CONTRAST  Result Date: 12/29/2022 CLINICAL DATA:  Trauma/fall EXAM: CT CHEST, ABDOMEN AND PELVIS WITHOUT CONTRAST TECHNIQUE: Multidetector CT imaging of the chest, abdomen and pelvis was performed following the standard protocol without IV contrast. RADIATION DOSE REDUCTION: This exam was performed according to the departmental dose-optimization program which includes automated exposure control, adjustment of the mA and/or kV according to patient size and/or use of iterative reconstruction technique. COMPARISON:  CT abdomen/pelvis dated 06/22/2021 FINDINGS: CT CHEST FINDINGS Cardiovascular: The heart is top-normal in size. No pericardial effusion. Status post TAVR. No evidence of thoracic aortic aneurysm. Atherosclerotic calcifications of the aortic arch. Severe three-vessel coronary atherosclerosis. Right subclavian pacemaker. Mediastinum/Nodes: No evidence of anterior mediastinal hematoma. No suspicious mediastinal lymphadenopathy. Visualized thyroid is unremarkable. Lungs/Pleura: Bilobed cavitary mass in the left upper lobe, measuring 4.3 x 4.9 x 7.1 cm, highly suspicious for primary bronchogenic neoplasm, possibly squamous cell carcinoma. Mild dependent atelectasis in the posterior right upper lobe and bilateral lower lobes, right greater than left. No focal consolidation. No pleural effusion or pneumothorax. Musculoskeletal: Moderate compression fracture deformities of T6, T8, and T9, age indeterminate but likely chronic. Multiple old bilateral rib fracture deformities, without acute fracture.  CT ABDOMEN PELVIS FINDINGS Hepatobiliary: Unenhanced liver is unremarkable. Layering small gallstones (series 2/image 46), without associated inflammatory changes. No intrahepatic or extrahepatic duct dilatation. Pancreas: Within normal limits. Spleen: Within normal limits. Adrenals/Urinary Tract: Adrenal glands are within normal limits. Multiple bilateral renal cysts, measuring up to 5.2 cm in the left lower kidney (series 2/image 53), benign (Bosniak I). No follow-up is recommended. No hydronephrosis. Thick-walled, trabeculated bladder, suggesting chronic bladder outlet obstruction. Stomach/Bowel: Stomach is within normal limits. No evidence of bowel obstruction. Appendix is not discretely visualized. Mild left colonic diverticulosis, without evidence of diverticulitis. Vascular/Lymphatic: 5.7 cm infrarenal abdominal aortic aneurysm (series 2/image 65), previously 5.4 cm. Atherosclerotic calcifications of the abdominal aorta and branch vessels. No suspicious abdominopelvic lymphadenopathy. Reproductive: Prostatomegaly, suggesting BPH. Other: No abdominopelvic ascites. Musculoskeletal: Mild degenerative changes of the lumbar spine. Subacute/healing right inferior pubic ramus fracture. IMPRESSION: No acute traumatic injury to the chest, abdomen, or pelvis. 7.1 cm cavitary left upper lobe mass, highly suspicious for primary bronchogenic neoplasm, possibly squamous cell carcinoma. 5.7 cm infrarenal abdominal aortic aneurysm, previously 5.4 cm. Consider vascular consultation as clinically warranted given the patient's age. Additional ancillary findings as above. Electronically Signed   By: Charline Bills M.D.   On: 12/29/2022 04:14   CT Head Wo  Contrast  Result Date: 12/29/2022 CLINICAL DATA:  Fall. EXAM: CT HEAD WITHOUT CONTRAST CT CERVICAL SPINE WITHOUT CONTRAST TECHNIQUE: Multidetector CT imaging of the head and cervical spine was performed following the standard protocol without intravenous contrast.  Multiplanar CT image reconstructions of the cervical spine were also generated. RADIATION DOSE REDUCTION: This exam was performed according to the departmental dose-optimization program which includes automated exposure control, adjustment of the mA and/or kV according to patient size and/or use of iterative reconstruction technique. COMPARISON:  None Available. FINDINGS: CT HEAD FINDINGS Brain: No evidence of acute infarction, hemorrhage, hydrocephalus, extra-axial collection or mass lesion/mass effect. Vascular: No hyperdense vessel or unexpected calcification. Skull: Normal. Negative for fracture or focal lesion. Sinuses/Orbits: Infiltrative soft tissue density mass around the left orbit, on reformats infiltrating into the soft tissues above the globe including the extraocular muscles. Other: Extensive streak artifact from positioning CT CERVICAL SPINE FINDINGS Alignment: Positional distortion.  No traumatic malalignment. Skull base and vertebrae: No acute fracture Soft tissues and spinal canal: No prevertebral fluid or swelling. No visible canal hematoma. Disc levels:  Limited degenerative changes for age. Upper chest: Reported separately IMPRESSION: 1. No evidence of acute intracranial or cervical spine injury. 2. Infiltrative mass at the left orbit with deep extension above the globe. Electronically Signed   By: Tiburcio Pea M.D.   On: 12/29/2022 04:08   CT Cervical Spine Wo Contrast  Result Date: 12/29/2022 CLINICAL DATA:  Fall. EXAM: CT HEAD WITHOUT CONTRAST CT CERVICAL SPINE WITHOUT CONTRAST TECHNIQUE: Multidetector CT imaging of the head and cervical spine was performed following the standard protocol without intravenous contrast. Multiplanar CT image reconstructions of the cervical spine were also generated. RADIATION DOSE REDUCTION: This exam was performed according to the departmental dose-optimization program which includes automated exposure control, adjustment of the mA and/or kV according to  patient size and/or use of iterative reconstruction technique. COMPARISON:  None Available. FINDINGS: CT HEAD FINDINGS Brain: No evidence of acute infarction, hemorrhage, hydrocephalus, extra-axial collection or mass lesion/mass effect. Vascular: No hyperdense vessel or unexpected calcification. Skull: Normal. Negative for fracture or focal lesion. Sinuses/Orbits: Infiltrative soft tissue density mass around the left orbit, on reformats infiltrating into the soft tissues above the globe including the extraocular muscles. Other: Extensive streak artifact from positioning CT CERVICAL SPINE FINDINGS Alignment: Positional distortion.  No traumatic malalignment. Skull base and vertebrae: No acute fracture Soft tissues and spinal canal: No prevertebral fluid or swelling. No visible canal hematoma. Disc levels:  Limited degenerative changes for age. Upper chest: Reported separately IMPRESSION: 1. No evidence of acute intracranial or cervical spine injury. 2. Infiltrative mass at the left orbit with deep extension above the globe. Electronically Signed   By: Tiburcio Pea M.D.   On: 12/29/2022 04:08   DG Chest 2 View  Result Date: 12/29/2022 CLINICAL DATA:  Anterior chest wall pain after fall yesterday. Low oxygen saturations. EXAM: CHEST - 2 VIEW COMPARISON:  Radiographs 10/25/2022. FINDINGS: The chin obscures the upper lungs. Stable enlargement of the cardiomediastinal silhouette. TAVR. Right chest wall pacemaker. Patchy opacity in the left mid lung is similar to 10/25/2022. Trace left pleural effusion. No definite pneumothorax. No definite displaced rib fractures. Similar wedging of a few midthoracic vertebral bodies. IMPRESSION: Patchy airspace opacity in the left mid lung is similar to 10/25/2022. There is some central lucency suspicious for cavitation. Chest CT is recommended for further evaluation. Electronically Signed   By: Minerva Fester M.D.   On: 12/29/2022 02:11     Labs:  Basic Metabolic  Panel: Recent Labs  Lab 12/29/22 0149 12/29/22 0154 12/30/22 0506  NA 138 138 137  K 4.2 4.2 4.2  CL 96* 98 103  CO2 27  --  26  GLUCOSE 134* 128* 88  BUN 40* 37* 33*  CREATININE 2.17* 2.10* 1.51*  CALCIUM 9.5  --  8.9   GFR Estimated Creatinine Clearance: 19.1 mL/min (A) (by C-G formula based on SCr of 1.51 mg/dL (H)). Liver Function Tests: Recent Labs  Lab 12/29/22 0149  AST 33  ALT 19  ALKPHOS 65  BILITOT 0.8  PROT 7.1  ALBUMIN 2.5*   No results for input(s): "LIPASE", "AMYLASE" in the last 168 hours. No results for input(s): "AMMONIA" in the last 168 hours. Coagulation profile No results for input(s): "INR", "PROTIME" in the last 168 hours.  CBC: Recent Labs  Lab 12/29/22 0149 12/29/22 0154 12/30/22 0506 12/30/22 0641  WBC 17.1*  --  11.4*  --   NEUTROABS 15.8*  --   --   --   HGB 7.4* 8.5* 6.7* 6.5*  HCT 25.3* 25.0* 21.9* 22.3*  MCV 85.5  --  83.3  --   PLT 252  --  205  --    Cardiac Enzymes: No results for input(s): "CKTOTAL", "CKMB", "CKMBINDEX", "TROPONINI" in the last 168 hours. BNP: Invalid input(s): "POCBNP" CBG: Recent Labs  Lab 12/29/22 0154  GLUCAP 117*   D-Dimer No results for input(s): "DDIMER" in the last 72 hours. Hgb A1c No results for input(s): "HGBA1C" in the last 72 hours. Lipid Profile No results for input(s): "CHOL", "HDL", "LDLCALC", "TRIG", "CHOLHDL", "LDLDIRECT" in the last 72 hours. Thyroid function studies No results for input(s): "TSH", "T4TOTAL", "T3FREE", "THYROIDAB" in the last 72 hours.  Invalid input(s): "FREET3" Anemia work up No results for input(s): "VITAMINB12", "FOLATE", "FERRITIN", "TIBC", "IRON", "RETICCTPCT" in the last 72 hours. Microbiology No results found for this or any previous visit (from the past 240 hour(s)).   Discharge Instructions:   Discharge Instructions     Diet general   Complete by: As directed    Discharge instructions   Complete by: As directed    Further care as per hospice  care provider.   Increase activity slowly   Complete by: As directed       Allergies as of 01/03/2023       Reactions   Aspirin Other (See Comments)   Results in stomach bleeds   Tape Other (See Comments)   SKIN IS THIN AND WILL TEAR EASILY!!   Nitroglycerin Other (See Comments)   Makes the patient faint        Medication List     STOP taking these medications    acetaminophen 325 MG tablet Commonly known as: TYLENOL   amLODipine 5 MG tablet Commonly known as: NORVASC   atorvastatin 10 MG tablet Commonly known as: LIPITOR   clopidogrel 75 MG tablet Commonly known as: PLAVIX   cyanocobalamin 1000 MCG tablet Commonly known as: VITAMIN B12   finasteride 5 MG tablet Commonly known as: PROSCAR   pantoprazole 40 MG tablet Commonly known as: PROTONIX   silodosin 4 MG Caps capsule Commonly known as: RAPAFLO   torsemide 20 MG tablet Commonly known as: DEMADEX       TAKE these medications    albuterol (2.5 MG/3ML) 0.083% nebulizer solution Commonly known as: PROVENTIL Take 3 mLs (2.5 mg total) by nebulization every 2 (two) hours as needed for wheezing.   feeding supplement Liqd Take 237 mLs by  mouth as needed (hunger/thirst).   glycopyrrolate 1 MG tablet Commonly known as: ROBINUL Take 1 tablet (1 mg total) by mouth every 4 (four) hours as needed (excessive secretions).   haloperidol 0.5 MG tablet Commonly known as: HALDOL Take 1 tablet (0.5 mg total) by mouth every 6 (six) hours as needed for agitation (or delirium).   morphine (PF) 2 MG/ML injection Inject 0.25 mLs (0.5 mg total) into the vein every 3 (three) hours as needed (pain).   ondansetron 4 MG tablet Commonly known as: ZOFRAN Take 1 tablet (4 mg total) by mouth every 6 (six) hours as needed for nausea.   polyethylene glycol 17 g packet Commonly known as: MIRALAX / GLYCOLAX Take 17 g by mouth daily as needed for mild constipation or moderate constipation.   polyvinyl alcohol 1.4 %  ophthalmic solution Commonly known as: LIQUIFILM TEARS Place 1 drop into both eyes 4 (four) times daily as needed for dry eyes.         Time coordinating discharge: 39 minutes  Signed:  Vina Byrd  [facility] 01/03/2023, 11:01 AM

## 2023-01-03 NOTE — Progress Notes (Signed)
Spoke to pt's son and dtr who confirm agreeable to hospice home placement and are requesting [facility]. Referral made to [facility] with [facility] who will review and f/u with family. SW will provide updates as available.   Dellie Burns, MSW, LCSW [phone] (coverage)

## 2023-01-03 NOTE — Progress Notes (Signed)
Daily Progress Note   Patient Name: Jordan [name]       Date: 01/03/2023 DOB: 1918/05/06  Age: 89 y.o. MRN#: [id number] Attending Physician: Joycelyn Das, MD Primary Care Physician: Garlan Fillers, MD Admit Date: 12/29/2022  Reason for Consultation/Follow-up: Disposition, Non pain symptom management, Pain control, Psychosocial/spiritual support, and Terminal Care  Subjective: I have reviewed medical records including EPIC notes, MAR, and labs. Per notes, TOC sent referral to [facility] this morning. Received report from primary RN - no acute concerns. RN reports [facility] has a bed available today and patient was accepted.  Went to visit patient at bedside - no family/visitors present. Patient was lying in bed - today he is unresponsive to voice/gentle touch. Extremities are warm. No signs or non-verbal gestures of pain or discomfort noted. No respiratory distress, increased work of breathing, or secretions noted.   10:28 AM Called son/Jordan [name] - emotional support provided. He confirms [facility] has an available bed and they are working with [facility] staff on paperwork - patient will likely be transferred today. They have no further concerns or questions - they express appreciation for PMT assistance.  All questions and concerns addressed. Encouraged to call with questions and/or concerns. PMT card previously provided.  Length of Stay: 5  Current Medications: Scheduled Meds:   antiseptic oral rinse  15 mL Topical BID    Continuous Infusions:   PRN Meds: albuterol, diphenhydrAMINE, feeding supplement, glycopyrrolate **OR** glycopyrrolate **OR** glycopyrrolate, haloperidol **OR** haloperidol **OR** haloperidol lactate, morphine injection, ondansetron **OR** ondansetron (ZOFRAN)  IV, polyethylene glycol, polyvinyl alcohol  Physical Exam Vitals and nursing note reviewed.  Constitutional:      General: He is not in acute distress.    Appearance: He is ill-appearing.  Pulmonary:     Effort: No respiratory distress.  Skin:    General: Skin is warm and dry.     Findings: Ecchymosis present.  Neurological:     Mental Status: He is unresponsive.     Motor: Weakness present.             Vital Signs: BP 125/79 (BP Location: Left Arm)   Pulse 90   Temp (!) 97.5 F (36.4 C) (Oral)   Resp 17   Ht  (1.575 m)   Wt 56.7 kg   SpO2 100%  BMI 22.86 kg/m  SpO2: SpO2: 100 % O2 Device: O2 Device: Nasal Cannula O2 Flow Rate: O2 Flow Rate (L/min): 3 L/min  Intake/output summary: No intake or output data in the 24 hours ending 01/03/23 1015 LBM: Last BM Date : 01/02/23 Baseline Weight: Weight: 56.7 kg Most recent weight: Weight: 56.7 kg       Palliative Assessment/Data: PPS 10%      Patient Active Problem List   Diagnosis Date Noted   Fall at home, initial encounter 12/29/2022   AKI (acute kidney injury) 12/29/2022   DNR (do not resuscitate) 12/29/2022   Pressure injury of skin 06/28/2021   Gross hematuria 06/22/2021   CKD (chronic kidney disease), stage III 06/22/2021   Complete heart block 09/21/2020   HTN (hypertension) 09/21/2020   Squamous cell carcinoma of skin of scalp 05/09/2020   Squamous cell cancer of multiple sites of skin of upper arm, right 05/09/2020   Cystoid macular edema of right eye 02/20/2020   Early stage nonexudative age-related macular degeneration of both eyes 02/20/2020   Macular pucker, right eye 02/20/2020   Syncope 09/24/2019   AAA (abdominal aortic aneurysm) without rupture 08/14/2015   Severe aortic valve stenosis 05/15/2015   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   Aortic stenosis, severe 05/03/2015   Chest wall pain 03/09/2015   Abdominal aneurysm without mention of rupture 02/24/2013    Cavitating mass in left lower lung lobe 02/10/2013   Mobitz type II atrioventricular block 03/09/2012   Effusion of olecranon bursa, left 01/28/2012   Aortic stenosis 05/06/2011   Pacemaker 10/29/2010   ABDOMINAL PAIN-LUQ 06/21/2008   PERSONAL HX COLONIC POLYPS 06/21/2008   Hyperlipemia, mixed 06/19/2008   Anemia 06/19/2008   Coronary atherosclerosis 06/19/2008    Palliative Care Assessment & Plan   Patient Profile: 89 y.o. male with past medical history of AAA, BPH, CAD s/p stent, pacemaker, HTN, HLD, L renal mass, and s/p TAVR, BCC of skin not on treatment presented to ED on 12/29/22 from home with complaints of chest pain s/p unwitnessed fall vs syncopal event. Patient was admitted on 12/29/2022 with fall at home, chest pain, AKI on CKD 3a, anemia, possible lung cancer, AAA.   Assessment: Principal Problem:   Fall at home, initial encounter Active Problems:   Hyperlipemia, mixed   Anemia   Coronary atherosclerosis   Pacemaker   Cavitating mass in left lower lung lobe   Chest wall pain   AAA (abdominal aortic aneurysm) without rupture   HTN (hypertension)   CKD (chronic kidney disease), stage III   AKI (acute kidney injury)   DNR (do not resuscitate)   Terminal care  Recommendations/Plan: Continue full comfort measures Continue DNR/DNI as previously documented Transfer to [facility] today  Continue current comfort focused medication regimen - no changes PMT will continue to follow and support holistically   Symptom Management Morphine PRN pain/dyspnea/increased work of breathing/RR>25 Tylenol PRN pain/fever Biotin twice daily Benadryl PRN itching Robinul PRN secretions Haldol PRN agitation/delirium Zofran PRN nausea/vomiting Liquifilm Tears PRN dry eye  Goals of Care and Additional Recommendations: Limitations on Scope of Treatment: Full Comfort Care  Code Status:    Code Status Orders  (From admission, onward)           Start     Ordered   12/30/22  1149  Do not attempt resuscitation (DNR)  Continuous       Question Answer Comment  If patient has no pulse and is not breathing Do Not Attempt  Resuscitation   If patient has a pulse and/or is breathing: Medical Treatment Goals COMFORT MEASURES: Keep clean/warm/dry, use medication by any route; positioning, wound care and other measures to relieve pain/suffering; use oxygen, suction/manual treatment of airway obstruction for comfort; do not transfer unless for comfort needs.   Consent: Discussion documented in EHR or advanced directives reviewed      12/30/22 1151           Code Status History     Date Active Date Inactive Code Status Order ID Comments User Context   12/29/2022 1014 12/30/2022 1151 DNR [id number]  Jonah Blue, MD ED   06/22/2021 2358 06/29/2021 1751 Full Code [id number]  Rometta Emery, MD Inpatient   09/24/2019 1819 09/25/2019 2242 Full Code [id number]  Bridget Hartshorn, DO ED   05/15/2015 1429 05/19/2015 1712 Full Code [id number]  Kathleene Hazel, MD Inpatient   04/11/2015 1001 04/11/2015 1859 Full Code [id number]  Kathleene Hazel, MD Inpatient   03/09/2015 1921 03/12/2015 1722 Full Code [id number]  Penny Pia, MD Inpatient      Advance Directive Documentation    Flowsheet Row Most Recent Value  Type of Advance Directive Out of facility DNR (pink MOST or yellow form)  Pre-existing out of facility DNR order (yellow form or pink MOST form) --  "MOST" Form in Place? --       Prognosis:  Days - <2 weeks  Discharge Planning: Hospice facility  Care plan was discussed with primary RN, patient's son  Thank you for allowing the Palliative Medicine Team to assist in the care of this patient.   Haskel Khan, NP  Please contact Palliative Medicine Team phone at [phone] for questions and concerns.   *Portions of this note are a verbal dictation therefore any spelling and/or grammatical errors are due to the "[employer]" system  interpretation.

## 2023-01-03 NOTE — TOC Transition Note (Signed)
Transition of Care [facility]) - CM/SW Discharge Note   Patient Details  Name: ANA LIAW MRN: [id number] Date of Birth: [birth year]  Transition of Care [facility]) CM/SW Contact:  Deatra Robinson, [place] Phone Number: 01/03/2023, 1:32 PM   Clinical Narrative: Pt has been accepted to [facility] and they are prepared to admit pt today. Per [facility] with [employer], consents have been completed by pt's son. RN provided with number for report and PTAR arranged for transport. SW signing off at dc.   Dellie Burns, MSW, LCSW [phone] (coverage)        Final next level of care: [facility] Barriers to Discharge: No Barriers Identified   Patient Goals and CMS Choice      Discharge Placement                  Patient to be transferred to facility by: PTAR Name of family member notified: [name]/son Patient and family notified of of transfer: 01/03/23  Discharge Plan and Services Additional resources added to the After Visit Summary for                                       Social Determinants of Health (SDOH) Interventions SDOH Screenings   Tobacco Use: Medium Risk (12/29/2022)     Readmission Risk Interventions     No data to display

## 2023-01-06 ENCOUNTER — Telehealth: Payer: Self-pay

## 2023-01-06 NOTE — Telephone Encounter (Signed)
Pt passed away 12/17/2022. All his upcoming remotes was canceled. He has been taken out of [facility]. He been marked deceased.

## 2023-01-14 DEATH — deceased

## 2023-03-10 ENCOUNTER — Ambulatory Visit: Payer: Medicare Other | Admitting: Cardiology
